# Patient Record
Sex: Male | Born: 1950
Health system: Southern US, Community
[De-identification: ages and names within clinical notes are randomized; demographics above are authoritative.]

## PROBLEM LIST (undated history)

## (undated) DIAGNOSIS — N200 Calculus of kidney: Secondary | ICD-10-CM

## (undated) DIAGNOSIS — I10 Essential (primary) hypertension: Secondary | ICD-10-CM

## (undated) DIAGNOSIS — Z87442 Personal history of urinary calculi: Secondary | ICD-10-CM

## (undated) DIAGNOSIS — B269 Mumps without complication: Secondary | ICD-10-CM

## (undated) DIAGNOSIS — B059 Measles without complication: Secondary | ICD-10-CM

## (undated) DIAGNOSIS — B019 Varicella without complication: Secondary | ICD-10-CM

## (undated) DIAGNOSIS — E119 Type 2 diabetes mellitus without complications: Secondary | ICD-10-CM

## (undated) DIAGNOSIS — I639 Cerebral infarction, unspecified: Secondary | ICD-10-CM

## (undated) HISTORY — DX: Measles without complication: B05.9

## (undated) HISTORY — PX: WISDOM TOOTH EXTRACTION: SHX21

## (undated) HISTORY — DX: Mumps without complication: B26.9

## (undated) HISTORY — DX: Varicella without complication: B01.9

## (undated) HISTORY — DX: Calculus of kidney: N20.0

## (undated) HISTORY — PX: COLONOSCOPY W/ POLYPECTOMY: SHX1380

---

## 2007-04-03 LAB — HM COLONOSCOPY: HM Colonoscopy: NORMAL

## 2013-10-16 ENCOUNTER — Ambulatory Visit (INDEPENDENT_AMBULATORY_CARE_PROVIDER_SITE_OTHER): Payer: BC Managed Care – PPO | Admitting: Physician Assistant

## 2013-10-16 ENCOUNTER — Encounter: Payer: Self-pay | Admitting: Physician Assistant

## 2013-10-16 VITALS — BP 138/100 | HR 75 | Temp 98.5°F | Resp 16 | Ht 73.5 in | Wt 219.2 lb

## 2013-10-16 DIAGNOSIS — Z23 Encounter for immunization: Secondary | ICD-10-CM | POA: Diagnosis not present

## 2013-10-16 DIAGNOSIS — N529 Male erectile dysfunction, unspecified: Secondary | ICD-10-CM

## 2013-10-16 DIAGNOSIS — Z0000 Encounter for general adult medical examination without abnormal findings: Secondary | ICD-10-CM

## 2013-10-16 LAB — LIPID PANEL
CHOL/HDL RATIO: 5.5 ratio
CHOLESTEROL: 210 mg/dL — AB (ref 0–200)
HDL: 38 mg/dL — AB (ref >39)
LDL Cholesterol: 124 mg/dL — ABNORMAL HIGH (ref 0–99)
Triglycerides: 239 mg/dL — ABNORMAL HIGH (ref <150)
VLDL: 48 mg/dL — AB (ref 0–40)

## 2013-10-16 LAB — BASIC METABOLIC PANEL WITH GFR
BUN: 22 mg/dL (ref 6–23)
CO2: 28 meq/L (ref 19–32)
Calcium: 9.4 mg/dL (ref 8.4–10.5)
Chloride: 103 meq/L (ref 96–112)
Creat: 1.13 mg/dL (ref 0.50–1.35)
Glucose, Bld: 102 mg/dL — ABNORMAL HIGH (ref 70–99)
Potassium: 4.5 meq/L (ref 3.5–5.3)
Sodium: 136 meq/L (ref 135–145)

## 2013-10-16 LAB — HEPATIC FUNCTION PANEL
ALBUMIN: 4.4 g/dL (ref 3.5–5.2)
ALT: 42 U/L (ref 0–53)
AST: 24 U/L (ref 0–37)
Alkaline Phosphatase: 79 U/L (ref 39–117)
BILIRUBIN DIRECT: 0.1 mg/dL (ref 0.0–0.3)
Indirect Bilirubin: 0.8 mg/dL (ref 0.2–1.2)
Total Bilirubin: 0.9 mg/dL (ref 0.2–1.2)
Total Protein: 6.9 g/dL (ref 6.0–8.3)

## 2013-10-16 LAB — TSH: TSH: 2.155 u[IU]/mL (ref 0.350–4.500)

## 2013-10-16 LAB — CBC
HCT: 43.8 % (ref 39.0–52.0)
Hemoglobin: 15.3 g/dL (ref 13.0–17.0)
MCH: 31.1 pg (ref 26.0–34.0)
MCHC: 34.9 g/dL (ref 30.0–36.0)
MCV: 89 fL (ref 78.0–100.0)
Platelets: 218 10*3/uL (ref 150–400)
RBC: 4.92 MIL/uL (ref 4.22–5.81)
RDW: 14.4 % (ref 11.5–15.5)
WBC: 6.5 10*3/uL (ref 4.0–10.5)

## 2013-10-16 LAB — HEMOGLOBIN A1C
Hgb A1c MFr Bld: 5.9 % — ABNORMAL HIGH (ref <5.7)
Mean Plasma Glucose: 123 mg/dL — ABNORMAL HIGH (ref <117)

## 2013-10-16 NOTE — Progress Notes (Signed)
Patient presents to clinic today to establish care. Patient requesting CPE.  Is fasting for labs.  Acute Concerns: Endorses difficulty maintaining erection that has come on gradually over the past several years.  Denies hx of hyperlipidemia or diabetes mellitus.  Denies hx of contracture or Peyronie disease.  Denies loss of libido.  Denies urinary hesitancy, dribbling or incomplete bladder emptying.  Has never been treated for this in the past.  Chronic Issues: Denies known significant PMH  Health Maintenance: Dental -- up-to-date Vision -- overdue Immunizations --  Tetanus overdue.  Will be getting immunization today. Colonoscopy -- 2007; no abnormal findings.  Due in 2017.   Past Medical History  Diagnosis Date  . Chicken pox   . Measles   . Mumps   . Kidney stones     5-6 times    Past Surgical History  Procedure Laterality Date  . Wisdom tooth extraction  1976-77    No current outpatient prescriptions on file prior to visit.   No current facility-administered medications on file prior to visit.    Allergies  Allergen Reactions  . Bee Venom     Lip swelling    Family History  Problem Relation Age of Onset  . Alcohol abuse Father 2    Deceased  . Heart disease Father   . Lung disease Father   . Stomach cancer Mother 69    Deceased  . Hypertension Father   . Hypertension Paternal Grandmother   . Dementia Paternal Grandmother   . Arthritis Maternal Grandmother   . Stroke Paternal Grandmother   . Sudden death Paternal Grandfather   . Sudden death Maternal Grandfather   . Heart attack Paternal Aunt   . Other Maternal Aunt     Natural Causes  . Healthy Son   . Anxiety disorder Daughter     History   Social History  . Marital Status: Married    Spouse Name: N/A    Number of Children: N/A  . Years of Education: N/A   Occupational History  . Not on file.   Social History Main Topics  . Smoking status: Never Smoker   . Smokeless tobacco: Never Used   . Alcohol Use: No  . Drug Use: No  . Sexual Activity: Yes   Other Topics Concern  . Not on file   Social History Narrative  . No narrative on file   Review of Systems  Constitutional: Negative for fever and weight loss.  HENT: Negative for ear discharge, ear pain, hearing loss and tinnitus.   Eyes: Negative for blurred vision, double vision, photophobia and pain.  Respiratory: Negative for cough and shortness of breath.   Cardiovascular: Negative for chest pain and palpitations.  Gastrointestinal: Negative for heartburn, nausea, vomiting, abdominal pain, diarrhea, constipation, blood in stool and melena.  Genitourinary: Negative for dysuria, urgency, frequency, hematuria and flank pain.       Nocturia x 1.  Neurological: Negative for dizziness, loss of consciousness and headaches.  Endo/Heme/Allergies: Negative for environmental allergies.  Psychiatric/Behavioral: Negative for depression, suicidal ideas, hallucinations and substance abuse. The patient is not nervous/anxious and does not have insomnia.    BP 138/100  Pulse 75  Temp(Src) 98.5 F (36.9 C) (Oral)  Resp 16  Ht 6' 1.5" (1.867 m)  Wt 219 lb 4 oz (99.451 kg)  BMI 28.53 kg/m2  SpO2 98%  Physical Exam  Vitals reviewed. Constitutional: He is oriented to person, place, and time and well-developed, well-nourished, and in no distress.  HENT:  Head: Normocephalic and atraumatic.  Right Ear: External ear normal.  Left Ear: External ear normal.  Nose: Nose normal.  Mouth/Throat: Oropharynx is clear and moist. No oropharyngeal exudate.  TM within normal limits bilaterally.  Eyes: Conjunctivae are normal. Pupils are equal, round, and reactive to light.  Neck: Neck supple.  Cardiovascular: Normal rate, regular rhythm, normal heart sounds and intact distal pulses.   Pulmonary/Chest: Effort normal and breath sounds normal. No respiratory distress. He has no wheezes. He has no rales. He exhibits no tenderness.  Abdominal:  Soft. Bowel sounds are normal. He exhibits no distension and no mass. There is no tenderness. There is no rebound and no guarding.  Genitourinary: Penis normal.  Neurological: He is alert and oriented to person, place, and time.  Skin: Skin is warm and dry. No rash noted.  Psychiatric: Affect normal.   Assessment/Plan: Visit for preventive health examination Medical history reviewed.  Tetanus updated.  Will obtain fasting lab work.  Need for prophylactic vaccination with combined diphtheria-tetanus-pertussis (DTP) vaccine Immunization given by nursing staff.  Erectile dysfunction Will obtain labs to include BMP, PSA, A1C, Lipid panel and testosterone level.  Patient wishes to defer treatment until workup is complete.

## 2013-10-16 NOTE — Assessment & Plan Note (Signed)
Immunization given by nursing staff. 

## 2013-10-16 NOTE — Assessment & Plan Note (Signed)
Medical history reviewed.  Tetanus updated.  Will obtain fasting lab work.

## 2013-10-16 NOTE — Progress Notes (Signed)
Pre visit review using our clinic review tool, if applicable. No additional management support is needed unless otherwise documented below in the visit note/SLS  

## 2013-10-16 NOTE — Assessment & Plan Note (Signed)
Will obtain labs to include BMP, PSA, A1C, Lipid panel and testosterone level.  Patient wishes to defer treatment until workup is complete.

## 2013-10-16 NOTE — Patient Instructions (Addendum)
Please obtain labs.  I will call you with your results.  We will treat you according to lab results.  Please continue exercise.  Try to eat a well-balanced diet.  Please read information below on Preventive Care for Men.  Also read information below on the DASH diet which will help blood pressure, as your BP is elevated in clinic today.  This could be due to anxiety of the first visit, but we will need to monitor this.  Recommend you come back in to office in 1 month for a BP recheck with the nurse.   Preventive Care for Adults A healthy lifestyle and preventive care can promote health and wellness. Preventive health guidelines for men include the following key practices:  A routine yearly physical is a good way to check with your health care provider about your health and preventative screening. It is a chance to share any concerns and updates on your health and to receive a thorough exam.  Visit your dentist for a routine exam and preventative care every 6 months. Brush your teeth twice a day and floss once a day. Good oral hygiene prevents tooth decay and gum disease.  The frequency of eye exams is based on your age, health, family medical history, use of contact lenses, and other factors. Follow your health care provider's recommendations for frequency of eye exams.  Eat a healthy diet. Foods such as vegetables, fruits, whole grains, low-fat dairy products, and lean protein foods contain the nutrients you need without too many calories. Decrease your intake of foods high in solid fats, added sugars, and salt. Eat the right amount of calories for you.Get information about a proper diet from your health care provider, if necessary.  Regular physical exercise is one of the most important things you can do for your health. Most adults should get at least 150 minutes of moderate-intensity exercise (any activity that increases your heart rate and causes you to sweat) each week. In addition, most adults  need muscle-strengthening exercises on 2 or more days a week.  Maintain a healthy weight. The body mass index (BMI) is a screening tool to identify possible weight problems. It provides an estimate of body fat based on height and weight. Your health care provider can find your BMI and can help you achieve or maintain a healthy weight.For adults 20 years and older:  A BMI below 18.5 is considered underweight.  A BMI of 18.5 to 24.9 is normal.  A BMI of 25 to 29.9 is considered overweight.  A BMI of 30 and above is considered obese.  Maintain normal blood lipids and cholesterol levels by exercising and minimizing your intake of saturated fat. Eat a balanced diet with plenty of fruit and vegetables. Blood tests for lipids and cholesterol should begin at age 6 and be repeated every 5 years. If your lipid or cholesterol levels are high, you are over 50, or you are at high risk for heart disease, you may need your cholesterol levels checked more frequently.Ongoing high lipid and cholesterol levels should be treated with medicines if diet and exercise are not working.  If you smoke, find out from your health care provider how to quit. If you do not use tobacco, do not start.  Lung cancer screening is recommended for adults aged 53-80 years who are at high risk for developing lung cancer because of a history of smoking. A yearly low-dose CT scan of the lungs is recommended for people who have at least a  30-pack-year history of smoking and are a current smoker or have quit within the past 15 years. A pack year of smoking is smoking an average of 1 pack of cigarettes a day for 1 year (for example: 1 pack a day for 30 years or 2 packs a day for 15 years). Yearly screening should continue until the smoker has stopped smoking for at least 15 years. Yearly screening should be stopped for people who develop a health problem that would prevent them from having lung cancer treatment.  If you choose to drink  alcohol, do not have more than 2 drinks per day. One drink is considered to be 12 ounces (355 mL) of beer, 5 ounces (148 mL) of wine, or 1.5 ounces (44 mL) of liquor.  Avoid use of street drugs. Do not share needles with anyone. Ask for help if you need support or instructions about stopping the use of drugs.  High blood pressure causes heart disease and increases the risk of stroke. Your blood pressure should be checked at least every 1-2 years. Ongoing high blood pressure should be treated with medicines, if weight loss and exercise are not effective.  If you are 61-27 years old, ask your health care provider if you should take aspirin to prevent heart disease.  Diabetes screening involves taking a blood sample to check your fasting blood sugar level. This should be done once every 3 years, after age 59, if you are within normal weight and without risk factors for diabetes. Testing should be considered at a younger age or be carried out more frequently if you are overweight and have at least 1 risk factor for diabetes.  Colorectal cancer can be detected and often prevented. Most routine colorectal cancer screening begins at the age of 64 and continues through age 56. However, your health care provider may recommend screening at an earlier age if you have risk factors for colon cancer. On a yearly basis, your health care provider may provide home test kits to check for hidden blood in the stool. Use of a small camera at the end of a tube to directly examine the colon (sigmoidoscopy or colonoscopy) can detect the earliest forms of colorectal cancer. Talk to your health care provider about this at age 32, when routine screening begins. Direct exam of the colon should be repeated every 5-10 years through age 22, unless early forms of precancerous polyps or small growths are found.  People who are at an increased risk for hepatitis B should be screened for this virus. You are considered at high risk for  hepatitis B if:  You were born in a country where hepatitis B occurs often. Talk with your health care provider about which countries are considered high risk.  Your parents were born in a high-risk country and you have not received a shot to protect against hepatitis B (hepatitis B vaccine).  You have HIV or AIDS.  You use needles to inject street drugs.  You live with, or have sex with, someone who has hepatitis B.  You are a man who has sex with other men (MSM).  You get hemodialysis treatment.  You take certain medicines for conditions such as cancer, organ transplantation, and autoimmune conditions.  Hepatitis C blood testing is recommended for all people born from [birth year] through 1965 and any individual with known risks for hepatitis C.  Practice safe sex. Use condoms and avoid high-risk sexual practices to reduce the spread of sexually transmitted infections (STIs). STIs include  gonorrhea, chlamydia, syphilis, trichomonas, herpes, HPV, and human immunodeficiency virus (HIV). Herpes, HIV, and HPV are viral illnesses that have no cure. They can result in disability, cancer, and death.  If you are at risk of being infected with HIV, it is recommended that you take a prescription medicine daily to prevent HIV infection. This is called preexposure prophylaxis (PrEP). You are considered at risk if:  You are a man who has sex with other men (MSM) and have other risk factors.  You are a heterosexual man, are sexually active, and are at increased risk for HIV infection.  You take drugs by injection.  You are sexually active with a partner who has HIV.  Talk with your health care provider about whether you are at high risk of being infected with HIV. If you choose to begin PrEP, you should first be tested for HIV. You should then be tested every 3 months for as long as you are taking PrEP.  A one-time screening for abdominal aortic aneurysm (AAA) and surgical repair of large AAAs by  ultrasound are recommended for men ages 43 to 67 years who are current or former smokers.  Healthy men should no longer receive prostate-specific antigen (PSA) blood tests as part of routine cancer screening. Talk with your health care provider about prostate cancer screening.  Testicular cancer screening is not recommended for adult males who have no symptoms. Screening includes self-exam, a health care provider exam, and other screening tests. Consult with your health care provider about any symptoms you have or any concerns you have about testicular cancer.  Use sunscreen. Apply sunscreen liberally and repeatedly throughout the day. You should seek shade when your shadow is shorter than you. Protect yourself by wearing long sleeves, pants, a wide-brimmed hat, and sunglasses year round, whenever you are outdoors.  Once a month, do a whole-body skin exam, using a mirror to look at the skin on your back. Tell your health care provider about new moles, moles that have irregular borders, moles that are larger than a pencil eraser, or moles that have changed in shape or color.  Stay current with required vaccines (immunizations).  Influenza vaccine. All adults should be immunized every year.  Tetanus, diphtheria, and acellular pertussis (Td, Tdap) vaccine. An adult who has not previously received Tdap or who does not know his vaccine status should receive 1 dose of Tdap. This initial dose should be followed by tetanus and diphtheria toxoids (Td) booster doses every 10 years. Adults with an unknown or incomplete history of completing a 3-dose immunization series with Td-containing vaccines should begin or complete a primary immunization series including a Tdap dose. Adults should receive a Td booster every 10 years.  Varicella vaccine. An adult without evidence of immunity to varicella should receive 2 doses or a second dose if he has previously received 1 dose.  Human papillomavirus (HPV) vaccine.  Males aged 4-21 years who have not received the vaccine previously should receive the 3-dose series. Males aged 22-26 years may be immunized. Immunization is recommended through the age of 48 years for any male who has sex with males and did not get any or all doses earlier. Immunization is recommended for any person with an immunocompromised condition through the age of 24 years if he did not get any or all doses earlier. During the 3-dose series, the second dose should be obtained 4-8 weeks after the first dose. The third dose should be obtained 24 weeks after the first dose and  16 weeks after the second dose.  Zoster vaccine. One dose is recommended for adults aged 24 years or older unless certain conditions are present.  Measles, mumps, and rubella (MMR) vaccine. Adults born before [birth year] generally are considered immune to measles and mumps. Adults born in [birth year] or later should have 1 or more doses of MMR vaccine unless there is a contraindication to the vaccine or there is laboratory evidence of immunity to each of the three diseases. A routine second dose of MMR vaccine should be obtained at least 28 days after the first dose for students attending postsecondary schools, health care workers, or international travelers. People who received inactivated measles vaccine or an unknown type of measles vaccine during 1963-1967 should receive 2 doses of MMR vaccine. People who received inactivated mumps vaccine or an unknown type of mumps vaccine before 1979 and are at high risk for mumps infection should consider immunization with 2 doses of MMR vaccine. Unvaccinated health care workers born before [birth year] who lack laboratory evidence of measles, mumps, or rubella immunity or laboratory confirmation of disease should consider measles and mumps immunization with 2 doses of MMR vaccine or rubella immunization with 1 dose of MMR vaccine.  Pneumococcal 13-valent conjugate (PCV13) vaccine. When indicated, a person who is  uncertain of his immunization history and has no record of immunization should receive the PCV13 vaccine. An adult aged 24 years or older who has certain medical conditions and has not been previously immunized should receive 1 dose of PCV13 vaccine. This PCV13 should be followed with a dose of pneumococcal polysaccharide (PPSV23) vaccine. The PPSV23 vaccine dose should be obtained at least 8 weeks after the dose of PCV13 vaccine. An adult aged 47 years or older who has certain medical conditions and previously received 1 or more doses of PPSV23 vaccine should receive 1 dose of PCV13. The PCV13 vaccine dose should be obtained 1 or more years after the last PPSV23 vaccine dose.  Pneumococcal polysaccharide (PPSV23) vaccine. When PCV13 is also indicated, PCV13 should be obtained first. All adults aged 44 years and older should be immunized. An adult younger than age 89 years who has certain medical conditions should be immunized. Any person who resides in a nursing home or long-term care facility should be immunized. An adult smoker should be immunized. People with an immunocompromised condition and certain other conditions should receive both PCV13 and PPSV23 vaccines. People with human immunodeficiency virus (HIV) infection should be immunized as soon as possible after diagnosis. Immunization during chemotherapy or radiation therapy should be avoided. Routine use of PPSV23 vaccine is not recommended for American Indians, [place] Natives, or people younger than 65 years unless there are medical conditions that require PPSV23 vaccine. When indicated, people who have unknown immunization and have no record of immunization should receive PPSV23 vaccine. One-time revaccination 5 years after the first dose of PPSV23 is recommended for people aged 19-64 years who have chronic kidney failure, nephrotic syndrome, asplenia, or immunocompromised conditions. People who received 1-2 doses of PPSV23 before age 63 years should  receive another dose of PPSV23 vaccine at age 11 years or later if at least 5 years have passed since the previous dose. Doses of PPSV23 are not needed for people immunized with PPSV23 at or after age 3 years.  Meningococcal vaccine. Adults with asplenia or persistent complement component deficiencies should receive 2 doses of quadrivalent meningococcal conjugate (MenACWY-D) vaccine. The doses should be obtained at least 2 months apart. Microbiologists working with certain meningococcal bacteria,  [employer] recruits, people at risk during an outbreak, and people who travel to or live in countries with a high rate of meningitis should be immunized. A first-year college student up through age 20 years who is living in a residence hall should receive a dose if he did not receive a dose on or after his 16th birthday. Adults who have certain high-risk conditions should receive one or more doses of vaccine.  Hepatitis A vaccine. Adults who wish to be protected from this disease, have certain high-risk conditions, work with hepatitis A-infected animals, work in hepatitis A research labs, or travel to or work in countries with a high rate of hepatitis A should be immunized. Adults who were previously unvaccinated and who anticipate close contact with an international adoptee during the first 60 days after arrival in the [place] from a country with a high rate of hepatitis A should be immunized.  Hepatitis B vaccine. Adults should be immunized if they wish to be protected from this disease, have certain high-risk conditions, may be exposed to blood or other infectious body fluids, are household contacts or sex partners of hepatitis B positive people, are clients or workers in certain care facilities, or travel to or work in countries with a high rate of hepatitis B.  Haemophilus influenzae type b (Hib) vaccine. A previously unvaccinated person with asplenia or sickle cell disease or having a scheduled  splenectomy should receive 1 dose of Hib vaccine. Regardless of previous immunization, a recipient of a hematopoietic stem cell transplant should receive a 3-dose series 6-12 months after his successful transplant. Hib vaccine is not recommended for adults with HIV infection. Preventive Service / Frequency Ages 55 to 89  Blood pressure check.** / Every 1 to 2 years.  Lipid and cholesterol check.** / Every 5 years beginning at age 60.  Hepatitis C blood test.** / For any individual with known risks for hepatitis C.  Skin self-exam. / Monthly.  Influenza vaccine. / Every year.  Tetanus, diphtheria, and acellular pertussis (Tdap, Td) vaccine.** / Consult your health care provider. 1 dose of Td every 10 years.  Varicella vaccine.** / Consult your health care provider.  HPV vaccine. / 3 doses over 6 months, if 72 or younger.  Measles, mumps, rubella (MMR) vaccine.** / You need at least 1 dose of MMR if you were born in 1957 or later. You may also need a second dose.  Pneumococcal 13-valent conjugate (PCV13) vaccine.** / Consult your health care provider.  Pneumococcal polysaccharide (PPSV23) vaccine.** / 1 to 2 doses if you smoke cigarettes or if you have certain conditions.  Meningococcal vaccine.** / 1 dose if you are age 85 to 88 years and a Market researcher living in a residence hall, or have one of several medical conditions. You may also need additional booster doses.  Hepatitis A vaccine.** / Consult your health care provider.  Hepatitis B vaccine.** / Consult your health care provider.  Haemophilus influenzae type b (Hib) vaccine.** / Consult your health care provider. Ages 76 to 40  Blood pressure check.** / Every 1 to 2 years.  Lipid and cholesterol check.** / Every 5 years beginning at age 19.  Lung cancer screening. / Every year if you are aged 73-80 years and have a 30-pack-year history of smoking and currently smoke or have quit within the past 15 years.  Yearly screening is stopped once you have quit smoking for at least 15 years or develop a health problem that would prevent you from  having lung cancer treatment.  Fecal occult blood test (FOBT) of stool. / Every year beginning at age 56 and continuing until age 53. You may not have to do this test if you get a colonoscopy every 10 years.  Flexible sigmoidoscopy** or colonoscopy.** / Every 5 years for a flexible sigmoidoscopy or every 10 years for a colonoscopy beginning at age 61 and continuing until age 58.  Hepatitis C blood test.** / For all people born from [birth year] through 1964 and any individual with known risks for hepatitis C.  Skin self-exam. / Monthly.  Influenza vaccine. / Every year.  Tetanus, diphtheria, and acellular pertussis (Tdap/Td) vaccine.** / Consult your health care provider. 1 dose of Td every 10 years.  Varicella vaccine.** / Consult your health care provider.  Zoster vaccine.** / 1 dose for adults aged 45 years or older.  Measles, mumps, rubella (MMR) vaccine.** / You need at least 1 dose of MMR if you were born in 1956 or later. You may also need a second dose.  Pneumococcal 13-valent conjugate (PCV13) vaccine.** / Consult your health care provider.  Pneumococcal polysaccharide (PPSV23) vaccine.** / 1 to 2 doses if you smoke cigarettes or if you have certain conditions.  Meningococcal vaccine.** / Consult your health care provider.  Hepatitis A vaccine.** / Consult your health care provider.  Hepatitis B vaccine.** / Consult your health care provider.  Haemophilus influenzae type b (Hib) vaccine.** / Consult your health care provider. Ages 52 and over  Blood pressure check.** / Every 1 to 2 years.  Lipid and cholesterol check.**/ Every 5 years beginning at age 67.  Lung cancer screening. / Every year if you are aged 36-80 years and have a 30-pack-year history of smoking and currently smoke or have quit within the past 15 years. Yearly screening is stopped  once you have quit smoking for at least 15 years or develop a health problem that would prevent you from having lung cancer treatment.  Fecal occult blood test (FOBT) of stool. / Every year beginning at age 27 and continuing until age 23. You may not have to do this test if you get a colonoscopy every 10 years.  Flexible sigmoidoscopy** or colonoscopy.** / Every 5 years for a flexible sigmoidoscopy or every 10 years for a colonoscopy beginning at age 50 and continuing until age 64.  Hepatitis C blood test.** / For all people born from [birth year] through 1964 and any individual with known risks for hepatitis C.  Abdominal aortic aneurysm (AAA) screening.** / A one-time screening for ages 53 to 2 years who are current or former smokers.  Skin self-exam. / Monthly.  Influenza vaccine. / Every year.  Tetanus, diphtheria, and acellular pertussis (Tdap/Td) vaccine.** / 1 dose of Td every 10 years.  Varicella vaccine.** / Consult your health care provider.  Zoster vaccine.** / 1 dose for adults aged 36 years or older.  Pneumococcal 13-valent conjugate (PCV13) vaccine.** / Consult your health care provider.  Pneumococcal polysaccharide (PPSV23) vaccine.** / 1 dose for all adults aged 66 years and older.  Meningococcal vaccine.** / Consult your health care provider.  Hepatitis A vaccine.** / Consult your health care provider.  Hepatitis B vaccine.** / Consult your health care provider.  Haemophilus influenzae type b (Hib) vaccine.** / Consult your health care provider. **Family history and personal history of risk and conditions may change your health care provider's recommendations. Document Released: 05/15/2001 Document Revised: 03/24/2013 Document Reviewed: 08/14/2010 [facility] Patient Information 2015 [place], [place]. This information is not  intended to replace advice given to you by your health care provider. Make sure you discuss any questions you have with your health care provider.  DASH  Eating Plan DASH stands for "Dietary Approaches to Stop Hypertension." The DASH eating plan is a healthy eating plan that has been shown to reduce high blood pressure (hypertension). Additional health benefits may include reducing the risk of type 2 diabetes mellitus, heart disease, and stroke. The DASH eating plan may also help with weight loss. WHAT DO I NEED TO KNOW ABOUT THE DASH EATING PLAN? For the DASH eating plan, you will follow these general guidelines:  Choose foods with a percent daily value for sodium of less than 5% (as listed on the food label).  Use salt-free seasonings or herbs instead of table salt or sea salt.  Check with your health care provider or pharmacist before using salt substitutes.  Eat lower-sodium products, often labeled as "lower sodium" or "no salt added."  Eat fresh foods.  Eat more vegetables, fruits, and low-fat dairy products.  Choose whole grains. Look for the word "whole" as the first word in the ingredient list.  Choose fish and skinless chicken or [place] more often than red meat. Limit fish, poultry, and meat to 6 oz (170 g) each day.  Limit sweets, desserts, sugars, and sugary drinks.  Choose heart-healthy fats.  Limit cheese to 1 oz (28 g) per day.  Eat more home-cooked food and less restaurant, buffet, and fast food.  Limit fried foods.  Cook foods using methods other than frying.  Limit canned vegetables. If you do use them, rinse them well to decrease the sodium.  When eating at a restaurant, ask that your food be prepared with less salt, or no salt if possible. WHAT FOODS CAN I EAT? Seek help from a dietitian for individual calorie needs. Grains Whole grain or whole wheat bread. Brown rice. Whole grain or whole wheat pasta. Quinoa, bulgur, and whole grain cereals. Low-sodium cereals. Corn or whole wheat flour tortillas. Whole grain cornbread. Whole grain crackers. Low-sodium crackers. Vegetables Fresh or frozen vegetables (raw,  steamed, roasted, or grilled). Low-sodium or reduced-sodium tomato and vegetable juices. Low-sodium or reduced-sodium tomato sauce and paste. Low-sodium or reduced-sodium canned vegetables.  Fruits All fresh, canned (in natural juice), or frozen fruits. Meat and Other Protein Products Ground beef (85% or leaner), grass-fed beef, or beef trimmed of fat. Skinless chicken or [place]. Ground chicken or [place]. Pork trimmed of fat. All fish and seafood. Eggs. Dried beans, peas, or lentils. Unsalted nuts and seeds. Unsalted canned beans. Dairy Low-fat dairy products, such as skim or 1% milk, 2% or reduced-fat cheeses, low-fat ricotta or cottage cheese, or plain low-fat yogurt. Low-sodium or reduced-sodium cheeses. Fats and Oils Tub margarines without trans fats. Light or reduced-fat mayonnaise and salad dressings (reduced sodium). Avocado. Safflower, olive, or canola oils. Natural peanut or almond butter. Other Unsalted popcorn and pretzels. The items listed above may not be a complete list of recommended foods or beverages. Contact your dietitian for more options. WHAT FOODS ARE NOT RECOMMENDED? Grains White bread. White pasta. White rice. Refined cornbread. Bagels and croissants. Crackers that contain trans fat. Vegetables Creamed or fried vegetables. Vegetables in a cheese sauce. Regular canned vegetables. Regular canned tomato sauce and paste. Regular tomato and vegetable juices. Fruits Dried fruits. Canned fruit in light or heavy syrup. Fruit juice. Meat and Other Protein Products Fatty cuts of meat. Ribs, chicken wings, bacon, sausage, bologna, salami, chitterlings, fatback, hot dogs, bratwurst,  and packaged luncheon meats. Salted nuts and seeds. Canned beans with salt. Dairy Whole or 2% milk, cream, half-and-half, and cream cheese. Whole-fat or sweetened yogurt. Full-fat cheeses or blue cheese. Nondairy creamers and whipped toppings. Processed cheese, cheese spreads, or cheese  curds. Condiments Onion and garlic salt, seasoned salt, table salt, and sea salt. Canned and packaged gravies. Worcestershire sauce. Tartar sauce. Barbecue sauce. Teriyaki sauce. Soy sauce, including reduced sodium. Steak sauce. Fish sauce. Oyster sauce. Cocktail sauce. Horseradish. Ketchup and mustard. Meat flavorings and tenderizers. Bouillon cubes. Hot sauce. Tabasco sauce. Marinades. Taco seasonings. Relishes. Fats and Oils Butter, stick margarine, lard, shortening, ghee, and bacon fat. Coconut, palm kernel, or palm oils. Regular salad dressings. Other Pickles and olives. Salted popcorn and pretzels. The items listed above may not be a complete list of foods and beverages to avoid. Contact your dietitian for more information. WHERE CAN I FIND MORE INFORMATION? National Heart, Lung, and Blood Institute: travelstabloid.com Document Released: 03/08/2011 Document Revised: 03/24/2013 Document Reviewed: 01/21/2013 [facility] Patient Information 2015 [place], [place]. This information is not intended to replace advice given to you by your health care provider. Make sure you discuss any questions you have with your health care provider.

## 2013-10-17 LAB — URINALYSIS, ROUTINE W REFLEX MICROSCOPIC
Bilirubin Urine: NEGATIVE
GLUCOSE, UA: NEGATIVE mg/dL
Hgb urine dipstick: NEGATIVE
Ketones, ur: NEGATIVE mg/dL
LEUKOCYTES UA: NEGATIVE
Nitrite: NEGATIVE
PROTEIN: NEGATIVE mg/dL
Urobilinogen, UA: 0.2 mg/dL (ref 0.0–1.0)
pH: 5 (ref 5.0–8.0)

## 2013-10-17 LAB — PSA: PSA: 1.74 ng/mL (ref <=4.00)

## 2013-10-17 LAB — URINALYSIS, MICROSCOPIC ONLY
Bacteria, UA: NONE SEEN
Casts: NONE SEEN
Crystals: NONE SEEN
SQUAMOUS EPITHELIAL / LPF: NONE SEEN

## 2013-10-19 LAB — TESTOSTERONE, FREE, TOTAL, SHBG
Sex Hormone Binding: 23 nmol/L (ref 13–71)
TESTOSTERONE-% FREE: 2.3 % (ref 1.6–2.9)
Testosterone, Free: 50.9 pg/mL (ref 47.0–244.0)
Testosterone: 219 ng/dL — ABNORMAL LOW (ref 300–890)

## 2013-10-20 ENCOUNTER — Telehealth: Payer: Self-pay | Admitting: *Deleted

## 2013-10-20 DIAGNOSIS — E781 Pure hyperglyceridemia: Secondary | ICD-10-CM

## 2013-10-20 DIAGNOSIS — R7989 Other specified abnormal findings of blood chemistry: Secondary | ICD-10-CM

## 2013-10-20 NOTE — Telephone Encounter (Signed)
Message copied by Ronny Flurry on Tue Oct 20, 2013  5:12 PM ------      Message from: Raiford Noble      Created: Sun Oct 18, 2013  3:27 PM       Labs look good overall.  Still waiting on testosterone results.  His cholesterol is mildly elevated.  LDL is just slightly raised, not overly concerning, but his TGL are too high.  He needs to begin taking a fish oil supplement daily. Also needs to limit intake of foods high in cholesterol, saturated fats and refined sugars.  Decrease/limit alcohol intake.  Will recheck TGL levels in one month.  If they stay persistently elevated, he will need a Rx medication. ------

## 2013-10-20 NOTE — Telephone Encounter (Signed)
Notified pt and he voices understanding. Pt has nurse visit BP check on 11/18/13 and will repeat lipid panel at that time (order entered). Pt will return to lab in the am to repeat testosterone. Lab order entered.   Notes Recorded by Leeanne Rio, PA-C on 10/19/2013 at 11:40 AM Testosterone is low. Will need repeat specimen to confirm. Recommend he come to lab one AM (8AM is best) to recheck labs. May be contributing to his symptoms.

## 2013-10-22 LAB — TESTOSTERONE, FREE, TOTAL, SHBG
SEX HORMONE BINDING: 25 nmol/L (ref 13–71)
TESTOSTERONE: 216 ng/dL — AB (ref 300–890)
Testosterone, Free: 48.1 pg/mL (ref 47.0–244.0)
Testosterone-% Free: 2.2 % (ref 1.6–2.9)

## 2013-11-18 ENCOUNTER — Ambulatory Visit (INDEPENDENT_AMBULATORY_CARE_PROVIDER_SITE_OTHER): Payer: BC Managed Care – PPO | Admitting: *Deleted

## 2013-11-18 VITALS — BP 132/100

## 2013-11-18 DIAGNOSIS — IMO0001 Reserved for inherently not codable concepts without codable children: Secondary | ICD-10-CM

## 2013-11-18 DIAGNOSIS — R030 Elevated blood-pressure reading, without diagnosis of hypertension: Secondary | ICD-10-CM

## 2013-11-18 NOTE — Progress Notes (Signed)
   Subjective:    Patient ID: Micheal Oliver, male    DOB: 1950/10/28, 63 y.o.   MRN: [id number]  HPI    Review of Systems     Objective:   Physical Exam        Assessment & Plan:  Pt presented for one month F/U on Blood Pressure. Blood pressure was taken with large size cuff. Patient still has Hypertension level readings; numbers forwarded to provider. Provider instructions were 1) Start Lisinopril 10 mg daily 2) Start DASH Diet, strict adherence with added activity level daily and return OV in [1] month. Patient declines to start medication now, will use DASH diet guidelines and is leaving to go to [place] for two months next week, where he will have ample opportunity for activity [bike, etc] as opposed to not doing any here; he will schedule F/U OV for when he returns from [place] in [2] months to assess BP; provider informed/SLS

## 2014-01-29 ENCOUNTER — Ambulatory Visit (INDEPENDENT_AMBULATORY_CARE_PROVIDER_SITE_OTHER): Payer: BC Managed Care – PPO | Admitting: Physician Assistant

## 2014-01-29 ENCOUNTER — Encounter: Payer: Self-pay | Admitting: Physician Assistant

## 2014-01-29 ENCOUNTER — Ambulatory Visit (HOSPITAL_BASED_OUTPATIENT_CLINIC_OR_DEPARTMENT_OTHER)
Admission: RE | Admit: 2014-01-29 | Discharge: 2014-01-29 | Disposition: A | Discharge status: Home / Self Care | Payer: BC Managed Care – PPO | Source: Ambulatory Visit | Attending: Radiology | Admitting: Radiology

## 2014-01-29 VITALS — BP 165/99 | HR 70 | Temp 98.7°F | Resp 16 | Ht 73.5 in | Wt 218.1 lb

## 2014-01-29 DIAGNOSIS — I10 Essential (primary) hypertension: Secondary | ICD-10-CM

## 2014-01-29 DIAGNOSIS — K5900 Constipation, unspecified: Secondary | ICD-10-CM | POA: Insufficient documentation

## 2014-01-29 MED ORDER — LISINOPRIL 10 MG PO TABS: 10.0000 mg | ORAL_TABLET | Freq: Every day | ORAL | Status: DC

## 2014-01-29 NOTE — Patient Instructions (Signed)
Please go downstairs for x-ray of your abdomen.  This will help assess your stool burden and r/o kidney stone.  Increase your fluid intake.  Take a stool softener daily over the next week. Use Milk of Magnesia or Miralax over the next couple of days.  After that, only use if no bowel movement in 2 days.   For Blood pressure, please take the Lisinopril daily as directed.  Read information below on the DASH diet.  Follow-up with me in 2 weeks for a BP recheck.  DASH Eating Plan DASH stands for "Dietary Approaches to Stop Hypertension." The DASH eating plan is a healthy eating plan that has been shown to reduce high blood pressure (hypertension). Additional health benefits may include reducing the risk of type 2 diabetes mellitus, heart disease, and stroke. The DASH eating plan may also help with weight loss. WHAT DO I NEED TO KNOW ABOUT THE DASH EATING PLAN? For the DASH eating plan, you will follow these general guidelines:  Choose foods with a percent daily value for sodium of less than 5% (as listed on the food label).  Use salt-free seasonings or herbs instead of table salt or sea salt.  Check with your health care provider or pharmacist before using salt substitutes.  Eat lower-sodium products, often labeled as "lower sodium" or "no salt added."  Eat fresh foods.  Eat more vegetables, fruits, and low-fat dairy products.  Choose whole grains. Look for the word "whole" as the first word in the ingredient list.  Choose fish and skinless chicken or [place] more often than red meat. Limit fish, poultry, and meat to 6 oz (170 g) each day.  Limit sweets, desserts, sugars, and sugary drinks.  Choose heart-healthy fats.  Limit cheese to 1 oz (28 g) per day.  Eat more home-cooked food and less restaurant, buffet, and fast food.  Limit fried foods.  Cook foods using methods other than frying.  Limit canned vegetables. If you do use them, rinse them well to decrease the sodium.  When  eating at a restaurant, ask that your food be prepared with less salt, or no salt if possible. WHAT FOODS CAN I EAT? Seek help from a dietitian for individual calorie needs. Grains Whole grain or whole wheat bread. Brown rice. Whole grain or whole wheat pasta. Quinoa, bulgur, and whole grain cereals. Low-sodium cereals. Corn or whole wheat flour tortillas. Whole grain cornbread. Whole grain crackers. Low-sodium crackers. Vegetables Fresh or frozen vegetables (raw, steamed, roasted, or grilled). Low-sodium or reduced-sodium tomato and vegetable juices. Low-sodium or reduced-sodium tomato sauce and paste. Low-sodium or reduced-sodium canned vegetables.  Fruits All fresh, canned (in natural juice), or frozen fruits. Meat and Other Protein Products Ground beef (85% or leaner), grass-fed beef, or beef trimmed of fat. Skinless chicken or [place]. Ground chicken or [place]. Pork trimmed of fat. All fish and seafood. Eggs. Dried beans, peas, or lentils. Unsalted nuts and seeds. Unsalted canned beans. Dairy Low-fat dairy products, such as skim or 1% milk, 2% or reduced-fat cheeses, low-fat ricotta or cottage cheese, or plain low-fat yogurt. Low-sodium or reduced-sodium cheeses. Fats and Oils Tub margarines without trans fats. Light or reduced-fat mayonnaise and salad dressings (reduced sodium). Avocado. Safflower, olive, or canola oils. Natural peanut or almond butter. Other Unsalted popcorn and pretzels. The items listed above may not be a complete list of recommended foods or beverages. Contact your dietitian for more options. WHAT FOODS ARE NOT RECOMMENDED? Grains White bread. White pasta. White rice. Refined cornbread. Bagels  and croissants. Crackers that contain trans fat. Vegetables Creamed or fried vegetables. Vegetables in a cheese sauce. Regular canned vegetables. Regular canned tomato sauce and paste. Regular tomato and vegetable juices. Fruits Dried fruits. Canned fruit in light or heavy  syrup. Fruit juice. Meat and Other Protein Products Fatty cuts of meat. Ribs, chicken wings, bacon, sausage, bologna, salami, chitterlings, fatback, hot dogs, bratwurst, and packaged luncheon meats. Salted nuts and seeds. Canned beans with salt. Dairy Whole or 2% milk, cream, half-and-half, and cream cheese. Whole-fat or sweetened yogurt. Full-fat cheeses or blue cheese. Nondairy creamers and whipped toppings. Processed cheese, cheese spreads, or cheese curds. Condiments Onion and garlic salt, seasoned salt, table salt, and sea salt. Canned and packaged gravies. Worcestershire sauce. Tartar sauce. Barbecue sauce. Teriyaki sauce. Soy sauce, including reduced sodium. Steak sauce. Fish sauce. Oyster sauce. Cocktail sauce. Horseradish. Ketchup and mustard. Meat flavorings and tenderizers. Bouillon cubes. Hot sauce. Tabasco sauce. Marinades. Taco seasonings. Relishes. Fats and Oils Butter, stick margarine, lard, shortening, ghee, and bacon fat. Coconut, palm kernel, or palm oils. Regular salad dressings. Other Pickles and olives. Salted popcorn and pretzels. The items listed above may not be a complete list of foods and beverages to avoid. Contact your dietitian for more information. WHERE CAN I FIND MORE INFORMATION? National Heart, Lung, and Blood Institute: travelstabloid.com Document Released: 03/08/2011 Document Revised: 08/03/2013 Document Reviewed: 01/21/2013 [facility] Patient Information 2015 [place], [place]. This information is not intended to replace advice given to you by your health care provider. Make sure you discuss any questions you have with your health care provider.

## 2014-01-29 NOTE — Progress Notes (Signed)
Pre visit review using our clinic review tool, if applicable. No additional management support is needed unless otherwise documented below in the visit note/SLS  

## 2014-01-29 NOTE — Assessment & Plan Note (Signed)
Repeat BP 160/90.  Likely partially related to pain, but patient with several previously elevated BP.  Will begin lisinopril 10 mg daily.  Common ADRs discussed with patient.  Follow-up 2-4 weeks for BP recheck.

## 2014-01-29 NOTE — Progress Notes (Signed)
Patient presents to clinic today for follow-up of elevated BP after having started TLC including DASH diet and aerobic exercise to lower BP. Patient endorses watching diet and doing some bicycling. Patient denies chest pain, palpitations.  Patient endorses average home BP measurement of 135/85. BP 165/99 in clinic today.  Patient endorses right-sided low back pain associated with constipation. Last bowel movement 2 days ago.  Is passing gas.  Denies saddle anesthesia or change to urinary habits.  Has been eating prunes daily.  BP Readings from Last 3 Encounters:  01/29/14 165/99  11/18/13 132/100  10/16/13 138/100    Past Medical History  Diagnosis Date  . Chicken pox   . Measles   . Mumps   . Kidney stones     5-6 times    No current outpatient prescriptions on file prior to visit.   No current facility-administered medications on file prior to visit.    Allergies  Allergen Reactions  . Bee Venom     Lip swelling    Family History  Problem Relation Age of Onset  . Alcohol abuse Father 70    Deceased  . Heart disease Father   . Lung disease Father   . Stomach cancer Mother 5    Deceased  . Hypertension Father   . Hypertension Paternal Grandmother   . Dementia Paternal Grandmother   . Arthritis Maternal Grandmother   . Stroke Paternal Grandmother   . Sudden death Paternal Grandfather   . Sudden death Maternal Grandfather   . Heart attack Paternal Aunt   . Other Maternal Aunt     Natural Causes  . Healthy Son   . Anxiety disorder Daughter     History   Social History  . Marital Status: Married    Spouse Name: N/A    Number of Children: N/A  . Years of Education: N/A   Social History Main Topics  . Smoking status: Never Smoker   . Smokeless tobacco: Never Used  . Alcohol Use: No  . Drug Use: No  . Sexual Activity: Yes   Other Topics Concern  . None   Social History Narrative  . None   Review of Systems - See HPI.  All other ROS are  negative.  BP 165/99  Pulse 70  Temp(Src) 98.7 F (37.1 C) (Oral)  Resp 16  Ht 6' 1.5" (1.867 m)  Wt 218 lb 2 oz (98.941 kg)  BMI 28.38 kg/m2  SpO2 98%  Physical Exam  Vitals reviewed. Constitutional: He is oriented to person, place, and time and well-developed, well-nourished, and in no distress.  HENT:  Head: Normocephalic and atraumatic.  Eyes: Conjunctivae are normal. Pupils are equal, round, and reactive to light.  Neck: Neck supple. No thyromegaly present.  Cardiovascular: Normal rate, regular rhythm, normal heart sounds and intact distal pulses.   Pulmonary/Chest: Effort normal and breath sounds normal. No respiratory distress. He has no wheezes. He has no rales. He exhibits no tenderness.  Abdominal: Soft. Bowel sounds are normal. He exhibits no distension and no mass. There is no tenderness. There is no rebound and no guarding.  Musculoskeletal:       Lumbar back: Normal.  Lymphadenopathy:    He has no cervical adenopathy.  Neurological: He is alert and oriented to person, place, and time.  Skin: Skin is warm and dry. No rash noted.  Psychiatric: Affect normal.   Assessment/Plan: Essential hypertension, benign Repeat BP 160/90.  Likely partially related to pain, but patient with several  previously elevated BP.  Will begin lisinopril 10 mg daily.  Common ADRs discussed with patient.  Follow-up 2-4 weeks for BP recheck.  CN (constipation) Believe this is cause of back pain as well. Will obtain KUB. Increase fluid intake.  Fiber supplement and daily stool softener.  Milk of Magnesia or Miralax daily for 2 days.  Then use if no BM in 48 hours. Will alter treatment based on imaging results.  Tylenol or Ibuprofen for pain.

## 2014-01-29 NOTE — Assessment & Plan Note (Signed)
Believe this is cause of back pain as well. Will obtain KUB. Increase fluid intake.  Fiber supplement and daily stool softener.  Milk of Magnesia or Miralax daily for 2 days.  Then use if no BM in 48 hours. Will alter treatment based on imaging results.  Tylenol or Ibuprofen for pain.

## 2014-02-12 ENCOUNTER — Encounter: Payer: Self-pay | Admitting: Physician Assistant

## 2014-02-12 ENCOUNTER — Ambulatory Visit (INDEPENDENT_AMBULATORY_CARE_PROVIDER_SITE_OTHER): Payer: BC Managed Care – PPO | Admitting: Physician Assistant

## 2014-02-12 VITALS — BP 119/76 | HR 68 | Temp 98.0°F | Resp 16 | Ht 73.5 in | Wt 217.5 lb

## 2014-02-12 DIAGNOSIS — I10 Essential (primary) hypertension: Secondary | ICD-10-CM

## 2014-02-12 NOTE — Assessment & Plan Note (Addendum)
BP normotensive.  Asymptomatic.  Continue current regimen.  Continue DASH diet. Follow-up in Dec/Jan for repeat assessment and BMP.

## 2014-02-12 NOTE — Progress Notes (Signed)
Pre visit review using our clinic review tool, if applicable. No additional management support is needed unless otherwise documented below in the visit note/SLS  

## 2014-02-12 NOTE — Progress Notes (Signed)
   Patient presents to clinic today for follow-up of hypertension after starting Lisinopril 10 mg daily. BP today at 119/76.  Denies chest pain, palpitations, lightheadedness, dizziness. Denies chronic cough with ACEI therapy.   Past Medical History  Diagnosis Date  . Chicken pox   . Measles   . Mumps   . Kidney stones     5-6 times    Current Outpatient Prescriptions on File Prior to Visit  Medication Sig Dispense Refill  . lisinopril (PRINIVIL,ZESTRIL) 10 MG tablet Take 1 tablet (10 mg total) by mouth daily. 30 tablet 2  . Omega-3 Fatty Acids (FISH OIL) 1200 MG CAPS Take by mouth daily.     No current facility-administered medications on file prior to visit.    Allergies  Allergen Reactions  . Bee Venom     Lip swelling    Family History  Problem Relation Age of Onset  . Alcohol abuse Father 36    Deceased  . Heart disease Father   . Lung disease Father   . Stomach cancer Mother 41    Deceased  . Hypertension Father   . Hypertension Paternal Grandmother   . Dementia Paternal Grandmother   . Arthritis Maternal Grandmother   . Stroke Paternal Grandmother   . Sudden death Paternal Grandfather   . Sudden death Maternal Grandfather   . Heart attack Paternal Aunt   . Other Maternal Aunt     Natural Causes  . Healthy Son   . Anxiety disorder Daughter     History   Social History  . Marital Status: Married    Spouse Name: N/A    Number of Children: N/A  . Years of Education: N/A   Social History Main Topics  . Smoking status: Never Smoker   . Smokeless tobacco: Never Used  . Alcohol Use: No  . Drug Use: No  . Sexual Activity: Yes   Other Topics Concern  . None   Social History Narrative   Review of Systems - See HPI.  All other ROS are negative.  BP 119/76 mmHg  Pulse 68  Temp(Src) 98 F (36.7 C) (Oral)  Resp 16  Ht 6' 1.5" (1.867 m)  Wt 217 lb 8 oz (98.657 kg)  BMI 28.30 kg/m2  SpO2 98%  Physical Exam  Constitutional: He is oriented to  person, place, and time and well-developed, well-nourished, and in no distress.  HENT:  Head: Normocephalic and atraumatic.  Eyes: Conjunctivae are normal. Pupils are equal, round, and reactive to light.  Neck: Neck supple.  Cardiovascular: Normal rate, regular rhythm, normal heart sounds and intact distal pulses.   Pulmonary/Chest: Effort normal and breath sounds normal. No respiratory distress. He has no wheezes. He has no rales. He exhibits no tenderness.  Lymphadenopathy:    He has no cervical adenopathy.  Neurological: He is alert and oriented to person, place, and time.  Skin: Skin is warm and dry. No rash noted.  Psychiatric: Affect normal.  Vitals reviewed.  Assessment/Plan: Essential hypertension, benign BP normotensive.  Asymptomatic.  Continue current regimen.  Continue DASH diet. Follow-up in Dec/Jan for repeat assessment and BMP.

## 2014-02-12 NOTE — Patient Instructions (Signed)
Please continue the Lisinopril as directed.  Continue DASH diet and exercise. Follow-up with me before you leave for [place] in January.  We will recheck your BP and your potassium level.  Return sooner if you need anything.  DASH Eating Plan DASH stands for "Dietary Approaches to Stop Hypertension." The DASH eating plan is a healthy eating plan that has been shown to reduce high blood pressure (hypertension). Additional health benefits may include reducing the risk of type 2 diabetes mellitus, heart disease, and stroke. The DASH eating plan may also help with weight loss. WHAT DO I NEED TO KNOW ABOUT THE DASH EATING PLAN? For the DASH eating plan, you will follow these general guidelines:  Choose foods with a percent daily value for sodium of less than 5% (as listed on the food label).  Use salt-free seasonings or herbs instead of table salt or sea salt.  Check with your health care provider or pharmacist before using salt substitutes.  Eat lower-sodium products, often labeled as "lower sodium" or "no salt added."  Eat fresh foods.  Eat more vegetables, fruits, and low-fat dairy products.  Choose whole grains. Look for the word "whole" as the first word in the ingredient list.  Choose fish and skinless chicken or [place] more often than red meat. Limit fish, poultry, and meat to 6 oz (170 g) each day.  Limit sweets, desserts, sugars, and sugary drinks.  Choose heart-healthy fats.  Limit cheese to 1 oz (28 g) per day.  Eat more home-cooked food and less restaurant, buffet, and fast food.  Limit fried foods.  Cook foods using methods other than frying.  Limit canned vegetables. If you do use them, rinse them well to decrease the sodium.  When eating at a restaurant, ask that your food be prepared with less salt, or no salt if possible. WHAT FOODS CAN I EAT? Seek help from a dietitian for individual calorie needs. Grains Whole grain or whole wheat bread. Brown rice. Whole grain  or whole wheat pasta. Quinoa, bulgur, and whole grain cereals. Low-sodium cereals. Corn or whole wheat flour tortillas. Whole grain cornbread. Whole grain crackers. Low-sodium crackers. Vegetables Fresh or frozen vegetables (raw, steamed, roasted, or grilled). Low-sodium or reduced-sodium tomato and vegetable juices. Low-sodium or reduced-sodium tomato sauce and paste. Low-sodium or reduced-sodium canned vegetables.  Fruits All fresh, canned (in natural juice), or frozen fruits. Meat and Other Protein Products Ground beef (85% or leaner), grass-fed beef, or beef trimmed of fat. Skinless chicken or [place]. Ground chicken or [place]. Pork trimmed of fat. All fish and seafood. Eggs. Dried beans, peas, or lentils. Unsalted nuts and seeds. Unsalted canned beans. Dairy Low-fat dairy products, such as skim or 1% milk, 2% or reduced-fat cheeses, low-fat ricotta or cottage cheese, or plain low-fat yogurt. Low-sodium or reduced-sodium cheeses. Fats and Oils Tub margarines without trans fats. Light or reduced-fat mayonnaise and salad dressings (reduced sodium). Avocado. Safflower, olive, or canola oils. Natural peanut or almond butter. Other Unsalted popcorn and pretzels. The items listed above may not be a complete list of recommended foods or beverages. Contact your dietitian for more options. WHAT FOODS ARE NOT RECOMMENDED? Grains White bread. White pasta. White rice. Refined cornbread. Bagels and croissants. Crackers that contain trans fat. Vegetables Creamed or fried vegetables. Vegetables in a cheese sauce. Regular canned vegetables. Regular canned tomato sauce and paste. Regular tomato and vegetable juices. Fruits Dried fruits. Canned fruit in light or heavy syrup. Fruit juice. Meat and Other Protein Products Fatty cuts of  meat. Ribs, chicken wings, bacon, sausage, bologna, salami, chitterlings, fatback, hot dogs, bratwurst, and packaged luncheon meats. Salted nuts and seeds. Canned beans with  salt. Dairy Whole or 2% milk, cream, half-and-half, and cream cheese. Whole-fat or sweetened yogurt. Full-fat cheeses or blue cheese. Nondairy creamers and whipped toppings. Processed cheese, cheese spreads, or cheese curds. Condiments Onion and garlic salt, seasoned salt, table salt, and sea salt. Canned and packaged gravies. Worcestershire sauce. Tartar sauce. Barbecue sauce. Teriyaki sauce. Soy sauce, including reduced sodium. Steak sauce. Fish sauce. Oyster sauce. Cocktail sauce. Horseradish. Ketchup and mustard. Meat flavorings and tenderizers. Bouillon cubes. Hot sauce. Tabasco sauce. Marinades. Taco seasonings. Relishes. Fats and Oils Butter, stick margarine, lard, shortening, ghee, and bacon fat. Coconut, palm kernel, or palm oils. Regular salad dressings. Other Pickles and olives. Salted popcorn and pretzels. The items listed above may not be a complete list of foods and beverages to avoid. Contact your dietitian for more information. WHERE CAN I FIND MORE INFORMATION? National Heart, Lung, and Blood Institute: travelstabloid.com Document Released: 03/08/2011 Document Revised: 08/03/2013 Document Reviewed: 01/21/2013 [facility] Patient Information 2015 [place], [place]. This information is not intended to replace advice given to you by your health care provider. Make sure you discuss any questions you have with your health care provider.

## 2014-03-30 ENCOUNTER — Encounter: Payer: Self-pay | Admitting: Physician Assistant

## 2014-03-30 ENCOUNTER — Ambulatory Visit (INDEPENDENT_AMBULATORY_CARE_PROVIDER_SITE_OTHER): Payer: BC Managed Care – PPO | Admitting: Physician Assistant

## 2014-03-30 VITALS — BP 125/86 | HR 69 | Temp 98.1°F | Ht 74.0 in | Wt 222.6 lb

## 2014-03-30 DIAGNOSIS — I10 Essential (primary) hypertension: Secondary | ICD-10-CM

## 2014-03-30 LAB — BASIC METABOLIC PANEL
BUN: 21 mg/dL (ref 6–23)
CO2: 25 mEq/L (ref 19–32)
Calcium: 9.3 mg/dL (ref 8.4–10.5)
Chloride: 109 mEq/L (ref 96–112)
Creatinine, Ser: 1.2 mg/dL (ref 0.4–1.5)
GFR: 68.3 mL/min (ref >60.00)
Glucose, Bld: 126 mg/dL — ABNORMAL HIGH (ref 70–99)
POTASSIUM: 4.2 meq/L (ref 3.5–5.1)
SODIUM: 140 meq/L (ref 135–145)

## 2014-03-30 MED ORDER — LISINOPRIL 10 MG PO TABS: 10.0000 mg | ORAL_TABLET | Freq: Every day | ORAL | Status: DC

## 2014-03-30 NOTE — Progress Notes (Signed)
Pre visit review using our clinic review tool, if applicable. No additional management support is needed unless otherwise documented below in the visit note. 

## 2014-03-30 NOTE — Patient Instructions (Signed)
Please continue medications as directed.  Limit salt intake.  I will call you with your lab results.  Follow-up with me in 6 months.  Hypertension Hypertension is another name for high blood pressure. High blood pressure forces your heart to work harder to pump blood. A blood pressure reading has two numbers, which includes a higher number over a lower number (example: 110/72). HOME CARE   Have your blood pressure rechecked by your doctor.  Only take medicine as told by your doctor. Follow the directions carefully. The medicine does not work as well if you skip doses. Skipping doses also puts you at risk for problems.  Do not smoke.  Monitor your blood pressure at home as told by your doctor. GET HELP IF:  You think you are having a reaction to the medicine you are taking.  You have repeat headaches or feel dizzy.  You have puffiness (swelling) in your ankles.  You have trouble with your vision. GET HELP RIGHT AWAY IF:   You get a very bad headache and are confused.  You feel weak, numb, or faint.  You get chest or belly (abdominal) pain.  You throw up (vomit).  You cannot breathe very well. MAKE SURE YOU:   Understand these instructions.  Will watch your condition.  Will get help right away if you are not doing well or get worse. Document Released: 09/05/2007 Document Revised: 03/24/2013 Document Reviewed: 01/09/2013 [facility] Patient Information 2015 [place], [place]. This information is not intended to replace advice given to you by your health care provider. Make sure you discuss any questions you have with your health care provider.

## 2014-03-30 NOTE — Assessment & Plan Note (Signed)
Well-controlled.  Medications refilled. Continue current regimen.  Will check BMP today to reassess potassium level.  Follow-up in 6 months.

## 2014-03-30 NOTE — Progress Notes (Signed)
   Patient presents to clinic today for follow-up of hypertension.  BP previously well-controlled. Patient due for repeat BP assessment and potassium check.  Patient endorses taking medication daily as directed.  Takes in the morning.  BP normotensive in clinic today.  Denies chest pain, palpitations, lightheadedness, dizziness.    Past Medical History  Diagnosis Date  . Chicken pox   . Measles   . Mumps   . Kidney stones     5-6 times    Current Outpatient Prescriptions on File Prior to Visit  Medication Sig Dispense Refill  . Omega-3 Fatty Acids (FISH OIL) 1200 MG CAPS Take by mouth daily.     No current facility-administered medications on file prior to visit.    Allergies  Allergen Reactions  . Bee Venom     Lip swelling    Family History  Problem Relation Age of Onset  . Alcohol abuse Father 76    Deceased  . Heart disease Father   . Lung disease Father   . Stomach cancer Mother 32    Deceased  . Hypertension Father   . Hypertension Paternal Grandmother   . Dementia Paternal Grandmother   . Arthritis Maternal Grandmother   . Stroke Paternal Grandmother   . Sudden death Paternal Grandfather   . Sudden death Maternal Grandfather   . Heart attack Paternal Aunt   . Other Maternal Aunt     Natural Causes  . Healthy Son   . Anxiety disorder Daughter     History   Social History  . Marital Status: Married    Spouse Name: N/A    Number of Children: N/A  . Years of Education: N/A   Social History Main Topics  . Smoking status: Never Smoker   . Smokeless tobacco: Never Used  . Alcohol Use: No  . Drug Use: No  . Sexual Activity: Yes   Other Topics Concern  . None   Social History Narrative   Review of Systems - See HPI.  All other ROS are negative.  BP 125/86 mmHg  Pulse 69  Temp(Src) 98.1 F (36.7 C) (Oral)  Ht 6\' 2"  (1.88 m)  Wt 222 lb 9.6 oz (100.971 kg)  BMI 28.57 kg/m2  SpO2 98%  Physical Exam  Constitutional: He is oriented to person,  place, and time and well-developed, well-nourished, and in no distress.  HENT:  Head: Normocephalic and atraumatic.  Right Ear: External ear normal.  Left Ear: External ear normal.  Nose: Nose normal.  Mouth/Throat: Oropharynx is clear and moist. No oropharyngeal exudate.  Eyes: Conjunctivae are normal. Pupils are equal, round, and reactive to light.  Neck: Neck supple.  Cardiovascular: Normal rate, regular rhythm, normal heart sounds and intact distal pulses.   Pulmonary/Chest: Effort normal and breath sounds normal. No respiratory distress. He has no wheezes. He has no rales. He exhibits no tenderness.  Lymphadenopathy:    He has no cervical adenopathy.  Neurological: He is alert and oriented to person, place, and time.  Skin: Skin is warm and dry. No rash noted.  Psychiatric: Affect normal.  Vitals reviewed.  Assessment/Plan: Essential hypertension, benign Well-controlled.  Medications refilled. Continue current regimen.  Will check BMP today to reassess potassium level.  Follow-up in 6 months.

## 2014-12-21 ENCOUNTER — Telehealth: Payer: Self-pay | Admitting: Physician Assistant

## 2014-12-21 DIAGNOSIS — I10 Essential (primary) hypertension: Secondary | ICD-10-CM

## 2014-12-21 NOTE — Telephone Encounter (Signed)
He is due for a follow-up for BP -- was told in December to follow-up in 6 months.  Can have 30 day supply with no refills until he is seen.

## 2014-12-21 NOTE — Telephone Encounter (Signed)
Caller name: Micheal Oliver Relationship to patient: Self  Can be reached: [phone] Pharmacy:  Reason for call: Pt says that he need to get a T-Dap and Shingles vaccination.

## 2014-12-21 NOTE — Telephone Encounter (Signed)
Caller name: Amory Simonetti  Relationship to patient: Self   Can be reached: [phone]  Pharmacy: [id number] - [place], [place] - [address] AT [address]  Reason for call: pt needs a refill on his lisinopril . Pt says he's almost out.

## 2014-12-21 NOTE — Telephone Encounter (Signed)
Can have shingles if previous chicken pox infection as he is over 60. If no known chicken pox, he will need immunity status testing.  Tetanus he can have if not had in 10 years.

## 2014-12-22 ENCOUNTER — Ambulatory Visit: Payer: BC Managed Care – PPO

## 2014-12-22 DIAGNOSIS — Z23 Encounter for immunization: Secondary | ICD-10-CM

## 2014-12-22 MED ORDER — TETANUS-DIPHTH-ACELL PERTUSSIS 5-2.5-18.5 LF-MCG/0.5 IM SUSP
0.5000 mL | Freq: Once | INTRAMUSCULAR | Status: AC
  Administered 2014-12-22: 0.5 mL via INTRAMUSCULAR

## 2014-12-22 MED ORDER — LISINOPRIL 10 MG PO TABS: 10.0000 mg | ORAL_TABLET | Freq: Every day | ORAL | Status: DC

## 2014-12-22 MED ORDER — INFLUENZA VAC SPLIT QUAD 0.5 ML IM SUSY
0.5000 mL | PREFILLED_SYRINGE | Freq: Once | INTRAMUSCULAR | Status: AC
  Administered 2014-12-22: 0.5 mL via INTRAMUSCULAR

## 2014-12-22 NOTE — Telephone Encounter (Signed)
30-day supply sent in until follow-up

## 2014-12-22 NOTE — Telephone Encounter (Signed)
Pt came in office for a Flu shot appt pt states went to the pharmacy and had no rx ready, pt understood is needing going to schedule an appt for fu BP check but is wanting to know if rx can be sent to pharmacy at [facility] . Please advise.

## 2015-01-21 ENCOUNTER — Ambulatory Visit (INDEPENDENT_AMBULATORY_CARE_PROVIDER_SITE_OTHER): Payer: BC Managed Care – PPO | Admitting: Physician Assistant

## 2015-01-21 ENCOUNTER — Encounter: Payer: Self-pay | Admitting: Physician Assistant

## 2015-01-21 VITALS — BP 128/82 | HR 69 | Temp 98.1°F | Resp 16 | Ht 72.0 in | Wt 224.4 lb

## 2015-01-21 DIAGNOSIS — Z23 Encounter for immunization: Secondary | ICD-10-CM | POA: Diagnosis not present

## 2015-01-21 DIAGNOSIS — I10 Essential (primary) hypertension: Secondary | ICD-10-CM

## 2015-01-21 DIAGNOSIS — G514 Facial myokymia: Secondary | ICD-10-CM

## 2015-01-21 LAB — BASIC METABOLIC PANEL
BUN: 21 mg/dL (ref 6–23)
CALCIUM: 9.4 mg/dL (ref 8.4–10.5)
CO2: 28 meq/L (ref 19–32)
Chloride: 109 mEq/L (ref 96–112)
Creatinine, Ser: 1.13 mg/dL (ref 0.40–1.50)
GFR: 69.51 mL/min (ref >60.00)
GLUCOSE: 142 mg/dL — AB (ref 70–99)
Potassium: 4.9 mEq/L (ref 3.5–5.1)
SODIUM: 143 meq/L (ref 135–145)

## 2015-01-21 MED ORDER — LISINOPRIL 10 MG PO TABS: 10.0000 mg | ORAL_TABLET | Freq: Every day | ORAL | Status: DC

## 2015-01-21 NOTE — Patient Instructions (Signed)
Please go to the lab for blood work. I will call you with your results. Continue BP medications as directed, along with well-balanced diet and exercise regimen.  You are due for a physical in late December.

## 2015-01-21 NOTE — Assessment & Plan Note (Signed)
Well-controlled. Asymptomatic. Will check BMP today. Medications refilled.

## 2015-01-21 NOTE — Progress Notes (Signed)
Patient presents to clinic today for follow-up of hypertension. Is currently on lisinopril 10 mg daily. Endorses taking daily as directed. Patient denies chest pain, palpitations, lightheadedness, dizziness, vision changes or frequent headaches.  BP Readings from Last 3 Encounters:  01/21/15 128/82  03/30/14 125/86  02/12/14 119/76   Patient also complains of intermittent twitching underneath right eye, especially at night. Has been going on for several months. Denies significant stress. Denies trauma or injury. Denies numbness or tingling to the area.  Patient also requesting Zostavax. Has history of chicken pox infection.  Past Medical History  Diagnosis Date  . Chicken pox   . Measles   . Mumps   . Kidney stones     5-6 times    Current Outpatient Prescriptions on File Prior to Visit  Medication Sig Dispense Refill  . Omega-3 Fatty Acids (FISH OIL) 1200 MG CAPS Take by mouth daily.     No current facility-administered medications on file prior to visit.    Allergies  Allergen Reactions  . Bee Venom     Lip swelling    Family History  Problem Relation Age of Onset  . Alcohol abuse Father 51    Deceased  . Heart disease Father   . Lung disease Father   . Stomach cancer Mother 19    Deceased  . Hypertension Father   . Hypertension Paternal Grandmother   . Dementia Paternal Grandmother   . Arthritis Maternal Grandmother   . Stroke Paternal Grandmother   . Sudden death Paternal Grandfather   . Sudden death Maternal Grandfather   . Heart attack Paternal Aunt   . Other Maternal Aunt     Natural Causes  . Healthy Son   . Anxiety disorder Daughter     Social History   Social History  . Marital Status: Married    Spouse Name: N/A  . Number of Children: N/A  . Years of Education: N/A   Social History Main Topics  . Smoking status: Never Smoker   . Smokeless tobacco: Never Used  . Alcohol Use: No  . Drug Use: No  . Sexual Activity: Yes   Other Topics  Concern  . None   Social History Narrative   Review of Systems - See HPI.  All other ROS are negative.  BP 128/82 mmHg  Pulse 69  Temp(Src) 98.1 F (36.7 C) (Oral)  Resp 16  Ht 6' (1.829 m)  Wt 224 lb 6 oz (101.776 kg)  BMI 30.42 kg/m2  SpO2 98%  Physical Exam  Constitutional: He is oriented to person, place, and time and well-developed, well-nourished, and in no distress.  HENT:  Head: Normocephalic and atraumatic.  Eyes: Conjunctivae are normal.  Neck: Neck supple.  Cardiovascular: Normal rate, regular rhythm, normal heart sounds and intact distal pulses.   Pulmonary/Chest: Effort normal and breath sounds normal. No respiratory distress. He has no wheezes. He has no rales. He exhibits no tenderness.  Neurological: He is alert and oriented to person, place, and time.  Skin: Skin is warm and dry. No rash noted.  Psychiatric: Affect normal.  Vitals reviewed.   No results found for this or any previous visit (from the past 2160 hour(s)).  Assessment/Plan: Need for shingles vaccine Patient wishes to have today in office. Immunization given by nursing staff.  Facial twitching Will check electrolyte panel today to include calcium. If all negative, will refer to Neuro.  Essential hypertension, benign Well-controlled. Asymptomatic. Will check BMP today. Medications refilled.

## 2015-01-21 NOTE — Assessment & Plan Note (Signed)
Patient wishes to have today in office. Immunization given by nursing staff.

## 2015-01-21 NOTE — Progress Notes (Signed)
Pre visit review using our clinic review tool, if applicable. No additional management support is needed unless otherwise documented below in the visit note/SLS  

## 2015-01-21 NOTE — Addendum Note (Signed)
Addended by: Rockwell Germany on: 01/21/2015 09:12 AM   Modules accepted: Orders

## 2015-01-21 NOTE — Assessment & Plan Note (Signed)
Will check electrolyte panel today to include calcium. If all negative, will refer to Neuro.

## 2015-07-07 ENCOUNTER — Encounter: Payer: Self-pay | Admitting: Behavioral Health

## 2015-07-07 ENCOUNTER — Telehealth: Payer: Self-pay | Admitting: Behavioral Health

## 2015-07-07 NOTE — Telephone Encounter (Signed)
Pre-Visit Call completed with patient and chart updated.   Pre-Visit Info documented in Specialty Comments under [employer].    

## 2015-07-11 ENCOUNTER — Ambulatory Visit (INDEPENDENT_AMBULATORY_CARE_PROVIDER_SITE_OTHER): Payer: BC Managed Care – PPO | Admitting: Physician Assistant

## 2015-07-11 ENCOUNTER — Encounter: Payer: Self-pay | Admitting: Physician Assistant

## 2015-07-11 VITALS — BP 128/80 | HR 79 | Temp 98.0°F | Ht 74.0 in | Wt 219.6 lb

## 2015-07-11 DIAGNOSIS — Z8249 Family history of ischemic heart disease and other diseases of the circulatory system: Secondary | ICD-10-CM | POA: Diagnosis not present

## 2015-07-11 DIAGNOSIS — Z0000 Encounter for general adult medical examination without abnormal findings: Secondary | ICD-10-CM

## 2015-07-11 DIAGNOSIS — L84 Corns and callosities: Secondary | ICD-10-CM

## 2015-07-11 DIAGNOSIS — Z125 Encounter for screening for malignant neoplasm of prostate: Secondary | ICD-10-CM | POA: Diagnosis not present

## 2015-07-11 DIAGNOSIS — G514 Facial myokymia: Secondary | ICD-10-CM | POA: Diagnosis not present

## 2015-07-11 DIAGNOSIS — I10 Essential (primary) hypertension: Secondary | ICD-10-CM

## 2015-07-11 DIAGNOSIS — Z136 Encounter for screening for cardiovascular disorders: Secondary | ICD-10-CM

## 2015-07-11 LAB — URINALYSIS, ROUTINE W REFLEX MICROSCOPIC
Bilirubin Urine: NEGATIVE
Hgb urine dipstick: NEGATIVE
Ketones, ur: NEGATIVE
Leukocytes, UA: NEGATIVE
Nitrite: NEGATIVE
RBC / HPF: NONE SEEN (ref 0–?)
SPECIFIC GRAVITY, URINE: 1.025 (ref 1.000–1.030)
Total Protein, Urine: NEGATIVE
Urine Glucose: NEGATIVE
Urobilinogen, UA: 0.2 (ref 0.0–1.0)
WBC UA: NONE SEEN (ref 0–?)
pH: 5.5 (ref 5.0–8.0)

## 2015-07-11 LAB — LIPID PANEL
CHOLESTEROL: 205 mg/dL — AB (ref 0–200)
HDL: 34.3 mg/dL — AB (ref >39.00)
LDL Cholesterol: 134 mg/dL — ABNORMAL HIGH (ref 0–99)
NonHDL: 170.71
Total CHOL/HDL Ratio: 6
Triglycerides: 186 mg/dL — ABNORMAL HIGH (ref 0.0–149.0)
VLDL: 37.2 mg/dL (ref 0.0–40.0)

## 2015-07-11 LAB — CBC
HCT: 45 % (ref 39.0–52.0)
Hemoglobin: 15.5 g/dL (ref 13.0–17.0)
MCHC: 34.3 g/dL (ref 30.0–36.0)
MCV: 90.8 fl (ref 78.0–100.0)
PLATELETS: 200 10*3/uL (ref 150.0–400.0)
RBC: 4.96 Mil/uL (ref 4.22–5.81)
RDW: 13.7 % (ref 11.5–15.5)
WBC: 7.5 10*3/uL (ref 4.0–10.5)

## 2015-07-11 LAB — COMPREHENSIVE METABOLIC PANEL
ALT: 47 U/L (ref 0–53)
AST: 24 U/L (ref 0–37)
Albumin: 4.5 g/dL (ref 3.5–5.2)
Alkaline Phosphatase: 75 U/L (ref 39–117)
BUN: 25 mg/dL — ABNORMAL HIGH (ref 6–23)
CALCIUM: 10.3 mg/dL (ref 8.4–10.5)
CHLORIDE: 105 meq/L (ref 96–112)
CO2: 28 meq/L (ref 19–32)
CREATININE: 1.23 mg/dL (ref 0.40–1.50)
GFR: 62.94 mL/min (ref >60.00)
Glucose, Bld: 138 mg/dL — ABNORMAL HIGH (ref 70–99)
Potassium: 4.8 mEq/L (ref 3.5–5.1)
Sodium: 140 mEq/L (ref 135–145)
Total Bilirubin: 0.9 mg/dL (ref 0.2–1.2)
Total Protein: 7.3 g/dL (ref 6.0–8.3)

## 2015-07-11 LAB — PSA: PSA: 1.74 ng/mL (ref 0.10–4.00)

## 2015-07-11 LAB — TSH: TSH: 1.48 u[IU]/mL (ref 0.35–4.50)

## 2015-07-11 LAB — HEMOGLOBIN A1C: Hgb A1c MFr Bld: 6.8 % — ABNORMAL HIGH (ref 4.6–6.5)

## 2015-07-11 MED ORDER — LISINOPRIL 10 MG PO TABS: 10.0000 mg | ORAL_TABLET | Freq: Every day | ORAL | Status: DC

## 2015-07-11 NOTE — Assessment & Plan Note (Signed)
Depression screen negative. Health Maintenance reviewed -- immunization and colonoscopy are up-to-date Preventive schedule discussed and handout given in AVS. Will obtain fasting labs today.

## 2015-07-11 NOTE — Assessment & Plan Note (Signed)
Referral to podiatry placed

## 2015-07-11 NOTE — Progress Notes (Signed)
Patient presents to clinic today for annual exam.  Patient is fasting for labs.  Acute Concerns: Patient endorses continued facial twitch just underneath the R eye. Denies change in vision. Denies history of trauma or injury. Have discussed this issue previously with normal lab workup. Had recommended Neurology assessment. Patient would like to referral.   Patient endorses thick callus of R great toe worsening over the past month with cracking in the callus. Denies pain. Has been using emory board to the area.    Chronic Issues: Hypertension -- Is currently on lisinopril 10 mg daily. Patient denies chest pain, palpitations, lightheadedness, dizziness, vision changes or frequent headaches. EKG will be obtained today. Does have family history of CAD with father and sister passing away from heart attacks in the 66s. Has never had a stress test.  BP Readings from Last 3 Encounters:  07/11/15 128/80  01/21/15 128/82  03/30/14 125/86   Health Maintenance: Immunizations -- up-to-date Colonoscopy -- up-to-date  Past Medical History  Diagnosis Date  . Chicken pox   . Measles   . Mumps   . Kidney stones     5-6 times    Past Surgical History  Procedure Laterality Date  . Wisdom tooth extraction  1976-77    Current Outpatient Prescriptions on File Prior to Visit  Medication Sig Dispense Refill  . Omega-3 Fatty Acids (FISH OIL) 1200 MG CAPS Take by mouth daily.     No current facility-administered medications on file prior to visit.    Allergies  Allergen Reactions  . Bee Venom     Lip swelling    Family History  Problem Relation Age of Onset  . Alcohol abuse Father 34    Deceased  . Heart disease Father   . Lung disease Father   . Stomach cancer Mother 48    Deceased  . Hypertension Father   . Hypertension Paternal Grandmother   . Dementia Paternal Grandmother   . Arthritis Maternal Grandmother   . Stroke Paternal Grandmother   . Sudden death Paternal Grandfather    . Sudden death Maternal Grandfather   . Heart attack Paternal Aunt   . Other Maternal Aunt     Natural Causes  . Healthy Son   . Anxiety disorder Daughter     Social History   Social History  . Marital Status: Married    Spouse Name: N/A  . Number of Children: N/A  . Years of Education: N/A   Occupational History  . Not on file.   Social History Main Topics  . Smoking status: Never Smoker   . Smokeless tobacco: Never Used  . Alcohol Use: No  . Drug Use: No  . Sexual Activity: Yes   Other Topics Concern  . Not on file   Social History Narrative   Review of Systems  Constitutional: Negative for fever and weight loss.  HENT: Negative for ear discharge, ear pain, hearing loss and tinnitus.   Eyes: Negative for blurred vision, double vision, photophobia and pain.  Respiratory: Positive for cough. Negative for shortness of breath.   Cardiovascular: Negative for chest pain and palpitations.  Gastrointestinal: Positive for heartburn. Negative for nausea, vomiting, abdominal pain, diarrhea, constipation, blood in stool and melena.  Genitourinary: Negative for dysuria, urgency, frequency, hematuria and flank pain.  Musculoskeletal: Negative for falls.  Neurological: Negative for dizziness, loss of consciousness and headaches.  Endo/Heme/Allergies: Negative for environmental allergies.  Psychiatric/Behavioral: Negative for depression, suicidal ideas, hallucinations and substance abuse. The patient  is not nervous/anxious and does not have insomnia.    BP 128/80 mmHg  Pulse 79  Temp(Src) 98 F (36.7 C) (Oral)  Ht '6\' 2"'$  (1.88 m)  Wt 219 lb 9.6 oz (99.61 kg)  BMI 28.18 kg/m2  SpO2 96%  Physical Exam  Constitutional: He is oriented to person, place, and time and well-developed, well-nourished, and in no distress.  HENT:  Head: Normocephalic and atraumatic.  Right Ear: External ear normal.  Left Ear: External ear normal.  Nose: Nose normal.  Mouth/Throat: Oropharynx is  clear and moist. No oropharyngeal exudate.  Eyes: Conjunctivae and EOM are normal. Pupils are equal, round, and reactive to light.  Neck: Neck supple. No thyromegaly present.  Cardiovascular: Normal rate, regular rhythm, normal heart sounds and intact distal pulses.   Pulmonary/Chest: Effort normal and breath sounds normal. No respiratory distress. He has no wheezes. He has no rales. He exhibits no tenderness.  Abdominal: Soft. Bowel sounds are normal. He exhibits no distension and no mass. There is no tenderness. There is no rebound and no guarding.  Genitourinary: Testes/scrotum normal and penis normal. No discharge found.  Lymphadenopathy:    He has no cervical adenopathy.  Neurological: He is alert and oriented to person, place, and time. He has normal sensation and intact cranial nerves. He displays facial symmetry. No sensory deficit.  No eye twitching noted on examination  Skin: Skin is warm and dry. No rash noted.     Psychiatric: Affect normal.  Vitals reviewed.   Assessment/Plan: Visit for preventive health examination Depression screen negative. Health Maintenance reviewed -- immunization and colonoscopy are up-to-date Preventive schedule discussed and handout given in AVS. Will obtain fasting labs today.   Facial twitching Not reproducible on exam. Prior workup negative. We'll refer to neurology  Essential hypertension, benign Well-controlled. EKG reveals normal sinus rhythm. Will obtain CMP today. Continue current regimen.  Family history of early CAD EKG with normal sinus rhythm. Patient to start 81 mg aspirin daily. Order for stress test place giving family history.  Callus of foot Referral to podiatry placed.

## 2015-07-11 NOTE — Assessment & Plan Note (Signed)
Well-controlled. EKG reveals normal sinus rhythm. Will obtain CMP today. Continue current regimen.

## 2015-07-11 NOTE — Assessment & Plan Note (Signed)
EKG with normal sinus rhythm. Patient to start 81 mg aspirin daily. Order for stress test place giving family history.

## 2015-07-11 NOTE — Progress Notes (Signed)
Pre visit review using our clinic review tool, if applicable. No additional management support is needed unless otherwise documented below in the visit note. 

## 2015-07-11 NOTE — Assessment & Plan Note (Signed)
Not reproducible on exam. Prior workup negative. We'll refer to neurology

## 2015-07-11 NOTE — Patient Instructions (Signed)
Please go to the lab for blood work.  I will call you with your results. If your blood work is normal we will follow-up yearly for physicals.  We will treat abnormal findings and get you in for follow-up.  You will be contacted for appointments with Podiatry and Neurology. You will also be contacted to schedule a stress test.  Preventive Care for Adults, Male A healthy lifestyle and preventive care can promote health and wellness. Preventive health guidelines for men include the following key practices:  A routine yearly physical is a good way to check with your health care provider about your health and preventative screening. It is a chance to share any concerns and updates on your health and to receive a thorough exam.  Visit your dentist for a routine exam and preventative care every 6 months. Brush your teeth twice a day and floss once a day. Good oral hygiene prevents tooth decay and gum disease.  The frequency of eye exams is based on your age, health, family medical history, use of contact lenses, and other factors. Follow your health care provider's recommendations for frequency of eye exams.  Eat a healthy diet. Foods such as vegetables, fruits, whole grains, low-fat dairy products, and lean protein foods contain the nutrients you need without too many calories. Decrease your intake of foods high in solid fats, added sugars, and salt. Eat the right amount of calories for you.Get information about a proper diet from your health care provider, if necessary.  Regular physical exercise is one of the most important things you can do for your health. Most adults should get at least 150 minutes of moderate-intensity exercise (any activity that increases your heart rate and causes you to sweat) each week. In addition, most adults need muscle-strengthening exercises on 2 or more days a week.  Maintain a healthy weight. The body mass index (BMI) is a screening tool to identify possible weight  problems. It provides an estimate of body fat based on height and weight. Your health care provider can find your BMI and can help you achieve or maintain a healthy weight.For adults 20 years and older:  A BMI below 18.5 is considered underweight.  A BMI of 18.5 to 24.9 is normal.  A BMI of 25 to 29.9 is considered overweight.  A BMI of 30 and above is considered obese.  Maintain normal blood lipids and cholesterol levels by exercising and minimizing your intake of saturated fat. Eat a balanced diet with plenty of fruit and vegetables. Blood tests for lipids and cholesterol should begin at age 30 and be repeated every 5 years. If your lipid or cholesterol levels are high, you are over 50, or you are at high risk for heart disease, you may need your cholesterol levels checked more frequently.Ongoing high lipid and cholesterol levels should be treated with medicines if diet and exercise are not working.  If you smoke, find out from your health care provider how to quit. If you do not use tobacco, do not start.  Lung cancer screening is recommended for adults aged 53-80 years who are at high risk for developing lung cancer because of a history of smoking. A yearly low-dose CT scan of the lungs is recommended for people who have at least a 30-pack-year history of smoking and are a current smoker or have quit within the past 15 years. A pack year of smoking is smoking an average of 1 pack of cigarettes a day for 1 year (for example:  1 pack a day for 30 years or 2 packs a day for 15 years). Yearly screening should continue until the smoker has stopped smoking for at least 15 years. Yearly screening should be stopped for people who develop a health problem that would prevent them from having lung cancer treatment.  If you choose to drink alcohol, do not have more than 2 drinks per day. One drink is considered to be 12 ounces (355 mL) of beer, 5 ounces (148 mL) of wine, or 1.5 ounces (44 mL) of  liquor.  Avoid use of street drugs. Do not share needles with anyone. Ask for help if you need support or instructions about stopping the use of drugs.  High blood pressure causes heart disease and increases the risk of stroke. Your blood pressure should be checked at least every 1-2 years. Ongoing high blood pressure should be treated with medicines, if weight loss and exercise are not effective.  If you are 14-81 years old, ask your health care provider if you should take aspirin to prevent heart disease.  Diabetes screening is done by taking a blood sample to check your blood glucose level after you have not eaten for a certain period of time (fasting). If you are not overweight and you do not have risk factors for diabetes, you should be screened once every 3 years starting at age 51. If you are overweight or obese and you are 80-31 years of age, you should be screened for diabetes every year as part of your cardiovascular risk assessment.  Colorectal cancer can be detected and often prevented. Most routine colorectal cancer screening begins at the age of 52 and continues through age 3. However, your health care provider may recommend screening at an earlier age if you have risk factors for colon cancer. On a yearly basis, your health care provider may provide home test kits to check for hidden blood in the stool. Use of a small camera at the end of a tube to directly examine the colon (sigmoidoscopy or colonoscopy) can detect the earliest forms of colorectal cancer. Talk to your health care provider about this at age 96, when routine screening begins. Direct exam of the colon should be repeated every 5-10 years through age 70, unless early forms of precancerous polyps or small growths are found.  People who are at an increased risk for hepatitis B should be screened for this virus. You are considered at high risk for hepatitis B if:  You were born in a country where hepatitis B occurs often. Talk  with your health care provider about which countries are considered high risk.  Your parents were born in a high-risk country and you have not received a shot to protect against hepatitis B (hepatitis B vaccine).  You have HIV or AIDS.  You use needles to inject street drugs.  You live with, or have sex with, someone who has hepatitis B.  You are a man who has sex with other men (MSM).  You get hemodialysis treatment.  You take certain medicines for conditions such as cancer, organ transplantation, and autoimmune conditions.  Hepatitis C blood testing is recommended for all people born from [birth year] through 1965 and any individual with known risks for hepatitis C.  Practice safe sex. Use condoms and avoid high-risk sexual practices to reduce the spread of sexually transmitted infections (STIs). STIs include gonorrhea, chlamydia, syphilis, trichomonas, herpes, HPV, and human immunodeficiency virus (HIV). Herpes, HIV, and HPV are viral illnesses that have no  cure. They can result in disability, cancer, and death.  If you are a man who has sex with other men, you should be screened at least once per year for:  HIV.  Urethral, rectal, and pharyngeal infection of gonorrhea, chlamydia, or both.  If you are at risk of being infected with HIV, it is recommended that you take a prescription medicine daily to prevent HIV infection. This is called preexposure prophylaxis (PrEP). You are considered at risk if:  You are a man who has sex with other men (MSM) and have other risk factors.  You are a heterosexual man, are sexually active, and are at increased risk for HIV infection.  You take drugs by injection.  You are sexually active with a partner who has HIV.  Talk with your health care provider about whether you are at high risk of being infected with HIV. If you choose to begin PrEP, you should first be tested for HIV. You should then be tested every 3 months for as long as you are taking  PrEP.  A one-time screening for abdominal aortic aneurysm (AAA) and surgical repair of large AAAs by ultrasound are recommended for men ages 82 to 26 years who are current or former smokers.  Healthy men should no longer receive prostate-specific antigen (PSA) blood tests as part of routine cancer screening. Talk with your health care provider about prostate cancer screening.  Testicular cancer screening is not recommended for adult males who have no symptoms. Screening includes self-exam, a health care provider exam, and other screening tests. Consult with your health care provider about any symptoms you have or any concerns you have about testicular cancer.  Use sunscreen. Apply sunscreen liberally and repeatedly throughout the day. You should seek shade when your shadow is shorter than you. Protect yourself by wearing long sleeves, pants, a wide-brimmed hat, and sunglasses year round, whenever you are outdoors.  Once a month, do a whole-body skin exam, using a mirror to look at the skin on your back. Tell your health care provider about new moles, moles that have irregular borders, moles that are larger than a pencil eraser, or moles that have changed in shape or color.  Stay current with required vaccines (immunizations).  Influenza vaccine. All adults should be immunized every year.  Tetanus, diphtheria, and acellular pertussis (Td, Tdap) vaccine. An adult who has not previously received Tdap or who does not know his vaccine status should receive 1 dose of Tdap. This initial dose should be followed by tetanus and diphtheria toxoids (Td) booster doses every 10 years. Adults with an unknown or incomplete history of completing a 3-dose immunization series with Td-containing vaccines should begin or complete a primary immunization series including a Tdap dose. Adults should receive a Td booster every 10 years.  Varicella vaccine. An adult without evidence of immunity to varicella should receive 2  doses or a second dose if he has previously received 1 dose.  Human papillomavirus (HPV) vaccine. Males aged 11-21 years who have not received the vaccine previously should receive the 3-dose series. Males aged 22-26 years may be immunized. Immunization is recommended through the age of 37 years for any male who has sex with males and did not get any or all doses earlier. Immunization is recommended for any person with an immunocompromised condition through the age of 59 years if he did not get any or all doses earlier. During the 3-dose series, the second dose should be obtained 4-8 weeks after the first  dose. The third dose should be obtained 24 weeks after the first dose and 16 weeks after the second dose.  Zoster vaccine. One dose is recommended for adults aged 59 years or older unless certain conditions are present.  Measles, mumps, and rubella (MMR) vaccine. Adults born before [birth year] generally are considered immune to measles and mumps. Adults born in [birth year] or later should have 1 or more doses of MMR vaccine unless there is a contraindication to the vaccine or there is laboratory evidence of immunity to each of the three diseases. A routine second dose of MMR vaccine should be obtained at least 28 days after the first dose for students attending postsecondary schools, health care workers, or international travelers. People who received inactivated measles vaccine or an unknown type of measles vaccine during 1963-1967 should receive 2 doses of MMR vaccine. People who received inactivated mumps vaccine or an unknown type of mumps vaccine before 1979 and are at high risk for mumps infection should consider immunization with 2 doses of MMR vaccine. Unvaccinated health care workers born before [birth year] who lack laboratory evidence of measles, mumps, or rubella immunity or laboratory confirmation of disease should consider measles and mumps immunization with 2 doses of MMR vaccine or rubella immunization with 1 dose  of MMR vaccine.  Pneumococcal 13-valent conjugate (PCV13) vaccine. When indicated, a person who is uncertain of his immunization history and has no record of immunization should receive the PCV13 vaccine. All adults 47 years of age and older should receive this vaccine. An adult aged 89 years or older who has certain medical conditions and has not been previously immunized should receive 1 dose of PCV13 vaccine. This PCV13 should be followed with a dose of pneumococcal polysaccharide (PPSV23) vaccine. Adults who are at high risk for pneumococcal disease should obtain the PPSV23 vaccine at least 8 weeks after the dose of PCV13 vaccine. Adults older than 64 years of age who have normal immune system function should obtain the PPSV23 vaccine dose at least 1 year after the dose of PCV13 vaccine.  Pneumococcal polysaccharide (PPSV23) vaccine. When PCV13 is also indicated, PCV13 should be obtained first. All adults aged 48 years and older should be immunized. An adult younger than age 26 years who has certain medical conditions should be immunized. Any person who resides in a nursing home or long-term care facility should be immunized. An adult smoker should be immunized. People with an immunocompromised condition and certain other conditions should receive both PCV13 and PPSV23 vaccines. People with human immunodeficiency virus (HIV) infection should be immunized as soon as possible after diagnosis. Immunization during chemotherapy or radiation therapy should be avoided. Routine use of PPSV23 vaccine is not recommended for American Indians, [place]atives, or people younger than 65 years unless there are medical conditions that require PPSV23 vaccine. When indicated, people who have unknown immunization and have no record of immunization should receive PPSV23 vaccine. One-time revaccination 5 years after the first dose of PPSV23 is recommended for people aged 19-64 years who have chronic kidney failure, nephrotic  syndrome, asplenia, or immunocompromised conditions. People who received 1-2 doses of PPSV23 before age 79 years should receive another dose of PPSV23 vaccine at age 72 years or later if at least 5 years have passed since the previous dose. Doses of PPSV23 are not needed for people immunized with PPSV23 at or after age 71 years.  Meningococcal vaccine. Adults with asplenia or persistent complement component deficiencies should receive 2 doses of quadrivalent meningococcal conjugate (MenACWY-D)  vaccine. The doses should be obtained at least 2 months apart. Microbiologists working with certain meningococcal bacteria, [place] recruits, people at risk during an outbreak, and people who travel to or live in countries with a high rate of meningitis should be immunized. A first-year college student up through age 62 years who is living in a residence hall should receive a dose if he did not receive a dose on or after his 16th birthday. Adults who have certain high-risk conditions should receive one or more doses of vaccine.  Hepatitis A vaccine. Adults who wish to be protected from this disease, have chronic liver disease, work with hepatitis A-infected animals, work in hepatitis A research labs, or travel to or work in countries with a high rate of hepatitis A should be immunized. Adults who were previously unvaccinated and who anticipate close contact with an international adoptee during the first 60 days after arrival in the [place] from a country with a high rate of hepatitis A should be immunized.  Hepatitis B vaccine. Adults should be immunized if they wish to be protected from this disease, are under age 24 years and have diabetes, have chronic liver disease, have had more than one sex partner in the past 6 months, may be exposed to blood or other infectious body fluids, are household contacts or sex partners of hepatitis B positive people, are clients or workers in certain care facilities, or travel to  or work in countries with a high rate of hepatitis B.  Haemophilus influenzae type b (Hib) vaccine. A previously unvaccinated person with asplenia or sickle cell disease or having a scheduled splenectomy should receive 1 dose of Hib vaccine. Regardless of previous immunization, a recipient of a hematopoietic stem cell transplant should receive a 3-dose series 6-12 months after his successful transplant. Hib vaccine is not recommended for adults with HIV infection. Preventive Service / Frequency Ages 77 to 73  Blood pressure check.** / Every 3-5 years.  Lipid and cholesterol check.** / Every 5 years beginning at age 35.  Hepatitis C blood test.** / For any individual with known risks for hepatitis C.  Skin self-exam. / Monthly.  Influenza vaccine. / Every year.  Tetanus, diphtheria, and acellular pertussis (Tdap, Td) vaccine.** / Consult your health care provider. 1 dose of Td every 10 years.  Varicella vaccine.** / Consult your health care provider.  HPV vaccine. / 3 doses over 6 months, if 37 or younger.  Measles, mumps, rubella (MMR) vaccine.** / You need at least 1 dose of MMR if you were born in 1957 or later. You may also need a second dose.  Pneumococcal 13-valent conjugate (PCV13) vaccine.** / Consult your health care provider.  Pneumococcal polysaccharide (PPSV23) vaccine.** / 1 to 2 doses if you smoke cigarettes or if you have certain conditions.  Meningococcal vaccine.** / 1 dose if you are age 22 to 53 years and a Market researcher living in a residence hall, or have one of several medical conditions. You may also need additional booster doses.  Hepatitis A vaccine.** / Consult your health care provider.  Hepatitis B vaccine.** / Consult your health care provider.  Haemophilus influenzae type b (Hib) vaccine.** / Consult your health care provider. Ages 64 to 27  Blood pressure check.** / Every year.  Lipid and cholesterol check.** / Every 5 years beginning  at age 45.  Lung cancer screening. / Every year if you are aged 66-80 years and have a 30-pack-year history of smoking and currently smoke  or have quit within the past 15 years. Yearly screening is stopped once you have quit smoking for at least 15 years or develop a health problem that would prevent you from having lung cancer treatment.  Fecal occult blood test (FOBT) of stool. / Every year beginning at age 69 and continuing until age 57. You may not have to do this test if you get a colonoscopy every 10 years.  Flexible sigmoidoscopy** or colonoscopy.** / Every 5 years for a flexible sigmoidoscopy or every 10 years for a colonoscopy beginning at age 47 and continuing until age 58.  Hepatitis C blood test.** / For all people born from [birth year] through 1965 and any individual with known risks for hepatitis C.  Skin self-exam. / Monthly.  Influenza vaccine. / Every year.  Tetanus, diphtheria, and acellular pertussis (Tdap/Td) vaccine.** / Consult your health care provider. 1 dose of Td every 10 years.  Varicella vaccine.** / Consult your health care provider.  Zoster vaccine.** / 1 dose for adults aged 71 years or older.  Measles, mumps, rubella (MMR) vaccine.** / You need at least 1 dose of MMR if you were born in 1957 or later. You may also need a second dose.  Pneumococcal 13-valent conjugate (PCV13) vaccine.** / Consult your health care provider.  Pneumococcal polysaccharide (PPSV23) vaccine.** / 1 to 2 doses if you smoke cigarettes or if you have certain conditions.  Meningococcal vaccine.** / Consult your health care provider.  Hepatitis A vaccine.** / Consult your health care provider.  Hepatitis B vaccine.** / Consult your health care provider.  Haemophilus influenzae type b (Hib) vaccine.** / Consult your health care provider. Ages 60 and over  Blood pressure check.** / Every year.  Lipid and cholesterol check.**/ Every 5 years beginning at age 9.  Lung cancer screening.  / Every year if you are aged 44-80 years and have a 30-pack-year history of smoking and currently smoke or have quit within the past 15 years. Yearly screening is stopped once you have quit smoking for at least 15 years or develop a health problem that would prevent you from having lung cancer treatment.  Fecal occult blood test (FOBT) of stool. / Every year beginning at age 66 and continuing until age 50. You may not have to do this test if you get a colonoscopy every 10 years.  Flexible sigmoidoscopy** or colonoscopy.** / Every 5 years for a flexible sigmoidoscopy or every 10 years for a colonoscopy beginning at age 33 and continuing until age 68.  Hepatitis C blood test.** / For all people born from [birth year] through 1964 and any individual with known risks for hepatitis C.  Abdominal aortic aneurysm (AAA) screening.** / A one-time screening for ages 65 to 59 years who are current or former smokers.  Skin self-exam. / Monthly.  Influenza vaccine. / Every year.  Tetanus, diphtheria, and acellular pertussis (Tdap/Td) vaccine.** / 1 dose of Td every 10 years.  Varicella vaccine.** / Consult your health care provider.  Zoster vaccine.** / 1 dose for adults aged 24 years or older.  Pneumococcal 13-valent conjugate (PCV13) vaccine.** / 1 dose for all adults aged 48 years and older.  Pneumococcal polysaccharide (PPSV23) vaccine.** / 1 dose for all adults aged 41 years and older.  Meningococcal vaccine.** / Consult your health care provider.  Hepatitis A vaccine.** / Consult your health care provider.  Hepatitis B vaccine.** / Consult your health care provider.  Haemophilus influenzae type b (Hib) vaccine.** / Consult your health care provider. **Family  history and personal history of risk and conditions may change your health care provider's recommendations.   This information is not intended to replace advice given to you by your health care provider. Make sure you discuss any questions you  have with your health care provider.   Document Released: 05/15/2001 Document Revised: 04/09/2014 Document Reviewed: 08/14/2010 Elsevier Interactive Patient Education [employer].

## 2015-07-18 ENCOUNTER — Telehealth: Payer: Self-pay | Admitting: *Deleted

## 2015-07-18 MED ORDER — ATORVASTATIN CALCIUM 10 MG PO TABS: 10.0000 mg | ORAL_TABLET | Freq: Every day | ORAL | Status: DC

## 2015-07-18 NOTE — Telephone Encounter (Signed)
Spoke w/ pt regarding lab results and he stated that he has been having coughing and reflux after eating, and reflux at night that sometimes wakes him up. He currently takes no medications for GERD/reflux. He reports he discussed this at his last OV and wanted to know if there any recommendations you can make to improve symptoms. Please advise.

## 2015-07-18 NOTE — Telephone Encounter (Signed)
Called patient and [facility] w/ instructions below and including starting Zantac asdetails of GERD diet, including avoid fatty/fried foods, foods high in acid (tomatoes, citrus fruits), caffeinated/carbonated beverages, peppermint, etc as these can exacerbate symptoms. Also advised to avoid eating late at night and before bedtime, and to chew food thoroughly. Instructed pt to return call if any questions and follow up in office w/ [place] in 1-2 weeks if symptoms not resolving.

## 2015-07-18 NOTE — Addendum Note (Signed)
Addended by: Dorrene German on: 07/18/2015 12:47 PM   Modules accepted: Orders, SmartSet

## 2015-07-18 NOTE — Telephone Encounter (Signed)
Can start a Zantac 150 mg BID. Start GERD diet. Follow-up with me if symptoms not resolving over next 1-2 weeks.

## 2015-07-25 ENCOUNTER — Encounter: Payer: Self-pay | Admitting: Diagnostic Neuroimaging

## 2015-07-25 ENCOUNTER — Ambulatory Visit (INDEPENDENT_AMBULATORY_CARE_PROVIDER_SITE_OTHER): Payer: BC Managed Care – PPO | Admitting: Diagnostic Neuroimaging

## 2015-07-25 VITALS — BP 133/85 | HR 80 | Ht 74.0 in | Wt 225.0 lb

## 2015-07-25 DIAGNOSIS — R259 Unspecified abnormal involuntary movements: Secondary | ICD-10-CM | POA: Diagnosis not present

## 2015-07-25 DIAGNOSIS — G518 Other disorders of facial nerve: Secondary | ICD-10-CM | POA: Diagnosis not present

## 2015-07-25 DIAGNOSIS — R253 Fasciculation: Secondary | ICD-10-CM

## 2015-07-25 DIAGNOSIS — G5139 Clonic hemifacial spasm, unspecified: Secondary | ICD-10-CM

## 2015-07-25 DIAGNOSIS — H02401 Unspecified ptosis of right eyelid: Secondary | ICD-10-CM | POA: Diagnosis not present

## 2015-07-25 NOTE — Progress Notes (Signed)
[facility]  PATIENT: Micheal Oliver DOB: 02/26/1951  REFERRING CLINICIAN: Teresita Madura HISTORY FROM: patient  REASON FOR VISIT: new consult    HISTORICAL  CHIEF COMPLAINT:  Chief Complaint  Patient presents with  . Facial Twitching    rm 7, New Pt, "twitching under/in my right eye, x 1-1 1/2 yrs; when I lay on R side, yawn; wake at night with R eye fluttering"    HISTORY OF PRESENT ILLNESS:   65 year old right-handed male here for evaluation of right eye twitching. Symptoms have been present since January 2016 and progressively worsening. Patient has a minor right eye twitch, typically triggered by yawning, once every 3 days. He has a more significant bout of twitching throughout the day once per week. Patient feels twitching in the right eye, inferiorly. He also feels a pulling sensation in his right cheek, especially when he yawns. No problems in the left side of his face. No headaches, vision changes, slurred speech or trouble talking. No hearing changes. No problems with his arms or legs. No prodromal infections, traumas, accidents or other triggering factors.   REVIEW OF SYSTEMS: Full 14 system review of systems performed and negative with exception of: Snoring moles. Interrupted sleep.  ALLERGIES: Allergies  Allergen Reactions  . Bee Venom     Lip swelling    HOME MEDICATIONS: Outpatient Prescriptions Prior to Visit  Medication Sig Dispense Refill  . atorvastatin (LIPITOR) 10 MG tablet Take 1 tablet (10 mg total) by mouth daily. 90 tablet 0  . lisinopril (PRINIVIL,ZESTRIL) 10 MG tablet Take 1 tablet (10 mg total) by mouth daily. 90 tablet 1  . Omega-3 Fatty Acids (FISH OIL) 1200 MG CAPS Take by mouth daily.     No facility-administered medications prior to visit.    PAST MEDICAL HISTORY: Past Medical History  Diagnosis Date  . Chicken pox   . Measles   . Mumps   . Kidney stones     5-6 times    PAST SURGICAL HISTORY: Past Surgical  History  Procedure Laterality Date  . Wisdom tooth extraction  1976-77    FAMILY HISTORY: Family History  Problem Relation Age of Onset  . Alcohol abuse Father 46    Deceased  . Heart disease Father   . Lung disease Father   . Hypertension Father   . Stomach cancer Mother 36    Deceased  . Hypertension Paternal Grandmother   . Dementia Paternal Grandmother   . Stroke Paternal Grandmother   . Arthritis Maternal Grandmother   . Sudden death Paternal Grandfather   . Sudden death Maternal Grandfather   . Heart attack Paternal Aunt   . Other Maternal Aunt     Natural Causes  . Healthy Son   . Anxiety disorder Daughter     SOCIAL HISTORY:  Social History   Social History  . Marital Status: Married    Spouse Name: [name]  . Number of Children: 2  . Years of Education: MBA   Occupational History  .      retired, Banker    Social History Main Topics  . Smoking status: Never Smoker   . Smokeless tobacco: Never Used  . Alcohol Use: No  . Drug Use: No  . Sexual Activity: Yes   Other Topics Concern  . Not on file   Social History Narrative   Lives at home with wife   Caffeine use- sweet tea, 2 glasses a day     PHYSICAL EXAM  GENERAL EXAM/CONSTITUTIONAL:  Vitals:  Filed Vitals:   07/25/15 0922  BP: 133/85  Pulse: 80  Height: '6\' 2"'$  (1.88 m)  Weight: 225 lb (102.059 kg)     Body mass index is 28.88 kg/(m^2).  No exam data present  Patient is in no distress; well developed, nourished and groomed; neck is supple  CARDIOVASCULAR:  Examination of carotid arteries is normal; no carotid bruits  Regular rate and rhythm, no murmurs  Examination of peripheral vascular system by observation and palpation is normal  EYES:  Ophthalmoscopic exam of optic discs and posterior segments is normal; no papilledema or hemorrhages  MUSCULOSKELETAL:  Gait, strength, tone, movements noted in Neurologic exam below  NEUROLOGIC: MENTAL STATUS:  No flowsheet data  found.  awake, alert, oriented to person, place and time  recent and remote memory intact  normal attention and concentration  language fluent, comprehension intact, naming intact,   fund of knowledge appropriate  CRANIAL NERVE:   2nd - no papilledema on fundoscopic exam  2nd, 3rd, 4th, 6th - pupils equal and reactive to light, visual fields full to confrontation, extraocular muscles intact, no nystagmus  5th - facial sensation symmetric  7th - facial strength symmetric --> INTERMITTENT RIGHT EYE LID CLOSURE SPASM/TWITCHING AND RIGHT LOWER FACIAL SPASM  8th - hearing intact  9th - palate elevates symmetrically, uvula midline  11th - shoulder shrug symmetric  12th - tongue protrusion midline  MOTOR:   normal bulk and tone, full strength in the BUE, BLE  SENSORY:   normal and symmetric to light touch, pinprick, temperature, vibration; EXCEPT DECR VIB IN LEFT TOE AND DECR PP IN RIGHT ARM  COORDINATION:   finger-nose-finger, fine finger movements normal  REFLEXES:   deep tendon reflexes present and symmetric  GAIT/STATION:   narrow based gait; able to walk tandem; romberg is negative    DIAGNOSTIC DATA (LABS, IMAGING, TESTING) - I reviewed patient records, labs, notes, testing and imaging myself where available.  Lab Results  Component Value Date   WBC 7.5 07/11/2015   HGB 15.5 07/11/2015   HCT 45.0 07/11/2015   MCV 90.8 07/11/2015   PLT 200.0 07/11/2015      Component Value Date/Time   NA 140 07/11/2015 0835   K 4.8 07/11/2015 0835   CL 105 07/11/2015 0835   CO2 28 07/11/2015 0835   GLUCOSE 138* 07/11/2015 0835   BUN 25* 07/11/2015 0835   CREATININE 1.23 07/11/2015 0835   CREATININE 1.13 10/16/2013 1118   CALCIUM 10.3 07/11/2015 0835   PROT 7.3 07/11/2015 0835   ALBUMIN 4.5 07/11/2015 0835   AST 24 07/11/2015 0835   ALT 47 07/11/2015 0835   ALKPHOS 75 07/11/2015 0835   BILITOT 0.9 07/11/2015 0835   Lab Results  Component Value Date    CHOL 205* 07/11/2015   HDL 34.30* 07/11/2015   LDLCALC 134* 07/11/2015   TRIG 186.0* 07/11/2015   CHOLHDL 6 07/11/2015   Lab Results  Component Value Date   HGBA1C 6.8* 07/11/2015   No results found for: [id number] Lab Results  Component Value Date   TSH 1.48 07/11/2015    01/29/14 abd xray [I reviewed images myself and agree with interpretation. -VRP]  - No dilated loops of bowel are seen to suggest obstruction. There is a moderate amount of stool throughout the colon. Calcifications in the pelvis likely represent phleboliths. Lumbar spondylosis is noted. 11 mm round density in the left lung base may represent a nipple shadow or lung granuloma.  07/11/15 EKG [I reviewed images  myself and agree with interpretation. -VRP]  - normal sinus rhythm    ASSESSMENT AND PLAN  65 y.o. year old male here with new onset right eye twitching, with right lower facial twitching on exam today. Symptoms present since January 2016. Symptoms worsening over time. Most likely represents hemifacial spasm. Will pursue further workup.   Dx: hemifacial spasm (idiopathic vs secondary), benign fasciculations, neuromuscular junction dz, myopathic dz  1. Eyelid twitch   2. Hemifacial spasm   3. Ptosis, right      PLAN:  Orders Placed This Encounter  Procedures  . MR Brain/IAC Wo/W Cm  . Acetylcholine Receptor, Binding  . CK   Return in about 6 weeks (around 09/05/2015).  I reviewed images, labs, notes, records myself. I summarized findings and reviewed with patient, for this condition (right facial spasms) requiring high complexity decision making.    Penni Bombard, MD 11/01/2177, 81:02 AM Certified in Neurology, Neurophysiology and Neuroimaging  [facility] [address], [place], [place] [address] [phone]

## 2015-07-25 NOTE — Patient Instructions (Signed)
Thank you for coming to see [place] at [facility]. I hope we have been able to provide you high quality care today.  You may receive a patient satisfaction survey over the next few weeks. We would appreciate your feedback and comments so that we may continue to improve ourselves and the health of our patients.  - I will check MRI brain (with 7-10 days) - I will check labs today   ~~~~~~~~~~~~~~~~~~~~~~~~~~~~~~~~~~~~~~~~~~~~~~~~~~~~~~~~~~~~~~~~~  DR. Lijah Bourque'S GUIDE TO HAPPY AND HEALTHY LIVING These are some of my general health and wellness recommendations. Some of them may apply to you better than others. Please use common sense as you try these suggestions and feel free to ask me any questions.   ACTIVITY/FITNESS Mental, social, emotional and physical stimulation are very important for brain and body health. Try learning a new activity (arts, music, language, sports, games).  Keep moving your body to the best of your abilities. You can do this at home, inside or outside, the park, community center, gym or anywhere you like. Consider a physical therapist or personal trainer to get started. Consider the app Sworkit. Fitness trackers such as smart-watches, smart-phones or Fitbits can help as well.   NUTRITION Eat more plants: colorful vegetables, nuts, seeds and berries.  Eat less sugar, salt, preservatives and processed foods.  Avoid toxins such as cigarettes and alcohol.  Drink water when you are thirsty. Warm water with a slice of lemon is an excellent morning drink to start the day.  Consider these websites for more information The Nutrition Source (https://www.henry-hernandez.biz/) Precision Nutrition (WindowBlog.ch)   RELAXATION Consider practicing mindfulness meditation or other relaxation techniques such as deep breathing, prayer, yoga, tai chi, massage. See website mindful.org or the apps Headspace or Calm to help  get started.   SLEEP Try to get at least 7-8+ hours sleep per day. Regular exercise and reduced caffeine will help you sleep better. Practice good sleep hygeine techniques. See website sleep.org for more information.   PLANNING Prepare estate planning, living will, healthcare POA documents. Sometimes this is best planned with the help of an attorney. Theconversationproject.org and agingwithdignity.org are excellent resources.

## 2015-07-26 ENCOUNTER — Telehealth: Payer: Self-pay | Admitting: Diagnostic Neuroimaging

## 2015-07-26 LAB — ACETYLCHOLINE RECEPTOR, BINDING: AChR Binding Ab, Serum: <0.03 nmol/L (ref 0.00–0.24)

## 2015-07-26 LAB — CK: Total CK: 100 U/L (ref 24–204)

## 2015-07-26 NOTE — Telephone Encounter (Signed)
Patient has declined the MRI BRAIN W/WO due to cost. Thanks!

## 2015-07-27 ENCOUNTER — Ambulatory Visit (INDEPENDENT_AMBULATORY_CARE_PROVIDER_SITE_OTHER): Payer: BC Managed Care – PPO

## 2015-07-27 ENCOUNTER — Encounter: Payer: Self-pay | Admitting: Podiatry

## 2015-07-27 ENCOUNTER — Ambulatory Visit (INDEPENDENT_AMBULATORY_CARE_PROVIDER_SITE_OTHER): Payer: BC Managed Care – PPO | Admitting: Podiatry

## 2015-07-27 VITALS — BP 133/82 | HR 81 | Resp 18

## 2015-07-27 DIAGNOSIS — Z8249 Family history of ischemic heart disease and other diseases of the circulatory system: Secondary | ICD-10-CM | POA: Diagnosis not present

## 2015-07-27 DIAGNOSIS — L97501 Non-pressure chronic ulcer of other part of unspecified foot limited to breakdown of skin: Secondary | ICD-10-CM

## 2015-07-27 DIAGNOSIS — L84 Corns and callosities: Secondary | ICD-10-CM | POA: Diagnosis not present

## 2015-07-27 LAB — EXERCISE TOLERANCE TEST
CHL CUP RESTING HR STRESS: 79 {beats}/min
CSEPED: 9 min
CSEPEDS: 0 s
CSEPHR: 105 %
Estimated workload: 10.1 METS
MPHR: 156 {beats}/min
Peak HR: 164 {beats}/min
RPE: 15

## 2015-07-27 NOTE — Progress Notes (Signed)
   Subjective:    Patient ID: Micheal Oliver, male    DOB: 1950-04-16, 65 y.o.   MRN: [id number]  HPI  65 year old male presents the also concerns of a callus the right big toe. He cc had a callus there for several years however his opened up twice is concerned about possible infection getting in. Denies any redness or drainage or any swelling. No pain. No recent injury or trauma. No recent treatment other than periodically trimming the callus himself . No other complaints.   Review of Systems  All other systems reviewed and are negative.      Objective:   Physical Exam General: AAO x3, NAD  Dermatological: Hyperkeratotic lesion right anteromedial hallux of the IPJ. Upon debridement there was a very small opening in the skin from where it appears that the callus peeled off. There is no surrounding edema, erythema, drainage or pus. There is no swelling erythema, ascending cellulitis. No swelling to the toe. 3 small callus on the left foot at the same area without any signs of infection.  Vascular: Dorsalis Pedis artery and Posterior Tibial artery pedal pulses are 2/4 bilateral with immedate capillary fill time. Pedal hair growth present. No varicosities and no lower extremity edema present bilateral. There is no pain with calf compression, swelling, warmth, erythema.   Neruologic: Grossly intact via light touch bilateral. Vibratory intact via tuning fork bilateral. Protective threshold with Semmes Wienstein monofilament intact to all pedal sites bilateral. Patellar and Achilles deep tendon reflexes 2+ bilateral. No Babinski or clonus noted bilateral.   Musculoskeletal: No gross boney pedal deformities bilateral. No pain, crepitus, or limitation noted with foot and ankle range of motion bilateral. Muscular strength 5/5 in all groups tested bilateral. Equinus is present.  Gait: Unassisted, Nonantalgic.      Assessment & Plan:  65 year old male right hallux pre-ulcerative callus -Treatment  options discussed including all alternatives, risks, and complications -Etiology of symptoms were discussed -Hyperkeratotic lesion was debrided. Small amount of bleeding occurred from where the skin had peeled off. No signs of infection. And buttock ointment was applied followed by dressing. If not healed within 2 weeks to call the office or sooner if any issues are to arise. Stretching exercises to help with equinus. Monitor for any clinical signs or symptoms of infection and directed to call the office immediately should any occur or go to the ER.  Celesta Gentile, DPM

## 2015-07-27 NOTE — Patient Instructions (Signed)

## 2015-07-28 ENCOUNTER — Encounter: Payer: Self-pay | Admitting: Podiatry

## 2015-08-03 ENCOUNTER — Telehealth: Payer: Self-pay | Admitting: *Deleted

## 2015-08-03 NOTE — Telephone Encounter (Signed)
Spoke with patient and informed him his lab results are normal. He verbalized understanding, appreciation.

## 2015-09-05 ENCOUNTER — Ambulatory Visit: Payer: BC Managed Care – PPO | Admitting: Diagnostic Neuroimaging

## 2015-10-14 ENCOUNTER — Telehealth: Payer: Self-pay | Admitting: Physician Assistant

## 2015-10-14 MED ORDER — ATORVASTATIN CALCIUM 10 MG PO TABS: 10.0000 mg | ORAL_TABLET | Freq: Every day | ORAL | Status: DC

## 2015-10-14 NOTE — Telephone Encounter (Signed)
°  Relationship to patient: Self Can be reached: [phone]  Pharmacy:  [facility] [place] - [address] AT [address] [phone] (Phone) [phone] (Fax)         Reason for call: Request refill on atorvastatin (LIPITOR) 10 MG tablet

## 2015-10-14 NOTE — Telephone Encounter (Signed)
Refill sent per [facility] refill protocol/SLS  

## 2016-01-10 ENCOUNTER — Encounter: Payer: Self-pay | Admitting: Physician Assistant

## 2016-01-10 ENCOUNTER — Ambulatory Visit (INDEPENDENT_AMBULATORY_CARE_PROVIDER_SITE_OTHER): Payer: BC Managed Care – PPO | Admitting: Physician Assistant

## 2016-01-10 ENCOUNTER — Other Ambulatory Visit: Payer: Self-pay | Admitting: Physician Assistant

## 2016-01-10 VITALS — BP 132/82 | HR 79 | Temp 98.1°F | Resp 17 | Ht 76.0 in | Wt 221.4 lb

## 2016-01-10 DIAGNOSIS — I10 Essential (primary) hypertension: Secondary | ICD-10-CM | POA: Diagnosis not present

## 2016-01-10 DIAGNOSIS — Z23 Encounter for immunization: Secondary | ICD-10-CM

## 2016-01-10 DIAGNOSIS — E785 Hyperlipidemia, unspecified: Secondary | ICD-10-CM | POA: Diagnosis not present

## 2016-01-10 LAB — COMPREHENSIVE METABOLIC PANEL
ALK PHOS: 80 U/L (ref 39–117)
ALT: 37 U/L (ref 0–53)
AST: 24 U/L (ref 0–37)
Albumin: 4.2 g/dL (ref 3.5–5.2)
BILIRUBIN TOTAL: 1 mg/dL (ref 0.2–1.2)
BUN: 27 mg/dL — AB (ref 6–23)
CO2: 27 mEq/L (ref 19–32)
CREATININE: 1.31 mg/dL (ref 0.40–1.50)
Calcium: 9.9 mg/dL (ref 8.4–10.5)
Chloride: 105 mEq/L (ref 96–112)
GFR: 58.43 mL/min — AB (ref >60.00)
GLUCOSE: 146 mg/dL — AB (ref 70–99)
Potassium: 4.8 mEq/L (ref 3.5–5.1)
Sodium: 139 mEq/L (ref 135–145)
TOTAL PROTEIN: 7.2 g/dL (ref 6.0–8.3)

## 2016-01-10 LAB — LIPID PANEL
Cholesterol: 134 mg/dL (ref 0–200)
HDL: 31.2 mg/dL — AB (ref >39.00)
NonHDL: 103.16
Total CHOL/HDL Ratio: 4
Triglycerides: 213 mg/dL — ABNORMAL HIGH (ref 0.0–149.0)
VLDL: 42.6 mg/dL — ABNORMAL HIGH (ref 0.0–40.0)

## 2016-01-10 LAB — LDL CHOLESTEROL, DIRECT: LDL DIRECT: 66 mg/dL

## 2016-01-10 MED ORDER — LISINOPRIL 10 MG PO TABS: 10.0000 mg | ORAL_TABLET | Freq: Every day | ORAL | 1 refills | Status: DC

## 2016-01-10 NOTE — Assessment & Plan Note (Signed)
Taking medications as directed. Has made big change to diet and exercise. Will repeat lipid panel and LFTs.

## 2016-01-10 NOTE — Progress Notes (Signed)
Pre visit review using our clinic review tool, if applicable. No additional management support is needed unless otherwise documented below in the visit note. 

## 2016-01-10 NOTE — Progress Notes (Signed)
Patient presents to clinic today for follow-up of hypertension and hyperlipidemia. Is currently on a regimen of Lisinopril 10 mg daily for blood pressure. Patient denies chest pain, palpitations, lightheadedness, dizziness, vision changes or frequent headaches.  BP Readings from Last 3 Encounters:  01/10/16 132/82  07/27/15 133/82  07/25/15 133/85   Patient is taking Atorvastatin and 81 mg ASA daily. Denies side effect of medication. Endorses substantial change to diet -- Has limited intake of salt and is eating more lean protein and vegetables.  Past Medical History:  Diagnosis Date  . Chicken pox   . Kidney stones    5-6 times  . Measles   . Mumps     Current Outpatient Prescriptions on File Prior to Visit  Medication Sig Dispense Refill  . aspirin 81 MG tablet Take 81 mg by mouth daily.    Marland Kitchen atorvastatin (LIPITOR) 10 MG tablet Take 1 tablet (10 mg total) by mouth daily. 90 tablet 0  . Omega-3 Fatty Acids (FISH OIL) 1200 MG CAPS Take by mouth daily.     No current facility-administered medications on file prior to visit.     Allergies  Allergen Reactions  . Bee Venom     Lip swelling    Family History  Problem Relation Age of Onset  . Alcohol abuse Father 60    Deceased  . Heart disease Father   . Lung disease Father   . Hypertension Father   . Stomach cancer Mother 35    Deceased  . Hypertension Paternal Grandmother   . Dementia Paternal Grandmother   . Stroke Paternal Grandmother   . Arthritis Maternal Grandmother   . Sudden death Paternal Grandfather   . Sudden death Maternal Grandfather   . Heart attack Paternal Aunt   . Other Maternal Aunt     Natural Causes  . Healthy Son   . Anxiety disorder Daughter     Social History   Social History  . Marital status: Married    Spouse name: [name]  . Number of children: 2  . Years of education: MBA   Occupational History  .      retired, Banker    Social History Main Topics  . Smoking status: Never  Smoker  . Smokeless tobacco: Never Used  . Alcohol use No  . Drug use: No  . Sexual activity: Yes   Other Topics Concern  . None   Social History Narrative   Lives at home with wife   Caffeine use- sweet tea, 2 glasses a day   Review of Systems - See HPI.  All other ROS are negative.  BP 132/82 (BP Location: Right Arm, Patient Position: Sitting, Cuff Size: Normal)   Pulse 79   Temp 98.1 F (36.7 C) (Oral)   Resp 17   Ht '6\' 4"'$  (1.93 m)   Wt 221 lb 6.4 oz (100.4 kg)   SpO2 99%   BMI 26.95 kg/m   Physical Exam  Constitutional: He is oriented to person, place, and time and well-developed, well-nourished, and in no distress.  HENT:  Head: Normocephalic and atraumatic.  Cardiovascular: Normal rate, regular rhythm, normal heart sounds and intact distal pulses.   Pulmonary/Chest: Effort normal and breath sounds normal. No respiratory distress. He has no wheezes. He has no rales. He exhibits no tenderness.  Neurological: He is alert and oriented to person, place, and time.  Skin: Skin is warm and dry.  Psychiatric: Affect normal.  Vitals reviewed.  Assessment/Plan: Essential hypertension, benign  BP stable. Asymptomatic. Continue current medication reigmen. Will check BMP  Hyperlipidemia Taking medications as directed. Has made big change to diet and exercise. Will repeat lipid panel and LFTs.    Leeanne Rio, PA-C

## 2016-01-10 NOTE — Patient Instructions (Signed)
Please go to the lab for blood work. I will call you with your results. Please continue medications and diet/exercise regimen.  I am glad you are doing well.  Follow-up in 6 months for your physical.

## 2016-01-10 NOTE — Assessment & Plan Note (Addendum)
BP stable. Asymptomatic. Continue current medication reigmen. Will check BMP

## 2016-01-12 NOTE — Addendum Note (Signed)
Addended by: Caffie Pinto on: 01/12/2016 08:59 AM   Modules accepted: Orders

## 2016-03-01 ENCOUNTER — Encounter: Payer: Self-pay | Admitting: Family Medicine

## 2016-03-01 ENCOUNTER — Ambulatory Visit (INDEPENDENT_AMBULATORY_CARE_PROVIDER_SITE_OTHER): Payer: BC Managed Care – PPO | Admitting: Family Medicine

## 2016-03-01 VITALS — BP 112/60 | HR 76 | Temp 98.4°F | Ht 74.0 in | Wt 222.6 lb

## 2016-03-01 DIAGNOSIS — H1031 Unspecified acute conjunctivitis, right eye: Secondary | ICD-10-CM

## 2016-03-01 MED ORDER — POLYMYXIN B-TRIMETHOPRIM 10000-0.1 UNIT/ML-% OP SOLN: 1.0000 [drp] | OPHTHALMIC | 0 refills | Status: DC

## 2016-03-01 NOTE — Patient Instructions (Signed)
Artificial tears, available at most pharmacies, to help flush your eye out. You can use this as much as you wish, there is no medication in it. Let our office know if symptoms worsen or fail to improve.

## 2016-03-01 NOTE — Progress Notes (Signed)
Chief Complaint  Patient presents with  . Eye Injury    Pt reports redness, watering and irrittion to the eye/ Pt reports a tree lim hitting him     Subjective: Patient is a 65 y.o. male here for an eye injury.  2 days ago, the patient was outside working and a branch hit his right eyeball. He experienced immediate pain and redness, however did not decide to seek care until today. His vision is slightly blurry but he should be set to the watering of his eye. He has stinging in the upper right portion of the eye in this area does hurt when he moves his eye. Denies any fevers.  ROS: Eye: as noted in HPI  Family History  Problem Relation Age of Onset  . Alcohol abuse Father 6    Deceased  . Heart disease Father   . Lung disease Father   . Hypertension Father   . Stomach cancer Mother 75    Deceased  . Hypertension Paternal Grandmother   . Dementia Paternal Grandmother   . Stroke Paternal Grandmother   . Arthritis Maternal Grandmother   . Sudden death Paternal Grandfather   . Sudden death Maternal Grandfather   . Heart attack Paternal Aunt   . Other Maternal Aunt     Natural Causes  . Healthy Son   . Anxiety disorder Daughter    Past Medical History:  Diagnosis Date  . Chicken pox   . Kidney stones    5-6 times  . Measles   . Mumps    Allergies  Allergen Reactions  . Bee Venom     Lip swelling    Current Outpatient Prescriptions:  .  aspirin 81 MG tablet, Take 81 mg by mouth daily., Disp: , Rfl:  .  atorvastatin (LIPITOR) 10 MG tablet, TAKE 1 TABLET(10 MG) BY MOUTH DAILY, Disp: 90 tablet, Rfl: 0 .  lisinopril (PRINIVIL,ZESTRIL) 10 MG tablet, Take 1 tablet (10 mg total) by mouth daily., Disp: 90 tablet, Rfl: 1 .  Omega-3 Fatty Acids (FISH OIL) 1200 MG CAPS, Take by mouth daily., Disp: , Rfl:  .  trimethoprim-polymyxin b (POLYTRIM) ophthalmic solution, Place 1 drop into the right eye every 4 (four) hours., Disp: 10 mL, Rfl: 0  Objective: BP 112/60 (BP Location:  Right Arm, Patient Position: Sitting, Cuff Size: Small)   Pulse 76   Temp 98.4 F (36.9 C) (Oral)   Ht '6\' 2"'$  (1.88 m)   Wt 222 lb 9.6 oz (101 kg)   SpO2 97%   BMI 28.58 kg/m  General: Awake, appears stated age HEENT: MMM, EOMi- fluorescein dye exam under black light revealed no corneal abrasion, PERRLA Heart: RRR, no murmurs Lungs: CTAB, no rales, wheezes or rhonchi. No accessory muscle use Psych: Age appropriate judgment and insight, normal affect and mood  Assessment and Plan: Acute conjunctivitis of right eye, unspecified acute conjunctivitis type - Plan: trimethoprim-polymyxin b (POLYTRIM) ophthalmic solution  Orders as above. No evidence of ulceration, globe rupture, glaucoma or orbital cellulitis. Also recommended artificial tears to help soothe the eye. Follow-up if symptoms worsen or fail to improve. The patient voiced understanding and agreement to the plan.  [place], DO 03/01/16  9:15 AM

## 2016-03-01 NOTE — Progress Notes (Signed)
Pre visit review using our clinic review tool, if applicable. No additional management support is needed unless otherwise documented below in the visit note. 

## 2016-04-11 ENCOUNTER — Other Ambulatory Visit: Payer: Self-pay | Admitting: Physician Assistant

## 2016-04-11 MED ORDER — ATORVASTATIN CALCIUM 10 MG PO TABS: 10.0000 mg | ORAL_TABLET | Freq: Every day | ORAL | 0 refills | Status: DC

## 2016-07-11 NOTE — Progress Notes (Addendum)
[facility] [address] [place], [place] [address] [phone]  Date:  07/12/2016   Name:  Micheal Oliver   DOB:  13-Dec-1950   MRN:  [id number]  PCP:  Leeanne Rio, PA-C    Chief Complaint: Annual Exam (Pt here for CPE and is fasting for labs. Will need refills on Atorvastatin and Lisinopril. )   History of Present Illness:  Micheal Oliver is a 66 y.o. very pleasant male patient who presents with the following:  Former pt of Einar Pheasant here today for a CPE History of hyperlipidemia, HTN, ED  Last seen here in October He had a CMP and lipids in October- the rest of his labs are about a year old Recent cholesterol looked much better He recently started swimming at the [facility] using his silver sneakers program.   He has not noted any SE form his medications  BP Readings from Last 3 Encounters:  07/12/16 (!) 120/59  03/01/16 112/60  01/10/16 132/82  he has not noted any sx of hypotension He does have a family history of heart disease- his father had MI x2 and died at 41 yo.  He was a smoker and a drinker however. His aunt died of a CVA His mother died of stomach cancer Due hep C screening and pneumonia vaccine  He is fasting today for labs  PSA is due today Never been a smoker   Patient Active Problem List   Diagnosis Date Noted  . Hyperlipidemia 01/10/2016  . Callus of foot 07/11/2015  . Family history of early CAD 07/11/2015  . Facial twitching 01/21/2015  . Essential hypertension, benign 01/29/2014  . CN (constipation) 01/29/2014  . Visit for preventive health examination 10/16/2013  . Erectile dysfunction 10/16/2013    Past Medical History:  Diagnosis Date  . Chicken pox   . Kidney stones    5-6 times  . Measles   . Mumps     Past Surgical History:  Procedure Laterality Date  . WISDOM TOOTH EXTRACTION  1976-77    Social History  Substance Use Topics  . Smoking status: Never Smoker  .  Smokeless tobacco: Never Used  . Alcohol use No    Family History  Problem Relation Age of Onset  . Alcohol abuse Father 39    Deceased  . Heart disease Father   . Lung disease Father   . Hypertension Father   . Stomach cancer Mother 26    Deceased  . Hypertension Paternal Grandmother   . Dementia Paternal Grandmother   . Stroke Paternal Grandmother   . Arthritis Maternal Grandmother   . Sudden death Paternal Grandfather   . Sudden death Maternal Grandfather   . Heart attack Paternal Aunt   . Other Maternal Aunt     Natural Causes  . Healthy Son   . Anxiety disorder Daughter     Allergies  Allergen Reactions  . Bee Venom     Lip swelling    Medication list has been reviewed and updated.  Current Outpatient Prescriptions on File Prior to Visit  Medication Sig Dispense Refill  . aspirin 81 MG tablet Take 81 mg by mouth daily.    Marland Kitchen atorvastatin (LIPITOR) 10 MG tablet Take 1 tablet (10 mg total) by mouth daily at 6 PM. 90 tablet 0  . lisinopril (PRINIVIL,ZESTRIL) 10 MG tablet Take 1 tablet (10 mg total) by mouth daily. 90 tablet 1  . Omega-3 Fatty Acids (  FISH OIL) 1200 MG CAPS Take by mouth daily.     No current facility-administered medications on file prior to visit.     Review of Systems:  As per HPI- otherwise negative.   Physical Examination: Vitals:   07/12/16 0827  BP: (!) 120/59  Pulse: 77  Temp: 98.4 F (36.9 C)   Vitals:   07/12/16 0827  Weight: 218 lb 6.4 oz (99.1 kg)  Height: '6\' 2"'$  (1.88 m)   Body mass index is 28.04 kg/m. Ideal Body Weight: Weight in (lb) to have BMI = 25: 194.3  GEN: WDWN, NAD, Non-toxic, A & O x 3, normal weight, looks well except he does have a lot of sun damage to his skin    HEENT: Atraumatic, Normocephalic. Neck supple. No masses, No LAD.  Bilateral TM wnl, oropharynx normal.  PEERL,EOMI.   Ears and Nose: No external deformity. CV: RRR, No M/G/R. No JVD. No thrill. No extra heart sounds. PULM: CTA B, no wheezes,  crackles, rhonchi. No retractions. No resp. distress. No accessory muscle use. ABD: S, NT, ND, +BS. No rebound. No HSM. EXTR: No c/c/e NEURO Normal gait.  PSYCH: Normally interactive. Conversant. Not depressed or anxious appearing.  Calm demeanor.    Assessment and Plan: Physical exam  Essential hypertension, benign - Plan: lisinopril (PRINIVIL,ZESTRIL) 10 MG tablet  Hyperlipidemia, unspecified hyperlipidemia type - Plan: atorvastatin (LIPITOR) 10 MG tablet, Lipid panel  Immunization due - Plan: Pneumococcal conjugate vaccine 13-valent IM  Encounter for hepatitis C screening test for low risk patient - Plan: Hepatitis C antibody  Screening for deficiency anemia - Plan: CBC  Screening for diabetes mellitus - Plan: Comprehensive metabolic panel, Hemoglobin A1c  Screening for prostate cancer - Plan: PSA  Here today for a CPE Labs pending as above prevnar today Refilled medications- will have him try a lower dose of lisinopril, if BP continues to be very low he can stop entirely Discussed shingrix  Signed Lamar Blinks, MD  Received his labs  Results for orders placed or performed in visit on 07/12/16  CBC  Result Value Ref Range   WBC 6.8 4.0 - 10.5 K/uL   RBC 5.06 4.22 - 5.81 Mil/uL   Platelets 192.0 150.0 - 400.0 K/uL   Hemoglobin 15.3 13.0 - 17.0 g/dL   HCT 46.6 39.0 - 52.0 %   MCV 92.0 78.0 - 100.0 fl   MCHC 32.9 30.0 - 36.0 g/dL   RDW 13.7 11.5 - 15.5 %  Comprehensive metabolic panel  Result Value Ref Range   Sodium 139 135 - 145 mEq/L   Potassium 4.9 3.5 - 5.1 mEq/L   Chloride 107 96 - 112 mEq/L   CO2 26 19 - 32 mEq/L   Glucose, Bld 159 (H) 70 - 99 mg/dL   BUN 27 (H) 6 - 23 mg/dL   Creatinine, Ser 1.24 0.40 - 1.50 mg/dL   Total Bilirubin 1.0 0.2 - 1.2 mg/dL   Alkaline Phosphatase 78 39 - 117 U/L   AST 16 0 - 37 U/L   ALT 26 0 - 53 U/L   Total Protein 7.3 6.0 - 8.3 g/dL   Albumin 4.7 3.5 - 5.2 g/dL   Calcium 10.1 8.4 - 10.5 mg/dL   GFR 62.16 >60.00  mL/min  Lipid panel  Result Value Ref Range   Cholesterol 120 0 - 200 mg/dL   Triglycerides 146.0 0.0 - 149.0 mg/dL   HDL 35.70 (L) >39.00 mg/dL   VLDL 29.2 0.0 - 40.0 mg/dL   LDL  Cholesterol 55 0 - 99 mg/dL   Total CHOL/HDL Ratio 3    NonHDL 83.86   PSA  Result Value Ref Range   PSA 1.90 0.10 - 4.00 ng/mL  Hemoglobin A1c  Result Value Ref Range   Hgb A1c MFr Bld 7.7 (H) 4.6 - 6.5 %   His A1c is in diabetes territory- message to pt

## 2016-07-12 ENCOUNTER — Encounter: Payer: Self-pay | Admitting: Family Medicine

## 2016-07-12 ENCOUNTER — Ambulatory Visit (INDEPENDENT_AMBULATORY_CARE_PROVIDER_SITE_OTHER): Payer: Medicare Other | Admitting: Family Medicine

## 2016-07-12 VITALS — BP 120/59 | HR 77 | Temp 98.4°F | Ht 74.0 in | Wt 218.4 lb

## 2016-07-12 DIAGNOSIS — Z23 Encounter for immunization: Secondary | ICD-10-CM | POA: Diagnosis not present

## 2016-07-12 DIAGNOSIS — E785 Hyperlipidemia, unspecified: Secondary | ICD-10-CM

## 2016-07-12 DIAGNOSIS — Z131 Encounter for screening for diabetes mellitus: Secondary | ICD-10-CM

## 2016-07-12 DIAGNOSIS — Z0000 Encounter for general adult medical examination without abnormal findings: Secondary | ICD-10-CM | POA: Diagnosis not present

## 2016-07-12 DIAGNOSIS — I10 Essential (primary) hypertension: Secondary | ICD-10-CM | POA: Diagnosis not present

## 2016-07-12 DIAGNOSIS — E119 Type 2 diabetes mellitus without complications: Secondary | ICD-10-CM | POA: Diagnosis not present

## 2016-07-12 DIAGNOSIS — Z1159 Encounter for screening for other viral diseases: Secondary | ICD-10-CM

## 2016-07-12 DIAGNOSIS — Z130 Encounter for screening for diseases of the blood and blood-forming organs and certain disorders involving the immune mechanism: Secondary | ICD-10-CM

## 2016-07-12 DIAGNOSIS — Z125 Encounter for screening for malignant neoplasm of prostate: Secondary | ICD-10-CM

## 2016-07-12 LAB — LIPID PANEL
CHOL/HDL RATIO: 3
Cholesterol: 120 mg/dL (ref 0–200)
HDL: 35.7 mg/dL — ABNORMAL LOW (ref >39.00)
LDL CALC: 55 mg/dL (ref 0–99)
NONHDL: 83.86
Triglycerides: 146 mg/dL (ref 0.0–149.0)
VLDL: 29.2 mg/dL (ref 0.0–40.0)

## 2016-07-12 LAB — CBC
HCT: 46.6 % (ref 39.0–52.0)
Hemoglobin: 15.3 g/dL (ref 13.0–17.0)
MCHC: 32.9 g/dL (ref 30.0–36.0)
MCV: 92 fl (ref 78.0–100.0)
Platelets: 192 10*3/uL (ref 150.0–400.0)
RBC: 5.06 Mil/uL (ref 4.22–5.81)
RDW: 13.7 % (ref 11.5–15.5)
WBC: 6.8 10*3/uL (ref 4.0–10.5)

## 2016-07-12 LAB — COMPREHENSIVE METABOLIC PANEL
ALT: 26 U/L (ref 0–53)
AST: 16 U/L (ref 0–37)
Albumin: 4.7 g/dL (ref 3.5–5.2)
Alkaline Phosphatase: 78 U/L (ref 39–117)
BILIRUBIN TOTAL: 1 mg/dL (ref 0.2–1.2)
BUN: 27 mg/dL — AB (ref 6–23)
CHLORIDE: 107 meq/L (ref 96–112)
CO2: 26 meq/L (ref 19–32)
CREATININE: 1.24 mg/dL (ref 0.40–1.50)
Calcium: 10.1 mg/dL (ref 8.4–10.5)
GFR: 62.16 mL/min (ref >60.00)
GLUCOSE: 159 mg/dL — AB (ref 70–99)
Potassium: 4.9 mEq/L (ref 3.5–5.1)
SODIUM: 139 meq/L (ref 135–145)
Total Protein: 7.3 g/dL (ref 6.0–8.3)

## 2016-07-12 LAB — HEMOGLOBIN A1C: HEMOGLOBIN A1C: 7.7 % — AB (ref 4.6–6.5)

## 2016-07-12 LAB — HEPATITIS C ANTIBODY: HCV AB: NEGATIVE

## 2016-07-12 LAB — PSA: PSA: 1.9 ng/mL (ref 0.10–4.00)

## 2016-07-12 MED ORDER — LISINOPRIL 10 MG PO TABS: 10.0000 mg | ORAL_TABLET | Freq: Every day | ORAL | 3 refills | Status: DC

## 2016-07-12 MED ORDER — ATORVASTATIN CALCIUM 10 MG PO TABS: 10.0000 mg | ORAL_TABLET | Freq: Every day | ORAL | 3 refills | Status: DC

## 2016-07-12 NOTE — Patient Instructions (Signed)
It was a pleasure to see you today- I will be in touch with your labs asap  Keep up the good work with your diet and exercise.  I would also recommend that you have a dermatology skin exam every couple of years.    Try decreasing your lisinopril to a 1/2 tablet ('5mg'$ )- if you find that your BP goes over approx 130/85 increase to 10 mg again  Congratulations to your family on the new baby arriving soon!   You got your "prevnar 13" pneumonia vaccine today. In a year you will be due your "pneumovax 23" booster- and that is it!  Call your insurnace and ask about the "Shingrix" vaccine.  This is the new and improved shingles vaccine which you can get if you like; however you might want to check on the price first

## 2016-07-13 MED ORDER — METFORMIN HCL 500 MG PO TABS: 500.0000 mg | ORAL_TABLET | Freq: Two times a day (BID) | ORAL | 3 refills | Status: DC

## 2016-10-13 NOTE — Progress Notes (Addendum)
[place] at [facility] [address] [place], [place] [address] [phone]  Date:  10/15/2016   Name:  Micheal Oliver   DOB:  Dec 24, 1950   MRN:  [id number]  PCP:  Darreld Mclean, MD    Chief Complaint: Follow-up   History of Present Illness:  Micheal Oliver is a 66 y.o. very pleasant male patient who presents with the following:  History of diabetes, hyperlipidemia, HTN, ED Last seen in April and we started him on some metformin for DM- Your A1c test (used to estimate your average blood sugar over the previous 3 months) has gone up and is now consistent with diabetes. Your diabetes is not severe, but I would recommend that we start an oral medication for you to help bring your blood sugar under better control.  Exercise, losing 5-10 lbs, and eating a diet lower in simple carbs (less bread, rice and pasta- more vegetables and lean protein)- will also help   Please reply to me here and let me know any questions that you may have. If ok with your I will send in an rx for metformin for you to start, and we can plan to see you in 3 months to check on your progress.   Since your diabetes is not severe and you are not on insulin I do not need you to check your home blood sugars, but you certainly can do this if you like   Lab Results  Component Value Date   HGBA1C 7.7 (H) 07/12/2016   He needs a foot exam Eye exam: he does not have this done regularly.    He would like to have shingrix 1 today- however we are out He has been curring his lisinopril in half- at home he is getting BP of approx115/78- 80.  Taking 5mg  daily He is taking 1000 metformin in the am right now   He has cut down on his portion and is eating fewer sweets He is still drinking sweet tea and wonders if this is ok  Lab Results  Component Value Date   PSA 1.90 07/12/2016   PSA 1.74 07/11/2015   PSA 1.74 10/16/2013       BP Readings from Last 3  Encounters:  10/15/16 130/84  07/12/16 (!) 120/59  03/01/16 112/60   Wt Readings from Last 3 Encounters:  10/15/16 215 lb 12.8 oz (97.9 kg)  07/12/16 218 lb 6.4 oz (99.1 kg)  03/01/16 222 lb 9.6 oz (101 kg)     Patient Active Problem List   Diagnosis Date Noted  . Controlled type 2 diabetes mellitus without complication, without long-term current use of insulin ([place]) 07/12/2016  . Hyperlipidemia 01/10/2016  . Callus of foot 07/11/2015  . Family history of early CAD 07/11/2015  . Facial twitching 01/21/2015  . Essential hypertension, benign 01/29/2014  . CN (constipation) 01/29/2014  . Visit for preventive health examination 10/16/2013  . Erectile dysfunction 10/16/2013    Past Medical History:  Diagnosis Date  . Chicken pox   . Kidney stones    5-6 times  . Measles   . Mumps     Past Surgical History:  Procedure Laterality Date  . WISDOM TOOTH EXTRACTION  1976-77    Social History  Substance Use Topics  . Smoking status: Never Smoker  . Smokeless tobacco: Never Used  . Alcohol use No    Family History  Problem Relation Age of Onset  . Alcohol abuse  Father 33       Deceased  . Heart disease Father   . Lung disease Father   . Hypertension Father   . Stomach cancer Mother 22       Deceased  . Hypertension Paternal Grandmother   . Dementia Paternal Grandmother   . Stroke Paternal Grandmother   . Arthritis Maternal Grandmother   . Sudden death Paternal Grandfather   . Sudden death Maternal Grandfather   . Heart attack Paternal Aunt   . Other Maternal Aunt        Natural Causes  . Healthy Son   . Anxiety disorder Daughter     Allergies  Allergen Reactions  . Bee Venom     Lip swelling    Medication list has been reviewed and updated.  Current Outpatient Prescriptions on File Prior to Visit  Medication Sig Dispense Refill  . aspirin 81 MG tablet Take 81 mg by mouth daily.    Marland Kitchen atorvastatin (LIPITOR) 10 MG tablet Take 1 tablet (10 mg total) by  mouth daily at 6 PM. 90 tablet 3  . lisinopril (PRINIVIL,ZESTRIL) 10 MG tablet Take 1 tablet (10 mg total) by mouth daily. 90 tablet 3  . metFORMIN (GLUCOPHAGE) 500 MG tablet Take 1 tablet (500 mg total) by mouth 2 (two) times daily with a meal. 180 tablet 3  . Omega-3 Fatty Acids (FISH OIL) 1200 MG CAPS Take by mouth daily.     No current facility-administered medications on file prior to visit.     Review of Systems:  As per HPI- otherwise negative.   Physical Examination: Vitals:   10/15/16 0856  BP: 130/84  Pulse: 63  Temp: 98.6 F (37 C)   Vitals:   10/15/16 0856  Weight: 215 lb 12.8 oz (97.9 kg)  Height: 6\' 2"  (1.88 m)   Body mass index is 27.71 kg/m. Ideal Body Weight: Weight in (lb) to have BMI = 25: 194.3  GEN: WDWN, NAD, Non-toxic, A & O x 3, mild overweight, looks well HEENT: Atraumatic, Normocephalic. Neck supple. No masses, No LAD. Ears and Nose: No external deformity. CV: RRR, No M/G/R. No JVD. No thrill. No extra heart sounds. PULM: CTA B, no wheezes, crackles, rhonchi. No retractions. No resp. distress. No accessory muscle use. EXTR: No c/c/e NEURO Normal gait.  PSYCH: Normally interactive. Conversant. Not depressed or anxious appearing.  Calm demeanor.  Foot exam- callus right great toe.  Pt admits he tends to cut this with scissors   Assessment and Plan: Controlled type 2 diabetes mellitus without complication, without long-term current use of insulin ([place]) - Plan: Hemoglobin A1c  Essential hypertension, benign - Plan: Basic metabolic panel  Callus of foot  Here today to recheck his DM and BP Discussed diet and need for moderation in some foods, but that nothing is "off limits" BP is reasonable with reduced dose of lisinopril. Will have him continue the 5mg  however for renoprotection He is taking 1000 mg of metformin once a day and tolerating this well.  Continue to do this and we will check his A1c today  Discussed safe management of his calluses-  advised him not to cut with scissors due to risk of injury   Signed Lamar Blinks, MD  Results for orders placed or performed in visit on 10/15/16  Hemoglobin A1c  Result Value Ref Range   Hgb A1c MFr Bld 7.0 (H) 4.6 - 6.5 %  Basic metabolic panel  Result Value Ref Range   Sodium 141 135 -  145 mEq/L   Potassium 4.6 3.5 - 5.1 mEq/L   Chloride 106 96 - 112 mEq/L   CO2 27 19 - 32 mEq/L   Glucose, Bld 135 (H) 70 - 99 mg/dL   BUN 21 6 - 23 mg/dL   Creatinine, Ser 1.11 0.40 - 1.50 mg/dL   Calcium 9.8 8.4 - 10.5 mg/dL   GFR 70.58 >60.00 mL/min   Message to pt- DM control is ok, A1c improved

## 2016-10-15 ENCOUNTER — Encounter: Payer: Self-pay | Admitting: Family Medicine

## 2016-10-15 ENCOUNTER — Ambulatory Visit (INDEPENDENT_AMBULATORY_CARE_PROVIDER_SITE_OTHER): Payer: Medicare Other | Admitting: Family Medicine

## 2016-10-15 VITALS — BP 130/84 | HR 63 | Temp 98.6°F | Ht 74.0 in | Wt 215.8 lb

## 2016-10-15 DIAGNOSIS — I10 Essential (primary) hypertension: Secondary | ICD-10-CM

## 2016-10-15 DIAGNOSIS — L84 Corns and callosities: Secondary | ICD-10-CM | POA: Diagnosis not present

## 2016-10-15 DIAGNOSIS — E119 Type 2 diabetes mellitus without complications: Secondary | ICD-10-CM | POA: Diagnosis not present

## 2016-10-15 LAB — BASIC METABOLIC PANEL
BUN: 21 mg/dL (ref 6–23)
CHLORIDE: 106 meq/L (ref 96–112)
CO2: 27 meq/L (ref 19–32)
Calcium: 9.8 mg/dL (ref 8.4–10.5)
Creatinine, Ser: 1.11 mg/dL (ref 0.40–1.50)
GFR: 70.58 mL/min (ref >60.00)
Glucose, Bld: 135 mg/dL — ABNORMAL HIGH (ref 70–99)
Potassium: 4.6 mEq/L (ref 3.5–5.1)
SODIUM: 141 meq/L (ref 135–145)

## 2016-10-15 LAB — HEMOGLOBIN A1C: HEMOGLOBIN A1C: 7 % — AB (ref 4.6–6.5)

## 2016-10-15 NOTE — Patient Instructions (Addendum)
It was great to see you today!  Take care and I will be in touch with your labs asap and we will see how your A1c has responded.  Please do start having an annual exam exam for diabetic "retinopathy," this is early damage to the retina which can damage your sight if not treated.  Your eye exam can be done by an optometrist.  For your foot callus, you might try an OTC foot treatment which contains urea- this can help to dissolve dead skin   Depending on your A1c we can plan your next visit- likely 4-6 months Sorry that we could not give you your shingles vaccine today- the manufacturer is having a hard time keeping up with demand right now, but the excitement should quiet down soon

## 2017-02-11 DIAGNOSIS — E119 Type 2 diabetes mellitus without complications: Secondary | ICD-10-CM | POA: Diagnosis not present

## 2017-02-17 NOTE — Progress Notes (Addendum)
[place] at [facility] [place], [place], [place] [address] [phone]  Date:  02/18/2017   Name:  Micheal Oliver   DOB:  1951/01/22   MRN:  [id number]  PCP:  Darreld Mclean, MD    Chief Complaint: Follow-up (Pt here for f/u visit. )   History of Present Illness:  Micheal Oliver is a 66 y.o. very pleasant male patient who presents with the following:  Here today for a DM recheck Also history of hyperlipidemia, HTN, ED We diagnosed DM earlier this year, and it has responded well to metformin From our last visit in July-  Discussed diet and need for moderation in some foods, but that nothing is "off limits" BP is reasonable with reduced dose of lisinopril. Will have him continue the 5mg  however for renoprotection He is taking 1000 mg of metformin once a day and tolerating this well.  Continue to do this and we will check his A1c today  Discussed safe management of his calluses- advised him not to cut with scissors due to risk of injury   Lab Results  Component Value Date   HGBA1C 7.0 (H) 10/15/2016   Eye exam: last week Colonoscopy is coming due next year- ? Cologuard.   He would prefer to do colonoscopy.  He saw [facility] in the past and I will refer him back to them A1c due  He is tolerating the metformin well, has not noted any troublesome SE from this medication BP Readings from Last 3 Encounters:  02/18/17 122/82  10/15/16 130/84  07/12/16 (!) 120/59   He continues to have an eyelid twitch- saw neurology last April and they had wanted to do an MR- however at the time he could not afford it - MR Brain/IAC Wo/W Cm He would like to order this when he goes on medicare in January he will let me know when to order it  He has not had any falls, and he does not feel at all depressed His granddaughters are 5, 2 and 2 months old - they are all doing well and lift his spirits  The 65 yo and 80 month old live next door to  them   Patient Active Problem List   Diagnosis Date Noted  . Controlled type 2 diabetes mellitus without complication, without long-term current use of insulin ([place]) 07/12/2016  . Hyperlipidemia 01/10/2016  . Callus of foot 07/11/2015  . Family history of early CAD 07/11/2015  . Facial twitching 01/21/2015  . Essential hypertension, benign 01/29/2014  . CN (constipation) 01/29/2014  . Visit for preventive health examination 10/16/2013  . Erectile dysfunction 10/16/2013    Past Medical History:  Diagnosis Date  . Chicken pox   . Kidney stones    5-6 times  . Measles   . Mumps     Past Surgical History:  Procedure Laterality Date  . WISDOM TOOTH EXTRACTION  1976-77    Social History   Tobacco Use  . Smoking status: Never Smoker  . Smokeless tobacco: Never Used  Substance Use Topics  . Alcohol use: No  . Drug use: No    Family History  Problem Relation Age of Onset  . Alcohol abuse Father 30       Deceased  . Heart disease Father   . Lung disease Father   . Hypertension Father   . Stomach cancer Mother 76       Deceased  . Hypertension Paternal Grandmother   .  Dementia Paternal Grandmother   . Stroke Paternal Grandmother   . Arthritis Maternal Grandmother   . Sudden death Paternal Grandfather   . Sudden death Maternal Grandfather   . Heart attack Paternal Aunt   . Other Maternal Aunt        Natural Causes  . Healthy Son   . Anxiety disorder Daughter     Allergies  Allergen Reactions  . Bee Venom     Lip swelling    Medication list has been reviewed and updated.  Current Outpatient Medications on File Prior to Visit  Medication Sig Dispense Refill  . aspirin 81 MG tablet Take 81 mg by mouth daily.    Marland Kitchen atorvastatin (LIPITOR) 10 MG tablet Take 1 tablet (10 mg total) by mouth daily at 6 PM. 90 tablet 3  . lisinopril (PRINIVIL,ZESTRIL) 10 MG tablet Take 1 tablet (10 mg total) by mouth daily. 90 tablet 3  . metFORMIN (GLUCOPHAGE) 500 MG tablet Take 1  tablet (500 mg total) by mouth 2 (two) times daily with a meal. 180 tablet 3  . Omega-3 Fatty Acids (FISH OIL) 1200 MG CAPS Take by mouth daily.     No current facility-administered medications on file prior to visit.     Review of Systems:  As per HPI- otherwise negative. No fever or chills Positive for right eye twitch No nausea., vomiting No rash or ST   Physical Examination: Vitals:   02/18/17 0942  BP: 122/82  Pulse: 78  Temp: 98.6 F (37 C)  SpO2: 97%   There were no vitals filed for this visit. There is no height or weight on file to calculate BMI. Ideal Body Weight:    GEN: WDWN, NAD, Non-toxic, A & O x 3, looks well HEENT: Atraumatic, Normocephalic. Neck supple. No masses, No LAD.  Bilateral TM wnl, oropharynx normal.  PEERL,EOMI.   Ears and Nose: No external deformity. CV: RRR, No M/G/R. No JVD. No thrill. No extra heart sounds. PULM: CTA B, no wheezes, crackles, rhonchi. No retractions. No resp. distress. No accessory muscle use. ABD: S, NT, ND, +BS. No rebound. No HSM. EXTR: No c/c/e NEURO Normal gait.  PSYCH: Normally interactive. Conversant. Not depressed or anxious appearing.  Calm demeanor.    Assessment and Plan: Controlled type 2 diabetes mellitus without complication, without long-term current use of insulin ([facility]) - Plan: Hemoglobin A1c  Essential hypertension, benign  Screening for colon cancer - Plan: Ambulatory referral to Gastroenterology  Eyelid twitch  Follow-up visit today A1c pending Bp is under control- continue current regimen of lisinopril He is on statin and fish oil for cholesterol- not yet due for a recheck panel Referral to [facility] for colonoscopy which is due next year- 10 year follow-up He will let me know when he needs MRI to be ordered.    Lipids:    Component Value Date/Time   CHOL 120 07/12/2016 0900   TRIG 146.0 07/12/2016 0900   HDL 35.70 (L) 07/12/2016 0900   LDLDIRECT 66.0 01/10/2016 1110   VLDL 29.2 07/12/2016  0900   CHOLHDL 3 07/12/2016 0900     Signed Lamar Blinks, MD  Received his A1c  Results for orders placed or performed in visit on 02/18/17  Hemoglobin A1c  Result Value Ref Range   Hgb A1c MFr Bld 6.7 (H) 4.6 - 6.5 %   Looks good- message to pt

## 2017-02-18 ENCOUNTER — Encounter: Payer: Self-pay | Admitting: Family Medicine

## 2017-02-18 ENCOUNTER — Ambulatory Visit (INDEPENDENT_AMBULATORY_CARE_PROVIDER_SITE_OTHER): Payer: Medicare Other | Admitting: Family Medicine

## 2017-02-18 VITALS — BP 122/82 | HR 78 | Temp 98.6°F

## 2017-02-18 DIAGNOSIS — E119 Type 2 diabetes mellitus without complications: Secondary | ICD-10-CM

## 2017-02-18 DIAGNOSIS — Z1211 Encounter for screening for malignant neoplasm of colon: Secondary | ICD-10-CM | POA: Diagnosis not present

## 2017-02-18 DIAGNOSIS — I10 Essential (primary) hypertension: Secondary | ICD-10-CM | POA: Diagnosis not present

## 2017-02-18 DIAGNOSIS — R253 Fasciculation: Secondary | ICD-10-CM | POA: Diagnosis not present

## 2017-02-18 LAB — HEMOGLOBIN A1C: Hgb A1c MFr Bld: 6.7 % — ABNORMAL HIGH (ref 4.6–6.5)

## 2017-02-18 NOTE — Patient Instructions (Signed)
A pleasure to see you today as always!  Congrats on your new grand-daughter  I will be in touch with your A1c, and will refer you back to [facility] for your colonoscopy next year Let me know when you need your meds refilled and when you would like me to order the MRI for you

## 2017-04-08 DIAGNOSIS — Z86010 Personal history of colonic polyps: Secondary | ICD-10-CM | POA: Diagnosis not present

## 2017-04-08 DIAGNOSIS — K640 First degree hemorrhoids: Secondary | ICD-10-CM | POA: Diagnosis not present

## 2017-04-08 DIAGNOSIS — K5730 Diverticulosis of large intestine without perforation or abscess without bleeding: Secondary | ICD-10-CM | POA: Diagnosis not present

## 2017-04-08 DIAGNOSIS — D126 Benign neoplasm of colon, unspecified: Secondary | ICD-10-CM | POA: Diagnosis not present

## 2017-04-08 DIAGNOSIS — K635 Polyp of colon: Secondary | ICD-10-CM | POA: Diagnosis not present

## 2017-04-11 DIAGNOSIS — K635 Polyp of colon: Secondary | ICD-10-CM | POA: Diagnosis not present

## 2017-04-11 DIAGNOSIS — D126 Benign neoplasm of colon, unspecified: Secondary | ICD-10-CM | POA: Diagnosis not present

## 2017-04-15 ENCOUNTER — Telehealth: Payer: Self-pay | Admitting: Family Medicine

## 2017-04-15 DIAGNOSIS — I10 Essential (primary) hypertension: Secondary | ICD-10-CM

## 2017-04-15 DIAGNOSIS — E785 Hyperlipidemia, unspecified: Secondary | ICD-10-CM

## 2017-04-15 MED ORDER — METFORMIN HCL 500 MG PO TABS: 500.0000 mg | ORAL_TABLET | Freq: Two times a day (BID) | ORAL | 3 refills | Status: DC

## 2017-04-15 MED ORDER — LISINOPRIL 10 MG PO TABS: 10.0000 mg | ORAL_TABLET | Freq: Every day | ORAL | 3 refills | Status: DC

## 2017-04-15 MED ORDER — ATORVASTATIN CALCIUM 10 MG PO TABS: 10.0000 mg | ORAL_TABLET | Freq: Every day | ORAL | 3 refills | Status: DC

## 2017-04-15 NOTE — Telephone Encounter (Signed)
Patient phoned with request for new refills on Atorvastatin, Metformin and Prinivil to be sent to a new pharmacy due to his change of insurance. [facility] pharmacy that is now listed is pharmacy of choice. Transferred these medications to [facility] for Mr. Manley.

## 2017-04-15 NOTE — Telephone Encounter (Signed)
Copied from [place] [phone]. Topic: Quick Communication - Rx Refill/Question >> Apr 15, 2017  8:22 AM Sandi Mariscal E, NT wrote: Medication: metFORMIN (GLUCOPHAGE) 500 MG tablet and lisinopril (PRINIVIL,ZESTRIL) 10 MG tablet   Has the patient contacted their pharmacy? Yes patient changed insurance and they said he needs a new prescription.   (Agent: If no, request that the patient contact the pharmacy for the refill.)   Preferred Pharmacy (with phone number or street name): [facility] [address] - [place], [place] - [address], [address] [phone] (Phone) [phone] (Fax)    Agent: Please be advised that RX refills may take up to 3 business days. We ask that you follow-up with your pharmacy.

## 2017-07-13 NOTE — Progress Notes (Signed)
[facility] [address] [place], [place] [address] [phone]  Date:  07/15/2017   Name:  Micheal Oliver   DOB:  03-Sep-1950   MRN:  [id number]  PCP:  Darreld Mclean, MD    Chief Complaint: Annual Exam   History of Present Illness:  Micheal Oliver is a 67 y.o. very pleasant male patient who presents with the following:  Here today for a CPE Last seen by myself in November.  He was dx with DM last year and is treated with metformin  History of DM, HTN, hyperlipidemia,ED He has not been exercising as much since keeping his grand kids so much.  2 are going to school in the fall so he hopes to be able to do more pickleball then.  Since he is not exercising as much he feels like he does not have as much energy and cannot get as much done  He is not sleeping that well recently- he is not sure why. He does not have known OSA He does have a right eye twitch that has been seen by neurology; they suggested an MRI but not done yet due to cost He does not wish to pursue the MRI for now  He does snore quite a bit per his wife Never had a sleep test as pf uet  Lab Results  Component Value Date   HGBA1C 6.7 (H) 02/18/2017   Labs: full panel due today, he is fasting  Colon: just done in January- he had a lot of polyps and has to repeat in one year which is a bit alarming to him No family history of colon cancer Needs pneumovax- will do for him today Do foot exam today-  Normal pulses, come callus, no skin breakdown.  He is not able to sense about 2 monofilament sites   Home BP 114-125/80-90  From our visit in November:  We diagnosed DM earlier this year, and it has responded well to metformin From our last visit in July-  Discussed diet and need for moderation in some foods, but that nothing is "off limits" BP is reasonable with reduced dose of lisinopril. Will have him continue the 5mg  however for  renoprotection He is taking 1000 mg of metformin once a day and tolerating this well. Continue to do this and we will check his A1c today  Discussed safe management of his calluses- advised him not to cut with scissors due to risk of injury  Eye exam: last week Colonoscopy is coming due next year- ? Cologuard.   He would prefer to do colonoscopy.  He saw [facility] in the past and I will refer him back to them A1c due  He is tolerating the metformin well, has not noted any troublesome SE from this medication     He continues to have an eyelid twitch- saw neurology last April and they had wanted to do an MR- however at the time he could not afford it - MR Brain/IAC Wo/W Cm He would like to order this when he goes on medicare in January he will let me know when to order it He has not had any falls, and he does not feel at all depressed His granddaughters are 5, 2 and 2 months old - they are all doing well and lift his spirits  The 66 yo and 20 month old live next door to them   Patient Active Problem List   Diagnosis  Date Noted  . Controlled type 2 diabetes mellitus without complication, without long-term current use of insulin ([place]) 07/12/2016  . Hyperlipidemia 01/10/2016  . Callus of foot 07/11/2015  . Family history of early CAD 07/11/2015  . Facial twitching 01/21/2015  . Essential hypertension, benign 01/29/2014  . CN (constipation) 01/29/2014  . Visit for preventive health examination 10/16/2013  . Erectile dysfunction 10/16/2013    Past Medical History:  Diagnosis Date  . Chicken pox   . Kidney stones    5-6 times  . Measles   . Mumps     Past Surgical History:  Procedure Laterality Date  . WISDOM TOOTH EXTRACTION  1976-77    Social History   Tobacco Use  . Smoking status: Never Smoker  . Smokeless tobacco: Never Used  Substance Use Topics  . Alcohol use: No  . Drug use: No    Family History  Problem Relation Age of Onset  . Alcohol abuse Father 75        Deceased  . Heart disease Father   . Lung disease Father   . Hypertension Father   . Stomach cancer Mother 8       Deceased  . Hypertension Paternal Grandmother   . Dementia Paternal Grandmother   . Stroke Paternal Grandmother   . Arthritis Maternal Grandmother   . Sudden death Paternal Grandfather   . Sudden death Maternal Grandfather   . Heart attack Paternal Aunt   . Other Maternal Aunt        Natural Causes  . Healthy Son   . Anxiety disorder Daughter     Allergies  Allergen Reactions  . Bee Venom     Lip swelling    Medication list has been reviewed and updated.  Current Outpatient Medications on File Prior to Visit  Medication Sig Dispense Refill  . aspirin 81 MG tablet Take 81 mg by mouth daily.    Marland Kitchen atorvastatin (LIPITOR) 10 MG tablet Take 1 tablet (10 mg total) by mouth daily at 6 PM. 90 tablet 3  . lisinopril (PRINIVIL,ZESTRIL) 10 MG tablet Take 1 tablet (10 mg total) by mouth daily. 90 tablet 3  . metFORMIN (GLUCOPHAGE) 500 MG tablet Take 1 tablet (500 mg total) by mouth 2 (two) times daily with a meal. 180 tablet 3  . Omega-3 Fatty Acids (FISH OIL) 1200 MG CAPS Take by mouth daily.     No current facility-administered medications on file prior to visit.     Review of Systems:  As per HPI- otherwise negative. No CP or SOB No testicular masses or concerns Normal urinary and bowel function  Physical Examination: Vitals:   07/15/17 0927  BP: (!) 144/94  Pulse: 79  Resp: 16  SpO2: 98%   Vitals:   07/15/17 0927  Weight: 217 lb 6.4 oz (98.6 kg)  Height: 6\' 2"  (1.88 m)   Body mass index is 27.91 kg/m. Ideal Body Weight: Weight in (lb) to have BMI = 25: 194.3  GEN: WDWN, NAD, Non-toxic, A & O x 3, normal weight, looks well  HEENT: Atraumatic, Normocephalic. Neck supple. No masses, No LAD.  Bilateral TM wnl, oropharynx normal.  PEERL,EOMI.   Ears and Nose: No external deformity. CV: RRR, No M/G/R. No JVD. No thrill. No extra heart sounds. PULM: CTA  B, no wheezes, crackles, rhonchi. No retractions. No resp. distress. No accessory muscle use. ABD: S, NT, ND. No rebound. No HSM. EXTR: No c/c/e NEURO Normal gait.  Normal strength of extremities.  His right eye lid does twitch periodically  PSYCH: Normally interactive. Conversant. Not depressed or anxious appearing.  Calm demeanor.  Foot exam done today  Assessment and Plan: Physical exam  Hyperlipidemia, unspecified hyperlipidemia type - Plan: Lipid panel  Essential hypertension, benign - Plan: CBC  Controlled type 2 diabetes mellitus without complication, without long-term current use of insulin ([place]) - Plan: Comprehensive metabolic panel, Hemoglobin A1c  Screening for prostate cancer - Plan: PSA  Immunization due - Plan: Pneumococcal polysaccharide vaccine 23-valent greater than or equal to 2yo subcutaneous/IM  Eye twitch - Plan: gabapentin (NEURONTIN) 300 MG capsule  CPE today Labs pending as above Gave pneumovax He has noted an eye twitch that is keeping him awake, and also does tend to have sensitive feet.  Will try gabapentin for him to see if this may help   Signed Lamar Blinks, MD Received his labs, message to pt  Blood count is normal Cholesterol is overall good. Exercise may help to raise your HDL (good cholesterol) Your A1c has gone up a bit since last check Metabolic profile is normal except for glucose PSA looks good- lower than the last 2 years which is always reassuring! Lab Results      Component                Value               Date                      PSA                      1.59                07/15/2017                PSA                      1.90                07/12/2016                PSA                      1.74                07/11/2015           Please try to work on your exercise habits some, and let's visit in 6 months to recheck your A1c.  Let me know how the gabapentin does for you  Results for orders placed or performed in visit on  07/15/17  CBC  Result Value Ref Range   WBC 6.2 4.0 - 10.5 K/uL   RBC 4.53 4.22 - 5.81 Mil/uL   Platelets 181.0 150.0 - 400.0 K/uL   Hemoglobin 14.1 13.0 - 17.0 g/dL   HCT 40.9 39.0 - 52.0 %   MCV 90.3 78.0 - 100.0 fl   MCHC 34.5 30.0 - 36.0 g/dL   RDW 13.5 11.5 - 15.5 %  Comprehensive metabolic panel  Result Value Ref Range   Sodium 142 135 - 145 mEq/L   Potassium 4.6 3.5 - 5.1 mEq/L   Chloride 109 96 - 112 mEq/L   CO2 26 19 - 32 mEq/L   Glucose, Bld 131 (H) 70 - 99 mg/dL   BUN 22 6 - 23 mg/dL   Creatinine, Ser 1.16 0.40 - 1.50 mg/dL  Total Bilirubin 1.0 0.2 - 1.2 mg/dL   Alkaline Phosphatase 75 39 - 117 U/L   AST 15 0 - 37 U/L   ALT 22 0 - 53 U/L   Total Protein 6.6 6.0 - 8.3 g/dL   Albumin 4.2 3.5 - 5.2 g/dL   Calcium 9.5 8.4 - 10.5 mg/dL   GFR 66.92 >60.00 mL/min  Hemoglobin A1c  Result Value Ref Range   Hgb A1c MFr Bld 7.2 (H) 4.6 - 6.5 %  Lipid panel  Result Value Ref Range   Cholesterol 101 0 - 200 mg/dL   Triglycerides 136.0 0.0 - 149.0 mg/dL   HDL 31.40 (L) >39.00 mg/dL   VLDL 27.2 0.0 - 40.0 mg/dL   LDL Cholesterol 42 0 - 99 mg/dL   Total CHOL/HDL Ratio 3    NonHDL 69.36   PSA  Result Value Ref Range   PSA 1.59 0.10 - 4.00 ng/mL

## 2017-07-15 ENCOUNTER — Encounter: Payer: Self-pay | Admitting: Family Medicine

## 2017-07-15 ENCOUNTER — Ambulatory Visit (INDEPENDENT_AMBULATORY_CARE_PROVIDER_SITE_OTHER): Payer: Medicare HMO | Admitting: Family Medicine

## 2017-07-15 VITALS — BP 144/94 | HR 79 | Resp 16 | Ht 74.0 in | Wt 217.4 lb

## 2017-07-15 DIAGNOSIS — E785 Hyperlipidemia, unspecified: Secondary | ICD-10-CM

## 2017-07-15 DIAGNOSIS — Z0000 Encounter for general adult medical examination without abnormal findings: Secondary | ICD-10-CM | POA: Diagnosis not present

## 2017-07-15 DIAGNOSIS — E119 Type 2 diabetes mellitus without complications: Secondary | ICD-10-CM | POA: Diagnosis not present

## 2017-07-15 DIAGNOSIS — G245 Blepharospasm: Secondary | ICD-10-CM | POA: Diagnosis not present

## 2017-07-15 DIAGNOSIS — Z23 Encounter for immunization: Secondary | ICD-10-CM | POA: Diagnosis not present

## 2017-07-15 DIAGNOSIS — I10 Essential (primary) hypertension: Secondary | ICD-10-CM | POA: Diagnosis not present

## 2017-07-15 DIAGNOSIS — Z125 Encounter for screening for malignant neoplasm of prostate: Secondary | ICD-10-CM | POA: Diagnosis not present

## 2017-07-15 LAB — COMPREHENSIVE METABOLIC PANEL
ALK PHOS: 75 U/L (ref 39–117)
ALT: 22 U/L (ref 0–53)
AST: 15 U/L (ref 0–37)
Albumin: 4.2 g/dL (ref 3.5–5.2)
BILIRUBIN TOTAL: 1 mg/dL (ref 0.2–1.2)
BUN: 22 mg/dL (ref 6–23)
CALCIUM: 9.5 mg/dL (ref 8.4–10.5)
CO2: 26 mEq/L (ref 19–32)
CREATININE: 1.16 mg/dL (ref 0.40–1.50)
Chloride: 109 mEq/L (ref 96–112)
GFR: 66.92 mL/min (ref >60.00)
GLUCOSE: 131 mg/dL — AB (ref 70–99)
Potassium: 4.6 mEq/L (ref 3.5–5.1)
Sodium: 142 mEq/L (ref 135–145)
TOTAL PROTEIN: 6.6 g/dL (ref 6.0–8.3)

## 2017-07-15 LAB — LIPID PANEL
CHOLESTEROL: 101 mg/dL (ref 0–200)
HDL: 31.4 mg/dL — ABNORMAL LOW (ref >39.00)
LDL CALC: 42 mg/dL (ref 0–99)
NonHDL: 69.36
TRIGLYCERIDES: 136 mg/dL (ref 0.0–149.0)
Total CHOL/HDL Ratio: 3
VLDL: 27.2 mg/dL (ref 0.0–40.0)

## 2017-07-15 LAB — CBC
HCT: 40.9 % (ref 39.0–52.0)
HEMOGLOBIN: 14.1 g/dL (ref 13.0–17.0)
MCHC: 34.5 g/dL (ref 30.0–36.0)
MCV: 90.3 fl (ref 78.0–100.0)
PLATELETS: 181 10*3/uL (ref 150.0–400.0)
RBC: 4.53 Mil/uL (ref 4.22–5.81)
RDW: 13.5 % (ref 11.5–15.5)
WBC: 6.2 10*3/uL (ref 4.0–10.5)

## 2017-07-15 LAB — HEMOGLOBIN A1C: Hgb A1c MFr Bld: 7.2 % — ABNORMAL HIGH (ref 4.6–6.5)

## 2017-07-15 LAB — PSA: PSA: 1.59 ng/mL (ref 0.10–4.00)

## 2017-07-15 MED ORDER — GABAPENTIN 300 MG PO CAPS: 300.0000 mg | ORAL_CAPSULE | Freq: Every day | ORAL | 6 refills | Status: DC

## 2017-07-15 NOTE — Patient Instructions (Addendum)
Good to see you today- take care and I will be in touch with your labs.  I think we can stay with 5mg  of lisinopril for now,but if your BP is running high you can increase to 10 mg Let's try 300 mg of gabapentin at bedtime; this may reduce your eye twitch and help you sleep.  If no change after a month or so you can stop taking it If you continue to feel tired, let me know and I can order you a sleep study Please do go and visit your dermatologist soon We did your 2nd and last pneumonia booster today  Assuming your labs are ok we can plan to visit in about 6 months   Health Maintenance, Male A healthy lifestyle and preventive care is important for your health and wellness. Ask your health care provider about what schedule of regular examinations is right for you. What should I know about weight and diet? Eat a Healthy Diet  Eat plenty of vegetables, fruits, whole grains, low-fat dairy products, and lean protein.  Do not eat a lot of foods high in solid fats, added sugars, or salt.  Maintain a Healthy Weight Regular exercise can help you achieve or maintain a healthy weight. You should:  Do at least 150 minutes of exercise each week. The exercise should increase your heart rate and make you sweat (moderate-intensity exercise).  Do strength-training exercises at least twice a week.  Watch Your Levels of Cholesterol and Blood Lipids  Have your blood tested for lipids and cholesterol every 5 years starting at 66 years of age. If you are at high risk for heart disease, you should start having your blood tested when you are 66 years old. You may need to have your cholesterol levels checked more often if: ? Your lipid or cholesterol levels are high. ? You are older than 66 years of age. ? You are at high risk for heart disease.  What should I know about cancer screening? Many types of cancers can be detected early and may often be prevented. Lung Cancer  You should be screened every year  for lung cancer if: ? You are a current smoker who has smoked for at least 30 years. ? You are a former smoker who has quit within the past 15 years.  Talk to your health care provider about your screening options, when you should start screening, and how often you should be screened.  Colorectal Cancer  Routine colorectal cancer screening usually begins at 66 years of age and should be repeated every 5-10 years until you are 66 years old. You may need to be screened more often if early forms of precancerous polyps or small growths are found. Your health care provider may recommend screening at an earlier age if you have risk factors for colon cancer.  Your health care provider may recommend using home test kits to check for hidden blood in the stool.  A small camera at the end of a tube can be used to examine your colon (sigmoidoscopy or colonoscopy). This checks for the earliest forms of colorectal cancer.  Prostate and Testicular Cancer  Depending on your age and overall health, your health care provider may do certain tests to screen for prostate and testicular cancer.  Talk to your health care provider about any symptoms or concerns you have about testicular or prostate cancer.  Skin Cancer  Check your skin from head to toe regularly.  Tell your health care provider about any  new moles or changes in moles, especially if: ? There is a change in a mole's size, shape, or color. ? You have a mole that is larger than a pencil eraser.  Always use sunscreen. Apply sunscreen liberally and repeat throughout the day.  Protect yourself by wearing long sleeves, pants, a wide-brimmed hat, and sunglasses when outside.  What should I know about heart disease, diabetes, and high blood pressure?  If you are 43-53 years of age, have your blood pressure checked every 3-5 years. If you are 40 years of age or older, have your blood pressure checked every year. You should have your blood pressure  measured twice-once when you are at a hospital or clinic, and once when you are not at a hospital or clinic. Record the average of the two measurements. To check your blood pressure when you are not at a hospital or clinic, you can use: ? An automated blood pressure machine at a pharmacy. ? A home blood pressure monitor.  Talk to your health care provider about your target blood pressure.  If you are between 67-100 years old, ask your health care provider if you should take aspirin to prevent heart disease.  Have regular diabetes screenings by checking your fasting blood sugar level. ? If you are at a normal weight and have a low risk for diabetes, have this test once every three years after the age of 89. ? If you are overweight and have a high risk for diabetes, consider being tested at a younger age or more often.  A one-time screening for abdominal aortic aneurysm (AAA) by ultrasound is recommended for men aged 36-75 years who are current or former smokers. What should I know about preventing infection? Hepatitis B If you have a higher risk for hepatitis B, you should be screened for this virus. Talk with your health care provider to find out if you are at risk for hepatitis B infection. Hepatitis C Blood testing is recommended for:  Everyone born from [birth year] through 1965.  Anyone with known risk factors for hepatitis C.  Sexually Transmitted Diseases (STDs)  You should be screened each year for STDs including gonorrhea and chlamydia if: ? You are sexually active and are younger than 66 years of age. ? You are older than 66 years of age and your health care provider tells you that you are at risk for this type of infection. ? Your sexual activity has changed since you were last screened and you are at an increased risk for chlamydia or gonorrhea. Ask your health care provider if you are at risk.  Talk with your health care provider about whether you are at high risk of being infected  with HIV. Your health care provider may recommend a prescription medicine to help prevent HIV infection.  What else can I do?  Schedule regular health, dental, and eye exams.  Stay current with your vaccines (immunizations).  Do not use any tobacco products, such as cigarettes, chewing tobacco, and e-cigarettes. If you need help quitting, ask your health care provider.  Limit alcohol intake to no more than 2 drinks per day. One drink equals 12 ounces of beer, 5 ounces of wine, or 1 ounces of hard liquor.  Do not use street drugs.  Do not share needles.  Ask your health care provider for help if you need support or information about quitting drugs.  Tell your health care provider if you often feel depressed.  Tell your health care provider if  you have ever been abused or do not feel safe at home. This information is not intended to replace advice given to you by your health care provider. Make sure you discuss any questions you have with your health care provider. Document Released: 09/15/2007 Document Revised: 11/16/2015 Document Reviewed: 12/21/2014 Elsevier Interactive Patient Education  [employer].

## 2017-08-12 DIAGNOSIS — R69 Illness, unspecified: Secondary | ICD-10-CM | POA: Diagnosis not present

## 2017-12-23 DIAGNOSIS — R69 Illness, unspecified: Secondary | ICD-10-CM | POA: Diagnosis not present

## 2017-12-26 NOTE — Progress Notes (Addendum)
[facility] Oliver at [facility] [address] [place], [place] [address] [phone]  Date:  12/30/2017   Name:  Micheal Oliver   DOB:  [birth year]   MRN:  [id number]  PCP:  Darreld Mclean, MD    Chief Complaint: Diarrhea (Pt here for 6 month follow up )   History of Present Illness:  Micheal Oliver is a 67 y.o. very pleasant male patient who presents with the following:  6 month followup visit today History of DM, hyperlipidemia, HTN Last seen here in April for a CPE: Gave pneumovax He has noted an eye twitch that is keeping him awake, and also does tend to have sensitive feet.  Will try gabapentin for him to see if this may help //////////////////// Blood count is normal Cholesterol is overall good. Exercise may help to raise your HDL (good cholesterol) Your A1c has gone up a bit since last check Metabolic profile is normal except for glucose PSA looks good- lower than the last 2 years which is always reassuring!  He was dx with DM in 2018- treated with metformin   They took care of his grands this summer- 1, 3 and nearly 15 yo.  They kept them very busy!  He does feel like the gabapentin helps with his eye twitch and with getting to sleep His eye twitch is about the same  He will have his eye exam coming up soon and will mention this as well   He has had this right eye twitch for approx 7-8 years . It will wax and wane, not there some days Not worse with fatigue He is not aware of any pattern here Does not affect his reading or vision   He has started pickle ball- this is pretty strenuous. No CP or SOB with exercise   Lab Results  Component Value Date   HGBA1C 7.2 (H) 07/15/2017   Foot exam due Flu shot due- pt had last week   Lisinopril lipitor Gabapentin Metformin Asa Fish oil  Patient Active Problem List   Diagnosis Date Noted  . Controlled type 2 diabetes mellitus without complication, without long-term  current use of insulin ([place]) 07/12/2016  . Hyperlipidemia 01/10/2016  . Callus of foot 07/11/2015  . Family history of early CAD 07/11/2015  . Facial twitching 01/21/2015  . Essential hypertension, benign 01/29/2014  . CN (constipation) 01/29/2014  . Visit for preventive health examination 10/16/2013  . Erectile dysfunction 10/16/2013    Past Medical History:  Diagnosis Date  . Chicken pox   . Kidney stones    5-6 times  . Measles   . Mumps     Past Surgical History:  Procedure Laterality Date  . WISDOM TOOTH EXTRACTION  1976-77    Social History   Tobacco Use  . Smoking status: Never Smoker  . Smokeless tobacco: Never Used  Substance Use Topics  . Alcohol use: No  . Drug use: No    Family History  Problem Relation Age of Onset  . Alcohol abuse Father 19       Deceased  . Heart disease Father   . Lung disease Father   . Hypertension Father   . Stomach cancer Mother 36       Deceased  . Hypertension Paternal Grandmother   . Dementia Paternal Grandmother   . Stroke Paternal Grandmother   . Arthritis Maternal Grandmother   . Sudden death Paternal Grandfather   . Sudden death Maternal  Grandfather   . Heart attack Paternal Aunt   . Other Maternal Aunt        Natural Causes  . Healthy Son   . Anxiety disorder Daughter     Allergies  Allergen Reactions  . Bee Venom     Lip swelling    Medication list has been reviewed and updated.  Current Outpatient Medications on File Prior to Visit  Medication Sig Dispense Refill  . aspirin 81 MG tablet Take 81 mg by mouth daily.    Marland Kitchen atorvastatin (LIPITOR) 10 MG tablet Take 1 tablet (10 mg total) by mouth daily at 6 PM. 90 tablet 3  . gabapentin (NEURONTIN) 300 MG capsule Take 1 capsule (300 mg total) by mouth at bedtime. 30 capsule 6  . lisinopril (PRINIVIL,ZESTRIL) 10 MG tablet Take 1 tablet (10 mg total) by mouth daily. 90 tablet 3  . metFORMIN (GLUCOPHAGE) 500 MG tablet Take 1 tablet (500 mg total) by mouth 2  (two) times daily with a meal. 180 tablet 3  . Omega-3 Fatty Acids (FISH OIL) 1200 MG CAPS Take by mouth daily.     No current facility-administered medications on file prior to visit.     Review of Systems:  As per HPI- otherwise negative. No fever or chills No LE edema    Physical Examination: Vitals:   12/30/17 0825  BP: 127/77  Pulse: 79  Resp: 16  Temp: 98.4 F (36.9 C)  SpO2: 98%   Vitals:   12/30/17 0825  Weight: 213 lb 3.2 oz (96.7 kg)  Height: 6\' 2"  (1.88 m)   Body mass index is 27.37 kg/m. Ideal Body Weight: Weight in (lb) to have BMI = 25: 194.3  GEN: WDWN, NAD, Non-toxic, A & O x 3, normal weight, looks well  HEENT: Atraumatic, Normocephalic. Neck supple. No masses, No LAD.  Bilateral TM wnl, oropharynx normal.  PEERL,EOMI.   Twitch of right eye is intermittent Normal movement of facial muscles and normal facial sensation  Ears and Nose: No external deformity. CV: RRR, No M/G/R. No JVD. No thrill. No extra heart sounds. PULM: CTA B, no wheezes, crackles, rhonchi. No retractions. No resp. distress. No accessory muscle use. EXTR: No c/c/e NEURO Normal gait.  PSYCH: Normally interactive. Conversant. Not depressed or anxious appearing.  Calm demeanor.  Foot exam done today    Assessment and Plan: Hyperlipidemia, unspecified hyperlipidemia type  Essential hypertension, benign  Controlled type 2 diabetes mellitus without complication, without long-term current use of insulin ([place]) - Plan: Hemoglobin J6B, Basic metabolic panel  Multiple nevi - Plan: Ambulatory referral to Dermatology  Eyelid twitch  Type 2 diabetes mellitus with diabetic neuropathy, without long-term current use of insulin ([place])  Following up today Continue lipid meds BP well controlled, check BMP Check A1c Pt would like to see derm- did referral for him today Eye lid twitch- less bothersome with gabapentin at bedtime  Offered neurology referral but he declines for now Some  neuropathy- feet otherwise look good, great pulses. He protects his feet with shoes while at home as well- slippers Follow-up in 6 months    Signed Lamar Blinks, MD  Received his labs, message to pt: DM under ok control Recheck in 6 months  Results for orders placed or performed in visit on 12/30/17  Hemoglobin A1c  Result Value Ref Range   Hgb A1c MFr Bld 7.1 (H) 4.6 - 6.5 %  Basic metabolic panel  Result Value Ref Range   Sodium 140 135 - 145  mEq/L   Potassium 5.1 3.5 - 5.1 mEq/L   Chloride 108 96 - 112 mEq/L   CO2 26 19 - 32 mEq/L   Glucose, Bld 148 (H) 70 - 99 mg/dL   BUN 30 (H) 6 - 23 mg/dL   Creatinine, Ser 1.19 0.40 - 1.50 mg/dL   Calcium 10.0 8.4 - 10.5 mg/dL   GFR 64.89 >60.00 mL/min

## 2017-12-30 ENCOUNTER — Ambulatory Visit (INDEPENDENT_AMBULATORY_CARE_PROVIDER_SITE_OTHER): Payer: Medicare HMO | Admitting: Family Medicine

## 2017-12-30 ENCOUNTER — Encounter: Payer: Self-pay | Admitting: Family Medicine

## 2017-12-30 VITALS — BP 127/77 | HR 79 | Temp 98.4°F | Resp 16 | Ht 74.0 in | Wt 213.2 lb

## 2017-12-30 DIAGNOSIS — E1140 Type 2 diabetes mellitus with diabetic neuropathy, unspecified: Secondary | ICD-10-CM

## 2017-12-30 DIAGNOSIS — D229 Melanocytic nevi, unspecified: Secondary | ICD-10-CM

## 2017-12-30 DIAGNOSIS — R253 Fasciculation: Secondary | ICD-10-CM

## 2017-12-30 DIAGNOSIS — I10 Essential (primary) hypertension: Secondary | ICD-10-CM

## 2017-12-30 DIAGNOSIS — E785 Hyperlipidemia, unspecified: Secondary | ICD-10-CM | POA: Diagnosis not present

## 2017-12-30 DIAGNOSIS — E119 Type 2 diabetes mellitus without complications: Secondary | ICD-10-CM | POA: Diagnosis not present

## 2017-12-30 LAB — BASIC METABOLIC PANEL
BUN: 30 mg/dL — ABNORMAL HIGH (ref 6–23)
CALCIUM: 10 mg/dL (ref 8.4–10.5)
CO2: 26 meq/L (ref 19–32)
Chloride: 108 mEq/L (ref 96–112)
Creatinine, Ser: 1.19 mg/dL (ref 0.40–1.50)
GFR: 64.89 mL/min (ref >60.00)
GLUCOSE: 148 mg/dL — AB (ref 70–99)
POTASSIUM: 5.1 meq/L (ref 3.5–5.1)
SODIUM: 140 meq/L (ref 135–145)

## 2017-12-30 LAB — HEMOGLOBIN A1C: Hgb A1c MFr Bld: 7.1 % — ABNORMAL HIGH (ref 4.6–6.5)

## 2017-12-30 NOTE — Patient Instructions (Addendum)
Great to see you as always!  I will be in touch with your labs and we can plan for a physical in 6 months.  We placed a referral to dermatology for you Let me know if you do want to see neurology about your eye twitch!

## 2018-02-03 DIAGNOSIS — D225 Melanocytic nevi of trunk: Secondary | ICD-10-CM | POA: Diagnosis not present

## 2018-02-03 DIAGNOSIS — L851 Acquired keratosis [keratoderma] palmaris et plantaris: Secondary | ICD-10-CM | POA: Diagnosis not present

## 2018-02-03 DIAGNOSIS — L821 Other seborrheic keratosis: Secondary | ICD-10-CM | POA: Diagnosis not present

## 2018-02-03 DIAGNOSIS — L918 Other hypertrophic disorders of the skin: Secondary | ICD-10-CM | POA: Diagnosis not present

## 2018-02-12 ENCOUNTER — Other Ambulatory Visit: Payer: Self-pay | Admitting: Family Medicine

## 2018-02-12 DIAGNOSIS — G245 Blepharospasm: Secondary | ICD-10-CM

## 2018-02-12 MED ORDER — GABAPENTIN 300 MG PO CAPS: 300.0000 mg | ORAL_CAPSULE | Freq: Every day | ORAL | 1 refills | Status: DC

## 2018-02-12 NOTE — Telephone Encounter (Signed)
Requested Prescriptions  Pending Prescriptions Disp Refills  . gabapentin (NEURONTIN) 300 MG capsule 90 capsule 1    Sig: Take 1 capsule (300 mg total) by mouth at bedtime.     Neurology: Anticonvulsants - gabapentin Passed - 02/12/2018  8:55 AM      Passed - Valid encounter within last 12 months    Recent Outpatient Visits          1 month ago Hyperlipidemia, unspecified hyperlipidemia type   [facility], MD   7 months ago Physical exam   [facility], MD   11 months ago Controlled type 2 diabetes mellitus without complication, without long-term current use of insulin ([place])   [facility], Harrold C, MD   1 year ago Controlled type 2 diabetes mellitus without complication, without long-term current use of insulin ([place])   [facility], Gay Filler, MD   1 year ago Physical exam   [facility], Gay Filler, MD      Future Appointments            In 5 months [place], Gay Filler, MD [facility]

## 2018-02-12 NOTE — Telephone Encounter (Signed)
Copied from [place] [id number]. Topic: Quick Communication - See Telephone Encounter >> Feb 12, 2018  8:45 AM Vernona Rieger wrote: CRM for notification. See Telephone encounter for: 02/12/18.  gabapentin (NEURONTIN) 300 MG capsule ( would like enough refills to get him until his next appt in April ), tried to refill through [facility], would not allow.  [facility] [address] - [place], [place] - [address] [place] [address]

## 2018-02-17 DIAGNOSIS — E119 Type 2 diabetes mellitus without complications: Secondary | ICD-10-CM | POA: Diagnosis not present

## 2018-03-03 DIAGNOSIS — R69 Illness, unspecified: Secondary | ICD-10-CM | POA: Diagnosis not present

## 2018-03-17 DIAGNOSIS — L820 Inflamed seborrheic keratosis: Secondary | ICD-10-CM | POA: Diagnosis not present

## 2018-03-17 DIAGNOSIS — L821 Other seborrheic keratosis: Secondary | ICD-10-CM | POA: Diagnosis not present

## 2018-03-19 DIAGNOSIS — D229 Melanocytic nevi, unspecified: Secondary | ICD-10-CM | POA: Diagnosis not present

## 2018-04-07 ENCOUNTER — Other Ambulatory Visit: Payer: Self-pay | Admitting: Family Medicine

## 2018-04-07 NOTE — Telephone Encounter (Signed)
Copied from [place] [phone]. Topic: Quick Communication - Rx Refill/Question >> Apr 07, 2018 11:37 AM Alanda Slim E wrote: Medication: metFORMIN (GLUCOPHAGE) 500 MG tablet  (Pt requested enough to last until his April appt)   Has the patient contacted their pharmacy? Yes (no refills)   Preferred Pharmacy (with phone number or street name): [facility] [address] - [place], [place] - [address], [address] [phone] (Phone) [phone] (Fax)    Agent: Please be advised that RX refills may take up to 3 business days. We ask that you follow-up with your pharmacy.

## 2018-04-08 MED ORDER — METFORMIN HCL 500 MG PO TABS: 500.0000 mg | ORAL_TABLET | Freq: Two times a day (BID) | ORAL | 3 refills | Status: DC

## 2018-06-09 DIAGNOSIS — Z86010 Personal history of colonic polyps: Secondary | ICD-10-CM | POA: Diagnosis not present

## 2018-06-09 DIAGNOSIS — D124 Benign neoplasm of descending colon: Secondary | ICD-10-CM | POA: Diagnosis not present

## 2018-06-09 DIAGNOSIS — D122 Benign neoplasm of ascending colon: Secondary | ICD-10-CM | POA: Diagnosis not present

## 2018-06-09 DIAGNOSIS — D120 Benign neoplasm of cecum: Secondary | ICD-10-CM | POA: Diagnosis not present

## 2018-06-09 DIAGNOSIS — K5730 Diverticulosis of large intestine without perforation or abscess without bleeding: Secondary | ICD-10-CM | POA: Diagnosis not present

## 2018-06-09 DIAGNOSIS — K640 First degree hemorrhoids: Secondary | ICD-10-CM | POA: Diagnosis not present

## 2018-06-11 DIAGNOSIS — D124 Benign neoplasm of descending colon: Secondary | ICD-10-CM | POA: Diagnosis not present

## 2018-06-11 DIAGNOSIS — D120 Benign neoplasm of cecum: Secondary | ICD-10-CM | POA: Diagnosis not present

## 2018-06-11 DIAGNOSIS — D122 Benign neoplasm of ascending colon: Secondary | ICD-10-CM | POA: Diagnosis not present

## 2018-06-23 ENCOUNTER — Telehealth: Payer: Self-pay | Admitting: *Deleted

## 2018-06-23 NOTE — Telephone Encounter (Signed)
Received Colonoscopy Results from [facility]; forwarded to provider/SLS 03/23

## 2018-06-26 ENCOUNTER — Other Ambulatory Visit: Payer: Self-pay | Admitting: Family Medicine

## 2018-06-26 DIAGNOSIS — E785 Hyperlipidemia, unspecified: Secondary | ICD-10-CM

## 2018-06-27 MED ORDER — ATORVASTATIN CALCIUM 10 MG PO TABS: 10.0000 mg | ORAL_TABLET | Freq: Every day | ORAL | 1 refills | Status: DC

## 2018-07-21 ENCOUNTER — Encounter: Payer: Medicare HMO | Admitting: Family Medicine

## 2018-08-01 ENCOUNTER — Other Ambulatory Visit: Payer: Self-pay | Admitting: Family Medicine

## 2018-08-01 DIAGNOSIS — I10 Essential (primary) hypertension: Secondary | ICD-10-CM

## 2018-08-26 ENCOUNTER — Other Ambulatory Visit: Payer: Self-pay | Admitting: Family Medicine

## 2018-08-26 DIAGNOSIS — G245 Blepharospasm: Secondary | ICD-10-CM

## 2018-08-26 MED ORDER — GABAPENTIN 300 MG PO CAPS: 300.0000 mg | ORAL_CAPSULE | Freq: Every day | ORAL | 0 refills | Status: DC

## 2018-08-26 NOTE — Telephone Encounter (Signed)
Copied from [place] [phone]. Topic: Quick Communication - Rx Refill/Question >> Aug 26, 2018 10:03 AM Mathis Bud wrote: Medication: gabapentin (NEURONTIN) 300 MG   Has the patient contacted their pharmacy? Yes, no refills  Preferred Pharmacy (with phone number or street name): [facility] [address] - [place], [place] - [address] [phone] (Phone) [phone] (Fax)    Agent: Please be advised that RX refills may take up to 3 business days. We ask that you follow-up with your pharmacy.

## 2018-08-26 NOTE — Telephone Encounter (Signed)
Refills sent

## 2018-09-22 DIAGNOSIS — R69 Illness, unspecified: Secondary | ICD-10-CM | POA: Diagnosis not present

## 2018-09-28 DIAGNOSIS — R253 Fasciculation: Secondary | ICD-10-CM | POA: Insufficient documentation

## 2018-09-28 NOTE — Progress Notes (Addendum)
[facility] [place], [place], [place] [address] [phone]  Date:  09/29/2018   Name:  Micheal Oliver   DOB:  Apr 14, 1950   MRN:  [id number]  PCP:  Darreld Mclean, MD    Chief Complaint: Annual Exam   History of Present Illness:  Micheal Oliver is a 68 y.o. very pleasant male patient who presents with the following:  Here today for a CPE History of DM, hyperlipidemia and HTN Last seen here in September of last year   He has been bothered by an eyelid twitch and did see neurology last year  I tried him on some gabapentin for same- he takes this at bedtime and it does help with sleep The eyelid twitch is much better and not as bothersome as it was   He has 3 granddaughters- they are spending some time with 2 of them but their mom is home from work He wonders if he can play picklebaloutdoors -   Labs: now due - he is fasting today for labs  Colon: 06/2018- 3 year recall  Immun:  All UTD, too soon for shingrix  Foot exam: due now  Eye exam: done in December   Asa 81 lipitor 10 Gabapentin Lisinopril 10 metfromin 500 BID  No CP or SOB with activity He has not checked his BP at home He has not gained any weight during the pandemic which makes him happy   He does not check his blood sugars at home.  He has not noted any sx of hypoglycemia   He does drink some regular sodas - mtn dew mixed with sparking water  Wt Readings from Last 3 Encounters:  09/29/18 212 lb (96.2 kg)  12/30/17 213 lb 3.2 oz (96.7 kg)  07/15/17 217 lb 6.4 oz (98.6 kg)     Lab Results  Component Value Date   HGBA1C 7.1 (H) 12/30/2017   BP Readings from Last 3 Encounters:  09/29/18 136/82  12/30/17 127/77  07/15/17 (!) 144/94     Patient Active Problem List   Diagnosis Date Noted  . Eyelid twitch 09/28/2018  . Controlled type 2 diabetes mellitus without complication, without long-term current use of insulin ([facility])  07/12/2016  . Hyperlipidemia 01/10/2016  . Family history of early CAD 07/11/2015  . Essential hypertension, benign 01/29/2014  . Erectile dysfunction 10/16/2013    Past Medical History:  Diagnosis Date  . Chicken pox   . Kidney stones    5-6 times  . Measles   . Mumps     Past Surgical History:  Procedure Laterality Date  . WISDOM TOOTH EXTRACTION  1976-77    Social History   Tobacco Use  . Smoking status: Never Smoker  . Smokeless tobacco: Never Used  Substance Use Topics  . Alcohol use: No  . Drug use: No    Family History  Problem Relation Age of Onset  . Alcohol abuse Father 64       Deceased  . Heart disease Father   . Lung disease Father   . Hypertension Father   . Stomach cancer Mother 74       Deceased  . Hypertension Paternal Grandmother   . Dementia Paternal Grandmother   . Stroke Paternal Grandmother   . Arthritis Maternal Grandmother   . Sudden death Paternal Grandfather   . Sudden death Maternal Grandfather   . Heart attack Paternal Aunt   . Other Maternal Aunt  Natural Causes  . Healthy Son   . Anxiety disorder Daughter     Allergies  Allergen Reactions  . Bee Venom     Lip swelling    Medication list has been reviewed and updated.  Current Outpatient Medications on File Prior to Visit  Medication Sig Dispense Refill  . aspirin 81 MG tablet Take 81 mg by mouth daily.    Marland Kitchen atorvastatin (LIPITOR) 10 MG tablet Take 1 tablet (10 mg total) by mouth daily at 6 PM. 90 tablet 1  . gabapentin (NEURONTIN) 300 MG capsule Take 1 capsule (300 mg total) by mouth at bedtime. 90 capsule 0  . lisinopril (ZESTRIL) 10 MG tablet TAKE ONE TABLET BY MOUTH ONE TIME DAILY 90 tablet 0  . metFORMIN (GLUCOPHAGE) 500 MG tablet Take 1 tablet (500 mg total) by mouth 2 (two) times daily with a meal. 180 tablet 3  . Omega-3 Fatty Acids (FISH OIL) 1200 MG CAPS Take by mouth daily.     No current facility-administered medications on file prior to visit.      Review of Systems:  As per HPI- otherwise negative. No fever or chills   Physical Examination: Vitals:   09/29/18 0818  BP: 136/82  Pulse: 82  Resp: 16  Temp: 98.3 F (36.8 C)  SpO2: 96%   Vitals:   09/29/18 0818  Weight: 212 lb (96.2 kg)  Height: 6\' 2"  (1.88 m)   Body mass index is 27.22 kg/m. Ideal Body Weight: Weight in (lb) to have BMI = 25: 194.3  GEN: WDWN, NAD, Non-toxic, A & O x 3, looks well, normal weight HEENT: Atraumatic, Normocephalic. Neck supple. No masses, No LAD.  TM wnl, PEERL Ears and Nose: No external deformity. CV: RRR, No M/G/R. No JVD. No thrill. No extra heart sounds. PULM: CTA B, no wheezes, crackles, rhonchi. No retractions. No resp. distress. No accessory muscle use. ABD: S, NT, ND. No rebound. No HSM. EXTR: No c/c/e NEURO Normal gait.  PSYCH: Normally interactive. Conversant. Not depressed or anxious appearing.  Calm demeanor.  Foot exam done today  Multiple sebs on his back- he has seen derm  Assessment and Plan:   ICD-10-CM   1. Physical exam  Z00.00   2. Hyperlipidemia, unspecified hyperlipidemia type  E78.5 Lipid panel    atorvastatin (LIPITOR) 10 MG tablet  3. Essential hypertension, benign  I10 CBC    Comprehensive metabolic panel    lisinopril (ZESTRIL) 10 MG tablet  4. Controlled type 2 diabetes mellitus without complication, without long-term current use of insulin ([facility])  E11.9 Comprehensive metabolic panel    Hemoglobin A1c    Microalbumin / creatinine urine ratio  5. Eyelid twitch  R25.3   6. Screening for prostate cancer  Z12.5 PSA  7. Eye twitch  G24.5 gabapentin (NEURONTIN) 300 MG capsule   CPE today Labs pending as above Health maint is UTD Will plan further follow- up pending labs. Plan visit in 6 months assuming all is well   Follow-up: No follow-ups on file.  Meds ordered this encounter  Medications  . atorvastatin (LIPITOR) 10 MG tablet    Sig: Take 1 tablet (10 mg total) by mouth daily at 6 PM.     Dispense:  90 tablet    Refill:  3  . lisinopril (ZESTRIL) 10 MG tablet    Sig: Take 1 tablet (10 mg total) by mouth daily.    Dispense:  90 tablet    Refill:  3  . gabapentin (NEURONTIN) 300  MG capsule    Sig: Take 1 capsule (300 mg total) by mouth at bedtime.    Dispense:  90 capsule    Refill:  3   Orders Placed This Encounter  Procedures  . CBC  . Comprehensive metabolic panel  . Hemoglobin A1c  . Lipid panel  . PSA  . Microalbumin / creatinine urine ratio   Signed Lamar Blinks, MD  Received his labs, message to pt  Results for orders placed or performed in visit on 09/29/18  CBC  Result Value Ref Range   WBC 7.7 4.0 - 10.5 K/uL   RBC 4.96 4.22 - 5.81 Mil/uL   Platelets 210.0 150.0 - 400.0 K/uL   Hemoglobin 15.2 13.0 - 17.0 g/dL   HCT 45.0 39.0 - 52.0 %   MCV 90.7 78.0 - 100.0 fl   MCHC 33.7 30.0 - 36.0 g/dL   RDW 13.6 11.5 - 15.5 %  Comprehensive metabolic panel  Result Value Ref Range   Sodium 140 135 - 145 mEq/L   Potassium 4.8 3.5 - 5.1 mEq/L   Chloride 105 96 - 112 mEq/L   CO2 23 19 - 32 mEq/L   Glucose, Bld 145 (H) 70 - 99 mg/dL   BUN 23 6 - 23 mg/dL   Creatinine, Ser 1.23 0.40 - 1.50 mg/dL   Total Bilirubin 1.0 0.2 - 1.2 mg/dL   Alkaline Phosphatase 88 39 - 117 U/L   AST 20 0 - 37 U/L   ALT 28 0 - 53 U/L   Total Protein 7.6 6.0 - 8.3 g/dL   Albumin 4.8 3.5 - 5.2 g/dL   Calcium 10.2 8.4 - 10.5 mg/dL   GFR 58.63 (L) >60.00 mL/min  Hemoglobin A1c  Result Value Ref Range   Hgb A1c MFr Bld 7.7 (H) 4.6 - 6.5 %  Lipid panel  Result Value Ref Range   Cholesterol 120 0 - 200 mg/dL   Triglycerides 183.0 (H) 0.0 - 149.0 mg/dL   HDL 33.60 (L) >39.00 mg/dL   VLDL 36.6 0.0 - 40.0 mg/dL   LDL Cholesterol 50 0 - 99 mg/dL   Total CHOL/HDL Ratio 4    NonHDL 86.86   PSA  Result Value Ref Range   PSA 1.73 0.10 - 4.00 ng/mL  Microalbumin / creatinine urine ratio  Result Value Ref Range   Microalb, Ur 5.4 (H) 0.0 - 1.9 mg/dL   Creatinine,U 198.2 mg/dL    Microalb Creat Ratio 2.7 0.0 - 30.0 mg/g

## 2018-09-29 ENCOUNTER — Encounter: Payer: Self-pay | Admitting: Family Medicine

## 2018-09-29 ENCOUNTER — Ambulatory Visit (INDEPENDENT_AMBULATORY_CARE_PROVIDER_SITE_OTHER): Payer: Medicare HMO | Admitting: Family Medicine

## 2018-09-29 ENCOUNTER — Other Ambulatory Visit: Payer: Self-pay

## 2018-09-29 VITALS — BP 136/82 | HR 82 | Temp 98.3°F | Resp 16 | Ht 74.0 in | Wt 212.0 lb

## 2018-09-29 DIAGNOSIS — Z125 Encounter for screening for malignant neoplasm of prostate: Secondary | ICD-10-CM

## 2018-09-29 DIAGNOSIS — E119 Type 2 diabetes mellitus without complications: Secondary | ICD-10-CM | POA: Diagnosis not present

## 2018-09-29 DIAGNOSIS — G245 Blepharospasm: Secondary | ICD-10-CM | POA: Diagnosis not present

## 2018-09-29 DIAGNOSIS — I10 Essential (primary) hypertension: Secondary | ICD-10-CM | POA: Diagnosis not present

## 2018-09-29 DIAGNOSIS — E785 Hyperlipidemia, unspecified: Secondary | ICD-10-CM

## 2018-09-29 DIAGNOSIS — Z0000 Encounter for general adult medical examination without abnormal findings: Secondary | ICD-10-CM

## 2018-09-29 DIAGNOSIS — R253 Fasciculation: Secondary | ICD-10-CM

## 2018-09-29 LAB — CBC
HCT: 45 % (ref 39.0–52.0)
Hemoglobin: 15.2 g/dL (ref 13.0–17.0)
MCHC: 33.7 g/dL (ref 30.0–36.0)
MCV: 90.7 fl (ref 78.0–100.0)
Platelets: 210 10*3/uL (ref 150.0–400.0)
RBC: 4.96 Mil/uL (ref 4.22–5.81)
RDW: 13.6 % (ref 11.5–15.5)
WBC: 7.7 10*3/uL (ref 4.0–10.5)

## 2018-09-29 LAB — COMPREHENSIVE METABOLIC PANEL
ALT: 28 U/L (ref 0–53)
AST: 20 U/L (ref 0–37)
Albumin: 4.8 g/dL (ref 3.5–5.2)
Alkaline Phosphatase: 88 U/L (ref 39–117)
BUN: 23 mg/dL (ref 6–23)
CO2: 23 mEq/L (ref 19–32)
Calcium: 10.2 mg/dL (ref 8.4–10.5)
Chloride: 105 mEq/L (ref 96–112)
Creatinine, Ser: 1.23 mg/dL (ref 0.40–1.50)
GFR: 58.63 mL/min — ABNORMAL LOW (ref >60.00)
Glucose, Bld: 145 mg/dL — ABNORMAL HIGH (ref 70–99)
Potassium: 4.8 mEq/L (ref 3.5–5.1)
Sodium: 140 mEq/L (ref 135–145)
Total Bilirubin: 1 mg/dL (ref 0.2–1.2)
Total Protein: 7.6 g/dL (ref 6.0–8.3)

## 2018-09-29 LAB — LIPID PANEL
Cholesterol: 120 mg/dL (ref 0–200)
HDL: 33.6 mg/dL — ABNORMAL LOW (ref >39.00)
LDL Cholesterol: 50 mg/dL (ref 0–99)
NonHDL: 86.86
Total CHOL/HDL Ratio: 4
Triglycerides: 183 mg/dL — ABNORMAL HIGH (ref 0.0–149.0)
VLDL: 36.6 mg/dL (ref 0.0–40.0)

## 2018-09-29 LAB — HEMOGLOBIN A1C: Hgb A1c MFr Bld: 7.7 % — ABNORMAL HIGH (ref 4.6–6.5)

## 2018-09-29 LAB — MICROALBUMIN / CREATININE URINE RATIO
Creatinine,U: 198.2 mg/dL
Microalb Creat Ratio: 2.7 mg/g (ref 0.0–30.0)
Microalb, Ur: 5.4 mg/dL — ABNORMAL HIGH (ref 0.0–1.9)

## 2018-09-29 LAB — PSA: PSA: 1.73 ng/mL (ref 0.10–4.00)

## 2018-09-29 MED ORDER — ATORVASTATIN CALCIUM 10 MG PO TABS: 10.0000 mg | ORAL_TABLET | Freq: Every day | ORAL | 3 refills | Status: DC

## 2018-09-29 MED ORDER — LISINOPRIL 10 MG PO TABS: 10.0000 mg | ORAL_TABLET | Freq: Every day | ORAL | 3 refills | Status: DC

## 2018-09-29 MED ORDER — GABAPENTIN 300 MG PO CAPS: 300.0000 mg | ORAL_CAPSULE | Freq: Every day | ORAL | 3 refills | Status: DC

## 2018-09-29 NOTE — Patient Instructions (Signed)
Great to see you again today!  Take care and I will be in touch with your results asap As we discussed, I think playing outdoor sports is relatively low risk and ok if you want to get back to pickleball  Assuming labs are ok let's visit in 6 months I would encourage you to switch to diet soda or no soda at all.    Health Maintenance After Age 67 After age 86, you are at a higher risk for certain long-term diseases and infections as well as injuries from falls. Falls are a major cause of broken bones and head injuries in people who are older than age 62. Getting regular preventive care can help to keep you healthy and well. Preventive care includes getting regular testing and making lifestyle changes as recommended by your health care provider. Talk with your health care provider about:  Which screenings and tests you should have. A screening is a test that checks for a disease when you have no symptoms.  A diet and exercise plan that is right for you. What should I know about screenings and tests to prevent falls? Screening and testing are the best ways to find a health problem early. Early diagnosis and treatment give you the best chance of managing medical conditions that are common after age 67. Certain conditions and lifestyle choices may make you more likely to have a fall. Your health care provider may recommend:  Regular vision checks. Poor vision and conditions such as cataracts can make you more likely to have a fall. If you wear glasses, make sure to get your prescription updated if your vision changes.  Medicine review. Work with your health care provider to regularly review all of the medicines you are taking, including over-the-counter medicines. Ask your health care provider about any side effects that may make you more likely to have a fall. Tell your health care provider if any medicines that you take make you feel dizzy or sleepy.  Osteoporosis screening. Osteoporosis is a  condition that causes the bones to get weaker. This can make the bones weak and cause them to break more easily.  Blood pressure screening. Blood pressure changes and medicines to control blood pressure can make you feel dizzy.  Strength and balance checks. Your health care provider may recommend certain tests to check your strength and balance while standing, walking, or changing positions.  Foot health exam. Foot pain and numbness, as well as not wearing proper footwear, can make you more likely to have a fall.  Depression screening. You may be more likely to have a fall if you have a fear of falling, feel emotionally low, or feel unable to do activities that you used to do.  Alcohol use screening. Using too much alcohol can affect your balance and may make you more likely to have a fall. What actions can I take to lower my risk of falls? General instructions  Talk with your health care provider about your risks for falling. Tell your health care provider if: ? You fall. Be sure to tell your health care provider about all falls, even ones that seem minor. ? You feel dizzy, sleepy, or off-balance.  Take over-the-counter and prescription medicines only as told by your health care provider. These include any supplements.  Eat a healthy diet and maintain a healthy weight. A healthy diet includes low-fat dairy products, low-fat (lean) meats, and fiber from whole grains, beans, and lots of fruits and vegetables. Home safety  Remove  any tripping hazards, such as rugs, cords, and clutter.  Install safety equipment such as grab bars in bathrooms and safety rails on stairs.  Keep rooms and walkways well-lit. Activity   Follow a regular exercise program to stay fit. This will help you maintain your balance. Ask your health care provider what types of exercise are appropriate for you.  If you need a cane or walker, use it as recommended by your health care provider.  Wear supportive shoes  that have nonskid soles. Lifestyle  Do not drink alcohol if your health care provider tells you not to drink.  If you drink alcohol, limit how much you have: ? 0-1 drink a day for women. ? 0-2 drinks a day for men.  Be aware of how much alcohol is in your drink. In the [place] one drink equals one typical bottle of beer (12 oz), one-half glass of wine (5 oz), or one shot of hard liquor (1 oz).  Do not use any products that contain nicotine or tobacco, such as cigarettes and e-cigarettes. If you need help quitting, ask your health care provider. Summary  Having a healthy lifestyle and getting preventive care can help to protect your health and wellness after age 14.  Screening and testing are the best way to find a health problem early and help you avoid having a fall. Early diagnosis and treatment give you the best chance for managing medical conditions that are more common for people who are older than age 15.  Falls are a major cause of broken bones and head injuries in people who are older than age 90. Take precautions to prevent a fall at home.  Work with your health care provider to learn what changes you can make to improve your health and wellness and to prevent falls. This information is not intended to replace advice given to you by your health care provider. Make sure you discuss any questions you have with your health care provider. Document Released: 01/30/2017 Document Revised: 07/10/2018 Document Reviewed: 01/30/2017 Elsevier Patient Education  2020 [employer].

## 2018-10-01 MED ORDER — METFORMIN HCL 500 MG PO TABS: 1000.0000 mg | ORAL_TABLET | Freq: Two times a day (BID) | ORAL | 3 refills | Status: DC

## 2018-11-11 ENCOUNTER — Encounter: Payer: Self-pay | Admitting: Family Medicine

## 2018-11-24 ENCOUNTER — Encounter (HOSPITAL_BASED_OUTPATIENT_CLINIC_OR_DEPARTMENT_OTHER): Payer: Self-pay | Admitting: *Deleted

## 2018-11-24 ENCOUNTER — Ambulatory Visit: Payer: Self-pay

## 2018-11-24 ENCOUNTER — Other Ambulatory Visit: Payer: Self-pay

## 2018-11-24 ENCOUNTER — Emergency Department (HOSPITAL_BASED_OUTPATIENT_CLINIC_OR_DEPARTMENT_OTHER): Payer: Medicare HMO

## 2018-11-24 ENCOUNTER — Emergency Department (HOSPITAL_BASED_OUTPATIENT_CLINIC_OR_DEPARTMENT_OTHER)
Admission: EM | Admit: 2018-11-24 | Discharge: 2018-11-24 | Disposition: A | Discharge status: Home / Self Care | Payer: Medicare HMO | Attending: Emergency Medicine | Admitting: Emergency Medicine

## 2018-11-24 DIAGNOSIS — Z7982 Long term (current) use of aspirin: Secondary | ICD-10-CM | POA: Diagnosis not present

## 2018-11-24 DIAGNOSIS — N200 Calculus of kidney: Secondary | ICD-10-CM

## 2018-11-24 DIAGNOSIS — I10 Essential (primary) hypertension: Secondary | ICD-10-CM | POA: Insufficient documentation

## 2018-11-24 DIAGNOSIS — Z79899 Other long term (current) drug therapy: Secondary | ICD-10-CM | POA: Insufficient documentation

## 2018-11-24 DIAGNOSIS — R109 Unspecified abdominal pain: Secondary | ICD-10-CM | POA: Diagnosis present

## 2018-11-24 DIAGNOSIS — N132 Hydronephrosis with renal and ureteral calculous obstruction: Secondary | ICD-10-CM | POA: Diagnosis not present

## 2018-11-24 DIAGNOSIS — E119 Type 2 diabetes mellitus without complications: Secondary | ICD-10-CM | POA: Diagnosis not present

## 2018-11-24 DIAGNOSIS — Z7984 Long term (current) use of oral hypoglycemic drugs: Secondary | ICD-10-CM | POA: Insufficient documentation

## 2018-11-24 DIAGNOSIS — K5730 Diverticulosis of large intestine without perforation or abscess without bleeding: Secondary | ICD-10-CM | POA: Diagnosis not present

## 2018-11-24 LAB — CBC
HCT: 43.2 % (ref 39.0–52.0)
Hemoglobin: 14.4 g/dL (ref 13.0–17.0)
MCH: 30.3 pg (ref 26.0–34.0)
MCHC: 33.3 g/dL (ref 30.0–36.0)
MCV: 90.8 fL (ref 80.0–100.0)
Platelets: 214 10*3/uL (ref 150–400)
RBC: 4.76 MIL/uL (ref 4.22–5.81)
RDW: 13.2 % (ref 11.5–15.5)
WBC: 14.6 10*3/uL — ABNORMAL HIGH (ref 4.0–10.5)
nRBC: 0 % (ref 0.0–0.2)

## 2018-11-24 LAB — BASIC METABOLIC PANEL
Anion gap: 11 (ref 5–15)
BUN: 24 mg/dL — ABNORMAL HIGH (ref 8–23)
CO2: 23 mmol/L (ref 22–32)
Calcium: 9.7 mg/dL (ref 8.9–10.3)
Chloride: 108 mmol/L (ref 98–111)
Creatinine, Ser: 1.61 mg/dL — ABNORMAL HIGH (ref 0.61–1.24)
GFR calc Af Amer: 51 mL/min — ABNORMAL LOW (ref >60)
GFR calc non Af Amer: 44 mL/min — ABNORMAL LOW (ref >60)
Glucose, Bld: 195 mg/dL — ABNORMAL HIGH (ref 70–99)
Potassium: 4.4 mmol/L (ref 3.5–5.1)
Sodium: 142 mmol/L (ref 135–145)

## 2018-11-24 LAB — URINALYSIS, ROUTINE W REFLEX MICROSCOPIC
Glucose, UA: NEGATIVE mg/dL
Ketones, ur: 15 mg/dL — AB
Leukocytes,Ua: NEGATIVE
Nitrite: NEGATIVE
Protein, ur: 100 mg/dL — AB
Specific Gravity, Urine: >1.03 — ABNORMAL HIGH (ref 1.005–1.030)
pH: 5.5 (ref 5.0–8.0)

## 2018-11-24 LAB — URINALYSIS, MICROSCOPIC (REFLEX): RBC / HPF: >50 RBC/hpf (ref 0–5)

## 2018-11-24 MED ORDER — OXYCODONE-ACETAMINOPHEN 5-325 MG PO TABS: 1.0000 | ORAL_TABLET | ORAL | 0 refills | Status: DC | PRN

## 2018-11-24 MED ORDER — TAMSULOSIN HCL 0.4 MG PO CAPS
0.4000 mg | ORAL_CAPSULE | Freq: Once | ORAL | Status: AC
  Administered 2018-11-24: 0.4 mg via ORAL
  Filled 2018-11-24: qty 1

## 2018-11-24 MED ORDER — ONDANSETRON HCL 4 MG/2ML IJ SOLN
4.0000 mg | Freq: Once | INTRAMUSCULAR | Status: AC
  Administered 2018-11-24: 4 mg via INTRAVENOUS
  Filled 2018-11-24: qty 2

## 2018-11-24 MED ORDER — OXYCODONE-ACETAMINOPHEN 5-325 MG PO TABS
1.0000 | ORAL_TABLET | Freq: Once | ORAL | Status: AC
  Administered 2018-11-24: 1 via ORAL
  Filled 2018-11-24: qty 1

## 2018-11-24 MED ORDER — TAMSULOSIN HCL 0.4 MG PO CAPS: 0.4000 mg | ORAL_CAPSULE | Freq: Every day | ORAL | 0 refills | Status: DC

## 2018-11-24 MED ORDER — ONDANSETRON 4 MG PO TBDP: 4.0000 mg | ORAL_TABLET | ORAL | 0 refills | Status: DC | PRN

## 2018-11-24 MED ORDER — KETOROLAC TROMETHAMINE 30 MG/ML IJ SOLN
15.0000 mg | Freq: Once | INTRAMUSCULAR | Status: AC
  Administered 2018-11-24: 15 mg via INTRAVENOUS
  Filled 2018-11-24: qty 1

## 2018-11-24 MED ORDER — MORPHINE SULFATE (PF) 4 MG/ML IV SOLN
4.0000 mg | Freq: Once | INTRAVENOUS | Status: AC
  Administered 2018-11-24: 4 mg via INTRAVENOUS
  Filled 2018-11-24: qty 1

## 2018-11-24 NOTE — Telephone Encounter (Signed)
Pt. Reports he started having back, low abdomen and groin pain around 11:30. States he has had kidney stones in the past "and this is what this feels like." Also has nausea and vomiting. No openings in the practice per [facility]. Offered another location. Pt. Reports he will go to ED.  Answer Assessment - Initial Assessment Questions 1. ONSET: "When did the pain begin?"      11:30 2. LOCATION: "Where does it hurt?" (upper, mid or lower back)     Back 3. SEVERITY: "How bad is the pain?"  (e.g., Scale 1-10; mild, moderate, or severe)   - MILD (1-3): doesn't interfere with normal activities    - MODERATE (4-7): interferes with normal activities or awakens from sleep    - SEVERE (8-10): excruciating pain, unable to do any normal activities      8 4. PATTERN: "Is the pain constant?" (e.g., yes, no; constant, intermittent)       Constant5. RADIATION: "Does the pain shoot into your legs or elsewhere?"     Groin 6. CAUSE:  "What do you think is causing the back pain?"      Kidney stone 7. BACK OVERUSE:  "Any recent lifting of heavy objects, strenuous work or exercise?"     No 8. MEDICATIONS: "What have you taken so far for the pain?" (e.g., nothing, acetaminophen, NSAIDS)     No 9. NEUROLOGIC SYMPTOMS: "Do you have any weakness, numbness, or problems with bowel/bladder control?"     No 10. OTHER SYMPTOMS: "Do you have any other symptoms?" (e.g., fever, abdominal pain, burning with urination, blood in urine)       Low abd. And groin pain 11. PREGNANCY: "Is there any chance you are pregnant?" (e.g., yes, no; LMP)       No  Protocols used: BACK PAIN-A-AH

## 2018-11-24 NOTE — Discharge Instructions (Addendum)
1.  Take Flomax daily as prescribed.  Take 1 Percocet every 6 hours for the next 24 hours for pain control.  Take Zofran if needed for nausea.  If you need additional pain control, you may take up to 2 Percocet every 6 hours.  Stay hydrated with small frequent sips of fluids as tolerated. 2.  Return to the emergency department if you develop fever, pain is not controlled by medications, vomiting and inability to take medications or other concerning symptoms. 3.  Call [facility] to schedule a follow-up appointment in 3 to 7 days. 4.  You were given information on incidental findings on your CT scan of hiatal hernia, diverticulosis and atherosclerosis.  Have your doctor review your CT scan.  All of these findings are common and frequently do not cause problems but in certain cases may progress to other problems.  You have been given information regarding gastroesophageal reflux disease as you have described this in your history review.

## 2018-11-24 NOTE — ED Triage Notes (Signed)
Left lower back and left groin pain sudden onset 1100 this am   Vomited x 1

## 2018-11-24 NOTE — ED Notes (Signed)
Patient transported to CT 

## 2018-11-24 NOTE — ED Provider Notes (Signed)
[facility]PART[facility] Provider Note   CSN: [id number] Arrival date & time: 11/24/18  1527     History   Chief Complaint Chief Complaint  Patient presents with   Back Pain    HPI Micheal Oliver is a 68 y.o. male.     HPI Patient reports that this afternoon he started to develop some right flank pain.  He reports that initially it seemed kind of mild but quickly became severe.  He reports it is a sharp pain in his right flank.  He reports he was unable to get comfortable in any way.  He reports he has had kidney stones before but is been quite a while.  He denies he is ever required intervention.  He reports when the pain really escalated he did vomit one large episode.  No other symptoms.  He reports he has otherwise been well.  On review of systems, patient does report getting burning acid coming up in his throat at night sometimes.  He does not treat himself for reflux since this only happens occasionally. Past Medical History:  Diagnosis Date   Chicken pox    Kidney stones    5-6 times   Measles    Mumps     Patient Active Problem List   Diagnosis Date Noted   Eyelid twitch 09/28/2018   Controlled type 2 diabetes mellitus without complication, without long-term current use of insulin ([place]) 07/12/2016   Hyperlipidemia 01/10/2016   Family history of early CAD 07/11/2015   Essential hypertension, benign 01/29/2014   Erectile dysfunction 10/16/2013    Past Surgical History:  Procedure Laterality Date   WISDOM TOOTH EXTRACTION  1976-77        Home Medications    Prior to Admission medications   Medication Sig Start Date End Date Taking? Authorizing Provider  aspirin 81 MG tablet Take 81 mg by mouth daily.    [provider]  atorvastatin (LIPITOR) 10 MG tablet Take 1 tablet (10 mg total) by mouth daily at 6 PM. 09/29/18   Copland, Gay Filler, MD  gabapentin (NEURONTIN) 300 MG capsule Take 1 capsule (300 mg total) by mouth at  bedtime. 09/29/18   Copland, Gay Filler, MD  lisinopril (ZESTRIL) 10 MG tablet Take 1 tablet (10 mg total) by mouth daily. 09/29/18   Copland, Gay Filler, MD  metFORMIN (GLUCOPHAGE) 500 MG tablet Take 2 tablets (1,000 mg total) by mouth 2 (two) times daily with a meal. 10/01/18   Copland, Gay Filler, MD  Omega-3 Fatty Acids (FISH OIL) 1200 MG CAPS Take by mouth daily.    [provider]  ondansetron (ZOFRAN ODT) 4 MG disintegrating tablet Take 1 tablet (4 mg total) by mouth every 4 (four) hours as needed for nausea or vomiting. 11/24/18   Charlesetta Shanks, MD  oxyCODONE-acetaminophen (PERCOCET) 5-325 MG tablet Take 1-2 tablets by mouth every 4 (four) hours as needed. 11/24/18   Charlesetta Shanks, MD  tamsulosin (FLOMAX) 0.4 MG CAPS capsule Take 1 capsule (0.4 mg total) by mouth daily. 11/24/18   Charlesetta Shanks, MD    Family History Family History  Problem Relation Age of Onset   Alcohol abuse Father 52       Deceased   Heart disease Father    Lung disease Father    Hypertension Father    Stomach cancer Mother 53       Deceased   Hypertension Paternal Grandmother    Dementia Paternal Grandmother    Stroke Paternal Grandmother  Arthritis Maternal Grandmother    Sudden death Paternal Grandfather    Sudden death Maternal Grandfather    Heart attack Paternal Aunt    Other Maternal Aunt        Natural Causes   Healthy Son    Anxiety disorder Daughter     Social History Social History   Tobacco Use   Smoking status: Never Smoker   Smokeless tobacco: Never Used  Substance Use Topics   Alcohol use: No   Drug use: No     Allergies   Bee venom   Review of Systems Review of Systems 10 Systems reviewed and are negative for acute change except as noted in the HPI.   Physical Exam Updated Vital Signs BP (!) 160/100 (BP Location: Right Arm)    Pulse 98    Resp 16    Ht 6\' 2"  (1.88 m)    Wt 96.6 kg    SpO2 100%    BMI 27.35 kg/m   Physical  Exam Constitutional:      Appearance: He is well-developed.  HENT:     Head: Normocephalic and atraumatic.  Neck:     Musculoskeletal: Neck supple.  Cardiovascular:     Rate and Rhythm: Normal rate and regular rhythm.     Heart sounds: Normal heart sounds.  Pulmonary:     Effort: Pulmonary effort is normal.     Breath sounds: Normal breath sounds.  Abdominal:     General: Bowel sounds are normal. There is no distension.     Palpations: Abdomen is soft.     Tenderness: There is no abdominal tenderness.  Musculoskeletal: Normal range of motion.  Skin:    General: Skin is warm and dry.  Neurological:     Mental Status: He is alert and oriented to person, place, and time.     GCS: GCS eye subscore is 4. GCS verbal subscore is 5. GCS motor subscore is 6.     Coordination: Coordination normal.      ED Treatments / Results  Labs (all labs ordered are listed, but only abnormal results are displayed) Labs Reviewed  URINALYSIS, ROUTINE W REFLEX MICROSCOPIC - Abnormal; Notable for the following components:      Result Value   Color, Urine BROWN (*)    APPearance TURBID (*)    Specific Gravity, Urine >1.030 (*)    Hgb urine dipstick LARGE (*)    Bilirubin Urine SMALL (*)    Ketones, ur 15 (*)    Protein, ur 100 (*)    All other components within normal limits  URINALYSIS, MICROSCOPIC (REFLEX) - Abnormal; Notable for the following components:   Bacteria, UA RARE (*)    All other components within normal limits  BASIC METABOLIC PANEL - Abnormal; Notable for the following components:   Glucose, Bld 195 (*)    BUN 24 (*)    Creatinine, Ser 1.61 (*)    GFR calc non Af Amer 44 (*)    GFR calc Af Amer 51 (*)    All other components within normal limits  CBC - Abnormal; Notable for the following components:   WBC 14.6 (*)    All other components within normal limits    EKG None  Radiology Ct Renal Stone Study  Result Date: 11/24/2018 CLINICAL DATA:  Left low back and left  groin pain, sudden onset 11 a.m. this morning. History of nephrolithiasis. EXAM: CT ABDOMEN AND PELVIS WITHOUT CONTRAST TECHNIQUE: Multidetector CT imaging of the abdomen and pelvis  was performed following the standard protocol without IV contrast. COMPARISON:  01/29/2014 abdominal radiograph. FINDINGS: Lower chest: Centrally coarsely calcified subcentimeter left lower lobe granuloma. No acute abnormality at the lung bases. Hepatobiliary: Normal liver size. No liver mass. Normal gallbladder with no radiopaque cholelithiasis. No biliary ductal dilatation. Multiple moderate periampullary duodenal diverticula. Pancreas: Normal, with no mass or duct dilation. Spleen: Normal size. No mass. Adrenals/Urinary Tract: Normal adrenals. Obstructing 2 mm left ureterovesical junction stone with mild left hydroureteronephrosis and asymmetric left perinephric fat stranding and ill-defined fluid. Punctate nonobstructing upper left renal stone. Nonobstructing 2 mm upper right renal stone. No right hydronephrosis. Normal caliber right ureter. No additional ureteral stones. No contour deforming renal masses. Normal bladder. Stomach/Bowel: Small hiatal hernia. Otherwise normal stomach filled with food debris. Normal caliber small bowel with no small bowel wall thickening. Normal appendix. Marked colonic diverticulosis, most prominent in the sigmoid colon, with no definite large bowel wall thickening or significant pericolonic fat stranding. Vascular/Lymphatic: Minimally atherosclerotic nonaneurysmal abdominal aorta. No pathologically enlarged lymph nodes in the abdomen or pelvis. Reproductive: Mildly enlarged prostate. Other: No pneumoperitoneum, ascites or focal fluid collection. Musculoskeletal: No aggressive appearing focal osseous lesions. Marked lumbar spondylosis. IMPRESSION: 1. Obstructing 2 mm left UVJ stone with mild left hydroureteronephrosis. 2. Additional tiny nonobstructing stones in the upper kidneys bilaterally. 3. Mild  prostatomegaly. 4. Small hiatal hernia. 5. Marked colonic diverticulosis. 6.  Aortic Atherosclerosis (ICD10-I70.0). Electronically Signed   By: Ilona Sorrel M.D.   On: 11/24/2018 17:05    Procedures Procedures (including critical care time)  Medications Ordered in ED Medications  ketorolac (TORADOL) 30 MG/ML injection 15 mg (has no administration in time range)  tamsulosin (FLOMAX) capsule 0.4 mg (has no administration in time range)  oxyCODONE-acetaminophen (PERCOCET/ROXICET) 5-325 MG per tablet 1 tablet (has no administration in time range)  morphine 4 MG/ML injection 4 mg (4 mg Intravenous Given 11/24/18 1640)  ondansetron (ZOFRAN) injection 4 mg (4 mg Intravenous Given 11/24/18 1640)     Initial Impression / Assessment and Plan / ED Course  I have reviewed the triage vital signs and the nursing notes.  Pertinent labs & imaging results that were available during my care of the patient were reviewed by me and considered in my medical decision making (see chart for details).       Patient completely pain control 4 mg of morphine.  He is clinically well.  CT does confirm 2 mm stone consistent with history.  Patient was educated on incidental findings of hiatal hernia, diverticulosis and atherosclerosis.  At this time he is stable for discharge.  He is counseled on taking Flomax daily, Percocet 1 tablet every 6 hours for the next 24 hours then as needed.  He is counseled on contacting urology for follow-up.  Return precautions reviewed.  Final Clinical Impressions(s) / ED Diagnoses   Final diagnoses:  Kidney stone    ED Discharge Orders         Ordered    tamsulosin (FLOMAX) 0.4 MG CAPS capsule  Daily     11/24/18 1742    oxyCODONE-acetaminophen (PERCOCET) 5-325 MG tablet  Every 4 hours PRN     11/24/18 1742    ondansetron (ZOFRAN ODT) 4 MG disintegrating tablet  Every 4 hours PRN     11/24/18 1742           Charlesetta Shanks, MD 11/24/18 1749

## 2018-12-05 DIAGNOSIS — R69 Illness, unspecified: Secondary | ICD-10-CM | POA: Diagnosis not present

## 2019-03-19 NOTE — Patient Instructions (Addendum)
It was good to see you again today, I will be in touch with your labs ASAP  Please increase lisinopril to 10 mg daily Please try an OTC PPI such as Prilosec for about 2 weeks for your post- eating cough, let me know how this does for you  Let's plan to visit in 4-6 months Please get your eye exam when you can

## 2019-03-19 NOTE — Progress Notes (Addendum)
[facility] at [facility] [address] [place], [place] [address] [phone]  Date:  03/23/2019   Name:  Micheal Oliver   DOB:  1951/03/13   MRN:  [id number]  PCP:  Darreld Mclean, MD    Chief Complaint: Diabetes and Nausea (when drinking or eating starts coughing worse when standing up)   History of Present Illness:  Micheal Oliver is a 68 y.o. very pleasant male patient who presents with the following:  Here today for a 79-month follow-up visit- virtual due to pandemic Patient location is office, provider is at home due to quarantine.  Patient identity is confirmed with 2 factors, he gives consent for virtual visit today.  The patient and myself are on the call today History of diabetes, hyperlipidemia, hypertension, family history of CAD Last seen by myself in June for a physical  Last year he was bothered by persistent eyelid twitch, he has seen neurology and try gabapentin He enjoys seeing his 3 granddaughters, and playing pickle ball- he has not been able to play pickle ball recently due to quarantine He lives next door to his grand-kids so they are able to see each other on a regular basis  Eye exam- he is due for update, will take care of this Flu shot- done in September, pulbix  A1c is due Too early for Shingrix-Zostavax 2016  He was seen in the ER in August with a kidney stone, at that time he had elevated white blood cell count and creatinine- this is resolved now He has gotten stones since the mid 90s but non in a while   Lipitor Lisinopril- he is taking 5 mg Metformin OTC omega-3 Aspirin Gabapentin? Flomax  He has noted some cough after he eats or drinks for about the last month Temperature, substance of food or drink does not matter He did have this in the past- years ago- but it went away He notes some reflux when he is sleeping at night- perhaps once a week No nausea or vomiting Colonoscopy done  earlier this year  He noted his eyes watering about 2 weeks ago, no sneezing-thought it was allergies, has resolved  He is no longer able to check his BP at [facility] but his wife does have a cuff he could use He does not check his glucose at home  Lab Results  Component Value Date   HGBA1C 7.7 (H) 09/29/2018    Patient Active Problem List   Diagnosis Date Noted  . Eyelid twitch 09/28/2018  . Controlled type 2 diabetes mellitus without complication, without long-term current use of insulin ([place]) 07/12/2016  . Hyperlipidemia 01/10/2016  . Family history of early CAD 07/11/2015  . Essential hypertension, benign 01/29/2014  . Erectile dysfunction 10/16/2013    Past Medical History:  Diagnosis Date  . Chicken pox   . Kidney stones    5-6 times  . Measles   . Mumps     Past Surgical History:  Procedure Laterality Date  . WISDOM TOOTH EXTRACTION  1976-77    Social History   Tobacco Use  . Smoking status: Never Smoker  . Smokeless tobacco: Never Used  Substance Use Topics  . Alcohol use: No  . Drug use: No    Family History  Problem Relation Age of Onset  . Alcohol abuse Father 84       Deceased  . Heart disease Father   . Lung disease Father   . Hypertension  Father   . Stomach cancer Mother 34       Deceased  . Hypertension Paternal Grandmother   . Dementia Paternal Grandmother   . Stroke Paternal Grandmother   . Arthritis Maternal Grandmother   . Sudden death Paternal Grandfather   . Sudden death Maternal Grandfather   . Heart attack Paternal Aunt   . Other Maternal Aunt        Natural Causes  . Healthy Son   . Anxiety disorder Daughter     Allergies  Allergen Reactions  . Bee Venom     Lip swelling    Medication list has been reviewed and updated.  Current Outpatient Medications on File Prior to Visit  Medication Sig Dispense Refill  . aspirin 81 MG tablet Take 81 mg by mouth daily.    Marland Kitchen atorvastatin (LIPITOR) 10 MG tablet Take 1 tablet (10 mg  total) by mouth daily at 6 PM. 90 tablet 3  . gabapentin (NEURONTIN) 300 MG capsule Take 1 capsule (300 mg total) by mouth at bedtime. 90 capsule 3  . lisinopril (ZESTRIL) 10 MG tablet Take 1 tablet (10 mg total) by mouth daily. 90 tablet 3  . metFORMIN (GLUCOPHAGE) 500 MG tablet Take 2 tablets (1,000 mg total) by mouth 2 (two) times daily with a meal. 360 tablet 3  . Omega-3 Fatty Acids (FISH OIL) 1200 MG CAPS Take by mouth daily.    Marland Kitchen oxyCODONE-acetaminophen (PERCOCET) 5-325 MG tablet Take 1-2 tablets by mouth every 4 (four) hours as needed. 20 tablet 0  . tamsulosin (FLOMAX) 0.4 MG CAPS capsule Take 1 capsule (0.4 mg total) by mouth daily. 30 capsule 0  . ondansetron (ZOFRAN ODT) 4 MG disintegrating tablet Take 1 tablet (4 mg total) by mouth every 4 (four) hours as needed for nausea or vomiting. (Patient not taking: Reported on 03/23/2019) 20 tablet 0   No current facility-administered medications on file prior to visit.    Review of Systems:  As per HPI- otherwise negative.   Physical Examination: Vitals:   03/23/19 0814  BP: (!) 166/80  Pulse: 68  Resp: 16  Temp: (!) 96.8 F (36 C)  SpO2: 97%   Vitals:   03/23/19 0814  Weight: 214 lb (97.1 kg)  Height: 6\' 2"  (1.88 m)   Body mass index is 27.48 kg/m. Ideal Body Weight: Weight in (lb) to have BMI = 25: 194.3  Patient observed over video.  He looks well, no cough, wheezing, distress is noted.  BP Readings from Last 3 Encounters:  03/23/19 (!) 166/80  11/24/18 (!) 155/99  09/29/18 136/82    Assessment and Plan: Hyperlipidemia, unspecified hyperlipidemia type - Plan: Lipid panel  Essential hypertension, benign - Plan: CBC, Comprehensive metabolic panel  Controlled type 2 diabetes mellitus without complication, without long-term current use of insulin ([place]) - Plan: Comprehensive metabolic panel, Hemoglobin A1c  Cough  Here today for a virtual recheck visit His blood pressure is running a bit high, he is only  taking 5 mg of lisinopril a day.  We will have him increase to 10 mg Encouraged eye exam when feasible He has noted cough after eating for about the last month.  I suspect this is due to reflux.  Encouraged a trial of an over-the-counter PPI or H2 blocker for about 2 weeks, he will let me know if not helpful  Will plan further follow- up pending labs.  This visit occurred during the SARS-CoV-2 public health emergency.  Safety protocols were in place, including  screening questions prior to the visit, additional usage of staff PPE, and extensive cleaning of exam room while observing appropriate contact time as indicated for disinfecting solutions.    Signed Lamar Blinks, MD  Received his labs 12/22, message to patient A1c is improved since summer Results for orders placed or performed in visit on 03/23/19  CBC  Result Value Ref Range   WBC 6.2 4.0 - 10.5 K/uL   RBC 4.51 4.22 - 5.81 Mil/uL   Platelets 187.0 150.0 - 400.0 K/uL   Hemoglobin 13.8 13.0 - 17.0 g/dL   HCT 41.3 39.0 - 52.0 %   MCV 91.5 78.0 - 100.0 fl   MCHC 33.3 30.0 - 36.0 g/dL   RDW 13.4 11.5 - 15.5 %  Comprehensive metabolic panel  Result Value Ref Range   Sodium 140 135 - 145 mEq/L   Potassium 4.2 3.5 - 5.1 mEq/L   Chloride 107 96 - 112 mEq/L   CO2 26 19 - 32 mEq/L   Glucose, Bld 132 (H) 70 - 99 mg/dL   BUN 22 6 - 23 mg/dL   Creatinine, Ser 1.21 0.40 - 1.50 mg/dL   Total Bilirubin 0.9 0.2 - 1.2 mg/dL   Alkaline Phosphatase 71 39 - 117 U/L   AST 20 0 - 37 U/L   ALT 24 0 - 53 U/L   Total Protein 6.9 6.0 - 8.3 g/dL   Albumin 4.3 3.5 - 5.2 g/dL   GFR 59.67 (L) >60.00 mL/min   Calcium 9.6 8.4 - 10.5 mg/dL  Hemoglobin A1c  Result Value Ref Range   Hgb A1c MFr Bld 7.3 (H) 4.6 - 6.5 %  Lipid panel  Result Value Ref Range   Cholesterol 109 0 - 200 mg/dL   Triglycerides 153.0 (H) 0.0 - 149.0 mg/dL   HDL 28.50 (L) >39.00 mg/dL   VLDL 30.6 0.0 - 40.0 mg/dL   LDL Cholesterol 50 0 - 99 mg/dL   Total CHOL/HDL Ratio  4    NonHDL 80.97

## 2019-03-22 ENCOUNTER — Encounter: Payer: Self-pay | Admitting: Family Medicine

## 2019-03-23 ENCOUNTER — Other Ambulatory Visit: Payer: Self-pay

## 2019-03-23 ENCOUNTER — Ambulatory Visit (INDEPENDENT_AMBULATORY_CARE_PROVIDER_SITE_OTHER): Payer: Medicare HMO | Admitting: Family Medicine

## 2019-03-23 ENCOUNTER — Encounter: Payer: Self-pay | Admitting: Family Medicine

## 2019-03-23 VITALS — BP 166/80 | HR 68 | Temp 96.8°F | Resp 16 | Ht 74.0 in | Wt 214.0 lb

## 2019-03-23 DIAGNOSIS — E785 Hyperlipidemia, unspecified: Secondary | ICD-10-CM

## 2019-03-23 DIAGNOSIS — E119 Type 2 diabetes mellitus without complications: Secondary | ICD-10-CM

## 2019-03-23 DIAGNOSIS — I10 Essential (primary) hypertension: Secondary | ICD-10-CM

## 2019-03-23 DIAGNOSIS — R059 Cough, unspecified: Secondary | ICD-10-CM

## 2019-03-23 DIAGNOSIS — R05 Cough: Secondary | ICD-10-CM | POA: Diagnosis not present

## 2019-03-23 LAB — COMPREHENSIVE METABOLIC PANEL
ALT: 24 U/L (ref 0–53)
AST: 20 U/L (ref 0–37)
Albumin: 4.3 g/dL (ref 3.5–5.2)
Alkaline Phosphatase: 71 U/L (ref 39–117)
BUN: 22 mg/dL (ref 6–23)
CO2: 26 mEq/L (ref 19–32)
Calcium: 9.6 mg/dL (ref 8.4–10.5)
Chloride: 107 mEq/L (ref 96–112)
Creatinine, Ser: 1.21 mg/dL (ref 0.40–1.50)
GFR: 59.67 mL/min — ABNORMAL LOW (ref >60.00)
Glucose, Bld: 132 mg/dL — ABNORMAL HIGH (ref 70–99)
Potassium: 4.2 mEq/L (ref 3.5–5.1)
Sodium: 140 mEq/L (ref 135–145)
Total Bilirubin: 0.9 mg/dL (ref 0.2–1.2)
Total Protein: 6.9 g/dL (ref 6.0–8.3)

## 2019-03-23 LAB — LIPID PANEL
Cholesterol: 109 mg/dL (ref 0–200)
HDL: 28.5 mg/dL — ABNORMAL LOW (ref >39.00)
LDL Cholesterol: 50 mg/dL (ref 0–99)
NonHDL: 80.97
Total CHOL/HDL Ratio: 4
Triglycerides: 153 mg/dL — ABNORMAL HIGH (ref 0.0–149.0)
VLDL: 30.6 mg/dL (ref 0.0–40.0)

## 2019-03-23 LAB — CBC
HCT: 41.3 % (ref 39.0–52.0)
Hemoglobin: 13.8 g/dL (ref 13.0–17.0)
MCHC: 33.3 g/dL (ref 30.0–36.0)
MCV: 91.5 fl (ref 78.0–100.0)
Platelets: 187 10*3/uL (ref 150.0–400.0)
RBC: 4.51 Mil/uL (ref 4.22–5.81)
RDW: 13.4 % (ref 11.5–15.5)
WBC: 6.2 10*3/uL (ref 4.0–10.5)

## 2019-03-23 LAB — HEMOGLOBIN A1C: Hgb A1c MFr Bld: 7.3 % — ABNORMAL HIGH (ref 4.6–6.5)

## 2019-03-24 ENCOUNTER — Encounter: Payer: Self-pay | Admitting: Family Medicine

## 2019-03-24 DIAGNOSIS — R69 Illness, unspecified: Secondary | ICD-10-CM | POA: Diagnosis not present

## 2019-03-30 ENCOUNTER — Ambulatory Visit: Payer: Medicare HMO | Admitting: Family Medicine

## 2019-04-06 ENCOUNTER — Encounter: Payer: Self-pay | Admitting: Family Medicine

## 2019-06-30 ENCOUNTER — Telehealth: Payer: Self-pay | Admitting: Family Medicine

## 2019-06-30 NOTE — Chronic Care Management (AMB) (Signed)
  Chronic Care Management   Note  06/30/2019 Name: Micheal Oliver MRN: [id number] DOB: Nov 13, 1950  Micheal Oliver is a 69 y.o. year old male who is a primary care patient of Copland, Gay Filler, MD. I reached out to Sheffield Slider by phone today in response to a referral sent by Micheal Oliver's PCP, Copland, Gay Filler, MD.   Micheal Oliver was given information about Chronic Care Management services today including:  1. CCM service includes personalized support from designated clinical staff supervised by his physician, including individualized plan of care and coordination with other care providers 2. 24/7 contact phone numbers for assistance for urgent and routine care needs. 3. Service will only be billed when office clinical staff spend 20 minutes or more in a month to coordinate care. 4. Only one practitioner may furnish and bill the service in a calendar month. 5. The patient may stop CCM services at any time (effective at the end of the month) by phone call to the office staff.   Patient agreed to services and verbal consent obtained.   Follow up plan:   Raynicia Dukes [employer] Scheduler

## 2019-07-21 ENCOUNTER — Telehealth: Payer: Self-pay | Admitting: Family Medicine

## 2019-07-21 NOTE — Progress Notes (Signed)
Patient was contacted to reschedule CCM appt on April 22nd due to an important meeting that came up for the CPP. Patient stated he did not wish to reschedule the CCM appt.   Raynicia Dukes [employer] Scheduler

## 2019-07-23 ENCOUNTER — Telehealth: Payer: Medicare HMO

## 2019-09-17 NOTE — Patient Instructions (Addendum)
I will get you set up to see neurology regarding your recent stroke and pulmonology regarding your cough and lung nodules As we discussed, you are now taking aspirin AND plavix to help prevent another stroke.  We will also need to control your BP, diabetes and cholesterol in help prevent another stroke  The radiologist was concerned that you could have lung cancer on your recent CT scan.  We will get you urgently referred to pulmonology to help [place] work this up. I am sorry that this is happening to you  Please let me know what I can do to help.  Let's visit in 3 months   Please get your eye exam at your convenience

## 2019-09-17 NOTE — Progress Notes (Signed)
[place] at [facility], [place] [address], [place], [place] [address] [phone]  Date:  09/21/2019   Name:  Micheal Oliver   DOB:  1950-05-07   MRN:  [id number]  PCP:  Darreld Mclean, MD    Chief Complaint: Hyperlipidemia (Pt states he had Stroke Friday afternoon. Discharged yesterday afternoon. ) and Diabetes   History of Present Illness:  Dariel Oliver is a 69 y.o. very pleasant male patient who presents with the following:  Patient here today for 19-monthfollow-up/ hospital follow-up History of hypertension, diabetes, hyperlipidemia, family history of early CAD, ED Today is Monday- this past Friday am he was doing his normal activities.  Later that morning he noted that his memory seemed to be acutely worse than normal, so he went to the ER and was eventually admitted with stroke.  He was released to home yesterday. Unfortunately his work-up alos revealed likely lung cancer  Admit date: 09/18/2019 Discharge date: 09/20/2019 Recommendations for Outpatient Follow-up:  1. Follow up with PCP in 1-2 weeks, pulmonology within the next 3 to 5 days 2. Please obtain BMP/CBC in one week Brief/Interim Summary: [name] a 69y.o.malewith medical history significant fortype 2 diabetes, hypertension, and hyperlipidemia who presents to the ED for evaluation ofexpressive aphasia. Patient states that over the last 6-8 months he has felt as if he has been having trouble taking care of his own wellbeing. He has had a mild headache over the last 3 days. Earlier today (09/18/2019), he developed new onset expressive aphasia. He says he was able to hear and comprehend what others were saying but he had difficulty speaking the words he wanted to say. He also had difficulty remembering close friends names. He has had very mild lightheadedness without syncope, fall, or difficulty with ambulation. He denies any chest pain, dyspnea, cough,  nausea, vomiting, abdominal pain,weakness in his arms or legs, or change in sensation. He says he is currently taking aspirin 81 mg every morning. He takes atorvastatin 10 mg daily. He denies any tobacco use. He reports rare social alcohol use. He denies any illicit drug use. In ED initial vitals showed BP 142/86, pulse 72, RR 15, temp 98.5 Fahrenheit, SPO2 95% on room air. Labs notable for sodium 139, potassium 4.0, bicarb 24, BUN 23, creatinine 1.24, LFTs within normal limits, serum glucose 191, WBC 10.0, hemoglobin 14.5, platelets 197,000. SARS-CoV-2 PCR is negative. CT head without contrast shows small to moderate size area of cortical and white matter low-attenuation within the parieto-occipital region on the left consistent with subacute infarct. MRI brain with and without contrast shows a 4 cm acute posterior left MCA territory infarct, small subacute bilateral occipital and cerebellar infarcts, and mild chronic small vessel ischemic disease. Patient was given IV Reglan, IV morphine. Neurology were consulted and will see. The hospitalist service was consulted to admit for further evaluation management.  Patient met as above with acute word finding difficulty noted to have what appears to be both acute infarct as well as subacute infarcts concerning for embolic disease.  CT and MRI as below showing multiple areas of stroke, telemetry did not reveal any dysrhythmias, vascular evaluation with echocardiogram was unremarkable as was CTA of the head and neck.  Unfortunately on CT of the neck there was notable nodule on thyroid at the left lobe as well as hilar mass, dedicated CT chest confirmed 6.1 cm suprahilar mass with associated adenopathy.  Pulmonology was sidelined, will follow up  outpatient tomorrow with PCP per previous schedule and pulmonology will set up for outpatient bronchoscopy for further investigation into newly discovered lung mass.  Patient is otherwise asymptomatic from lung mass,  patient's expressive aphasia appears to have improved drastically.  Following for patient's multiple strokes and will continue patient on core measures including aspirin, Plavix, statin; patient will also continue closely with PCP for hypertension control, diabetes control and ongoing dietary restrictions per recommendations.  Discharge Diagnoses:  Principal Problem:   Stroke [facility]) Active Problems:   Controlled type 2 diabetes mellitus without complication, without long-term current use of insulin ([place])   Hypertension associated with diabetes ([place])   Hyperlipidemia associated with type 2 diabetes mellitus ([place])  Last seen by myself in December for virtual visit At that time I had him increase lisinopril from 5 to 10 mg due to elevated blood pressure reports We discussed Cosens recent diagnosis at hospital stay.  He notes that he is almost back to baseline, he still has some difficulty with word finding and pronunciation.  Otherwise he does not notice any physical symptoms such as limb weakness or difficulty swallowing.  We discussed plans to have him see neurology for follow-up.  He is taking his aspirin, Plavix, statin.  Micheal Oliver did not seem to be clear on his chest CT findings.  We went over this today, I let him know that unfortunately it does appear that he may have lung cancer.  We will get him referred urgently to pulmonology to start work-up  COVID-19 vaccine- done  Eye exam Foot exam due- did today   He lives next door to his 3 granddaughters which he really enjoys. He tried to play pickelball again, but he was getting pain in his calves and feet   Wt Readings from Last 3 Encounters:  09/21/19 207 lb 8 oz (94.1 kg)  09/18/19 214 lb 15.2 oz (97.5 kg)  03/23/19 214 lb (97.1 kg)    Lab Results  Component Value Date   HGBA1C 7.3 (H) 09/19/2019    Patient Active Problem List   Diagnosis Date Noted  . Stroke ([place]) 09/18/2019  . Hypertension associated with diabetes ([place])  09/18/2019  . Hyperlipidemia associated with type 2 diabetes mellitus ([place]) 09/18/2019  . Eyelid twitch 09/28/2018  . Controlled type 2 diabetes mellitus without complication, without long-term current use of insulin ([place]) 07/12/2016  . Hyperlipidemia 01/10/2016  . Family history of early CAD 07/11/2015  . Essential hypertension, benign 01/29/2014  . Erectile dysfunction 10/16/2013    Past Medical History:  Diagnosis Date  . Chicken pox   . Kidney stones    5-6 times  . Measles   . Mumps     Past Surgical History:  Procedure Laterality Date  . WISDOM TOOTH EXTRACTION  1976-77    Social History   Tobacco Use  . Smoking status: Never Smoker  . Smokeless tobacco: Never Used  Substance Use Topics  . Alcohol use: No  . Drug use: No    Family History  Problem Relation Age of Onset  . Alcohol abuse Father 71       Deceased  . Heart disease Father   . Lung disease Father   . Hypertension Father   . Stomach cancer Mother 57       Deceased  . Hypertension Paternal Grandmother   . Dementia Paternal Grandmother   . Stroke Paternal Grandmother   . Arthritis Maternal Grandmother   . Sudden death Paternal Grandfather   . Sudden death Maternal  Grandfather   . Heart attack Paternal Aunt   . Other Maternal Aunt        Natural Causes  . Healthy Son   . Anxiety disorder Daughter     Allergies  Allergen Reactions  . Bee Venom     Lip swelling    Medication list has been reviewed and updated.  Current Outpatient Medications on File Prior to Visit  Medication Sig Dispense Refill  . aspirin 81 MG tablet Take 81 mg by mouth daily.    . Omega-3 Fatty Acids (FISH OIL) 1200 MG CAPS Take by mouth daily.     No current facility-administered medications on file prior to visit.    Review of Systems:  As per HPI- otherwise negative.   Physical Examination: Vitals:   09/21/19 0918  BP: (!) 142/84  Pulse: 83  Resp: 18  Temp: (!) 97.2 F (36.2 C)  SpO2: 96%    Vitals:   09/21/19 0918  Weight: 207 lb 8 oz (94.1 kg)  Height: 6' 2"  (1.88 m)   Body mass index is 26.64 kg/m. Ideal Body Weight: Weight in (lb) to have BMI = 25: 194.3  GEN: no acute distress.  Tall build, looks well.   He has some mild difficulty with word finding and pronunciation, but otherwise seems normal today. HEENT: Atraumatic, Normocephalic.  Ears and Nose: No external deformity. CV: RRR, No M/G/R. No JVD. No thrill. No extra heart sounds. PULM: CTA B, no wheezes, crackles, rhonchi. No retractions. No resp. distress. No accessory muscle use. ABD: S, NT, ND, +BS. No rebound. No HSM. EXTR: No c/c/e PSYCH: Normally interactive. Conversant.  Foot exam done today He cannot sense monofilament right great toe only  Assessment and Plan: Hospital discharge follow-up  Hyperlipidemia, unspecified hyperlipidemia type - Plan: atorvastatin (LIPITOR) 40 MG tablet  Essential hypertension, benign - Plan: lisinopril (ZESTRIL) 10 MG tablet  Controlled type 2 diabetes mellitus without complication, without long-term current use of insulin ([place]) - Plan: metFORMIN (GLUCOPHAGE) 500 MG tablet  Screening for prostate cancer  Eye twitch - Plan: gabapentin (NEURONTIN) 300 MG capsule  History of CVA (cerebrovascular accident) - Plan: clopidogrel (PLAVIX) 75 MG tablet, Ambulatory referral to Neurology  Lung mass - Plan: Ambulatory referral to Pulmonology  Thyroid mass - Plan: US THYROID  Patient seen today for follow-up of recent hospital admission.  He was admitted to the hospital with a CVA  MRI; IMPRESSION: 1. 4 cm acute posterior left MCA territory infarct. 2. Small subacute bilateral occipital and cerebellar infarcts. 3. Mild chronic small vessel ischemic disease Currently taking aspirin and Plavix as well as a statin.  Referral made to neurology today for further evaluation and secondary prevention.  We discussed physical or speech therapy, for the time being Olof feels that  he is almost back to baseline and declines the services Aasim does continue to have a chronic twitch around his right eye.  This has been a long-term issue. No change, he uses gabapentin to suppress symptoms  Blood pressure is adequately controlled at this time A1c during hospitalization shows acceptable control of diabetes Lung mass seen on CT chest during hospital stay, discussed with patient in as much detail as we have right now.  I advised him that unfortunately this may indicate lung cancer.  We will get him into pulmonology for further evaluation urgently.  He has never been a smoker  I also noted a thyroid nodule on his recent neck CT, ordered ultrasound for further evaluation  This  visit occurred during the SARS-CoV-2 public health emergency.  Safety protocols were in place, including screening questions prior to the visit, additional usage of staff PPE, and extensive cleaning of exam room while observing appropriate contact time as indicated for disinfecting solutions.    Signed Lamar Blinks, MD

## 2019-09-18 ENCOUNTER — Emergency Department (HOSPITAL_COMMUNITY): Payer: Medicare HMO

## 2019-09-18 ENCOUNTER — Emergency Department (HOSPITAL_BASED_OUTPATIENT_CLINIC_OR_DEPARTMENT_OTHER): Payer: Medicare HMO

## 2019-09-18 ENCOUNTER — Encounter (HOSPITAL_BASED_OUTPATIENT_CLINIC_OR_DEPARTMENT_OTHER): Payer: Self-pay | Admitting: Emergency Medicine

## 2019-09-18 ENCOUNTER — Inpatient Hospital Stay (HOSPITAL_BASED_OUTPATIENT_CLINIC_OR_DEPARTMENT_OTHER)
Admission: EM | Admit: 2019-09-18 | Discharge: 2019-09-20 | DRG: 066 | Disposition: A | Discharge status: Home / Self Care | Payer: Medicare HMO | Attending: Internal Medicine | Admitting: Internal Medicine

## 2019-09-18 ENCOUNTER — Other Ambulatory Visit: Payer: Self-pay

## 2019-09-18 DIAGNOSIS — H53461 Homonymous bilateral field defects, right side: Secondary | ICD-10-CM | POA: Diagnosis present

## 2019-09-18 DIAGNOSIS — I10 Essential (primary) hypertension: Secondary | ICD-10-CM | POA: Diagnosis present

## 2019-09-18 DIAGNOSIS — E1159 Type 2 diabetes mellitus with other circulatory complications: Secondary | ICD-10-CM | POA: Diagnosis not present

## 2019-09-18 DIAGNOSIS — Z79899 Other long term (current) drug therapy: Secondary | ICD-10-CM | POA: Diagnosis not present

## 2019-09-18 DIAGNOSIS — R4702 Dysphasia: Secondary | ICD-10-CM | POA: Diagnosis not present

## 2019-09-18 DIAGNOSIS — E785 Hyperlipidemia, unspecified: Secondary | ICD-10-CM | POA: Diagnosis present

## 2019-09-18 DIAGNOSIS — I152 Hypertension secondary to endocrine disorders: Secondary | ICD-10-CM

## 2019-09-18 DIAGNOSIS — R519 Headache, unspecified: Secondary | ICD-10-CM | POA: Diagnosis present

## 2019-09-18 DIAGNOSIS — E1169 Type 2 diabetes mellitus with other specified complication: Secondary | ICD-10-CM

## 2019-09-18 DIAGNOSIS — R911 Solitary pulmonary nodule: Secondary | ICD-10-CM | POA: Diagnosis present

## 2019-09-18 DIAGNOSIS — R4701 Aphasia: Secondary | ICD-10-CM | POA: Diagnosis not present

## 2019-09-18 DIAGNOSIS — I639 Cerebral infarction, unspecified: Secondary | ICD-10-CM | POA: Diagnosis present

## 2019-09-18 DIAGNOSIS — E041 Nontoxic single thyroid nodule: Secondary | ICD-10-CM | POA: Diagnosis not present

## 2019-09-18 DIAGNOSIS — Z20822 Contact with and (suspected) exposure to covid-19: Secondary | ICD-10-CM | POA: Diagnosis not present

## 2019-09-18 DIAGNOSIS — Z7982 Long term (current) use of aspirin: Secondary | ICD-10-CM

## 2019-09-18 DIAGNOSIS — R918 Other nonspecific abnormal finding of lung field: Secondary | ICD-10-CM | POA: Diagnosis not present

## 2019-09-18 DIAGNOSIS — R4182 Altered mental status, unspecified: Secondary | ICD-10-CM | POA: Diagnosis not present

## 2019-09-18 DIAGNOSIS — I340 Nonrheumatic mitral (valve) insufficiency: Secondary | ICD-10-CM | POA: Diagnosis not present

## 2019-09-18 DIAGNOSIS — E119 Type 2 diabetes mellitus without complications: Secondary | ICD-10-CM

## 2019-09-18 DIAGNOSIS — I63412 Cerebral infarction due to embolism of left middle cerebral artery: Principal | ICD-10-CM | POA: Diagnosis present

## 2019-09-18 DIAGNOSIS — R29810 Facial weakness: Secondary | ICD-10-CM | POA: Diagnosis present

## 2019-09-18 LAB — COMPREHENSIVE METABOLIC PANEL
ALT: 25 U/L (ref 0–44)
AST: 28 U/L (ref 15–41)
Albumin: 4.4 g/dL (ref 3.5–5.0)
Alkaline Phosphatase: 72 U/L (ref 38–126)
Anion gap: 11 (ref 5–15)
BUN: 23 mg/dL (ref 8–23)
CO2: 24 mmol/L (ref 22–32)
Calcium: 9.3 mg/dL (ref 8.9–10.3)
Chloride: 104 mmol/L (ref 98–111)
Creatinine, Ser: 1.24 mg/dL (ref 0.61–1.24)
GFR calc Af Amer: >60 mL/min (ref >60)
GFR calc non Af Amer: 59 mL/min — ABNORMAL LOW (ref >60)
Glucose, Bld: 191 mg/dL — ABNORMAL HIGH (ref 70–99)
Potassium: 4 mmol/L (ref 3.5–5.1)
Sodium: 139 mmol/L (ref 135–145)
Total Bilirubin: 1.4 mg/dL — ABNORMAL HIGH (ref 0.3–1.2)
Total Protein: 7.8 g/dL (ref 6.5–8.1)

## 2019-09-18 LAB — DIFFERENTIAL
Abs Immature Granulocytes: 0.06 10*3/uL (ref 0.00–0.07)
Basophils Absolute: 0.1 10*3/uL (ref 0.0–0.1)
Basophils Relative: 1 %
Eosinophils Absolute: 0.4 10*3/uL (ref 0.0–0.5)
Eosinophils Relative: 4 %
Immature Granulocytes: 1 %
Lymphocytes Relative: 24 %
Lymphs Abs: 2.4 10*3/uL (ref 0.7–4.0)
Monocytes Absolute: 0.8 10*3/uL (ref 0.1–1.0)
Monocytes Relative: 8 %
Neutro Abs: 6.2 10*3/uL (ref 1.7–7.7)
Neutrophils Relative %: 62 %

## 2019-09-18 LAB — CBC
HCT: 43.6 % (ref 39.0–52.0)
Hemoglobin: 14.5 g/dL (ref 13.0–17.0)
MCH: 30.5 pg (ref 26.0–34.0)
MCHC: 33.3 g/dL (ref 30.0–36.0)
MCV: 91.6 fL (ref 80.0–100.0)
Platelets: 197 10*3/uL (ref 150–400)
RBC: 4.76 MIL/uL (ref 4.22–5.81)
RDW: 13 % (ref 11.5–15.5)
WBC: 10 10*3/uL (ref 4.0–10.5)
nRBC: 0 % (ref 0.0–0.2)

## 2019-09-18 LAB — APTT: aPTT: 26 seconds (ref 24–36)

## 2019-09-18 LAB — PROTIME-INR
INR: 1.1 (ref 0.8–1.2)
Prothrombin Time: 13.9 seconds (ref 11.4–15.2)

## 2019-09-18 LAB — SARS CORONAVIRUS 2 BY RT PCR (HOSPITAL ORDER, PERFORMED IN ~~LOC~~ HOSPITAL LAB): SARS Coronavirus 2: NEGATIVE

## 2019-09-18 LAB — CBG MONITORING, ED: Glucose-Capillary: 178 mg/dL — ABNORMAL HIGH (ref 70–99)

## 2019-09-18 MED ORDER — MORPHINE SULFATE (PF) 2 MG/ML IV SOLN
INTRAVENOUS | Status: AC
  Filled 2019-09-18: qty 1

## 2019-09-18 MED ORDER — ENOXAPARIN SODIUM 40 MG/0.4ML ~~LOC~~ SOLN
40.0000 mg | SUBCUTANEOUS | Status: DC
  Administered 2019-09-18 – 2019-09-19 (×2): 40 mg via SUBCUTANEOUS
  Filled 2019-09-18 (×2): qty 0.4

## 2019-09-18 MED ORDER — ACETAMINOPHEN 325 MG PO TABS
650.0000 mg | ORAL_TABLET | ORAL | Status: DC | PRN
  Administered 2019-09-19 (×3): 650 mg via ORAL
  Filled 2019-09-18 (×3): qty 2

## 2019-09-18 MED ORDER — STROKE: EARLY STAGES OF RECOVERY BOOK
Freq: Once | Status: AC
  Filled 2019-09-18: qty 1

## 2019-09-18 MED ORDER — ASPIRIN EC 81 MG PO TBEC
81.0000 mg | DELAYED_RELEASE_TABLET | Freq: Every day | ORAL | Status: DC
  Administered 2019-09-18 – 2019-09-20 (×3): 81 mg via ORAL
  Filled 2019-09-18 (×3): qty 1

## 2019-09-18 MED ORDER — ATORVASTATIN CALCIUM 40 MG PO TABS
40.0000 mg | ORAL_TABLET | Freq: Every day | ORAL | Status: DC
  Administered 2019-09-19: 40 mg via ORAL
  Filled 2019-09-18: qty 1

## 2019-09-18 MED ORDER — MORPHINE SULFATE (PF) 2 MG/ML IV SOLN
2.0000 mg | Freq: Once | INTRAVENOUS | Status: AC
  Administered 2019-09-18: 2 mg via INTRAVENOUS
  Filled 2019-09-18: qty 1

## 2019-09-18 MED ORDER — ACETAMINOPHEN 160 MG/5ML PO SOLN: 650.0000 mg | ORAL | Status: DC | PRN

## 2019-09-18 MED ORDER — GADOBUTROL 1 MMOL/ML IV SOLN
9.0000 mL | Freq: Once | INTRAVENOUS | Status: AC | PRN
  Administered 2019-09-18: 9 mL via INTRAVENOUS

## 2019-09-18 MED ORDER — CLOPIDOGREL BISULFATE 75 MG PO TABS
75.0000 mg | ORAL_TABLET | Freq: Every day | ORAL | Status: DC
  Administered 2019-09-18 – 2019-09-20 (×3): 75 mg via ORAL
  Filled 2019-09-18 (×4): qty 1

## 2019-09-18 MED ORDER — METOCLOPRAMIDE HCL 5 MG/ML IJ SOLN
10.0000 mg | Freq: Once | INTRAMUSCULAR | Status: AC
  Administered 2019-09-18: 10 mg via INTRAVENOUS
  Filled 2019-09-18: qty 2

## 2019-09-18 MED ORDER — ACETAMINOPHEN 650 MG RE SUPP: 650.0000 mg | RECTAL | Status: DC | PRN

## 2019-09-18 MED ORDER — SENNOSIDES-DOCUSATE SODIUM 8.6-50 MG PO TABS: 1.0000 | ORAL_TABLET | Freq: Every evening | ORAL | Status: DC | PRN

## 2019-09-18 MED ORDER — INSULIN ASPART 100 UNIT/ML ~~LOC~~ SOLN: 0.0000 [IU] | Freq: Three times a day (TID) | SUBCUTANEOUS | Status: DC

## 2019-09-18 NOTE — ED Notes (Signed)
Patient is very irritable after EDP updated them with the CT scan results.  Patient stated that he don't want the swallowing test done at this time.  EDP told them (pt and wife) that if swallowing test is not done, he will not be able to eat or drink.  Wife also explained to the patient and he started screaming at his wife.

## 2019-09-18 NOTE — ED Notes (Signed)
Transported to MRI

## 2019-09-18 NOTE — Consult Note (Signed)
Referring Physician: Dr. Regenia Skeeter    Chief Complaint: Left parietal stroke on MRI  HPI: Micheal Oliver is an 69 y.o. male with a PMHx of DM and kidney stones who presented to the ED today with a c/c of a progressive left temporal headache x 3 days, followed by acute onset of expressive aphasia and vision problems at 10:00 AM. Imaging studies revealed acute and subacute ischemic infarctions bilaterally, suggestive of a cardioembolic etiology. The patient has no prior history of stroke. He takes ASA daily at home.   CT head: 1. Small to moderate-sized area of cortical and white matter low attenuation within the parietooccipital region on the left, with a small area of isodense cortex seen within the periphery of this region. This is consistent with a subacute infarct. MRI correlation is recommended. 2. No evidence of associated midline shift.  MRI brain: 1. 4 cm acute posterior left MCA territory infarct. 2. Small subacute bilateral occipital and cerebellar infarcts. 3. Mild chronic small vessel ischemic disease  EKG: Sinus rhythm RSR' in V1 or V2, right VCD or RVH Borderline T abnormalities, inferior leads  LSN: 1030 tPA Given: No: Out of the tPA time window  Past Medical History:  Diagnosis Date  . Chicken pox   . Kidney stones    5-6 times  . Measles   . Mumps   DM  Past Surgical History:  Procedure Laterality Date  . WISDOM TOOTH EXTRACTION  1976-77    Family History  Problem Relation Age of Onset  . Alcohol abuse Father 59       Deceased  . Heart disease Father   . Lung disease Father   . Hypertension Father   . Stomach cancer Mother 81       Deceased  . Hypertension Paternal Grandmother   . Dementia Paternal Grandmother   . Stroke Paternal Grandmother   . Arthritis Maternal Grandmother   . Sudden death Paternal Grandfather   . Sudden death Maternal Grandfather   . Heart attack Paternal Aunt   . Other Maternal Aunt        Natural Causes  . Healthy Son    . Anxiety disorder Daughter    Social History:  reports that he has never smoked. He has never used smokeless tobacco. He reports that he does not drink alcohol and does not use drugs.  Allergies:  Allergies  Allergen Reactions  . Bee Venom     Lip swelling    Medications:  Prior to Admission:  Medications Prior to Admission  Medication Sig Dispense Refill Last Dose  . aspirin 81 MG tablet Take 81 mg by mouth daily.   09/18/2019 at Unknown time  . atorvastatin (LIPITOR) 10 MG tablet Take 1 tablet (10 mg total) by mouth daily at 6 PM. 90 tablet 3 09/18/2019 at Unknown time  . gabapentin (NEURONTIN) 300 MG capsule Take 1 capsule (300 mg total) by mouth at bedtime. 90 capsule 3 09/17/2019 at Unknown time  . lisinopril (ZESTRIL) 10 MG tablet Take 1 tablet (10 mg total) by mouth daily. 90 tablet 3 09/18/2019 at Unknown time  . metFORMIN (GLUCOPHAGE) 500 MG tablet Take 2 tablets (1,000 mg total) by mouth 2 (two) times daily with a meal. 360 tablet 3 09/18/2019 at Unknown time  . Omega-3 Fatty Acids (FISH OIL) 1200 MG CAPS Take by mouth daily.   09/18/2019 at Unknown time  . tamsulosin (FLOMAX) 0.4 MG CAPS capsule Take 1 capsule (0.4 mg total) by mouth daily. (Patient not taking:  Reported on 09/18/2019) 30 capsule 0 Not Taking at Unknown time   Scheduled: . aspirin EC  81 mg Oral Daily  . atorvastatin  40 mg Oral q1800  . clopidogrel  75 mg Oral Daily  . enoxaparin (LOVENOX) injection  40 mg Subcutaneous Q24H  . insulin aspart  0-9 Units Subcutaneous TID WC    ROS: As per HPI. Does not endorse any additional symptoms.   Physical Examination: Blood pressure (!) 157/87, pulse 67, temperature 98.4 F (36.9 C), temperature source Oral, resp. rate 15, SpO2 98 %.  HEENT: [place]/AT Lungs: Respirations unlabored Ext: No edema  Neurologic Examination: Mental Status: Alert, oriented, thought content appropriate.  Speech with mild dysfluency - has frequent pauses for word finding. Has some difficulty  with naming objects that correspond with low-frequency words, such as "cast" and "fluorescent". Had trouble with a 3 step directional command. No dyscalculia. Cranial Nerves: II:  Visual fields intact when each eye is tested individually. There is extinction on the right with DSS. PERRL.  III,IV, VI: Left palpebral fissure is larger than right. EOMI without nystagmus.  V,VII: Subtle right facial droop. Facial temp sensation equal bilaterally VIII: hearing intact to voice IX,X: No hypophonia XI: Symmetric shoulder shrug XII: Tongue deviates slightly to the right Motor: Right : Upper extremity   5/5    Left:     Upper extremity   5/5  Lower extremity   5/5     Lower extremity   5/5 No pronator drift Sensory: Temp and light touch sensation intact throughout, bilaterally Deep Tendon Reflexes: Normoactive.  Cerebellar: No ataxia with FNF on the left, but did exhibit right-sided dysmetria  Gait: Normal gait and station, but walked unusually close to a chair on his right, suggestive of visual neglect on that side.   Results for orders placed or performed during the hospital encounter of 09/18/19 (from the past 48 hour(s))  Protime-INR     Status: None   Collection Time: 09/18/19  2:36 PM  Result Value Ref Range   Prothrombin Time 13.9 11.4 - 15.2 seconds   INR 1.1 0.8 - 1.2    Comment: (NOTE) INR goal varies based on device and disease states. Performed at [facility], [address] [place], [place] [address]   APTT     Status: None   Collection Time: 09/18/19  2:36 PM  Result Value Ref Range   aPTT 26 24 - 36 seconds    Comment: Performed at [facility], [address] [place], [place] [address]  CBC     Status: None   Collection Time: 09/18/19  2:36 PM  Result Value Ref Range   WBC 10.0 4.0 - 10.5 K/uL   RBC 4.76 4.22 - 5.81 MIL/uL   Hemoglobin 14.5 13.0 - 17.0 g/dL   HCT 43.6 39 - 52 %   MCV 91.6 80.0 - 100.0 fL   MCH 30.5 26.0 - 34.0 pg   MCHC 33.3  30.0 - 36.0 g/dL   RDW 13.0 11.5 - 15.5 %   Platelets 197 150 - 400 K/uL   nRBC 0.0 0.0 - 0.2 %    Comment: Performed at [facility], [address] [place], [place] [address]  Differential     Status: None   Collection Time: 09/18/19  2:36 PM  Result Value Ref Range   Neutrophils Relative % 62 %   Neutro Abs 6.2 1.7 - 7.7 K/uL   Lymphocytes Relative  24 %   Lymphs Abs 2.4 0.7 - 4.0 K/uL   Monocytes Relative 8 %   Monocytes Absolute 0.8 0 - 1 K/uL   Eosinophils Relative 4 %   Eosinophils Absolute 0.4 0 - 0 K/uL   Basophils Relative 1 %   Basophils Absolute 0.1 0 - 0 K/uL   Immature Granulocytes 1 %   Abs Immature Granulocytes 0.06 0.00 - 0.07 K/uL    Comment: Performed at [facility], [address] [place], [place] [address]  Comprehensive metabolic panel     Status: Abnormal   Collection Time: 09/18/19  2:36 PM  Result Value Ref Range   Sodium 139 135 - 145 mmol/L   Potassium 4.0 3.5 - 5.1 mmol/L   Chloride 104 98 - 111 mmol/L   CO2 24 22 - 32 mmol/L   Glucose, Bld 191 (H) 70 - 99 mg/dL    Comment: Glucose reference range applies only to samples taken after fasting for at least 8 hours.   BUN 23 8 - 23 mg/dL   Creatinine, Ser 1.24 0.61 - 1.24 mg/dL   Calcium 9.3 8.9 - 10.3 mg/dL   Total Protein 7.8 6.5 - 8.1 g/dL   Albumin 4.4 3.5 - 5.0 g/dL   AST 28 15 - 41 U/L   ALT 25 0 - 44 U/L   Alkaline Phosphatase 72 38 - 126 U/L   Total Bilirubin 1.4 (H) 0.3 - 1.2 mg/dL   GFR calc non Af Amer 59 (L) >60 mL/min   GFR calc Af Amer >60 >60 mL/min   Anion gap 11 5 - 15    Comment: Performed at [facility], [address] [place], [place] [address]  CBG monitoring, ED     Status: Abnormal   Collection Time: 09/18/19  2:36 PM  Result Value Ref Range   Glucose-Capillary 178 (H) 70 - 99 mg/dL    Comment: Glucose reference range applies only to samples taken after fasting for at least 8 hours.  SARS Coronavirus 2 by RT PCR (hospital order,  performed in [facility] hospital lab) Nasopharyngeal Nasopharyngeal Swab     Status: None   Collection Time: 09/18/19  3:40 PM   Specimen: Nasopharyngeal Swab  Result Value Ref Range   SARS Coronavirus 2 NEGATIVE NEGATIVE    Comment: (NOTE) SARS-CoV-2 target nucleic acids are NOT DETECTED.  The SARS-CoV-2 RNA is generally detectable in upper and lower respiratory specimens during the acute phase of infection. The lowest concentration of SARS-CoV-2 viral copies this assay can detect is 250 copies / mL. A negative result does not preclude SARS-CoV-2 infection and should not be used as the sole basis for treatment or other patient management decisions.  A negative result may occur with improper specimen collection / handling, submission of specimen other than nasopharyngeal swab, presence of viral mutation(s) within the areas targeted by this assay, and inadequate number of viral copies (<250 copies / mL). A negative result must be combined with clinical observations, patient history, and epidemiological information.  Fact Sheet for Patients:   StrictlyIdeas.no  Fact Sheet for Healthcare Providers: BankingDealers.co.za  This test is not yet approved or  cleared by the [place] FDA and has been authorized for detection and/or diagnosis of SARS-CoV-2 by FDA under an Emergency Use Authorization (EUA).  This EUA will remain in effect (meaning this test can be used) for the duration of the COVID-19 declaration under Section 564(b)(1) of the Act, 21 U.S.C. section  360bbb-3(b)(1), unless the authorization is terminated or revoked sooner.  Performed at [facility], [address], [place], [place] [address]    CT HEAD WO CONTRAST  Result Date: 09/18/2019 CLINICAL DATA:  Altered mental status. EXAM: CT HEAD WITHOUT CONTRAST TECHNIQUE: Contiguous axial images were obtained from the base of the skull through the vertex without  intravenous contrast. COMPARISON:  None. FINDINGS: Brain: There is mild cerebral atrophy with widening of the extra-axial spaces and ventricular dilatation. There are areas of decreased attenuation within the white matter tracts of the supratentorial brain, consistent with microvascular disease changes. A small to moderate sized area of cortical and white matter low attenuation is seen within the parietooccipital region on the left (axial CT images 14 through 19, CT series number 2). An area of isodense cortex is seen within the periphery of this region (axial CT image 16, CT series number 2). Very mild sulcal effacement is noted. There is no evidence of associated midline shift. Vascular: No hyperdense vessel or unexpected calcification. Skull: Normal. Negative for fracture or focal lesion. Sinuses/Orbits: No acute finding. Other: None. IMPRESSION: 1. Small to moderate-sized area of cortical and white matter low attenuation within the parietooccipital region on the left, with a small area of isodense cortex seen within the periphery of this region. This is consistent with a subacute infarct. MRI correlation is recommended. 2. No evidence of associated midline shift. Electronically Signed   By: Virgina Norfolk M.D.   On: 09/18/2019 15:20   MR BRAIN W WO CONTRAST  Result Date: 09/18/2019 CLINICAL DATA:  Headache for 3 days. Aphasia in visual disturbance today. EXAM: MRI HEAD WITHOUT AND WITH CONTRAST TECHNIQUE: Multiplanar, multiecho pulse sequences of the brain and surrounding structures were obtained without and with intravenous contrast. CONTRAST:  31mL GADAVIST GADOBUTROL 1 MMOL/ML IV SOLN COMPARISON:  Head CT 09/18/2019 FINDINGS: Brain: There is a 4 cm acute posterior left MCA territory infarct near the junction of the temporal, occipital, and parietal lobes. Immediately posterior to this in the lateral aspect of the left occipital lobe is a 2.5 cm region of signal abnormality most compatible with a  subacute infarct with a small amount of petechial blood products. There are also subcentimeter foci of mild trace diffusion weighted signal hyperintensity, normal ADC, and mild T2 hyperintensity in the right occipital lobe and both cerebellar hemispheres likely reflecting subacute infarcts. There is associated enhancement of the infarcts in both occipital lobes and right cerebellum. T2 hyperintensities elsewhere in the cerebral white matter bilaterally are nonspecific but compatible with mild chronic small vessel ischemic disease. The ventricles are normal in size. No mass, midline shift, or extra-axial fluid collection is identified. Vascular: Major intracranial vascular flow voids are preserved. Skull and upper cervical spine: Unremarkable bone marrow signal. Sinuses/Orbits: Unremarkable orbits. Clear paranasal sinuses. Small right mastoid effusion. Other: None. IMPRESSION: 1. 4 cm acute posterior left MCA territory infarct. 2. Small subacute bilateral occipital and cerebellar infarcts. 3. Mild chronic small vessel ischemic disease. Electronically Signed   By: Logan Bores M.D.   On: 09/18/2019 19:12    Assessment: 69 y.o. male with a PMHx of DM and kidney stones who presented to the ED with a c/c of a progressive left temporal headache x 3 days, followed by acute onset of expressive aphasia and vision problems at 1030 AM on Friday. Imaging studies revealed acute and subacute ischemic infarctions bilaterally, suggestive of a cardioembolic etiology.  1. Exam reveals visual extinction on the right, mild right  facial droop and expressive dysphasia.  2. MRI brain reveals a 4 cm acute posterior left MCA territory infarct and small subacute bilateral occipital lobe and cerebellar ischemic infarcts on a background of mild chronic small vessel ischemic disease. Overall imaging findings are suggestive of a cardioembolic etiology.  3. Also seen is a small focus of left occipital hemorrhage on MRI with associated  enhancement. DDx includes septic embolus and underlying malignancy.  4. Stroke Risk Factors - DM  Recommendations: 1. HgbA1c, fasting lipid panel 2. CTA of head and neck (ordered) 3. PT consult, OT consult, Speech consult 4. TTE (ordered). If negative for mural thrombus, obtain TEE 5. Prophylactic therapy-Continue ASA with addition of Plavix for now. May need to be switched to a DOAC, pending results of stroke work up. Of note, cannot anticoagulate for now due to the subacute small hemorrhage seen on MRI; if repeat CT head shows stability in 1 week, would consider starting a DOAC. 6. Agree with increasing Lipitor to 40 mg qd. Obtain baseline CK level (ordered).  7. Risk factor modification 8. Telemetry monitoring. If negative for a-fib, may need to be scheduled for loop recorder placement. 9. Frequent neuro checks 10. Blood cultures x 2.  11. BP management. Will be out of the permissive HTN time window as of 1000 today.  12. Outpatient stroke neurology follow up to include repeat MRI brain with contrast in 3 months to assess for resolution of the abnormal left occipital lobe peri-hemorrhagic enhancement seen on the current MRI.  13. Will need to be cleared by the [facility] prior to resuming driving due to his right sided visual neglect. The patient expressed understanding and agreement.    @Electronically  signed: Dr. Kerney Elbe 09/18/2019, 7:49 PM

## 2019-09-18 NOTE — ED Provider Notes (Signed)
Pt presenting as transfer from [place] ED for MRI brain w/wo contrast. Pt presented today with headache and confusion. Reports difficulty finding some words as well. States he has problems recalling some memories such as what street he used to live on. He also endorses some blurry vision intermittently today. Last known normal 10am. CT head with abnormal findings, sent to this ED for MRI to evaluated for stroke vs possible mass. Discussed with Dr. Rory Percy with neurology, recommends MRI brain w/wo contrast. Physical Exam  BP (!) 157/87 (BP Location: Right Arm)   Pulse 67   Temp 98.4 F (36.9 C) (Oral)   Resp 15   SpO2 98%   Physical Exam Vitals and nursing note reviewed.  Constitutional:      Appearance: He is well-developed.  HENT:     Head: Normocephalic and atraumatic.  Eyes:     Conjunctiva/sclera: Conjunctivae normal.  Cardiovascular:     Rate and Rhythm: Normal rate.  Pulmonary:     Effort: Pulmonary effort is normal.  Abdominal:     Palpations: Abdomen is soft.  Skin:    General: Skin is warm.  Neurological:     Mental Status: He is alert.     Comments: Mild ptosis right upper lid (pt reports is chronic) Some slight intermittent expressive aphasia. Oriented Normal coordination, tone, sensation. No pronator drift.  Psychiatric:        Mood and Affect: Mood normal.        Behavior: Behavior normal.    Results for orders placed or performed during the hospital encounter of 09/18/19  SARS Coronavirus 2 by RT PCR (hospital order, performed in [place] hospital lab) Nasopharyngeal Nasopharyngeal Swab   Specimen: Nasopharyngeal Swab  Result Value Ref Range   SARS Coronavirus 2 NEGATIVE NEGATIVE  Protime-INR  Result Value Ref Range   Prothrombin Time 13.9 11.4 - 15.2 seconds   INR 1.1 0.8 - 1.2  APTT  Result Value Ref Range   aPTT 26 24 - 36 seconds  CBC  Result Value Ref Range   WBC 10.0 4.0 - 10.5 K/uL   RBC 4.76 4.22 - 5.81 MIL/uL   Hemoglobin 14.5  13.0 - 17.0 g/dL   HCT 43.6 39 - 52 %   MCV 91.6 80.0 - 100.0 fL   MCH 30.5 26.0 - 34.0 pg   MCHC 33.3 30.0 - 36.0 g/dL   RDW 13.0 11.5 - 15.5 %   Platelets 197 150 - 400 K/uL   nRBC 0.0 0.0 - 0.2 %  Differential  Result Value Ref Range   Neutrophils Relative % 62 %   Neutro Abs 6.2 1.7 - 7.7 K/uL   Lymphocytes Relative 24 %   Lymphs Abs 2.4 0.7 - 4.0 K/uL   Monocytes Relative 8 %   Monocytes Absolute 0.8 0 - 1 K/uL   Eosinophils Relative 4 %   Eosinophils Absolute 0.4 0 - 0 K/uL   Basophils Relative 1 %   Basophils Absolute 0.1 0 - 0 K/uL   Immature Granulocytes 1 %   Abs Immature Granulocytes 0.06 0.00 - 0.07 K/uL  Comprehensive metabolic panel  Result Value Ref Range   Sodium 139 135 - 145 mmol/L   Potassium 4.0 3.5 - 5.1 mmol/L   Chloride 104 98 - 111 mmol/L   CO2 24 22 - 32 mmol/L   Glucose, Bld 191 (H) 70 - 99 mg/dL   BUN 23 8 - 23 mg/dL   Creatinine, Ser 1.24 0.61 - 1.24 mg/dL  Calcium 9.3 8.9 - 10.3 mg/dL   Total Protein 7.8 6.5 - 8.1 g/dL   Albumin 4.4 3.5 - 5.0 g/dL   AST 28 15 - 41 U/L   ALT 25 0 - 44 U/L   Alkaline Phosphatase 72 38 - 126 U/L   Total Bilirubin 1.4 (H) 0.3 - 1.2 mg/dL   GFR calc non Af Amer 59 (L) >60 mL/min   GFR calc Af Amer >60 >60 mL/min   Anion gap 11 5 - 15  CBG monitoring, ED  Result Value Ref Range   Glucose-Capillary 178 (H) 70 - 99 mg/dL   CT HEAD WO CONTRAST  Result Date: 09/18/2019 CLINICAL DATA:  Altered mental status. EXAM: CT HEAD WITHOUT CONTRAST TECHNIQUE: Contiguous axial images were obtained from the base of the skull through the vertex without intravenous contrast. COMPARISON:  None. FINDINGS: Brain: There is mild cerebral atrophy with widening of the extra-axial spaces and ventricular dilatation. There are areas of decreased attenuation within the white matter tracts of the supratentorial brain, consistent with microvascular disease changes. A small to moderate sized area of cortical and white matter low attenuation is  seen within the parietooccipital region on the left (axial CT images 14 through 19, CT series number 2). An area of isodense cortex is seen within the periphery of this region (axial CT image 16, CT series number 2). Very mild sulcal effacement is noted. There is no evidence of associated midline shift. Vascular: No hyperdense vessel or unexpected calcification. Skull: Normal. Negative for fracture or focal lesion. Sinuses/Orbits: No acute finding. Other: None. IMPRESSION: 1. Small to moderate-sized area of cortical and white matter low attenuation within the parietooccipital region on the left, with a small area of isodense cortex seen within the periphery of this region. This is consistent with a subacute infarct. MRI correlation is recommended. 2. No evidence of associated midline shift. Electronically Signed   By: Virgina Norfolk M.D.   On: 09/18/2019 15:20   MR BRAIN W WO CONTRAST  Result Date: 09/18/2019 CLINICAL DATA:  Headache for 3 days. Aphasia in visual disturbance today. EXAM: MRI HEAD WITHOUT AND WITH CONTRAST TECHNIQUE: Multiplanar, multiecho pulse sequences of the brain and surrounding structures were obtained without and with intravenous contrast. CONTRAST:  48mL GADAVIST GADOBUTROL 1 MMOL/ML IV SOLN COMPARISON:  Head CT 09/18/2019 FINDINGS: Brain: There is a 4 cm acute posterior left MCA territory infarct near the junction of the temporal, occipital, and parietal lobes. Immediately posterior to this in the lateral aspect of the left occipital lobe is a 2.5 cm region of signal abnormality most compatible with a subacute infarct with a small amount of petechial blood products. There are also subcentimeter foci of mild trace diffusion weighted signal hyperintensity, normal ADC, and mild T2 hyperintensity in the right occipital lobe and both cerebellar hemispheres likely reflecting subacute infarcts. There is associated enhancement of the infarcts in both occipital lobes and right cerebellum. T2  hyperintensities elsewhere in the cerebral white matter bilaterally are nonspecific but compatible with mild chronic small vessel ischemic disease. The ventricles are normal in size. No mass, midline shift, or extra-axial fluid collection is identified. Vascular: Major intracranial vascular flow voids are preserved. Skull and upper cervical spine: Unremarkable bone marrow signal. Sinuses/Orbits: Unremarkable orbits. Clear paranasal sinuses. Small right mastoid effusion. Other: None. IMPRESSION: 1. 4 cm acute posterior left MCA territory infarct. 2. Small subacute bilateral occipital and cerebellar infarcts. 3. Mild chronic small vessel ischemic disease. Electronically Signed   By: Zenia Resides  Jeralyn Ruths M.D.   On: 09/18/2019 19:12    ED Course/Procedures   Clinical Course as of Sep 18 2046  Fri Sep 18, 2019  1940 Dr. Cheral Marker with neurology aware of MRI results, to evaluate pt.   [JR]  2031 Dr. Posey Pronto accepting admission.    [JR]    Clinical Course User Index [JR] Anyeli Hockenbury, [place] N, PA-C    Procedures  MDM  Pt MRI with acute post left CVA infarct, as well as small subacute b/l occipital and cerebellar infarcts. Dr. Cheral Marker with neuro will evaluate pt. Pt made aware of findings and is agreeable with admission. [facility] consulted for admission.       Sindee Stucker, [place] N, PA-C 09/18/19 2049    Sherwood Gambler, MD 09/19/19 [phone]

## 2019-09-18 NOTE — ED Notes (Signed)
Patient stated that every time he drinks or eats he coughs violently after 5 minutes of eating.

## 2019-09-18 NOTE — ED Provider Notes (Signed)
[facility] EMERGENCY DEPARTMENT Provider Note   CSN: [id number] Arrival date & time: 09/18/19  1426     History Chief Complaint  Patient presents with  . Headache  . Memory Loss    Micheal Oliver is a 69 y.o. male.  The history is provided by the patient, medical records and the spouse. No language interpreter was used.  Headache  Micheal Oliver is a 69 y.o. male who presents to the Emergency Department complaining of headache and confusion. He presents the emergency department accompanied by his wife for evaluation of three days of progressive left temporal headache. Headache is gradual in onset. It is waxing and waning. This morning at 6 AM he woke up in his normal neurologic state. He noticed around 10 o'clock this morning that he was having difficulty with word finding and memories (could not name what street he used to live on and had difficulty saying and spelling map). He also had difficulty seeing when he was trying to play a videogame. He states that he could read the words but had to have things very close to his face in order to read. He denies any fevers, nausea, vomiting, numbness, weakness. No prior similar symptoms. He has a history of diabetes, no additional medical problems.    Past Medical History:  Diagnosis Date  . Chicken pox   . Kidney stones    5-6 times  . Measles   . Mumps     Patient Active Problem List   Diagnosis Date Noted  . Eyelid twitch 09/28/2018  . Controlled type 2 diabetes mellitus without complication, without long-term current use of insulin ([place]) 07/12/2016  . Hyperlipidemia 01/10/2016  . Family history of early CAD 07/11/2015  . Essential hypertension, benign 01/29/2014  . Erectile dysfunction 10/16/2013    Past Surgical History:  Procedure Laterality Date  . WISDOM TOOTH EXTRACTION  1976-77       Family History  Problem Relation Age of Onset  . Alcohol abuse Father 36       Deceased  . Heart disease Father   .  Lung disease Father   . Hypertension Father   . Stomach cancer Mother 54       Deceased  . Hypertension Paternal Grandmother   . Dementia Paternal Grandmother   . Stroke Paternal Grandmother   . Arthritis Maternal Grandmother   . Sudden death Paternal Grandfather   . Sudden death Maternal Grandfather   . Heart attack Paternal Aunt   . Other Maternal Aunt        Natural Causes  . Healthy Son   . Anxiety disorder Daughter     Social History   Tobacco Use  . Smoking status: Never Smoker  . Smokeless tobacco: Never Used  Substance Use Topics  . Alcohol use: No  . Drug use: No    Home Medications Prior to Admission medications   Medication Sig Start Date End Date Taking? Authorizing Provider  aspirin 81 MG tablet Take 81 mg by mouth daily.   Yes [provider]  atorvastatin (LIPITOR) 10 MG tablet Take 1 tablet (10 mg total) by mouth daily at 6 PM. 09/29/18  Yes Copland, Gay Filler, MD  gabapentin (NEURONTIN) 300 MG capsule Take 1 capsule (300 mg total) by mouth at bedtime. 09/29/18  Yes Copland, Gay Filler, MD  lisinopril (ZESTRIL) 10 MG tablet Take 1 tablet (10 mg total) by mouth daily. 09/29/18  Yes Copland, Gay Filler, MD  Omega-3 Fatty Acids (FISH OIL) 1200  MG CAPS Take by mouth daily.   Yes [provider]  metFORMIN (GLUCOPHAGE) 500 MG tablet Take 2 tablets (1,000 mg total) by mouth 2 (two) times daily with a meal. 10/01/18   Copland, Gay Filler, MD  tamsulosin (FLOMAX) 0.4 MG CAPS capsule Take 1 capsule (0.4 mg total) by mouth daily. 11/24/18   Charlesetta Shanks, MD    Allergies    Bee venom  Review of Systems   Review of Systems  Neurological: Positive for headaches.  All other systems reviewed and are negative.   Physical Exam Updated Vital Signs Pulse 78   Temp 99.4 F (37.4 C) (Oral)   Resp 16   SpO2 98%   Physical Exam Vitals and nursing note reviewed.  Constitutional:      Appearance: He is well-developed.  HENT:     Head: Normocephalic and  atraumatic.  Eyes:     Extraocular Movements: Extraocular movements intact.     Pupils: Pupils are equal, round, and reactive to light.  Cardiovascular:     Rate and Rhythm: Normal rate and regular rhythm.     Heart sounds: No murmur heard.   Pulmonary:     Effort: Pulmonary effort is normal. No respiratory distress.     Breath sounds: Normal breath sounds.  Abdominal:     Palpations: Abdomen is soft.     Tenderness: There is no abdominal tenderness. There is no guarding or rebound.  Musculoskeletal:        General: No swelling or tenderness.  Skin:    General: Skin is warm and dry.  Neurological:     Mental Status: He is alert and oriented to person, place, and time.     Comments: Visual fields are grossly intact. No asymmetry of facial movements. No pronated or drift. Five out of five strength in all four extremities with sensation to light touch intact in all four extremities. Mild expressive aphasia.  Psychiatric:        Behavior: Behavior normal.     ED Results / Procedures / Treatments   Labs (all labs ordered are listed, but only abnormal results are displayed) Labs Reviewed  COMPREHENSIVE METABOLIC PANEL - Abnormal; Notable for the following components:      Result Value   Glucose, Bld 191 (*)    Total Bilirubin 1.4 (*)    GFR calc non Af Amer 59 (*)    All other components within normal limits  CBG MONITORING, ED - Abnormal; Notable for the following components:   Glucose-Capillary 178 (*)    All other components within normal limits  SARS CORONAVIRUS 2 BY RT PCR (HOSPITAL ORDER, [facility])  PROTIME-INR  APTT  CBC  DIFFERENTIAL    EKG EKG Interpretation  Date/Time:  Friday September 18 2019 14:35:06 EDT Ventricular Rate:  87 PR Interval:    QRS Duration: 91 QT Interval:  377 QTC Calculation: 454 R Axis:   14 Text Interpretation: Sinus rhythm RSR' in V1 or V2, right VCD or RVH Borderline T abnormalities, inferior leads no prior  available for comparison Confirmed by Quintella Reichert [phone]) on 09/18/2019 3:09:34 PM   Radiology CT HEAD WO CONTRAST  Result Date: 09/18/2019 CLINICAL DATA:  Altered mental status. EXAM: CT HEAD WITHOUT CONTRAST TECHNIQUE: Contiguous axial images were obtained from the base of the skull through the vertex without intravenous contrast. COMPARISON:  None. FINDINGS: Brain: There is mild cerebral atrophy with widening of the extra-axial spaces and ventricular dilatation. There are areas  of decreased attenuation within the white matter tracts of the supratentorial brain, consistent with microvascular disease changes. A small to moderate sized area of cortical and white matter low attenuation is seen within the parietooccipital region on the left (axial CT images 14 through 19, CT series number 2). An area of isodense cortex is seen within the periphery of this region (axial CT image 16, CT series number 2). Very mild sulcal effacement is noted. There is no evidence of associated midline shift. Vascular: No hyperdense vessel or unexpected calcification. Skull: Normal. Negative for fracture or focal lesion. Sinuses/Orbits: No acute finding. Other: None. IMPRESSION: 1. Small to moderate-sized area of cortical and white matter low attenuation within the parietooccipital region on the left, with a small area of isodense cortex seen within the periphery of this region. This is consistent with a subacute infarct. MRI correlation is recommended. 2. No evidence of associated midline shift. Electronically Signed   By: Virgina Norfolk M.D.   On: 09/18/2019 15:20    Procedures Procedures (including critical care time)  Medications Ordered in ED Medications  metoCLOPramide (REGLAN) injection 10 mg (10 mg Intravenous Given 09/18/19 1535)    ED Course  I have reviewed the triage vital signs and the nursing notes.  Pertinent labs & imaging results that were available during my care of the patient were reviewed by  me and considered in my medical decision making (see chart for details).  Clinical Course as of Sep 19 47  Fri Sep 18, 2019  1940 Dr. Cheral Marker with neurology aware of MRI results, to evaluate pt.   [JR]  2031 Dr. Posey Pronto accepting admission.    [JR]    Clinical Course User Index [JR] Robinson, [place] N, PA-C   MDM Rules/Calculators/A&P                         Patient here for evaluation of speech difficulties that began earlier today. On examination he has mild expressive aphasia, no additional deficits. He is out of the TPA window. CT scan demonstrates possible subacute infarct. Discussed with neurologist, Dr. Rory Percy who evaluated patient scans. He recommends transfer to the [facility] emergency department for MRI to differentiate CVA versus mass. Discussed with patient findings of studies recommendation for transfer and he is in agreement with treatment plan. Dr. Stark Jock in the [facility] emergency department who accepts the patient in transfer.  Final Clinical Impression(s) / ED Diagnoses Final diagnoses:  None    Rx / DC Orders ED Discharge Orders    None       Quintella Reichert, MD 09/19/19 [phone]

## 2019-09-18 NOTE — H&P (Signed)
History and Physical    Micheal Oliver TTS:[id number] DOB: 22-Dec-1950 DOA: 09/18/2019  PCP: Darreld Mclean, MD  Patient coming from: [place] ED via home  I have personally briefly reviewed patient's old medical records in [place]  Chief Complaint: Headache, confusion, expressive aphasia  HPI: Micheal Oliver is a 69 y.o. male with medical history significant for type 2 diabetes, hypertension, and hyperlipidemia who presents to the ED for evaluation of expressive aphasia.  Patient states that over the last 6-8 months he has felt as if he has been having trouble taking care of his own wellbeing.  He has had a mild headache over the last 3 days.  Earlier today (09/18/2019), he developed new onset expressive aphasia.  He says he was able to hear and comprehend what others were saying but he had difficulty speaking the words he wanted to say.  He also had difficulty remembering close friends names.  He has had very mild lightheadedness without syncope, fall, or difficulty with ambulation.  He denies any chest pain, dyspnea, cough, nausea, vomiting, abdominal pain, weakness in his arms or legs, or change in sensation.  He says he is currently taking aspirin 81 mg every morning.  He takes atorvastatin 10 mg daily.  He denies any tobacco use.  He reports rare social alcohol use.  He denies any illicit drug use.  ED Course:  Initial vitals showed BP 142/86, pulse 72, RR 15, temp 98.5 Fahrenheit, SPO2 95% on room air.  Labs notable for sodium 139, potassium 4.0, bicarb 24, BUN 23, creatinine 1.24, LFTs within normal limits, serum glucose 191, WBC 10.0, hemoglobin 14.5, platelets 197,000.  SARS-CoV-2 PCR is negative.  CT head without contrast shows small to moderate size area of cortical and white matter low-attenuation within the parieto-occipital region on the left consistent with subacute infarct.  MRI brain with and without contrast shows a 4 cm acute posterior left MCA  territory infarct, small subacute bilateral occipital and cerebellar infarcts, and mild chronic small vessel ischemic disease.  Patient was given IV Reglan, IV morphine.  Neurology were consulted and will see.  The hospitalist service was consulted to admit for further evaluation management.  Review of Systems: All systems reviewed and are negative except as documented in history of present illness above.   Past Medical History:  Diagnosis Date  . Chicken pox   . Kidney stones    5-6 times  . Measles   . Mumps     Past Surgical History:  Procedure Laterality Date  . WISDOM TOOTH EXTRACTION  1976-77    Social History:  reports that he has never smoked. He has never used smokeless tobacco. He reports that he does not drink alcohol and does not use drugs.  Allergies  Allergen Reactions  . Bee Venom     Lip swelling    Family History  Problem Relation Age of Onset  . Alcohol abuse Father 68       Deceased  . Heart disease Father   . Lung disease Father   . Hypertension Father   . Stomach cancer Mother 12       Deceased  . Hypertension Paternal Grandmother   . Dementia Paternal Grandmother   . Stroke Paternal Grandmother   . Arthritis Maternal Grandmother   . Sudden death Paternal Grandfather   . Sudden death Maternal Grandfather   . Heart attack Paternal Aunt   . Other Maternal Aunt  Natural Causes  . Healthy Son   . Anxiety disorder Daughter      Prior to Admission medications   Medication Sig Start Date End Date Taking? Authorizing Provider  aspirin 81 MG tablet Take 81 mg by mouth daily.   Yes [provider]  atorvastatin (LIPITOR) 10 MG tablet Take 1 tablet (10 mg total) by mouth daily at 6 PM. 09/29/18  Yes Copland, Gay Filler, MD  gabapentin (NEURONTIN) 300 MG capsule Take 1 capsule (300 mg total) by mouth at bedtime. 09/29/18  Yes Copland, Gay Filler, MD  lisinopril (ZESTRIL) 10 MG tablet Take 1 tablet (10 mg total) by mouth daily. 09/29/18  Yes  Copland, Gay Filler, MD  metFORMIN (GLUCOPHAGE) 500 MG tablet Take 2 tablets (1,000 mg total) by mouth 2 (two) times daily with a meal. 10/01/18  Yes Copland, Gay Filler, MD  Omega-3 Fatty Acids (FISH OIL) 1200 MG CAPS Take by mouth daily.   Yes [provider]  tamsulosin (FLOMAX) 0.4 MG CAPS capsule Take 1 capsule (0.4 mg total) by mouth daily. Patient not taking: Reported on 09/18/2019 11/24/18   Charlesetta Shanks, MD    Physical Exam: Vitals:   09/18/19 1511 09/18/19 1600 09/18/19 1745 09/18/19 2019  BP:  (!) 142/86 (!) 157/87 138/86  Pulse: 78 72 67 65  Resp:  15 15 16   Temp: 99.4 F (37.4 C) 98.5 F (36.9 C) 98.4 F (36.9 C) 98 F (36.7 C)  TempSrc: Oral Oral Oral Oral  SpO2: 98% 95% 98% 100%   Constitutional: Resting supine in bed, NAD, calm, comfortable Eyes: PERRL, EOMI, lids and conjunctivae normal ENMT: Mucous membranes are moist. Posterior pharynx clear of any exudate or lesions.Normal dentition.  Neck: normal, supple, no masses. Respiratory: clear to auscultation bilaterally, no wheezing, no crackles. Normal respiratory effort. No accessory muscle use.  Cardiovascular: Regular rate and rhythm, no murmurs / rubs / gallops. No extremity edema. 2+ pedal pulses. Abdomen: no tenderness, no masses palpated. No hepatosplenomegaly. Bowel sounds positive.  Musculoskeletal: no clubbing / cyanosis. No joint deformity upper and lower extremities. Good ROM, no contractures. Normal muscle tone.  Skin: no rashes, lesions, ulcers. No induration Neurologic: Intermittent expressive aphasia otherwise CN 2-12 grossly intact. Sensation intact, Strength 5/5 in all 4, no dysmetria. Psychiatric: Normal judgment and insight. Alert and oriented x 3. Normal mood.   Labs on Admission: I have personally reviewed following labs and imaging studies  CBC: Recent Labs  Lab 09/18/19 1436  WBC 10.0  NEUTROABS 6.2  HGB 14.5  HCT 43.6  MCV 91.6  PLT 676   Basic Metabolic Panel: Recent Labs    Lab 09/18/19 1436  NA 139  K 4.0  CL 104  CO2 24  GLUCOSE 191*  BUN 23  CREATININE 1.24  CALCIUM 9.3   GFR: CrCl cannot be calculated (Unknown ideal weight.). Liver Function Tests: Recent Labs  Lab 09/18/19 1436  AST 28  ALT 25  ALKPHOS 72  BILITOT 1.4*  PROT 7.8  ALBUMIN 4.4   No results for input(s): LIPASE, AMYLASE in the last 168 hours. No results for input(s): AMMONIA in the last 168 hours. Coagulation Profile: Recent Labs  Lab 09/18/19 1436  INR 1.1   Cardiac Enzymes: No results for input(s): CKTOTAL, CKMB, CKMBINDEX, TROPONINI in the last 168 hours. BNP (last 3 results) No results for input(s): PROBNP in the last 8760 hours. HbA1C: No results for input(s): HGBA1C in the last 72 hours. CBG: Recent Labs  Lab 09/18/19 1436  GLUCAP  178*   Lipid Profile: No results for input(s): CHOL, HDL, LDLCALC, TRIG, CHOLHDL, LDLDIRECT in the last 72 hours. Thyroid Function Tests: No results for input(s): TSH, T4TOTAL, FREET4, T3FREE, THYROIDAB in the last 72 hours. Anemia Panel: No results for input(s): VITAMINB12, FOLATE, FERRITIN, TIBC, IRON, RETICCTPCT in the last 72 hours. Urine analysis:    Component Value Date/Time   COLORURINE BROWN (A) 11/24/2018 1601   APPEARANCEUR TURBID (A) 11/24/2018 1601   LABSPEC >1.030 (H) 11/24/2018 1601   PHURINE 5.5 11/24/2018 1601   GLUCOSEU NEGATIVE 11/24/2018 1601   GLUCOSEU NEGATIVE 07/11/2015 0835   HGBUR LARGE (A) 11/24/2018 1601   BILIRUBINUR SMALL (A) 11/24/2018 1601   KETONESUR 15 (A) 11/24/2018 1601   PROTEINUR 100 (A) 11/24/2018 1601   UROBILINOGEN 0.2 07/11/2015 0835   NITRITE NEGATIVE 11/24/2018 1601   LEUKOCYTESUR NEGATIVE 11/24/2018 1601    Radiological Exams on Admission: CT HEAD WO CONTRAST  Result Date: 09/18/2019 CLINICAL DATA:  Altered mental status. EXAM: CT HEAD WITHOUT CONTRAST TECHNIQUE: Contiguous axial images were obtained from the base of the skull through the vertex without intravenous  contrast. COMPARISON:  None. FINDINGS: Brain: There is mild cerebral atrophy with widening of the extra-axial spaces and ventricular dilatation. There are areas of decreased attenuation within the white matter tracts of the supratentorial brain, consistent with microvascular disease changes. A small to moderate sized area of cortical and white matter low attenuation is seen within the parietooccipital region on the left (axial CT images 14 through 19, CT series number 2). An area of isodense cortex is seen within the periphery of this region (axial CT image 16, CT series number 2). Very mild sulcal effacement is noted. There is no evidence of associated midline shift. Vascular: No hyperdense vessel or unexpected calcification. Skull: Normal. Negative for fracture or focal lesion. Sinuses/Orbits: No acute finding. Other: None. IMPRESSION: 1. Small to moderate-sized area of cortical and white matter low attenuation within the parietooccipital region on the left, with a small area of isodense cortex seen within the periphery of this region. This is consistent with a subacute infarct. MRI correlation is recommended. 2. No evidence of associated midline shift. Electronically Signed   By: Virgina Norfolk M.D.   On: 09/18/2019 15:20   MR BRAIN W WO CONTRAST  Result Date: 09/18/2019 CLINICAL DATA:  Headache for 3 days. Aphasia in visual disturbance today. EXAM: MRI HEAD WITHOUT AND WITH CONTRAST TECHNIQUE: Multiplanar, multiecho pulse sequences of the brain and surrounding structures were obtained without and with intravenous contrast. CONTRAST:  56mL GADAVIST GADOBUTROL 1 MMOL/ML IV SOLN COMPARISON:  Head CT 09/18/2019 FINDINGS: Brain: There is a 4 cm acute posterior left MCA territory infarct near the junction of the temporal, occipital, and parietal lobes. Immediately posterior to this in the lateral aspect of the left occipital lobe is a 2.5 cm region of signal abnormality most compatible with a subacute infarct  with a small amount of petechial blood products. There are also subcentimeter foci of mild trace diffusion weighted signal hyperintensity, normal ADC, and mild T2 hyperintensity in the right occipital lobe and both cerebellar hemispheres likely reflecting subacute infarcts. There is associated enhancement of the infarcts in both occipital lobes and right cerebellum. T2 hyperintensities elsewhere in the cerebral white matter bilaterally are nonspecific but compatible with mild chronic small vessel ischemic disease. The ventricles are normal in size. No mass, midline shift, or extra-axial fluid collection is identified. Vascular: Major intracranial vascular flow voids are preserved. Skull and upper cervical spine:  Unremarkable bone marrow signal. Sinuses/Orbits: Unremarkable orbits. Clear paranasal sinuses. Small right mastoid effusion. Other: None. IMPRESSION: 1. 4 cm acute posterior left MCA territory infarct. 2. Small subacute bilateral occipital and cerebellar infarcts. 3. Mild chronic small vessel ischemic disease. Electronically Signed   By: Logan Bores M.D.   On: 09/18/2019 19:12    EKG: Independently reviewed. Sinus rhythm, nonspecific T wave changes inferior leads.  Not significantly changed when compared to prior.  Assessment/Plan Principal Problem:   Stroke [facility]) Active Problems:   Controlled type 2 diabetes mellitus without complication, without long-term current use of insulin ([place])   Hypertension associated with diabetes ([place])   Hyperlipidemia associated with type 2 diabetes mellitus ([place])  Micheal Oliver is a 69 y.o. male with medical history significant for type 2 diabetes, hypertension, and hyperlipidemia who is admitted with an acute posterior left MCA territory infarct.  4 cm acute posterior left MCA territory infarct Subacute bilateral occipital and cerebellar infarcts: Seen on MRI brain.  Still with intermittent expressive aphasia.  Has been on aspirin 81 mg as an  outpatient. -Neurology to see, further recommendations appreciated -Continue aspirin 81 mg daily, add Plavix 75 mg daily -Increase atorvastatin to 40 mg -Obtain echocardiogram and carotid Dopplers -Continue telemetry neurochecks -PT/OT/SLP eval -Allow permissive hypertension for now -Check A1c, lipid panel  Hypertension: Hold home lisinopril to allow for permissive hypertension.  Type 2 diabetes: Holding Metformin, start on sensitive SSI while in hospital.  Hyperlipidemia: Increase atorvastatin 40 mg daily as above.  DVT prophylaxis: Lovenox Code Status: Full code, confirmed with patient Family Communication: Discussed with patient, he has discussed with family Disposition Plan: From home and likely discharge to home pending further stroke work-up and PT/OT eval Consults called: Neurology Admission status:  Status is: Inpatient  Remains inpatient appropriate because:Ongoing diagnostic testing needed not appropriate for outpatient work up  Dispo: The patient is from: Home              Anticipated d/c is to: Home              Anticipated d/c date is: 2 days pending further stroke work-up, neurology evaluation, and PT/OT/SLP eval.              Patient currently is not medically stable to d/c.  Zada Finders MD [facility]  If 7PM-7AM, please contact night-coverage [email or web address]  09/18/2019, 9:47 PM

## 2019-09-18 NOTE — ED Triage Notes (Signed)
Pt has had headache for 3 days.  Pt states today at 1030 he noticed he was having problems with vision and expressive aphasia.

## 2019-09-19 ENCOUNTER — Inpatient Hospital Stay (HOSPITAL_COMMUNITY): Payer: Medicare HMO

## 2019-09-19 DIAGNOSIS — I10 Essential (primary) hypertension: Secondary | ICD-10-CM

## 2019-09-19 DIAGNOSIS — E785 Hyperlipidemia, unspecified: Secondary | ICD-10-CM

## 2019-09-19 DIAGNOSIS — E119 Type 2 diabetes mellitus without complications: Secondary | ICD-10-CM

## 2019-09-19 DIAGNOSIS — E1169 Type 2 diabetes mellitus with other specified complication: Secondary | ICD-10-CM

## 2019-09-19 DIAGNOSIS — I340 Nonrheumatic mitral (valve) insufficiency: Secondary | ICD-10-CM

## 2019-09-19 DIAGNOSIS — E1159 Type 2 diabetes mellitus with other circulatory complications: Secondary | ICD-10-CM

## 2019-09-19 LAB — LIPID PANEL
Cholesterol: 106 mg/dL (ref 0–200)
HDL: 27 mg/dL — ABNORMAL LOW (ref >40)
LDL Cholesterol: 42 mg/dL (ref 0–99)
Total CHOL/HDL Ratio: 3.9 RATIO
Triglycerides: 184 mg/dL — ABNORMAL HIGH (ref <150)
VLDL: 37 mg/dL (ref 0–40)

## 2019-09-19 LAB — HEMOGLOBIN A1C
Hgb A1c MFr Bld: 7.3 % — ABNORMAL HIGH (ref 4.8–5.6)
Mean Plasma Glucose: 162.81 mg/dL

## 2019-09-19 LAB — CK: Total CK: 66 U/L (ref 49–397)

## 2019-09-19 LAB — ECHOCARDIOGRAM COMPLETE
Height: 74 in
Weight: 3439.18 oz

## 2019-09-19 LAB — HIV ANTIBODY (ROUTINE TESTING W REFLEX): HIV Screen 4th Generation wRfx: NONREACTIVE

## 2019-09-19 MED ORDER — IOHEXOL 350 MG/ML SOLN
75.0000 mL | Freq: Once | INTRAVENOUS | Status: AC | PRN
  Administered 2019-09-19: 75 mL via INTRAVENOUS

## 2019-09-19 NOTE — Progress Notes (Signed)
Occupational Therapy Evaluation Patient Details Name: Micheal Oliver MRN: [id number] DOB: 03/17/1951 Today's Date: 09/19/2019    History of Present Illness Pt 68 y.o. male admitted on 09/18/19 for expressive aphasia and HA from left MCA. CT/MRI 09/18/19 posterior left MCA CVA, small subacute bilateral occipital and cerebellar infarcts. PMH mumps, measles, kidney stones, chicken pox.   Clinical Impression   Patient lives at home with family and is independent at baseline.  Today patient's primary deficit is speech.  He has trouble with word finding.  Patient lacks safety awareness, moving impulsively and insisting he should be able to go home, though this is baseline for him per family.  Explained to patient importance of waiting until doctor's think it is safe to return home.  Educated patient on taking time and moving slower.  Displayed no new vision changes (reports some mild R field cut from prior).  UB strength symmetrical and WFL. Patient supervision with ADLs due to safety awareness, though physically able to complete with independence.  OT will sign off on patient at this time.      Follow Up Recommendations  No OT follow up    Equipment Recommendations  None recommended by OT    Recommendations for Other Services       Precautions / Restrictions Precautions Precautions: None Restrictions Weight Bearing Restrictions: No      Mobility Bed Mobility Overal bed mobility: Independent                Transfers Overall transfer level: Independent                    Balance Overall balance assessment: No apparent balance deficits (not formally assessed)                                         ADL either performed or assessed with clinical judgement   ADL Overall ADL's : Needs assistance/impaired                                       General ADL Comments: Patient supervision with ADLs at this time due to lack of safety  awareness, though physically no difficulty.  Does move quickly      Vision         Perception     Praxis      Pertinent Vitals/Pain Pain Assessment: Faces Pain Score: 4  (reports "I have been telling everyone 3 or 4/10) Faces Pain Scale: Hurts a little bit Pain Location: headache Pain Descriptors / Indicators: Headache;Constant     Hand Dominance Right   Extremity/Trunk Assessment Upper Extremity Assessment Upper Extremity Assessment: Overall WFL for tasks assessed   Lower Extremity Assessment Lower Extremity Assessment: Overall WFL for tasks assessed   Cervical / Trunk Assessment Cervical / Trunk Assessment: Normal   Communication Communication Communication: Expressive difficulties   Cognition Arousal/Alertness: Awake/alert Behavior During Therapy: WFL for tasks assessed/performed;Impulsive Overall Cognitive Status: Impaired/Different from baseline Area of Impairment: Safety/judgement                         Safety/Judgement: Decreased awareness of safety;Decreased awareness of deficits     General Comments: Patient moves impulsively and quickly.  Wants to leave hospial and does not understand why he has to be  there   General Comments  VSS, no abnormal skin integument observed    Exercises     Shoulder Instructions      Home Living Family/patient expects to be discharged to:: Private residence Living Arrangements: Spouse/significant other Available Help at Discharge: Family Type of Home: House Home Access: Stairs to enter Technical brewer of Steps: 3 Entrance Stairs-Rails: Can reach both Home Layout: Two level Alternate Level Stairs-Number of Steps: Flight   Bathroom Shower/Tub: Tub/shower unit;Walk-in shower   Bathroom Toilet: Standard Bathroom Accessibility: Yes   Home Equipment: None          Prior Functioning/Environment Level of Independence: Independent                 OT Problem List: Decreased safety  awareness      OT Treatment/Interventions: Cognitive remediation/compensation    OT Goals(Current goals can be found in the care plan section) Acute Rehab OT Goals Patient Stated Goal: to go home OT Goal Formulation: With patient Time For Goal Achievement: 10/03/19 Potential to Achieve Goals: Good  OT Frequency: Min 2X/week   Barriers to D/C:            Co-evaluation              AM-PAC OT "6 Clicks" Daily Activity     Outcome Measure Help from another person eating meals?: None Help from another person taking care of personal grooming?: None Help from another person toileting, which includes using toliet, bedpan, or urinal?: None Help from another person bathing (including washing, rinsing, drying)?: None Help from another person to put on and taking off regular upper body clothing?: None Help from another person to put on and taking off regular lower body clothing?: None 6 Click Score: 24   End of Session Nurse Communication: Mobility status  Activity Tolerance: Patient tolerated treatment well Patient left: in bed;with call bell/phone within reach;with family/visitor present  OT Visit Diagnosis: Cognitive communication deficit (R41.841) Symptoms and signs involving cognitive functions: Cerebral infarction                Time: 1400-1415 OT Time Calculation (min): 15 min Charges:  OT General Charges $OT Visit: 1 Visit OT Evaluation $OT Eval Moderate Complexity: 1 [address] OTR/L   Phylliss Bob 09/19/2019, 3:13 PM

## 2019-09-19 NOTE — Evaluation (Signed)
Physical Therapy Evaluation & Discharge Patient Details Name: Micheal Oliver MRN: [id number] DOB: [birth year] Today's Date: 09/19/2019   History of Present Illness  Pt 69 y.o. male admitted on 09/18/19 for expressive aphasia and HA from left MCA. CT/MRI 09/18/19 posterior left MCA CVA, small subacute bilateral occipital and cerebellar infarcts. PMH mumps, measles, kidney stones, chicken pox.  Clinical Impression  Pt presents with no decrease in functional mobility secondary to above. PTA, pt retired and lives with wife. Today, pt able to complete all mobility independently with cues to slow down movement intermittently throughout the session. Pt is independent from functional mobility stand point and can d/c from physical therapy acutely.    Follow Up Recommendations No PT follow up    Equipment Recommendations  None recommended by PT    Recommendations for Other Services       Precautions / Restrictions Precautions Precautions: None      Mobility  Bed Mobility Overal bed mobility: Independent                Transfers Overall transfer level: Independent                  Ambulation/Gait Ambulation/Gait assistance: Independent Gait Distance (Feet): 550 Feet Assistive device: None Gait Pattern/deviations: WFL(Within Functional Limits)     General Gait Details: pt able to amb without assistance or LOB  Stairs Stairs: Yes Stairs assistance: Independent Stair Management: One rail Right;One rail Left Number of Stairs: 12 General stair comments: Pt negotiated 1 flight of stairs  Wheelchair Mobility    Modified Rankin (Stroke Patients Only) Modified Rankin (Stroke Patients Only) Pre-Morbid Rankin Score: No symptoms Modified Rankin: No significant disability (reports headache that has remained consistent since he arrived to ED)     Balance Overall balance assessment: No apparent balance deficits (not formally assessed)                                            Pertinent Vitals/Pain Pain Assessment: 0-10 Pain Score: 4  (reports "I have been telling everyone 3 or 4/10) Pain Location: headache Pain Descriptors / Indicators: Headache;Constant    Home Living Family/patient expects to be discharged to:: Private residence Living Arrangements: Spouse/significant other Available Help at Discharge: Family Type of Home: House Home Access: Stairs to enter Entrance Stairs-Rails: Can reach both Entrance Stairs-Number of Steps: 3 Home Layout: Two level Home Equipment: None      Prior Function Level of Independence: Independent               Hand Dominance        Extremity/Trunk Assessment   Upper Extremity Assessment Upper Extremity Assessment: Overall WFL for tasks assessed    Lower Extremity Assessment Lower Extremity Assessment: Overall WFL for tasks assessed    Cervical / Trunk Assessment Cervical / Trunk Assessment: Normal  Communication   Communication: Expressive difficulties  Cognition Arousal/Alertness: Awake/alert Behavior During Therapy: WFL for tasks assessed/performed Overall Cognitive Status: Impaired/Different from baseline Area of Impairment: Following commands;Safety/judgement;Problem solving                         Safety/Judgement: Decreased awareness of safety;Decreased awareness of deficits     General Comments: Pt reports he is able to speak, but requires someone to ask him questions in order for him to speak. Pt appears quick to complete tasks  with impulsive behavior. Pt able to redirect when asked. Pt verbally agressive with family in room, reports that he is frusterated with not being able to eat, unable to ask family baseline, consulted nursing who indicated this was pt baseline cognition pre stroke      General Comments General comments (skin integrity, edema, etc.): VSS, no abnormal skin integument observed    Exercises     Assessment/Plan    PT Assessment  Patent does not need any further PT services  PT Problem List         PT Treatment Interventions      PT Goals (Current goals can be found in the Care Plan section)  Acute Rehab PT Goals PT Goal Formulation: All assessment and education complete, DC therapy    Frequency     Barriers to discharge        Co-evaluation               AM-PAC PT "6 Clicks" Mobility  Outcome Measure Help needed turning from your back to your side while in a flat bed without using bedrails?: None Help needed moving from lying on your back to sitting on the side of a flat bed without using bedrails?: None Help needed moving to and from a bed to a chair (including a wheelchair)?: None Help needed standing up from a chair using your arms (e.g., wheelchair or bedside chair)?: None Help needed to walk in hospital room?: None Help needed climbing 3-5 steps with a railing? : None 6 Click Score: 24    End of Session   Activity Tolerance: Patient tolerated treatment well Patient left: in bed;with call bell/phone within reach;with family/visitor present Nurse Communication: Mobility status      Time: 1040-1048 PT Time Calculation (min) (ACUTE ONLY): 8 min   Charges:   PT Evaluation $PT Eval Low Complexity: 1 Low          Micheal Oliver SPT 09/19/2019   Micheal Oliver 09/19/2019, 12:54 PM

## 2019-09-19 NOTE — Progress Notes (Signed)
PROGRESS NOTE    Micheal Oliver  ZOX:[id number] DOB: 10-May-1950 DOA: 09/18/2019 PCP: Darreld Mclean, MD   Brief Narrative:  Micheal Oliver is a 69 y.o. male with medical history significant for type 2 diabetes, hypertension, and hyperlipidemia who presents to the ED for evaluation of expressive aphasia.  Patient states that over the last 6-8 months he has felt as if he has been having trouble taking care of his own wellbeing.  He has had a mild headache over the last 3 days.  Earlier today (09/18/2019), he developed new onset expressive aphasia.  He says he was able to hear and comprehend what others were saying but he had difficulty speaking the words he wanted to say.  He also had difficulty remembering close friends names.  He has had very mild lightheadedness without syncope, fall, or difficulty with ambulation.  He denies any chest pain, dyspnea, cough, nausea, vomiting, abdominal pain, weakness in his arms or legs, or change in sensation. He says he is currently taking aspirin 81 mg every morning.  He takes atorvastatin 10 mg daily.  He denies any tobacco use.  He reports rare social alcohol use.  He denies any illicit drug use. In ED initial vitals showed BP 142/86, pulse 72, RR 15, temp 98.5 Fahrenheit, SPO2 95% on room air. Labs notable for sodium 139, potassium 4.0, bicarb 24, BUN 23, creatinine 1.24, LFTs within normal limits, serum glucose 191, WBC 10.0, hemoglobin 14.5, platelets 197,000.  SARS-CoV-2 PCR is negative. CT head without contrast shows small to moderate size area of cortical and white matter low-attenuation within the parieto-occipital region on the left consistent with subacute infarct. MRI brain with and without contrast shows a 4 cm acute posterior left MCA territory infarct, small subacute bilateral occipital and cerebellar infarcts, and mild chronic small vessel ischemic disease. Patient was given IV Reglan, IV morphine.  Neurology were consulted and will see.  The  hospitalist service was consulted to admit for further evaluation management.   Assessment & Plan:   Principal Problem:   Stroke [facility]) Active Problems:   Controlled type 2 diabetes mellitus without complication, without long-term current use of insulin ([place])   Hypertension associated with diabetes ([place])   Hyperlipidemia associated with type 2 diabetes mellitus ([place])   Acute posterior left MCA territory infarct Subacute bilateral occipital and cerebellar infarcts: - Per MRI -expressive aphasia appears to be resolving  -Neurology following, appreciate insight and recommendations - Continue aspirin, Plavix, statin  - Echocardiogram, carotid Dopplers, CTA head/neck pending - PT/OT/SLP eval - Possible lung mass per radiology, repeat imaging as below, would certainly place patient at a higher risk for clotting disorder given no other obvious risk factors at this time. Lab Results  Component Value Date   HGBA1C 7.3 (H) 09/19/2019  Lipid Panel     Component Value Date/Time   CHOL 106 09/19/2019 0431   TRIG 184 (H) 09/19/2019 0431   HDL 27 (L) 09/19/2019 0431   CHOLHDL 3.9 09/19/2019 0431   VLDL 37 09/19/2019 0431   LDLCALC 42 09/19/2019 0431   LDLDIRECT 66.0 01/10/2016 1110   Incidental lung mass - Incidentally noted on CTA of the head and neck, repeat CT chest for dedicated view - We will discuss with pulmonology and oncology for possible biopsy while in-house  Thyroid nodule - Incidentally noted thyroid nodule, unclear if related to lung mass as above - Repeat imaging pending further work-up as above  Hypertension: - Hold home lisinopril to allow for permissive hypertension.  Type 2 diabetes: - Holding Metformin, start on sensitive SSI while in hospital.  Hyperlipidemia: Increase atorvastatin 40 mg daily as above.  DVT prophylaxis: Lovenox Code Status: Full code, confirmed with patient Family Communication: Discussed with patient/family at bedside Status is:  Inpatient Dispo: The patient is from: Home              Anticipated d/c is to: Home              Anticipated d/c date is: 24 to 48 hours              Patient currently not medically stable for discharge given need for ongoing work-up and evaluation of acute stroke as well as newly discovered lung mass and thyroid nodule.  Consultants:   Neurology  Subjective: No acute issues or events overnight, patient's expressive aphasia appears to be resolving but he still has some word finding difficulty on occasion but much less frequently than before.  Wife and son at bedside indicate the same.  Patient otherwise denies chest pain, shortness of breath, nausea, vomiting, diarrhea, constipation, headache, fevers, chills.  Objective: Vitals:   09/18/19 2345 09/19/19 0145 09/19/19 0345 09/19/19 0545  BP: (!) 158/99 (!) 150/97 (!) 155/93 (!) 149/91  Pulse: 65 72 66 70  Resp: 18 18 18 18   Temp: 97.8 F (36.6 C) 97.9 F (36.6 C) 97.8 F (36.6 C) 97.8 F (36.6 C)  TempSrc: Oral Oral Oral Oral  SpO2: 98% 97% 97% 98%  Weight: 97.5 kg     Height: 6\' 2"  (1.88 m)      No intake or output data in the 24 hours ending 09/19/19 0800 Filed Weights   09/18/19 2345  Weight: 97.5 kg    Examination:  General exam: Appears calm and comfortable  Respiratory system: Clear to auscultation. Respiratory effort normal. Cardiovascular system: S1 & S2 heard, RRR. No JVD, murmurs, rubs, gallops or clicks. No pedal edema. Gastrointestinal system: Abdomen is nondistended, soft and nontender. No organomegaly or masses felt. Normal bowel sounds heard. Central nervous system: Alert and oriented. No focal neurological deficits. Extremities: Symmetric 5 x 5 power. Skin: No rashes, lesions or ulcers Psychiatry: Judgement and insight appear normal. Mood & affect appropriate.     Data Reviewed: I have personally reviewed following labs and imaging studies  CBC: Recent Labs  Lab 09/18/19 1436  WBC 10.0  NEUTROABS  6.2  HGB 14.5  HCT 43.6  MCV 91.6  PLT 628   Basic Metabolic Panel: Recent Labs  Lab 09/18/19 1436  NA 139  K 4.0  CL 104  CO2 24  GLUCOSE 191*  BUN 23  CREATININE 1.24  CALCIUM 9.3   GFR: Estimated Creatinine Clearance: 66.3 mL/min (by C-G formula based on SCr of 1.24 mg/dL). Liver Function Tests: Recent Labs  Lab 09/18/19 1436  AST 28  ALT 25  ALKPHOS 72  BILITOT 1.4*  PROT 7.8  ALBUMIN 4.4   No results for input(s): LIPASE, AMYLASE in the last 168 hours. No results for input(s): AMMONIA in the last 168 hours. Coagulation Profile: Recent Labs  Lab 09/18/19 1436  INR 1.1   Cardiac Enzymes: No results for input(s): CKTOTAL, CKMB, CKMBINDEX, TROPONINI in the last 168 hours. BNP (last 3 results) No results for input(s): PROBNP in the last 8760 hours. HbA1C: Recent Labs    09/19/19 0431  HGBA1C 7.3*   CBG: Recent Labs  Lab 09/18/19 1436  GLUCAP 178*   Lipid Profile: Recent Labs    09/19/19  0431  CHOL 106  HDL 27*  LDLCALC 42  TRIG 184*  CHOLHDL 3.9   Thyroid Function Tests: No results for input(s): TSH, T4TOTAL, FREET4, T3FREE, THYROIDAB in the last 72 hours. Anemia Panel: No results for input(s): VITAMINB12, FOLATE, FERRITIN, TIBC, IRON, RETICCTPCT in the last 72 hours. Sepsis Labs: No results for input(s): PROCALCITON, LATICACIDVEN in the last 168 hours.  Recent Results (from the past 240 hour(s))  SARS Coronavirus 2 by RT PCR (hospital order, performed in [facility] hospital lab) Nasopharyngeal Nasopharyngeal Swab     Status: None   Collection Time: 09/18/19  3:40 PM   Specimen: Nasopharyngeal Swab  Result Value Ref Range Status   SARS Coronavirus 2 NEGATIVE NEGATIVE Final    Comment: (NOTE) SARS-CoV-2 target nucleic acids are NOT DETECTED.  The SARS-CoV-2 RNA is generally detectable in upper and lower respiratory specimens during the acute phase of infection. The lowest concentration of SARS-CoV-2 viral copies this assay can detect  is 250 copies / mL. A negative result does not preclude SARS-CoV-2 infection and should not be used as the sole basis for treatment or other patient management decisions.  A negative result may occur with improper specimen collection / handling, submission of specimen other than nasopharyngeal swab, presence of viral mutation(s) within the areas targeted by this assay, and inadequate number of viral copies (<250 copies / mL). A negative result must be combined with clinical observations, patient history, and epidemiological information.  Fact Sheet for Patients:   StrictlyIdeas.no  Fact Sheet for Healthcare Providers: BankingDealers.co.za  This test is not yet approved or  cleared by the [place] FDA and has been authorized for detection and/or diagnosis of SARS-CoV-2 by FDA under an Emergency Use Authorization (EUA).  This EUA will remain in effect (meaning this test can be used) for the duration of the COVID-19 declaration under Section 564(b)(1) of the Act, 21 U.S.C. section 360bbb-3(b)(1), unless the authorization is terminated or revoked sooner.  Performed at [facility], [address] [place], [place] [address]          Radiology Studies: CT HEAD WO CONTRAST  Result Date: 09/18/2019 CLINICAL DATA:  Altered mental status. EXAM: CT HEAD WITHOUT CONTRAST TECHNIQUE: Contiguous axial images were obtained from the base of the skull through the vertex without intravenous contrast. COMPARISON:  None. FINDINGS: Brain: There is mild cerebral atrophy with widening of the extra-axial spaces and ventricular dilatation. There are areas of decreased attenuation within the white matter tracts of the supratentorial brain, consistent with microvascular disease changes. A small to moderate sized area of cortical and white matter low attenuation is seen within the parietooccipital region on the left (axial CT images 14 through 19,  CT series number 2). An area of isodense cortex is seen within the periphery of this region (axial CT image 16, CT series number 2). Very mild sulcal effacement is noted. There is no evidence of associated midline shift. Vascular: No hyperdense vessel or unexpected calcification. Skull: Normal. Negative for fracture or focal lesion. Sinuses/Orbits: No acute finding. Other: None. IMPRESSION: 1. Small to moderate-sized area of cortical and white matter low attenuation within the parietooccipital region on the left, with a small area of isodense cortex seen within the periphery of this region. This is consistent with a subacute infarct. MRI correlation is recommended. 2. No evidence of associated midline shift. Electronically Signed   By: Virgina Norfolk M.D.   On: 09/18/2019 15:20   MR BRAIN W WO CONTRAST  Result Date: 09/18/2019 CLINICAL DATA:  Headache for 3 days. Aphasia in visual disturbance today. EXAM: MRI HEAD WITHOUT AND WITH CONTRAST TECHNIQUE: Multiplanar, multiecho pulse sequences of the brain and surrounding structures were obtained without and with intravenous contrast. CONTRAST:  72mL GADAVIST GADOBUTROL 1 MMOL/ML IV SOLN COMPARISON:  Head CT 09/18/2019 FINDINGS: Brain: There is a 4 cm acute posterior left MCA territory infarct near the junction of the temporal, occipital, and parietal lobes. Immediately posterior to this in the lateral aspect of the left occipital lobe is a 2.5 cm region of signal abnormality most compatible with a subacute infarct with a small amount of petechial blood products. There are also subcentimeter foci of mild trace diffusion weighted signal hyperintensity, normal ADC, and mild T2 hyperintensity in the right occipital lobe and both cerebellar hemispheres likely reflecting subacute infarcts. There is associated enhancement of the infarcts in both occipital lobes and right cerebellum. T2 hyperintensities elsewhere in the cerebral white matter bilaterally are nonspecific  but compatible with mild chronic small vessel ischemic disease. The ventricles are normal in size. No mass, midline shift, or extra-axial fluid collection is identified. Vascular: Major intracranial vascular flow voids are preserved. Skull and upper cervical spine: Unremarkable bone marrow signal. Sinuses/Orbits: Unremarkable orbits. Clear paranasal sinuses. Small right mastoid effusion. Other: None. IMPRESSION: 1. 4 cm acute posterior left MCA territory infarct. 2. Small subacute bilateral occipital and cerebellar infarcts. 3. Mild chronic small vessel ischemic disease. Electronically Signed   By: Logan Bores M.D.   On: 09/18/2019 19:12   Scheduled Meds:  aspirin EC  81 mg Oral Daily   atorvastatin  40 mg Oral q1800   clopidogrel  75 mg Oral Daily   enoxaparin (LOVENOX) injection  40 mg Subcutaneous Q24H   insulin aspart  0-9 Units Subcutaneous TID WC   Continuous Infusions:   LOS: 1 day   Time spent: 51min  Tedford C Bellina Tokarczyk, DO [facility]  If 7PM-7AM, please contact night-coverage [email or web address]  09/19/2019, 8:00 AM

## 2019-09-19 NOTE — Progress Notes (Signed)
STROKE TEAM PROGRESS NOTE   HISTORY OF PRESENT ILLNESS (per record) Micheal Oliver is an 69 y.o. male with a PMHx of DM and kidney stones who presented to the ED today with a c/c of a progressive left temporal headache x 3 days, followed by acute onset of expressive aphasia and vision problems at 10:00 AM. Imaging studies revealed acute and subacute ischemic infarctions bilaterally, suggestive of a cardioembolic etiology. The patient has no prior history of stroke. He takes ASA daily at home.  CT head: 1. Small to moderate-sized area of cortical and white matter low attenuation within the parietooccipital region on the left, with a small area of isodense cortex seen within the periphery of this region. This is consistent with a subacute infarct. MRI correlation is recommended. 2. No evidence of associated midline shift. MRI brain: 1. 4 cm acute posterior left MCA territory infarct. 2. Small subacute bilateral occipital and cerebellar infarcts. 3. Mild chronic small vessel ischemic disease EKG: Sinus rhythm RSR' in V1 or V2, right VCD or RVH Borderline T abnormalities, inferior leads LSN: 1030 tPA Given: No: Out of the tPA time window   INTERVAL HISTORY The patient's daughter and wife are at the bedside.  He complains of having a ongoing persistent moderate headache on the left side.  Analgesics given has been beneficial.  He also continues to have some mild word finding difficulties.    OBJECTIVE Vitals:   09/19/19 0345 09/19/19 0545 09/19/19 0824 09/19/19 1140  BP: (!) 155/93 (!) 149/91 (!) 148/97 140/90  Pulse: 66 70 72 80  Resp: 18 18 20 17   Temp: 97.8 F (36.6 C) 97.8 F (36.6 C) 98.3 F (36.8 C) 98.9 F (37.2 C)  TempSrc: Oral Oral Oral Oral  SpO2: 97% 98% 97% 97%  Weight:      Height:        CBC:  Recent Labs  Lab 09/18/19 1436  WBC 10.0  NEUTROABS 6.2  HGB 14.5  HCT 43.6  MCV 91.6  PLT 778    Basic Metabolic Panel:  Recent Labs  Lab 09/18/19 1436   NA 139  K 4.0  CL 104  CO2 24  GLUCOSE 191*  BUN 23  CREATININE 1.24  CALCIUM 9.3    Lipid Panel:     Component Value Date/Time   CHOL 106 09/19/2019 0431   TRIG 184 (H) 09/19/2019 0431   HDL 27 (L) 09/19/2019 0431   CHOLHDL 3.9 09/19/2019 0431   VLDL 37 09/19/2019 0431   LDLCALC 42 09/19/2019 0431   HgbA1c:  Lab Results  Component Value Date   HGBA1C 7.3 (H) 09/19/2019   Urine Drug Screen: No results found for: LABOPIA, COCAINSCRNUR, LABBENZ, AMPHETMU, THCU, LABBARB  Alcohol Level No results found for: [facility]  IMAGING  CT ANGIO HEAD W OR WO CONTRAST CT ANGIO NECK W OR WO CONTRAST 09/19/2019 IMPRESSION:  1. No significant proximal stenosis, aneurysm, or branch vessel occlusion within the Circle of Willis.  2. Luxury perfusion in the area of the left parietal infarct.  3. Right hilar and suprahilar mass lesion measuring at least 5.3 x 4.0 x 4.8 cm. This is concerning for a primary lung neoplasm. Recommend CT of the chest with contrast for further evaluation.  4. Additional hilar adenopathy is concerning for metastatic disease.  5. 1.9 cm hypo dense nodule in the left lobe of the thyroid. Recommend thyroid US (ref: J Am Coll Radiol. 2015 Feb;12(2): 143-50).   CT HEAD WO CONTRAST 09/18/2019 IMPRESSION:  1. Small  to moderate-sized area of cortical and white matter low attenuation within the parietooccipital region on the left, with a small area of isodense cortex seen within the periphery of this region. This is consistent with a subacute infarct. MRI correlation is recommended.  2. No evidence of associated midline shift.   MR BRAIN W WO CONTRAST 09/18/2019 IMPRESSION:  1. 4 cm acute posterior left MCA territory infarct.  2. Small subacute bilateral occipital and cerebellar infarcts.  3. Mild chronic small vessel ischemic disease.   ECHOCARDIOGRAM COMPLETE 09/19/2019 IMPRESSIONS   1. Left ventricular ejection fraction, by estimation, is 60 to 65%. The left ventricle  has normal function. The left ventricle has no regional wall motion abnormalities. There is moderate concentric left ventricular hypertrophy. Left ventricular diastolic parameters are consistent with Grade I diastolic dysfunction (impaired relaxation).   2. Right ventricular systolic function is normal. The right ventricular size is normal.   3. Left atrial size was mildly dilated.   4. The mitral valve is normal in structure. Mild mitral valve regurgitation. No evidence of mitral stenosis.   5. The aortic valve is normal in structure. Aortic valve regurgitation is trivial. No aortic stenosis is present.   6. The inferior vena cava is normal in size with greater than 50% respiratory variability, suggesting right atrial pressure of 3 mmHg.   ECG - SR rate 87 BPM. (See cardiology reading for complete details)  PHYSICAL EXAM Blood pressure 140/90, pulse 80, temperature 98.9 F (37.2 C), temperature source Oral, resp. rate 17, height 6\' 2"  (1.88 m), weight 97.5 kg, SpO2 97 %. GENERAL: He is doing well at this time.  HEENT: Neck is supple no trauma noted.  ABDOMEN: soft  EXTREMITIES: No edema   BACK: Normal  SKIN: Normal by inspection.    MENTAL STATUS: Alert and oriented. Speech and cognition are generally intact.  He has subtle word finding difficulties.  He is able to name 4/5 at the bedside.  He did substitute words a couple of times.  Judgment and insight normal.   CRANIAL NERVES: Pupils are equal, round and reactive to light and accomodation; extra ocular movements are full, there is no significant nystagmus; visual fields are full; upper and lower facial muscles are normal in strength and symmetric, there is no flattening of the nasolabial folds; tongue is midline; uvula is midline; shoulder elevation is normal.  There is a dense right homonymous hemianopia.  MOTOR: Normal tone, bulk and strength; no pronator drift.  COORDINATION: Left finger to nose is normal, right finger to nose is  normal, No rest tremor; no intention tremor; no postural tremor; no bradykinesia.        ASSESSMENT/PLAN [name] is a 69 y.o. male with history of DM and kidney stones who presented to the ED today with a c/c of a progressive left temporal headache x 3 days, followed by acute onset of expressive aphasia and vision problems at 10:00 AM. He did not receive IV t-PA due to late presentation (>4.5 hours from time of onset).  Stroke: multiple embolic infarcts from an unknown source  Resultant mild word finding difficulties/mild expressive aphasia and a dense right homonymous hemianopia.  Code Stroke CT Head - not ordered  CT head - Small to moderate-sized area of cortical and white matter low attenuation within the parietooccipital region on the left, with a small area of isodense cortex seen within the periphery of this region. This is consistent with a subacute infarct. MRI correlation is recommended. No evidence  of associated midline shift.   MRI head - 4 cm acute posterior left MCA territory infarct. Small subacute bilateral occipital and cerebellar infarcts.   MRA head - not ordered  CTA H&N - No significant proximal stenosis, aneurysm, or branch vessel occlusion within the Circle of Willis. Luxury perfusion in the area of the left parietal infarct.   CT Perfusion - not ordered  Carotid Doppler - CTA neck performed - carotid dopplers not indicated.  2D Echo - EF 60 - 65%. No cardiac source of emboli identified.  Sars Corona Virus 2 - negative  LDL - 42  HgbA1c - 7.3  UDS - not ordered  VTE prophylaxis - Lovenox Diet  Diet Order            Diet Heart Room service appropriate? Yes; Fluid consistency: Thin  Diet effective now                  aspirin 81 mg daily prior to admission, now on aspirin 81 mg daily and clopidogrel 75 mg daily  Patient counseled to be compliant with his antithrombotic medications  Ongoing aggressive stroke risk factor  management  Therapy recommendations: No f/u therapy recommended  Disposition:  Pending  Hypertension  Home BP meds: Zestril 10 mg daily  Current BP meds: none  Stable . Permissive hypertension (OK if < 220/120) but gradually normalize in 5-7 days  . Long-term BP goal normotensive  Hyperlipidemia  Home Lipid lowering medication: Lipitor 10 mg daily  LDL 42, goal < 70  Current lipid lowering medication: Lipitor 40 mg daily   Continue statin at discharge  Diabetes  Home diabetic meds: metformin  Current diabetic meds: insulin  HgbA1c 7.3, goal < 7.0 Recent Labs    09/18/19 1436  GLUCAP 178*     Other Stroke Risk Factors  Advanced age  Overweight, Body mass index is 27.6 kg/m., recommend weight loss, diet and exercise as appropriate   Family hx stroke (grandmother)   Other Active Problems  Code status - Full Code  Right hilar and suprahilar mass lesion measuring at least 5.3 x 4.0 x 4.8 cm. This is concerning for a primary lung neoplasm. Recommend CT of the chest with contrast for further evaluation.   Additional hilar adenopathy is concerning for metastatic disease.   1.9 cm hypo dense nodule in the left lobe of the thyroid. Recommend thyroid US   Hospital day # 1  These seem to be a case of cardioembolic stroke with bitemporal infarcts.  The patient is vigorously being worked up for cardioembolic source including atrial fibrillation.  The possible malignancy related to the hilar mass may be the etiology however.  To contact Stroke Continuity provider, please refer to [email or web address] After hours, contact General Neurology

## 2019-09-19 NOTE — Plan of Care (Signed)
  Problem: Coping: Goal: Will verbalize positive feelings about self Outcome: Progressing   Problem: Self-Care: Goal: Ability to participate in self-care as condition permits will improve Outcome: Progressing   Problem: Nutrition: Goal: Risk of aspiration will decrease Outcome: Progressing   Problem: Clinical Measurements: Goal: Ability to maintain clinical measurements within normal limits will improve Outcome: Progressing Goal: Will remain free from infection Outcome: Progressing

## 2019-09-19 NOTE — Progress Notes (Signed)
  Echocardiogram 2D Echocardiogram has been performed.  Micheal Oliver 09/19/2019, 9:08 AM

## 2019-09-20 ENCOUNTER — Inpatient Hospital Stay (HOSPITAL_COMMUNITY): Payer: Medicare HMO

## 2019-09-20 LAB — BASIC METABOLIC PANEL
Anion gap: 9 (ref 5–15)
BUN: 17 mg/dL (ref 8–23)
CO2: 24 mmol/L (ref 22–32)
Calcium: 9 mg/dL (ref 8.9–10.3)
Chloride: 106 mmol/L (ref 98–111)
Creatinine, Ser: 1.16 mg/dL (ref 0.61–1.24)
GFR calc Af Amer: >60 mL/min (ref >60)
GFR calc non Af Amer: >60 mL/min (ref >60)
Glucose, Bld: 183 mg/dL — ABNORMAL HIGH (ref 70–99)
Potassium: 4.1 mmol/L (ref 3.5–5.1)
Sodium: 139 mmol/L (ref 135–145)

## 2019-09-20 LAB — CBC
HCT: 38.1 % — ABNORMAL LOW (ref 39.0–52.0)
Hemoglobin: 12.9 g/dL — ABNORMAL LOW (ref 13.0–17.0)
MCH: 30.4 pg (ref 26.0–34.0)
MCHC: 33.9 g/dL (ref 30.0–36.0)
MCV: 89.6 fL (ref 80.0–100.0)
Platelets: 190 10*3/uL (ref 150–400)
RBC: 4.25 MIL/uL (ref 4.22–5.81)
RDW: 12.8 % (ref 11.5–15.5)
WBC: 8 10*3/uL (ref 4.0–10.5)
nRBC: 0 % (ref 0.0–0.2)

## 2019-09-20 MED ORDER — ATORVASTATIN CALCIUM 40 MG PO TABS: 40.0000 mg | ORAL_TABLET | Freq: Every day | ORAL | 0 refills | Status: DC

## 2019-09-20 MED ORDER — CLOPIDOGREL BISULFATE 75 MG PO TABS: 75.0000 mg | ORAL_TABLET | Freq: Every day | ORAL | 0 refills | Status: DC

## 2019-09-20 NOTE — Plan of Care (Signed)
  Problem: Coping: Goal: Will verbalize positive feelings about self Outcome: Progressing   Problem: Nutrition: Goal: Risk of aspiration will decrease Outcome: Progressing   Problem: Clinical Measurements: Goal: Respiratory complications will improve Outcome: Progressing   Problem: Activity: Goal: Risk for activity intolerance will decrease Outcome: Progressing   Problem: Nutrition: Goal: Adequate nutrition will be maintained Outcome: Progressing

## 2019-09-20 NOTE — Plan of Care (Cosign Needed)
Per Dr Avon Gully have sent Trish, Cardiology "cardmaster", a message to schedule OV with EP to discuss outpatient placement of an implantable loop recorder to rule out atrial fibrillation as etiology of recent embolic stroke. Newly diagnosed malignancy also puts pt at higher risk for hypercoagulable emboli.  Mikey Bussing PA-C [facility] Pager [phone] 09/20/2019, 12:21 PM

## 2019-09-20 NOTE — Discharge Summary (Signed)
Physician Discharge Summary  Atilano Covelli VKP:[id number] DOB: [birth year] DOA: 09/18/2019  PCP: Darreld Mclean, MD  Admit date: 09/18/2019 Discharge date: 09/20/2019  Admitted From: Home Disposition: Home  Recommendations for Outpatient Follow-up:  1. Follow up with PCP in 1-2 weeks, pulmonology within the next 3 to 5 days 2. Please obtain BMP/CBC in one week  Discharge Condition: Stable CODE STATUS: Full Diet recommendation: As tolerated  Brief/Interim Summary: Gwyndolyn Saxon Dentleris a 69 y.o.malewith medical history significant fortype 2 diabetes, hypertension, and hyperlipidemia who presents to the ED for evaluation ofexpressive aphasia. Patient states that over the last 6-8 months he has felt as if he has been having trouble taking care of his own wellbeing. He has had a mild headache over the last 3 days. Earlier today (09/18/2019), he developed new onset expressive aphasia. He says he was able to hear and comprehend what others were saying but he had difficulty speaking the words he wanted to say. He also had difficulty remembering close friends names. He has had very mild lightheadedness without syncope, fall, or difficulty with ambulation. He denies any chest pain, dyspnea, cough, nausea, vomiting, abdominal pain,weakness in his arms or legs, or change in sensation. He says he is currently taking aspirin 81 mg every morning. He takes atorvastatin 10 mg daily. He denies any tobacco use. He reports rare social alcohol use. He denies any illicit drug use. In ED initial vitals showed BP 142/86, pulse 72, RR 15, temp 98.5 Fahrenheit, SPO2 95% on room air. Labs notable for sodium 139, potassium 4.0, bicarb 24, BUN 23, creatinine 1.24, LFTs within normal limits, serum glucose 191, WBC 10.0, hemoglobin 14.5, platelets 197,000. SARS-CoV-2 PCR is negative. CT head without contrast shows small to moderate size area of cortical and white matter low-attenuation within the  parieto-occipital region on the left consistent with subacute infarct. MRI brain with and without contrast shows a 4 cm acute posterior left MCA territory infarct, small subacute bilateral occipital and cerebellar infarcts, and mild chronic small vessel ischemic disease. Patient was given IV Reglan, IV morphine. Neurology were consulted and will see. The hospitalist service was consulted to admit for further evaluation management.  Patient met as above with acute word finding difficulty noted to have what appears to be both acute infarct as well as subacute infarcts concerning for embolic disease.  CT and MRI as below showing multiple areas of stroke, telemetry did not reveal any dysrhythmias, vascular evaluation with echocardiogram was unremarkable as was CTA of the head and neck.  Unfortunately on CT of the neck there was notable nodule on thyroid at the left lobe as well as hilar mass, dedicated CT chest confirmed 6.1 cm suprahilar mass with associated adenopathy.  Pulmonology was sidelined, will follow up outpatient tomorrow with PCP per previous schedule and pulmonology will set up for outpatient bronchoscopy for further investigation into newly discovered lung mass.  Patient is otherwise asymptomatic from lung mass, patient's expressive aphasia appears to have improved drastically.  Following for patient's multiple strokes and will continue patient on core measures including aspirin, Plavix, statin; patient will also continue closely with PCP for hypertension control, diabetes control and ongoing dietary restrictions per recommendations.  Discharge Diagnoses:  Principal Problem:   Stroke [facility]) Active Problems:   Controlled type 2 diabetes mellitus without complication, without long-term current use of insulin ([place])   Hypertension associated with diabetes ([place])   Hyperlipidemia associated with type 2 diabetes mellitus [facility])    Discharge Instructions  Discharge Instructions  Call MD for:   difficulty breathing, headache or visual disturbances   Complete by: As directed    Call MD for:  extreme fatigue   Complete by: As directed    Call MD for:  hives   Complete by: As directed    Call MD for:  persistant dizziness or light-headedness   Complete by: As directed    Call MD for:  persistant nausea and vomiting   Complete by: As directed    Call MD for:  severe uncontrolled pain   Complete by: As directed    Call MD for:  temperature >100.4   Complete by: As directed    Diet - low sodium heart healthy   Complete by: As directed    Increase activity slowly   Complete by: As directed      Allergies as of 09/20/2019      Reactions   Bee Venom    Lip swelling      Medication List    STOP taking these medications   tamsulosin 0.4 MG Caps capsule Commonly known as: Flomax     TAKE these medications   aspirin 81 MG tablet Take 81 mg by mouth daily.   atorvastatin 40 MG tablet Commonly known as: LIPITOR Take 1 tablet (40 mg total) by mouth daily at 6 PM. What changed:   medication strength  how much to take   clopidogrel 75 MG tablet Commonly known as: PLAVIX Take 1 tablet (75 mg total) by mouth daily. Start taking on: September 21, 2019   Fish Oil 1200 MG Caps Take by mouth daily.   gabapentin 300 MG capsule Commonly known as: NEURONTIN Take 1 capsule (300 mg total) by mouth at bedtime.   lisinopril 10 MG tablet Commonly known as: ZESTRIL Take 1 tablet (10 mg total) by mouth daily.   metFORMIN 500 MG tablet Commonly known as: GLUCOPHAGE Take 2 tablets (1,000 mg total) by mouth 2 (two) times daily with a meal.       Follow-up Information    Rigoberto Noel, MD Follow up.   Specialty: Pulmonary Disease Why: Office Will Call with an appointment.  Contact information: [address] [place] [address] [phone]              Allergies  Allergen Reactions  . Bee Venom     Lip swelling     Consultations:  Neurology  Procedures/Studies: CT ANGIO HEAD W OR WO CONTRAST  Result Date: 09/19/2019 CLINICAL DATA:  Headache. Aphasia. Visual disturbance. Abnormal MRI. Acute nonhemorrhagic MCA territory infarct. EXAM: CT ANGIOGRAPHY HEAD AND NECK TECHNIQUE: Multidetector CT imaging of the head and neck was performed using the standard protocol during bolus administration of intravenous contrast. Multiplanar CT image reconstructions and MIPs were obtained to evaluate the vascular anatomy. Carotid stenosis measurements (when applicable) are obtained utilizing NASCET criteria, using the distal internal carotid diameter as the denominator. CONTRAST:  42m OMNIPAQUE IOHEXOL 350 MG/ML SOLN COMPARISON:  MR head without and with contrast 09/18/2019. CT head without contrast 09/18/2019. FINDINGS: CTA NECK FINDINGS Aortic arch: 3 vessel arch configuration is present. No significant vascular calcifications are present. No aneurysm or stenosis is present. Right carotid system: The right common carotid artery is within normal limits. Bifurcation is unremarkable. Mild tortuosity is present in the cervical right ICA without significant stenosis. Left carotid system: The left common carotid artery is within normal limits. Bifurcation is unremarkable. Mild tortuosity is present in the cervical left ICA without significant  stenosis. Focal hypoattenuation in the mid cervical left ICA is artifactual. Vertebral arteries: The vertebral arteries are codominant. Both vertebral arteries originate from the subclavian arteries without significant stenosis. No significant stenosis is present in either vertebral artery in the neck. Skeleton: Vertebral body heights and alignment are maintained. No focal lytic or blastic lesions are present. Other neck: A 1.9 cm hypo dense nodule is present in the left lobe of the thyroid. Salivary glands are within normal limits. No significant cervical adenopathy is present. Subcentimeter level  2 lymph nodes are present bilaterally. Upper chest: Right hilar and suprahilar mass lesion measures at least 5.3 x 4.0 x 4.8 cm. Precarinal lymph node measures 13 x 14 mm. Additional hilar adenopathy is present. No obstructive disease is evident in the imaged portions of the right lung. Left lung is clear. Thoracic inlet is normal. Review of the MIP images confirms the above findings CTA HEAD FINDINGS Anterior circulation: The internal carotid arteries are within normal limits from the skull base through the ICA termini. The A1 and M1 segments are normal. The anterior communicating artery is patent. MCA bifurcations are intact bilaterally. MCA branch vessels are within normal limits bilaterally. ACA branch vessels are unremarkable. No significant proximal stenosis or occlusion is present. No aneurysm is present. Posterior circulation: The vertebral arteries are codominant. PICA origins are visualized and normal. Vertebral artery is normal. Both posterior cerebral arteries originate from the basilar tip. The PCA branch vessels are within normal limits. Luxury perfusion is noted in the area of the left parietal infarct. Venous sinuses: The dural sinuses are patent. The straight sinus deep cerebral veins are intact. The right transverse sinus is dominant. Cortical veins are unremarkable. No vascular malformation is evident. Anatomic variants: None Review of the MIP images confirms the above findings IMPRESSION: 1. No significant proximal stenosis, aneurysm, or branch vessel occlusion within the Circle of Willis. 2. Luxury perfusion in the area of the left parietal infarct. 3. Right hilar and suprahilar mass lesion measuring at least 5.3 x 4.0 x 4.8 cm. This is concerning for a primary lung neoplasm. Recommend CT of the chest with contrast for further evaluation. 4. Additional hilar adenopathy is concerning for metastatic disease. 5. 1.9 cm hypo dense nodule in the left lobe of the thyroid. Recommend thyroid US (ref: J  Am Coll Radiol. 2015 Feb;12(2): 143-50). These results were called by telephone at the time of interpretation on 09/19/2019 at 1:38 pm to provider Avon Gully, who verbally acknowledged these results. Electronically Signed   By: San Morelle M.D.   On: 09/19/2019 13:38   CT HEAD WO CONTRAST  Result Date: 09/18/2019 CLINICAL DATA:  Altered mental status. EXAM: CT HEAD WITHOUT CONTRAST TECHNIQUE: Contiguous axial images were obtained from the base of the skull through the vertex without intravenous contrast. COMPARISON:  None. FINDINGS: Brain: There is mild cerebral atrophy with widening of the extra-axial spaces and ventricular dilatation. There are areas of decreased attenuation within the white matter tracts of the supratentorial brain, consistent with microvascular disease changes. A small to moderate sized area of cortical and white matter low attenuation is seen within the parietooccipital region on the left (axial CT images 14 through 19, CT series number 2). An area of isodense cortex is seen within the periphery of this region (axial CT image 16, CT series number 2). Very mild sulcal effacement is noted. There is no evidence of associated midline shift. Vascular: No hyperdense vessel or unexpected calcification. Skull: Normal. Negative for fracture or focal lesion.  Sinuses/Orbits: No acute finding. Other: None. IMPRESSION: 1. Small to moderate-sized area of cortical and white matter low attenuation within the parietooccipital region on the left, with a small area of isodense cortex seen within the periphery of this region. This is consistent with a subacute infarct. MRI correlation is recommended. 2. No evidence of associated midline shift. Electronically Signed   By: Virgina Norfolk M.D.   On: 09/18/2019 15:20   CT ANGIO NECK W OR WO CONTRAST  Result Date: 09/19/2019 CLINICAL DATA:  Headache. Aphasia. Visual disturbance. Abnormal MRI. Acute nonhemorrhagic MCA territory infarct. EXAM: CT  ANGIOGRAPHY HEAD AND NECK TECHNIQUE: Multidetector CT imaging of the head and neck was performed using the standard protocol during bolus administration of intravenous contrast. Multiplanar CT image reconstructions and MIPs were obtained to evaluate the vascular anatomy. Carotid stenosis measurements (when applicable) are obtained utilizing NASCET criteria, using the distal internal carotid diameter as the denominator. CONTRAST:  12m OMNIPAQUE IOHEXOL 350 MG/ML SOLN COMPARISON:  MR head without and with contrast 09/18/2019. CT head without contrast 09/18/2019. FINDINGS: CTA NECK FINDINGS Aortic arch: 3 vessel arch configuration is present. No significant vascular calcifications are present. No aneurysm or stenosis is present. Right carotid system: The right common carotid artery is within normal limits. Bifurcation is unremarkable. Mild tortuosity is present in the cervical right ICA without significant stenosis. Left carotid system: The left common carotid artery is within normal limits. Bifurcation is unremarkable. Mild tortuosity is present in the cervical left ICA without significant stenosis. Focal hypoattenuation in the mid cervical left ICA is artifactual. Vertebral arteries: The vertebral arteries are codominant. Both vertebral arteries originate from the subclavian arteries without significant stenosis. No significant stenosis is present in either vertebral artery in the neck. Skeleton: Vertebral body heights and alignment are maintained. No focal lytic or blastic lesions are present. Other neck: A 1.9 cm hypo dense nodule is present in the left lobe of the thyroid. Salivary glands are within normal limits. No significant cervical adenopathy is present. Subcentimeter level 2 lymph nodes are present bilaterally. Upper chest: Right hilar and suprahilar mass lesion measures at least 5.3 x 4.0 x 4.8 cm. Precarinal lymph node measures 13 x 14 mm. Additional hilar adenopathy is present. No obstructive disease is  evident in the imaged portions of the right lung. Left lung is clear. Thoracic inlet is normal. Review of the MIP images confirms the above findings CTA HEAD FINDINGS Anterior circulation: The internal carotid arteries are within normal limits from the skull base through the ICA termini. The A1 and M1 segments are normal. The anterior communicating artery is patent. MCA bifurcations are intact bilaterally. MCA branch vessels are within normal limits bilaterally. ACA branch vessels are unremarkable. No significant proximal stenosis or occlusion is present. No aneurysm is present. Posterior circulation: The vertebral arteries are codominant. PICA origins are visualized and normal. Vertebral artery is normal. Both posterior cerebral arteries originate from the basilar tip. The PCA branch vessels are within normal limits. Luxury perfusion is noted in the area of the left parietal infarct. Venous sinuses: The dural sinuses are patent. The straight sinus deep cerebral veins are intact. The right transverse sinus is dominant. Cortical veins are unremarkable. No vascular malformation is evident. Anatomic variants: None Review of the MIP images confirms the above findings IMPRESSION: 1. No significant proximal stenosis, aneurysm, or branch vessel occlusion within the Circle of Willis. 2. Luxury perfusion in the area of the left parietal infarct. 3. Right hilar and suprahilar mass lesion measuring  at least 5.3 x 4.0 x 4.8 cm. This is concerning for a primary lung neoplasm. Recommend CT of the chest with contrast for further evaluation. 4. Additional hilar adenopathy is concerning for metastatic disease. 5. 1.9 cm hypo dense nodule in the left lobe of the thyroid. Recommend thyroid US (ref: J Am Coll Radiol. 2015 Feb;12(2): 143-50). These results were called by telephone at the time of interpretation on 09/19/2019 at 1:38 pm to provider Avon Gully, who verbally acknowledged these results. Electronically Signed   By: San Morelle M.D.   On: 09/19/2019 13:38   CT CHEST WO CONTRAST  Result Date: 09/19/2019 CLINICAL DATA:  Lung nodule. EXAM: CT CHEST WITHOUT CONTRAST TECHNIQUE: Multidetector CT imaging of the chest was performed following the standard protocol without IV contrast. COMPARISON:  CT urogram 11/24/2018 FINDINGS: Cardiovascular: Heart is normal size. Thoracic aorta is unremarkable without evidence of aneurysm. Remaining vascular structures are unremarkable. Mediastinum/Nodes: 1.1 cm precarinal lymph node just right of midline. Subcarinal lymph node measuring 1.6 cm by short axis. Anterior superior right hilar adenopathy. Remaining mediastinal structures are unremarkable. Lungs/Pleura: There is a mass over the paramediastinal region of the right upper lobe with irregular lateral border as this mass measures 4.2 x 6.1 x 5.2 cm in transverse, AP and craniocaudal dimensions. This mass extends to the right suprahilar region likely contiguous with adjacent hilar adenopathy. This mass obstructs the associated bronchus over the right upper lobe as there is mild volume loss of the right upper lobe. 3 mm nodule over the right upper lobe lateral to this mass. Multiple other subcentimeter nodules within the right lung with the largest measuring approximately 4-5 mm over the right lower lobe. Calcified granuloma over the left lower lobe. Several other smaller subcentimeter noncalcified left lung nodules with the largest measuring 5-6 mm over the lateral left lower lobe. No evidence of effusion. Upper Abdomen: Diverticulosis of the colon. Contrast present over the right renal collecting system from patient's recent CT a head and neck. Musculoskeletal: Minimal degenerate change of the spine. IMPRESSION: 1. 6.1 cm right upper lobe mass extending to the right suprahilar region with associated right hilar and mediastinal adenopathy. This mass obstructs the associated upper lobe bronchus with volume loss of the right upper lobe. Findings  are highly suspicious for primary bronchogenic carcinoma. Multiple subcentimeter bilateral pulmonary nodules as described which may be due to metastatic disease. Recommend referral to [facility]). 2.  Diverticulosis of the colon. Electronically Signed   By: Marin Olp M.D.   On: 09/19/2019 16:40   MR BRAIN W WO CONTRAST  Result Date: 09/18/2019 CLINICAL DATA:  Headache for 3 days. Aphasia in visual disturbance today. EXAM: MRI HEAD WITHOUT AND WITH CONTRAST TECHNIQUE: Multiplanar, multiecho pulse sequences of the brain and surrounding structures were obtained without and with intravenous contrast. CONTRAST:  105m GADAVIST GADOBUTROL 1 MMOL/ML IV SOLN COMPARISON:  Head CT 09/18/2019 FINDINGS: Brain: There is a 4 cm acute posterior left MCA territory infarct near the junction of the temporal, occipital, and parietal lobes. Immediately posterior to this in the lateral aspect of the left occipital lobe is a 2.5 cm region of signal abnormality most compatible with a subacute infarct with a small amount of petechial blood products. There are also subcentimeter foci of mild trace diffusion weighted signal hyperintensity, normal ADC, and mild T2 hyperintensity in the right occipital lobe and both cerebellar hemispheres likely reflecting subacute infarcts. There is associated enhancement of the infarcts in both occipital lobes and  right cerebellum. T2 hyperintensities elsewhere in the cerebral white matter bilaterally are nonspecific but compatible with mild chronic small vessel ischemic disease. The ventricles are normal in size. No mass, midline shift, or extra-axial fluid collection is identified. Vascular: Major intracranial vascular flow voids are preserved. Skull and upper cervical spine: Unremarkable bone marrow signal. Sinuses/Orbits: Unremarkable orbits. Clear paranasal sinuses. Small right mastoid effusion. Other: None. IMPRESSION: 1. 4 cm acute posterior left MCA  territory infarct. 2. Small subacute bilateral occipital and cerebellar infarcts. 3. Mild chronic small vessel ischemic disease. Electronically Signed   By: Logan Bores M.D.   On: 09/18/2019 19:12   ECHOCARDIOGRAM COMPLETE  Result Date: 09/19/2019    ECHOCARDIOGRAM REPORT   Patient Name:   Micheal Oliver Date of Exam: 09/19/2019 Medical Rec #:  [id number]       Height:       74.0 in Accession #:    [id number]      Weight:       214.9 lb Date of Birth:  06-21-1950       BSA:          2.241 m Patient Age:    43 years        BP:           149/91 mmHg Patient Gender: M               HR:           61 bpm. Exam Location:  Inpatient Procedure: 2D Echo Indications:    stroke 434.91  History:        Patient has no prior history of Echocardiogram examinations.                 Risk Factors:Hypertension, Dyslipidemia, Diabetes and Family                 History of Coronary Artery Disease.  Sonographer:    Johny Chess Referring Phys: [id number] [place]  1. Left ventricular ejection fraction, by estimation, is 60 to 65%. The left ventricle has normal function. The left ventricle has no regional wall motion abnormalities. There is moderate concentric left ventricular hypertrophy. Left ventricular diastolic parameters are consistent with Grade I diastolic dysfunction (impaired relaxation).  2. Right ventricular systolic function is normal. The right ventricular size is normal.  3. Left atrial size was mildly dilated.  4. The mitral valve is normal in structure. Mild mitral valve regurgitation. No evidence of mitral stenosis.  5. The aortic valve is normal in structure. Aortic valve regurgitation is trivial. No aortic stenosis is present.  6. The inferior vena cava is normal in size with greater than 50% respiratory variability, suggesting right atrial pressure of 3 mmHg. Conclusion(s)/Recommendation(s): No intracardiac source of embolism detected on this transthoracic study. A transesophageal  echocardiogram is recommended to exclude cardiac source of embolism if clinically indicated. FINDINGS  Left Ventricle: Left ventricular ejection fraction, by estimation, is 60 to 65%. The left ventricle has normal function. The left ventricle has no regional wall motion abnormalities. The left ventricular internal cavity size was normal in size. There is  moderate concentric left ventricular hypertrophy. Left ventricular diastolic parameters are consistent with Grade I diastolic dysfunction (impaired relaxation). Normal left ventricular filling pressure. Right Ventricle: The right ventricular size is normal. No increase in right ventricular wall thickness. Right ventricular systolic function is normal. Left Atrium: Left atrial size was mildly dilated. Right Atrium: Right atrial size was normal in size. Pericardium: There is  no evidence of pericardial effusion. Mitral Valve: The mitral valve is normal in structure. Normal mobility of the mitral valve leaflets. Mild mitral valve regurgitation. No evidence of mitral valve stenosis. Tricuspid Valve: The tricuspid valve is normal in structure. Tricuspid valve regurgitation is trivial. No evidence of tricuspid stenosis. Aortic Valve: The aortic valve is normal in structure. Aortic valve regurgitation is trivial. No aortic stenosis is present. Pulmonic Valve: The pulmonic valve was normal in structure. Pulmonic valve regurgitation is trivial. No evidence of pulmonic stenosis. Aorta: The aortic root is normal in size and structure. Venous: The inferior vena cava is normal in size with greater than 50% respiratory variability, suggesting right atrial pressure of 3 mmHg. IAS/Shunts: No atrial level shunt detected by color flow Doppler.  LEFT VENTRICLE PLAX 2D LVIDd:         5.20 cm  Diastology LVIDs:         3.30 cm  LV e' lateral:   12.40 cm/s LV PW:         1.20 cm  LV E/e' lateral: 4.5 LV IVS:        1.30 cm  LV e' medial:    6.09 cm/s LVOT diam:     2.30 cm  LV E/e'  medial:  9.1 LV SV:         68 LV SV Index:   30 LVOT Area:     4.15 cm  RIGHT VENTRICLE RV S prime:     12.60 cm/s TAPSE (M-mode): 2.1 cm LEFT ATRIUM             Index       RIGHT ATRIUM           Index LA diam:        4.40 cm 1.96 cm/m  RA Area:     17.60 cm LA Vol (A2C):   52.3 ml 23.34 ml/m RA Volume:   46.20 ml  20.62 ml/m LA Vol (A4C):   40.3 ml 17.98 ml/m LA Biplane Vol: 48.4 ml 21.60 ml/m  AORTIC VALVE LVOT Vmax:   90.10 cm/s LVOT Vmean:  55.300 cm/s LVOT VTI:    0.163 m  AORTA Ao Root diam: 4.00 cm Ao Asc diam:  3.70 cm MITRAL VALVE MV Area (PHT): 2.22 cm    SHUNTS MV Decel Time: 342 msec    Systemic VTI:  0.16 m MV E velocity: 55.70 cm/s  Systemic Diam: 2.30 cm MV A velocity: 53.10 cm/s MV E/A ratio:  1.05 Ena Dawley MD Electronically signed by Ena Dawley MD Signature Date/Time: 09/19/2019/1:17:21 PM    Final      Subjective: No acute issues or events overnight, patient feels quite well, denies any further speech impediment, denies chest pain, shortness of breath, nausea, vomiting, diarrhea, constipation, headache, fevers, chills.   Discharge Exam: Vitals:   09/20/19 0400 09/20/19 0805  BP: (!) 153/93 (!) 155/94  Pulse: 61 68  Resp: 18 18  Temp: 97.9 F (36.6 C) 99.2 F (37.3 C)  SpO2: 95% 98%   Vitals:   09/19/19 1140 09/19/19 1718 09/20/19 0400 09/20/19 0805  BP: 140/90 (!) 151/96 (!) 153/93 (!) 155/94  Pulse: 80 93 61 68  Resp: _0 Temp: 98.9 F (37.2 C) 98.7 F (37.1 C) 97.9 F (36.6 C) 99.2 F (37.3 C)  TempSrc: Oral Oral Oral Oral  SpO2: 97% 98% 95% 98%  Weight:      Height:        General: Pt is  alert, awake, not in acute distress Cardiovascular: RRR, S1/S2 +, no rubs, no gallops Respiratory: CTA bilaterally, no wheezing, no rhonchi Abdominal: Soft, NT, ND, bowel sounds + Extremities: no edema, no cyanosis    The results of significant diagnostics from this hospitalization (including imaging, microbiology, ancillary and laboratory)  are listed below for reference.     Microbiology: Recent Results (from the past 240 hour(s))  SARS Coronavirus 2 by RT PCR (hospital order, performed in [facility] hospital lab) Nasopharyngeal Nasopharyngeal Swab     Status: None   Collection Time: 09/18/19  3:40 PM   Specimen: Nasopharyngeal Swab  Result Value Ref Range Status   SARS Coronavirus 2 NEGATIVE NEGATIVE Final    Comment: (NOTE) SARS-CoV-2 target nucleic acids are NOT DETECTED.  The SARS-CoV-2 RNA is generally detectable in upper and lower respiratory specimens during the acute phase of infection. The lowest concentration of SARS-CoV-2 viral copies this assay can detect is 250 copies / mL. A negative result does not preclude SARS-CoV-2 infection and should not be used as the sole basis for treatment or other patient management decisions.  A negative result may occur with improper specimen collection / handling, submission of specimen other than nasopharyngeal swab, presence of viral mutation(s) within the areas targeted by this assay, and inadequate number of viral copies (<250 copies / mL). A negative result must be combined with clinical observations, patient history, and epidemiological information.  Fact Sheet for Patients:   StrictlyIdeas.no  Fact Sheet for Healthcare Providers: BankingDealers.co.za  This test is not yet approved or  cleared by the [place] FDA and has been authorized for detection and/or diagnosis of SARS-CoV-2 by FDA under an Emergency Use Authorization (EUA).  This EUA will remain in effect (meaning this test can be used) for the duration of the COVID-19 declaration under Section 564(b)(1) of the Act, 21 U.S.C. section 360bbb-3(b)(1), unless the authorization is terminated or revoked sooner.  Performed at [facility], [address] [place], [place] [address]      Labs: BNP (last 3 results) No results for input(s): BNP  in the last 8760 hours. Basic Metabolic Panel: Recent Labs  Lab 09/18/19 1436 09/20/19 0439  NA 139 139  K 4.0 4.1  CL 104 106  CO2 24 24  GLUCOSE 191* 183*  BUN 23 17  CREATININE 1.24 1.16  CALCIUM 9.3 9.0   Liver Function Tests: Recent Labs  Lab 09/18/19 1436  AST 28  ALT 25  ALKPHOS 72  BILITOT 1.4*  PROT 7.8  ALBUMIN 4.4   No results for input(s): LIPASE, AMYLASE in the last 168 hours. No results for input(s): AMMONIA in the last 168 hours. CBC: Recent Labs  Lab 09/18/19 1436 09/20/19 0439  WBC 10.0 8.0  NEUTROABS 6.2  --   HGB 14.5 12.9*  HCT 43.6 38.1*  MCV 91.6 89.6  PLT 197 190   Cardiac Enzymes: Recent Labs  Lab 09/19/19 1123  CKTOTAL 66   BNP: Invalid input(s): POCBNP CBG: Recent Labs  Lab 09/18/19 1436  GLUCAP 178*   D-Dimer No results for input(s): DDIMER in the last 72 hours. Hgb A1c Recent Labs    09/19/19 0431  HGBA1C 7.3*   Lipid Profile Recent Labs    09/19/19 0431  CHOL 106  HDL 27*  LDLCALC 42  TRIG 184*  CHOLHDL 3.9   Thyroid function studies No results for input(s): TSH, T4TOTAL, T3FREE, THYROIDAB in the last 72 hours.  Invalid input(s): FREET3 Anemia work  up No results for input(s): VITAMINB12, FOLATE, FERRITIN, TIBC, IRON, RETICCTPCT in the last 72 hours. Urinalysis    Component Value Date/Time   COLORURINE BROWN (A) 11/24/2018 1601   APPEARANCEUR TURBID (A) 11/24/2018 1601   LABSPEC >1.030 (H) 11/24/2018 1601   PHURINE 5.5 11/24/2018 1601   GLUCOSEU NEGATIVE 11/24/2018 1601   GLUCOSEU NEGATIVE 07/11/2015 0835   HGBUR LARGE (A) 11/24/2018 1601   BILIRUBINUR SMALL (A) 11/24/2018 1601   KETONESUR 15 (A) 11/24/2018 1601   PROTEINUR 100 (A) 11/24/2018 1601   UROBILINOGEN 0.2 07/11/2015 0835   NITRITE NEGATIVE 11/24/2018 1601   LEUKOCYTESUR NEGATIVE 11/24/2018 1601   Sepsis Labs Invalid input(s): PROCALCITONIN,  WBC,  LACTICIDVEN Microbiology Recent Results (from the past 240 hour(s))  SARS  Coronavirus 2 by RT PCR (hospital order, performed in [facility] hospital lab) Nasopharyngeal Nasopharyngeal Swab     Status: None   Collection Time: 09/18/19  3:40 PM   Specimen: Nasopharyngeal Swab  Result Value Ref Range Status   SARS Coronavirus 2 NEGATIVE NEGATIVE Final    Comment: (NOTE) SARS-CoV-2 target nucleic acids are NOT DETECTED.  The SARS-CoV-2 RNA is generally detectable in upper and lower respiratory specimens during the acute phase of infection. The lowest concentration of SARS-CoV-2 viral copies this assay can detect is 250 copies / mL. A negative result does not preclude SARS-CoV-2 infection and should not be used as the sole basis for treatment or other patient management decisions.  A negative result may occur with improper specimen collection / handling, submission of specimen other than nasopharyngeal swab, presence of viral mutation(s) within the areas targeted by this assay, and inadequate number of viral copies (<250 copies / mL). A negative result must be combined with clinical observations, patient history, and epidemiological information.  Fact Sheet for Patients:   StrictlyIdeas.no  Fact Sheet for Healthcare Providers: BankingDealers.co.za  This test is not yet approved or  cleared by the [place] FDA and has been authorized for detection and/or diagnosis of SARS-CoV-2 by FDA under an Emergency Use Authorization (EUA).  This EUA will remain in effect (meaning this test can be used) for the duration of the COVID-19 declaration under Section 564(b)(1) of the Act, 21 U.S.C. section 360bbb-3(b)(1), unless the authorization is terminated or revoked sooner.  Performed at [facility], [address] [place], [place] [address]      Time coordinating discharge: Over 30 minutes  SIGNED:   Little Ishikawa, DO [facility] 09/20/2019, 11:27 AM Pager   If 7PM-7AM, please  contact night-coverage [email or web address]

## 2019-09-20 NOTE — Progress Notes (Signed)
   The patient was seen at the bedside with his family.  He continues to have mild aphasia.  GENERAL: He is doing well at this time.  HEENT: Neck is supple no trauma noted.  EXTREMITIES: No edema   BACK: Normal  SKIN: Normal by inspection.    MENTAL STATUS: Alert and oriented. Speech and cognition are generally intact.  He has subtle word finding difficulties.  He is able to name 4/5 at the bedside.  He did substitute words a couple of times.  Judgment and insight normal.   CRANIAL NERVES: Pupils are equal, round and reactive to light and accomodation; extra ocular movements are full, there is no significant nystagmus; visual fields are full; upper and lower facial muscles are normal in strength and symmetric, there is no flattening of the nasolabial folds; tongue is midline; uvula is midline; shoulder elevation is normal.  There is a dense right homonymous hemianopia.  MOTOR: Normal tone, bulk and strength; no pronator drift.    Discussed need for further evaluation for bihemispheric infarct.  Loop recorder is suggested and they are agreeable to this.

## 2019-09-20 NOTE — Progress Notes (Signed)
PIV removed without difficult. Discharge instructions reviewed with patient and wife. All questions answered. Both patient and wife verbalized understanding instructions. All personal belongings  Collected from the room by patient's wife. Patient discharged via wheelchair to personal vehicle driven by wife.

## 2019-09-21 ENCOUNTER — Other Ambulatory Visit: Payer: Self-pay

## 2019-09-21 ENCOUNTER — Ambulatory Visit (INDEPENDENT_AMBULATORY_CARE_PROVIDER_SITE_OTHER): Payer: Medicare HMO | Admitting: Family Medicine

## 2019-09-21 ENCOUNTER — Encounter: Payer: Self-pay | Admitting: Family Medicine

## 2019-09-21 VITALS — BP 142/84 | HR 83 | Temp 97.2°F | Resp 18 | Ht 74.0 in | Wt 207.5 lb

## 2019-09-21 DIAGNOSIS — E119 Type 2 diabetes mellitus without complications: Secondary | ICD-10-CM | POA: Diagnosis not present

## 2019-09-21 DIAGNOSIS — Z125 Encounter for screening for malignant neoplasm of prostate: Secondary | ICD-10-CM | POA: Diagnosis not present

## 2019-09-21 DIAGNOSIS — G245 Blepharospasm: Secondary | ICD-10-CM | POA: Diagnosis not present

## 2019-09-21 DIAGNOSIS — E079 Disorder of thyroid, unspecified: Secondary | ICD-10-CM | POA: Diagnosis not present

## 2019-09-21 DIAGNOSIS — R918 Other nonspecific abnormal finding of lung field: Secondary | ICD-10-CM

## 2019-09-21 DIAGNOSIS — E785 Hyperlipidemia, unspecified: Secondary | ICD-10-CM

## 2019-09-21 DIAGNOSIS — Z09 Encounter for follow-up examination after completed treatment for conditions other than malignant neoplasm: Secondary | ICD-10-CM | POA: Diagnosis not present

## 2019-09-21 DIAGNOSIS — I10 Essential (primary) hypertension: Secondary | ICD-10-CM

## 2019-09-21 DIAGNOSIS — Z8673 Personal history of transient ischemic attack (TIA), and cerebral infarction without residual deficits: Secondary | ICD-10-CM | POA: Diagnosis not present

## 2019-09-21 MED ORDER — ATORVASTATIN CALCIUM 40 MG PO TABS: 40.0000 mg | ORAL_TABLET | Freq: Every day | ORAL | 3 refills | Status: DC

## 2019-09-21 MED ORDER — METFORMIN HCL 500 MG PO TABS: 1000.0000 mg | ORAL_TABLET | Freq: Two times a day (BID) | ORAL | 3 refills | Status: DC

## 2019-09-21 MED ORDER — LISINOPRIL 10 MG PO TABS: 10.0000 mg | ORAL_TABLET | Freq: Every day | ORAL | 3 refills | Status: DC

## 2019-09-21 MED ORDER — GABAPENTIN 300 MG PO CAPS: 300.0000 mg | ORAL_CAPSULE | Freq: Every day | ORAL | 3 refills | Status: DC

## 2019-09-21 MED ORDER — CLOPIDOGREL BISULFATE 75 MG PO TABS: 75.0000 mg | ORAL_TABLET | Freq: Every day | ORAL | 3 refills | Status: DC

## 2019-09-22 ENCOUNTER — Encounter: Payer: Self-pay | Admitting: Family Medicine

## 2019-09-24 ENCOUNTER — Other Ambulatory Visit: Payer: Self-pay

## 2019-09-24 ENCOUNTER — Telehealth: Payer: Self-pay | Admitting: Emergency Medicine

## 2019-09-24 ENCOUNTER — Ambulatory Visit: Payer: Medicare HMO | Admitting: Emergency Medicine

## 2019-09-24 ENCOUNTER — Other Ambulatory Visit (INDEPENDENT_AMBULATORY_CARE_PROVIDER_SITE_OTHER): Payer: Medicare HMO

## 2019-09-24 ENCOUNTER — Encounter: Payer: Self-pay | Admitting: Emergency Medicine

## 2019-09-24 ENCOUNTER — Telehealth: Payer: Self-pay | Admitting: *Deleted

## 2019-09-24 VITALS — BP 142/92 | HR 79 | Temp 98.0°F | Ht 74.0 in | Wt 211.6 lb

## 2019-09-24 DIAGNOSIS — R918 Other nonspecific abnormal finding of lung field: Secondary | ICD-10-CM | POA: Diagnosis not present

## 2019-09-24 LAB — COMPREHENSIVE METABOLIC PANEL
ALT: 22 U/L (ref 0–53)
AST: 17 U/L (ref 0–37)
Albumin: 4.7 g/dL (ref 3.5–5.2)
Alkaline Phosphatase: 86 U/L (ref 39–117)
BUN: 17 mg/dL (ref 6–23)
CO2: 24 mEq/L (ref 19–32)
Calcium: 9.9 mg/dL (ref 8.4–10.5)
Chloride: 105 mEq/L (ref 96–112)
Creatinine, Ser: 1.17 mg/dL (ref 0.40–1.50)
GFR: 61.93 mL/min (ref >60.00)
Glucose, Bld: 145 mg/dL — ABNORMAL HIGH (ref 70–99)
Potassium: 4.7 mEq/L (ref 3.5–5.1)
Sodium: 137 mEq/L (ref 135–145)
Total Bilirubin: 1.1 mg/dL (ref 0.2–1.2)
Total Protein: 7.6 g/dL (ref 6.0–8.3)

## 2019-09-24 LAB — CBC WITH DIFFERENTIAL/PLATELET
Basophils Absolute: 0.1 10*3/uL (ref 0.0–0.1)
Basophils Relative: 1 % (ref 0.0–3.0)
Eosinophils Absolute: 0.3 10*3/uL (ref 0.0–0.7)
Eosinophils Relative: 2.9 % (ref 0.0–5.0)
HCT: 41.6 % (ref 39.0–52.0)
Hemoglobin: 14.2 g/dL (ref 13.0–17.0)
Lymphocytes Relative: 24 % (ref 12.0–46.0)
Lymphs Abs: 2.5 10*3/uL (ref 0.7–4.0)
MCHC: 34.1 g/dL (ref 30.0–36.0)
MCV: 90.5 fl (ref 78.0–100.0)
Monocytes Absolute: 0.8 10*3/uL (ref 0.1–1.0)
Monocytes Relative: 7.2 % (ref 3.0–12.0)
Neutro Abs: 6.9 10*3/uL (ref 1.4–7.7)
Neutrophils Relative %: 64.9 % (ref 43.0–77.0)
Platelets: 218 10*3/uL (ref 150.0–400.0)
RBC: 4.59 Mil/uL (ref 4.22–5.81)
RDW: 13.8 % (ref 11.5–15.5)
WBC: 10.6 10*3/uL — ABNORMAL HIGH (ref 4.0–10.5)

## 2019-09-24 LAB — PROTIME-INR
INR: 1.2 ratio — ABNORMAL HIGH (ref 0.8–1.0)
Prothrombin Time: 12.9 s (ref 9.6–13.1)

## 2019-09-24 LAB — APTT: aPTT: 28.2 s (ref 23.4–32.7)

## 2019-09-24 NOTE — Telephone Encounter (Signed)
-----   Message from Ilona Sorrel sent at 09/24/2019 10:39 AM EDT ----- Regarding: ENB/EBUS Pt has been scheduled for 6/30 at 1:00 at [place].  CT was done on 6/19 at [facility] and I have requested disk that will be there for Dr Lamonte Sakai to pick up.   Judeen Hammans

## 2019-09-24 NOTE — Progress Notes (Signed)
Subjective:    Patient ID: Micheal Oliver, male    DOB: 08/30/1950, 69 y.o.   MRN: [id number]  HPI 69 year old never smoker with a history of renal calculi, diabetes, hypertension, hyperlipidemia cerebrovascular disease that manifested as acute posterior left MCA CVA, sub-acute R sided CVA, for which he was admitted 6/18-6/20.  During that hospitalization evaluation he was found to have a thyroid nodule on CT neck, 6.1 cm suprahilar mass with associated adenopathy.  He presents now for further evaluation of the lung mass. His last CSY was 2 years ago.   At his presentation he had word finding difficulty. He is going to follow with Dr Leta Baptist with Neuro No dyspnea, he does have some cough, often happens after eating or taking pills. No phlegm or blood.    CT scan of the chest done 09/19/2019 reviewed by me, shows a 6.1 cm right upper lobe mass that extends into the right suprahilar region with associated right hilar mediastinal adenopathy, impacting the right upper lobe bronchus with right upper lobe volume loss.  Also noted were multiple subcentimeter bilateral pulmonary nodules, question metastatic in nature.   Review of Systems  As per HPI   Past Medical History:  Diagnosis Date  . Chicken pox   . Kidney stones    5-6 times  . Measles   . Mumps      Family History  Problem Relation Age of Onset  . Alcohol abuse Father 80       Deceased  . Heart disease Father   . Lung disease Father   . Hypertension Father   . Stomach cancer Mother 39       Deceased  . Hypertension Paternal Grandmother   . Dementia Paternal Grandmother   . Stroke Paternal Grandmother   . Arthritis Maternal Grandmother   . Sudden death Paternal Grandfather   . Sudden death Maternal Grandfather   . Heart attack Paternal Aunt   . Other Maternal Aunt        Natural Causes  . Healthy Son   . Anxiety disorder Daughter      Social History   Socioeconomic History  . Marital status: Married    Spouse  name: [name]  . Number of children: 2  . Years of education: MBA  . Highest education level: Not on file  Occupational History    Comment: retired, Banker   Tobacco Use  . Smoking status: Never Smoker  . Smokeless tobacco: Never Used  Substance and Sexual Activity  . Alcohol use: No  . Drug use: No  . Sexual activity: Yes  Other Topics Concern  . Not on file  Social History Narrative   Lives at home with wife   Caffeine use- sweet tea, 2 glasses a day   Social Determinants of Health   Financial Resource Strain:   . Difficulty of Paying Living Expenses:   Food Insecurity:   . Worried About Charity fundraiser in the Last Year:   . Arboriculturist in the Last Year:   Transportation Needs:   . Film/video editor (Medical):   Marland Kitchen Lack of Transportation (Non-Medical):   Physical Activity:   . Days of Exercise per Week:   . Minutes of Exercise per Session:   Stress:   . Feeling of Stress :   Social Connections:   . Frequency of Communication with Friends and Family:   . Frequency of Social Gatherings with Friends and Family:   . Attends Religious Services:   .  Active Member of Clubs or Organizations:   . Attends Archivist Meetings:   Marland Kitchen Marital Status:   Intimate Partner Violence:   . Fear of Current or Ex-Partner:   . Emotionally Abused:   Marland Kitchen Physically Abused:   . Sexually Abused:     Second-hand smoke exposure Retired Social research officer, government - lots of chemical exposures, used protective equipment.  No other exposures.  From [place], [place], [place], [place], [place].  No known TB exposure   Allergies  Allergen Reactions  . Bee Venom     Lip swelling     Outpatient Medications Prior to Visit  Medication Sig Dispense Refill  . aspirin 81 MG tablet Take 81 mg by mouth daily.    Marland Kitchen atorvastatin (LIPITOR) 40 MG tablet Take 1 tablet (40 mg total) by mouth daily at 6 PM. 90 tablet 3  . clopidogrel (PLAVIX) 75 MG tablet Take 1 tablet (75 mg total) by mouth daily. 90 tablet 3  .  gabapentin (NEURONTIN) 300 MG capsule Take 1 capsule (300 mg total) by mouth at bedtime. 90 capsule 3  . lisinopril (ZESTRIL) 10 MG tablet Take 1 tablet (10 mg total) by mouth daily. 90 tablet 3  . metFORMIN (GLUCOPHAGE) 500 MG tablet Take 2 tablets (1,000 mg total) by mouth 2 (two) times daily with a meal. 360 tablet 3  . Omega-3 Fatty Acids (FISH OIL) 1200 MG CAPS Take by mouth daily.     No facility-administered medications prior to visit.   As p[er HPI     Objective:   Physical Exam Vitals:   09/24/19 0824  BP: (!) 142/92  Pulse: 79  Temp: 98 F (36.7 C)  TempSrc: Oral  SpO2: 98%  Weight: 211 lb 9.6 oz (96 kg)  Height: 6\' 2"  (1.88 m)   Gen: Pleasant, well-nourished, in no distress,  normal affect  ENT: No lesions,  mouth clear,  oropharynx clear, no postnasal drip  Neck: No JVD, no stridor  Lungs: No use of accessory muscles, no crackles or wheezing on normal respiration, no wheeze on forced expiration  Cardiovascular: RRR, heart sounds normal, no murmur or gallops, no peripheral edema  Musculoskeletal: No deformities, no cyanosis or clubbing  Neuro: alert, awake, non focal  Skin: Warm, no lesions or rash      Assessment & Plan:  Mass of right lung Right upper lobe mass with associated hilar and mediastinal lymphadenopathy.  Believe the most straightforward strategy is endobronchial ultrasound, electromagnetic navigation with biopsies.  Explained the procedure to the patient and his wife.  They understand the benefits, risks and agree to proceed.  I will speak with neurology regarding stopping his Plavix this soon after CVA.  He did not have any stenting or endovascular procedure so I think it should be okay to stop temporarily.  I will arrange the procedure soon as possible.  Baltazar Apo, MD, PhD 09/24/2019, 9:12 AM [facility] [phone] or if no answer [phone]

## 2019-09-24 NOTE — Telephone Encounter (Signed)
Patient scheduled to go to lab on [address], he went to a [facility] in [place]. Patient was directed to correct location. Nothing further needed at this time.

## 2019-09-24 NOTE — Patient Instructions (Signed)
We will arrange for bronchoscopy with biopsies of your right lower upper lobe mass under general anesthesia. You will need to stop your Plavix 5 days prior to the procedure, stop your aspirin 2 days prior to the procedure Follow with Dr Lamonte Sakai in 1 month

## 2019-09-24 NOTE — Telephone Encounter (Signed)
Pt has been scheduled for ENB/EBUS on 6/30 at 1:00 at [facility] OR.  Appt info was given to pt by Raquel Sarna.  Disk to be made from Chest CT from 6/19.

## 2019-09-24 NOTE — Assessment & Plan Note (Signed)
Right upper lobe mass with associated hilar and mediastinal lymphadenopathy.  Believe the most straightforward strategy is endobronchial ultrasound, electromagnetic navigation with biopsies.  Explained the procedure to the patient and his wife.  They understand the benefits, risks and agree to proceed.  I will speak with neurology regarding stopping his Plavix this soon after CVA.  He did not have any stenting or endovascular procedure so I think it should be okay to stop temporarily.  I will arrange the procedure soon as possible.

## 2019-09-24 NOTE — H&P (View-Only) (Signed)
Subjective:    Patient ID: Micheal Oliver, male    DOB: [birth year], 69 y.o.   MRN: [id number]  HPI 69 year old never smoker with a history of renal calculi, diabetes, hypertension, hyperlipidemia cerebrovascular disease that manifested as acute posterior left MCA CVA, sub-acute R sided CVA, for which he was admitted 6/18-6/20.  During that hospitalization evaluation he was found to have a thyroid nodule on CT neck, 6.1 cm suprahilar mass with associated adenopathy.  He presents now for further evaluation of the lung mass. His last CSY was 2 years ago.   At his presentation he had word finding difficulty. He is going to follow with Dr Leta Baptist with Neuro No dyspnea, he does have some cough, often happens after eating or taking pills. No phlegm or blood.    CT scan of the chest done 09/19/2019 reviewed by me, shows a 6.1 cm right upper lobe mass that extends into the right suprahilar region with associated right hilar mediastinal adenopathy, impacting the right upper lobe bronchus with right upper lobe volume loss.  Also noted were multiple subcentimeter bilateral pulmonary nodules, question metastatic in nature.   Review of Systems  As per HPI   Past Medical History:  Diagnosis Date  . Chicken pox   . Kidney stones    5-6 times  . Measles   . Mumps      Family History  Problem Relation Age of Onset  . Alcohol abuse Father 43       Deceased  . Heart disease Father   . Lung disease Father   . Hypertension Father   . Stomach cancer Mother 13       Deceased  . Hypertension Paternal Grandmother   . Dementia Paternal Grandmother   . Stroke Paternal Grandmother   . Arthritis Maternal Grandmother   . Sudden death Paternal Grandfather   . Sudden death Maternal Grandfather   . Heart attack Paternal Aunt   . Other Maternal Aunt        Natural Causes  . Healthy Son   . Anxiety disorder Daughter      Social History   Socioeconomic History  . Marital status: Married    Spouse  name: [name]  . Number of children: 2  . Years of education: MBA  . Highest education level: Not on file  Occupational History    Comment: retired, Banker   Tobacco Use  . Smoking status: Never Smoker  . Smokeless tobacco: Never Used  Substance and Sexual Activity  . Alcohol use: No  . Drug use: No  . Sexual activity: Yes  Other Topics Concern  . Not on file  Social History Narrative   Lives at home with wife   Caffeine use- sweet tea, 2 glasses a day   Social Determinants of Health   Financial Resource Strain:   . Difficulty of Paying Living Expenses:   Food Insecurity:   . Worried About Charity fundraiser in the Last Year:   . Arboriculturist in the Last Year:   Transportation Needs:   . Film/video editor (Medical):   Marland Kitchen Lack of Transportation (Non-Medical):   Physical Activity:   . Days of Exercise per Week:   . Minutes of Exercise per Session:   Stress:   . Feeling of Stress :   Social Connections:   . Frequency of Communication with Friends and Family:   . Frequency of Social Gatherings with Friends and Family:   . Attends Religious Services:   .  Active Member of Clubs or Organizations:   . Attends Archivist Meetings:   Marland Kitchen Marital Status:   Intimate Partner Violence:   . Fear of Current or Ex-Partner:   . Emotionally Abused:   Marland Kitchen Physically Abused:   . Sexually Abused:     Second-hand smoke exposure Retired Social research officer, government - lots of chemical exposures, used protective equipment.  No other exposures.  From [place], [place], [place], [place], [place].  No known TB exposure   Allergies  Allergen Reactions  . Bee Venom     Lip swelling     Outpatient Medications Prior to Visit  Medication Sig Dispense Refill  . aspirin 81 MG tablet Take 81 mg by mouth daily.    Marland Kitchen atorvastatin (LIPITOR) 40 MG tablet Take 1 tablet (40 mg total) by mouth daily at 6 PM. 90 tablet 3  . clopidogrel (PLAVIX) 75 MG tablet Take 1 tablet (75 mg total) by mouth daily. 90 tablet 3  .  gabapentin (NEURONTIN) 300 MG capsule Take 1 capsule (300 mg total) by mouth at bedtime. 90 capsule 3  . lisinopril (ZESTRIL) 10 MG tablet Take 1 tablet (10 mg total) by mouth daily. 90 tablet 3  . metFORMIN (GLUCOPHAGE) 500 MG tablet Take 2 tablets (1,000 mg total) by mouth 2 (two) times daily with a meal. 360 tablet 3  . Omega-3 Fatty Acids (FISH OIL) 1200 MG CAPS Take by mouth daily.     No facility-administered medications prior to visit.   As p[er HPI     Objective:   Physical Exam Vitals:   09/24/19 0824  BP: (!) 142/92  Pulse: 79  Temp: 98 F (36.7 C)  TempSrc: Oral  SpO2: 98%  Weight: 211 lb 9.6 oz (96 kg)  Height: 6\' 2"  (1.88 m)   Gen: Pleasant, well-nourished, in no distress,  normal affect  ENT: No lesions,  mouth clear,  oropharynx clear, no postnasal drip  Neck: No JVD, no stridor  Lungs: No use of accessory muscles, no crackles or wheezing on normal respiration, no wheeze on forced expiration  Cardiovascular: RRR, heart sounds normal, no murmur or gallops, no peripheral edema  Musculoskeletal: No deformities, no cyanosis or clubbing  Neuro: alert, awake, non focal  Skin: Warm, no lesions or rash      Assessment & Plan:  Mass of right lung Right upper lobe mass with associated hilar and mediastinal lymphadenopathy.  Believe the most straightforward strategy is endobronchial ultrasound, electromagnetic navigation with biopsies.  Explained the procedure to the patient and his wife.  They understand the benefits, risks and agree to proceed.  I will speak with neurology regarding stopping his Plavix this soon after CVA.  He did not have any stenting or endovascular procedure so I think it should be okay to stop temporarily.  I will arrange the procedure soon as possible.  Baltazar Apo, MD, PhD 09/24/2019, 9:12 AM [facility] [phone] or if no answer [phone]

## 2019-09-24 NOTE — Addendum Note (Signed)
Addended by: Lorretta Harp on: 09/24/2019 09:18 AM   Modules accepted: Orders

## 2019-09-25 ENCOUNTER — Other Ambulatory Visit: Payer: Self-pay | Admitting: *Deleted

## 2019-09-25 ENCOUNTER — Encounter: Payer: Self-pay | Admitting: *Deleted

## 2019-09-25 NOTE — Patient Outreach (Signed)
[facility] Care Management [facility] CM Telephone Outreach, EMMI Red-Alert notification/ General discharge PCP office completes Transition of Care follow up post-hospital discharge Post-hospital discharge day # 5  09/25/2019  [name] January 15, 1951 [id number]  EMMI Red-Alert notification/ General Discharge EMMI call date/ day #: Wednesday September 23, 2018; day # 1 Red-Alert reason(s):  "Does not know who to call for changes in condition;" and "Questions/ problems"  Successful outgoing telephone call to Micheal Oliver, 69 y/o male referred to [place] yesterday, September 24, 2019 by [facility] CMA for [place] as above; patient was recently hospitalized June 18-20, 2021 for expressive aphasia and headache, and diagnosed with CVA.  He was discharged home to self-care without home health services in place.  Patient has history including, but not limited to, HTN/ HLD; DM- type II; (R) lung mass.  Purpose of call discussed with patient who declines providing HIPAA identifiers other than his last name.  Patient verbally states in very upset tone that he believes the EMMI automated calls are "restrictive" and that people who have been hospitalized can not always answer the automated calls with a "yes" or "no" answer; he states that he is doing fine now and not having any problems.  He is noticeably upset by call today, and despite my attempts to discuss [facility] CM services, he states he is fine and has "everything under control," and has all referrals in place.  Declines [facility] CM letter, does not wish to have EMMI automated calls stopped, stating that he will find "it interesting" to see what the remaining calls are about.  Unable to complete EMMI Red-Alert screening call due to patient unwillingness.  Oneta Rack, RN, BSN, [facility] Care Management  [phone]

## 2019-09-28 ENCOUNTER — Other Ambulatory Visit (HOSPITAL_COMMUNITY)
Admission: RE | Admit: 2019-09-28 | Discharge: 2019-09-28 | Disposition: A | Discharge status: Home / Self Care | Payer: Medicare HMO | Source: Ambulatory Visit | Attending: Emergency Medicine | Admitting: Emergency Medicine

## 2019-09-28 DIAGNOSIS — Z01812 Encounter for preprocedural laboratory examination: Secondary | ICD-10-CM | POA: Insufficient documentation

## 2019-09-28 DIAGNOSIS — Z20822 Contact with and (suspected) exposure to covid-19: Secondary | ICD-10-CM | POA: Insufficient documentation

## 2019-09-28 LAB — SARS CORONAVIRUS 2 (TAT 6-24 HRS): SARS Coronavirus 2: NEGATIVE

## 2019-09-29 ENCOUNTER — Other Ambulatory Visit: Payer: Self-pay

## 2019-09-29 ENCOUNTER — Encounter (HOSPITAL_COMMUNITY): Payer: Self-pay | Admitting: Emergency Medicine

## 2019-09-29 ENCOUNTER — Telehealth: Payer: Self-pay | Admitting: Emergency Medicine

## 2019-09-29 NOTE — Progress Notes (Addendum)
Mr. Radu denies chest pain or shortness of breath. Patient tested negative for Covid_6/28/21and has been in quarantine with his wife since that time.  Mr. Wares has tupe II diabetes, patient does not have a CBG machine. I instructed patient to not take Metformin in am. Dr. Lamonte Sakai consulted  Patient's neurologist regarding Plavix and Aspirin. Last dose of Plavix was 09/24/2019, Aspirin last dose was 09/27/19.

## 2019-09-29 NOTE — Telephone Encounter (Signed)
Called Jan at pre admission testing, she states Patient had stroke earlier this month and Anesthesia was wondering if Dr. Lamonte Sakai spoke with neurology regarding blood thinners since he asked patient to hold for for 5 days.  Dr. Lamonte Sakai please advise on this patient scheduled for procedure 09/30/19

## 2019-09-29 NOTE — Telephone Encounter (Signed)
Micheal Oliver with anesthesia's number given to RB to contact regarding information. Dr. Lamonte Sakai confirmed through epic chat he has spoke with anesthesia.   Nothing further needed at this time.

## 2019-09-29 NOTE — Progress Notes (Addendum)
Anesthesia Chart Review: SAME DAY WORK-UP  Case: [id number] Date/Time: 09/30/19 1245   Procedures:      VIDEO BRONCHOSCOPY WITH ENDOBRONCHIAL NAVIGATION (N/A )     VIDEO BRONCHOSCOPY WITH ENDOBRONCHIAL ULTRASOUND (N/A )   Anesthesia type: General   Pre-op diagnosis: MASS OF RIGHT LUNG   Location: [facility] OR ROOM 09 / [place] [facility]   Surgeons: Collene Gobble, MD      DISCUSSION: Patient is a 69 year old male scheduled for the above procedure. Recently   History includes never smoker, CVA (left MCA infarct with small bilateral subacute occipital and cerebellar infarcts 09/18/19 MRI), DM2, HTN.  - Admitted 09/08/19-09/20/19 for headaches with sudden onset expressive aphasia. MRI showed both acute and subacute infarcts. Neck CTA and echo showed no clear source. No dysrhythmias on telemetry. However, CTA did show a > 5 cm suprahilar mass with adenopathy and bilateral thyroid nodules. Left thyroid nodule biopsy recommended. Pulmonology notified of lung mass with plans for out-patient bronchoscopy. Discharge summary indicates that expressive aphasia "appears to have improved drastically". Loop recorder may be considered in the future.   Reportedly Plavix held for 5 days, ASA 2 days prior to procedure. I spoke with Dr. Brock Ra who confirmed that he had spoken with neurologist about patient needing to hold anti-platelet therapy for planned procedure. Kerney Elbe, MD was the neurologist consulted during recent hospitalization.   09/28/2019 presurgical COVID-19 test negative.  Anesthesia team to evaluate on the day of surgery.    VS:   Wt Readings from Last 3 Encounters:  09/24/19 96 kg  09/21/19 94.1 kg  09/18/19 97.5 kg   BP Readings from Last 3 Encounters:  09/24/19 (!) 142/92  09/21/19 (!) 142/84  09/20/19 (!) 157/105   Pulse Readings from Last 3 Encounters:  09/24/19 79  09/21/19 83  09/20/19 70    PROVIDERS: Copland, Gay Filler, MD his PCP   LABS: Most recent lab results include: Lab Results   Component Value Date   WBC 10.6 (H) 09/24/2019   HGB 14.2 09/24/2019   HCT 41.6 09/24/2019   PLT 218.0 09/24/2019   GLUCOSE 145 (H) 09/24/2019   CHOL 106 09/19/2019   TRIG 184 (H) 09/19/2019   HDL 27 (L) 09/19/2019   LDLCALC 42 09/19/2019   ALT 22 09/24/2019   AST 17 09/24/2019   NA 137 09/24/2019   K 4.7 09/24/2019   CL 105 09/24/2019   CREATININE 1.17 09/24/2019   BUN 17 09/24/2019   CO2 24 09/24/2019   INR 1.2 (H) 09/24/2019   HGBA1C 7.3 (H) 09/19/2019     IMAGES: CT Chest 09/19/19: IMPRESSION: 1. 6.1 cm right upper lobe mass extending to the right suprahilar region with associated right hilar and mediastinal adenopathy. This mass obstructs the associated upper lobe bronchus with volume loss of the right upper lobe. Findings are highly suspicious for primary bronchogenic carcinoma. Multiple subcentimeter bilateral pulmonary nodules as described which may be due to metastatic disease. Recommend referral to [facility]). 2.  Diverticulosis of the colon.  US Thyroid 09/20/19: No adenopathy IMPRESSION: - Left superior thyroid nodule (labeled 2, 2.4 cm) meets criteria for biopsy, as designated by the newly established ACR TI-RADS criteria, and referral for biopsy is recommended. - Right mid thyroid nodule (labeled 1, 0.7 cm, TR 5) meets criteria for surveillance, as designated by the newly established ACR TI-RADS criteria. Surveillance ultrasound study recommended to be performed annually up to 5 years. - Recommendations follow those established by the new ACR TI-RADS  criteria (J Am Coll Radiol 8295;62:130-865).  CTA head/neck 09/19/19: IMPRESSION: 1. No significant proximal stenosis, aneurysm, or branch vessel occlusion within the Circle of Willis. 2. Luxury perfusion in the area of the left parietal infarct. 3. Right hilar and suprahilar mass lesion measuring at least 5.3 x 4.0 x 4.8 cm. This is concerning for a primary lung  neoplasm. Recommend CT of the chest with contrast for further evaluation. 4. Additional hilar adenopathy is concerning for metastatic disease. 5. 1.9 cm hypo dense nodule in the left lobe of the thyroid. Recommend thyroid US (ref: J Am Coll Radiol. 2015 Feb;12(2): 143-50).  MRI Brain 09/18/19: IMPRESSION: 1. 4 cm acute posterior left MCA territory infarct. 2. Small subacute bilateral occipital and cerebellar infarcts. 3. Mild chronic small vessel ischemic disease. - Immediately posterior to this in the lateral aspect of the left occipital lobe is a 2.5 cm region of signal abnormality most compatible with a subacute infarct with a small amount of petechial blood products. (Neurology mentioned follow-up MRI in 3 months to assess for resolution of the abnormal left occipital lobe peri-hemorrhagic enhancement seen on MRI, particularly if plans to consider changing to anticoagulation in the near future.)   EKG: 09/18/19:  Sinus rhythm RSR' in V1 or V2, right VCD or RVH Borderline T abnormalities, inferior leads no prior available for comparison Confirmed by Quintella Reichert [phone]) on 09/18/2019 3:09:34 PM   CV: Echo 09/19/19: IMPRESSIONS  1. Left ventricular ejection fraction, by estimation, is 60 to 65%. The  left ventricle has normal function. The left ventricle has no regional  wall motion abnormalities. There is moderate concentric left ventricular  hypertrophy. Left ventricular  diastolic parameters are consistent with Grade I diastolic dysfunction  (impaired relaxation).  2. Right ventricular systolic function is normal. The right ventricular  size is normal.  3. Left atrial size was mildly dilated.  4. The mitral valve is normal in structure. Mild mitral valve  regurgitation. No evidence of mitral stenosis.  5. The aortic valve is normal in structure. Aortic valve regurgitation is  trivial. No aortic stenosis is present.  6. The inferior vena cava is normal in size  with greater than 50%  respiratory variability, suggesting right atrial pressure of 3 mmHg.  Conclusion(s)/Recommendation(s): No intracardiac source of embolism  detected on this transthoracic study. A transesophageal echocardiogram is  recommended to exclude cardiac source of embolism if clinically indicated.   ETT 07/27/15 (ordered by primary care for family history of CAD):  There was no ST segment deviation noted during stress.  RBBB, rate related during stress  Good exercise effort, 80min.  Normal BP response.  Low risk exercise treadmill test   Past Medical History:  Diagnosis Date   Chicken pox    DM2 (diabetes mellitus, type 2) ([place])    Hypertension    Kidney stones    5-6 times   Measles    Mumps    Stroke [facility])     Past Surgical History:  Procedure Laterality Date   WISDOM TOOTH EXTRACTION  1976-77    MEDICATIONS: No current facility-administered medications for this encounter.    aspirin 81 MG tablet   atorvastatin (LIPITOR) 40 MG tablet   clopidogrel (PLAVIX) 75 MG tablet   gabapentin (NEURONTIN) 300 MG capsule   lisinopril (ZESTRIL) 10 MG tablet   metFORMIN (GLUCOPHAGE) 500 MG tablet   Omega-3 Fatty Acids (FISH OIL) 1200 MG CAPS    Myra Gianotti, PA-C Surgical Short Stay/Anesthesiology [facility] Phone [phone] [facility] Phone [phone])  [phone] 09/29/2019 12:23 PM

## 2019-09-30 ENCOUNTER — Ambulatory Visit (HOSPITAL_COMMUNITY): Payer: Medicare HMO

## 2019-09-30 ENCOUNTER — Ambulatory Visit (HOSPITAL_COMMUNITY): Payer: Medicare HMO | Admitting: Vascular Surgery

## 2019-09-30 ENCOUNTER — Encounter (HOSPITAL_COMMUNITY): Payer: Self-pay | Admitting: Emergency Medicine

## 2019-09-30 ENCOUNTER — Other Ambulatory Visit: Payer: Self-pay

## 2019-09-30 ENCOUNTER — Ambulatory Visit (HOSPITAL_COMMUNITY)
Admission: RE | Admit: 2019-09-30 | Discharge: 2019-09-30 | Disposition: A | Discharge status: Home / Self Care | Payer: Medicare HMO | Attending: Emergency Medicine | Admitting: Emergency Medicine

## 2019-09-30 ENCOUNTER — Encounter (HOSPITAL_COMMUNITY)
Admission: RE | Disposition: A | Discharge status: Home / Self Care | Payer: Self-pay | Source: Home / Self Care | Attending: Emergency Medicine

## 2019-09-30 DIAGNOSIS — C342 Malignant neoplasm of middle lobe, bronchus or lung: Secondary | ICD-10-CM | POA: Diagnosis not present

## 2019-09-30 DIAGNOSIS — Z7902 Long term (current) use of antithrombotics/antiplatelets: Secondary | ICD-10-CM | POA: Insufficient documentation

## 2019-09-30 DIAGNOSIS — C677 Malignant neoplasm of urachus: Secondary | ICD-10-CM | POA: Diagnosis not present

## 2019-09-30 DIAGNOSIS — C3411 Malignant neoplasm of upper lobe, right bronchus or lung: Secondary | ICD-10-CM | POA: Diagnosis not present

## 2019-09-30 DIAGNOSIS — Z87442 Personal history of urinary calculi: Secondary | ICD-10-CM | POA: Insufficient documentation

## 2019-09-30 DIAGNOSIS — Z7984 Long term (current) use of oral hypoglycemic drugs: Secondary | ICD-10-CM | POA: Diagnosis not present

## 2019-09-30 DIAGNOSIS — Z7982 Long term (current) use of aspirin: Secondary | ICD-10-CM | POA: Insufficient documentation

## 2019-09-30 DIAGNOSIS — E785 Hyperlipidemia, unspecified: Secondary | ICD-10-CM | POA: Insufficient documentation

## 2019-09-30 DIAGNOSIS — R590 Localized enlarged lymph nodes: Secondary | ICD-10-CM | POA: Diagnosis not present

## 2019-09-30 DIAGNOSIS — C771 Secondary and unspecified malignant neoplasm of intrathoracic lymph nodes: Secondary | ICD-10-CM | POA: Insufficient documentation

## 2019-09-30 DIAGNOSIS — I10 Essential (primary) hypertension: Secondary | ICD-10-CM | POA: Diagnosis not present

## 2019-09-30 DIAGNOSIS — R918 Other nonspecific abnormal finding of lung field: Secondary | ICD-10-CM | POA: Diagnosis not present

## 2019-09-30 DIAGNOSIS — E119 Type 2 diabetes mellitus without complications: Secondary | ICD-10-CM | POA: Insufficient documentation

## 2019-09-30 DIAGNOSIS — Z9889 Other specified postprocedural states: Secondary | ICD-10-CM

## 2019-09-30 DIAGNOSIS — Z79899 Other long term (current) drug therapy: Secondary | ICD-10-CM | POA: Insufficient documentation

## 2019-09-30 DIAGNOSIS — Z8673 Personal history of transient ischemic attack (TIA), and cerebral infarction without residual deficits: Secondary | ICD-10-CM

## 2019-09-30 DIAGNOSIS — G245 Blepharospasm: Secondary | ICD-10-CM

## 2019-09-30 DIAGNOSIS — Z419 Encounter for procedure for purposes other than remedying health state, unspecified: Secondary | ICD-10-CM

## 2019-09-30 HISTORY — DX: Cerebral infarction, unspecified: I63.9

## 2019-09-30 HISTORY — PX: VIDEO BRONCHOSCOPY WITH ENDOBRONCHIAL ULTRASOUND: SHX6177

## 2019-09-30 HISTORY — DX: Type 2 diabetes mellitus without complications: E11.9

## 2019-09-30 HISTORY — DX: Personal history of urinary calculi: Z87.442

## 2019-09-30 HISTORY — DX: Essential (primary) hypertension: I10

## 2019-09-30 LAB — GLUCOSE, CAPILLARY
Glucose-Capillary: 157 mg/dL — ABNORMAL HIGH (ref 70–99)
Glucose-Capillary: 149 mg/dL — ABNORMAL HIGH (ref 70–99)

## 2019-09-30 SURGERY — BRONCHOSCOPY, WITH EBUS
Anesthesia: General

## 2019-09-30 MED ORDER — DEXAMETHASONE SODIUM PHOSPHATE 10 MG/ML IJ SOLN
INTRAMUSCULAR | Status: DC | PRN
  Administered 2019-09-30: 10 mg via INTRAVENOUS

## 2019-09-30 MED ORDER — LIDOCAINE 2% (20 MG/ML) 5 ML SYRINGE
INTRAMUSCULAR | Status: AC
  Filled 2019-09-30: qty 10

## 2019-09-30 MED ORDER — SUCCINYLCHOLINE CHLORIDE 200 MG/10ML IV SOSY
PREFILLED_SYRINGE | INTRAVENOUS | Status: AC
  Filled 2019-09-30: qty 10

## 2019-09-30 MED ORDER — CLOPIDOGREL BISULFATE 75 MG PO TABS: 75.0000 mg | ORAL_TABLET | Freq: Every day | ORAL | 3 refills | Status: DC

## 2019-09-30 MED ORDER — 0.9 % SODIUM CHLORIDE (POUR BTL) OPTIME
TOPICAL | Status: DC | PRN
  Administered 2019-09-30 (×2): 1000 mL

## 2019-09-30 MED ORDER — FENTANYL CITRATE (PF) 250 MCG/5ML IJ SOLN
INTRAMUSCULAR | Status: AC
  Filled 2019-09-30: qty 5

## 2019-09-30 MED ORDER — ROCURONIUM BROMIDE 10 MG/ML (PF) SYRINGE
PREFILLED_SYRINGE | INTRAVENOUS | Status: AC
  Filled 2019-09-30: qty 10

## 2019-09-30 MED ORDER — MIDAZOLAM HCL 2 MG/2ML IJ SOLN
INTRAMUSCULAR | Status: DC | PRN
  Administered 2019-09-30: 2 mg via INTRAVENOUS

## 2019-09-30 MED ORDER — FENTANYL CITRATE (PF) 100 MCG/2ML IJ SOLN: 25.0000 ug | INTRAMUSCULAR | Status: DC | PRN

## 2019-09-30 MED ORDER — EPINEPHRINE PF 1 MG/ML IJ SOLN
INTRAMUSCULAR | Status: DC | PRN
  Administered 2019-09-30: 1 mg via ENDOTRACHEOPULMONARY

## 2019-09-30 MED ORDER — PROPOFOL 10 MG/ML IV BOLUS
INTRAVENOUS | Status: DC | PRN
  Administered 2019-09-30: 160 mg via INTRAVENOUS

## 2019-09-30 MED ORDER — PHENYLEPHRINE 40 MCG/ML (10ML) SYRINGE FOR IV PUSH (FOR BLOOD PRESSURE SUPPORT)
PREFILLED_SYRINGE | INTRAVENOUS | Status: DC | PRN
  Administered 2019-09-30: 40 ug via INTRAVENOUS
  Administered 2019-09-30: 80 ug via INTRAVENOUS

## 2019-09-30 MED ORDER — ONDANSETRON HCL 4 MG/2ML IJ SOLN
INTRAMUSCULAR | Status: AC
  Filled 2019-09-30: qty 2

## 2019-09-30 MED ORDER — EPHEDRINE SULFATE-NACL 50-0.9 MG/10ML-% IV SOSY
PREFILLED_SYRINGE | INTRAVENOUS | Status: DC | PRN
  Administered 2019-09-30 (×2): 5 mg via INTRAVENOUS

## 2019-09-30 MED ORDER — EPHEDRINE 5 MG/ML INJ
INTRAVENOUS | Status: AC
  Filled 2019-09-30: qty 10

## 2019-09-30 MED ORDER — ROCURONIUM BROMIDE 10 MG/ML (PF) SYRINGE
PREFILLED_SYRINGE | INTRAVENOUS | Status: AC
  Filled 2019-09-30: qty 20

## 2019-09-30 MED ORDER — CHLORHEXIDINE GLUCONATE 0.12 % MT SOLN: 15.0000 mL | Freq: Once | OROMUCOSAL | Status: AC

## 2019-09-30 MED ORDER — LIDOCAINE 2% (20 MG/ML) 5 ML SYRINGE
INTRAMUSCULAR | Status: AC
  Filled 2019-09-30: qty 5

## 2019-09-30 MED ORDER — DEXAMETHASONE SODIUM PHOSPHATE 10 MG/ML IJ SOLN
INTRAMUSCULAR | Status: AC
  Filled 2019-09-30: qty 1

## 2019-09-30 MED ORDER — SUCCINYLCHOLINE CHLORIDE 200 MG/10ML IV SOSY
PREFILLED_SYRINGE | INTRAVENOUS | Status: DC | PRN
  Administered 2019-09-30: 120 mg via INTRAVENOUS

## 2019-09-30 MED ORDER — ORAL CARE MOUTH RINSE: 15.0000 mL | Freq: Once | OROMUCOSAL | Status: AC

## 2019-09-30 MED ORDER — EPINEPHRINE PF 1 MG/ML IJ SOLN
INTRAMUSCULAR | Status: AC
  Filled 2019-09-30: qty 1

## 2019-09-30 MED ORDER — ONDANSETRON HCL 4 MG/2ML IJ SOLN
INTRAMUSCULAR | Status: DC | PRN
  Administered 2019-09-30: 4 mg via INTRAVENOUS

## 2019-09-30 MED ORDER — PHENYLEPHRINE 40 MCG/ML (10ML) SYRINGE FOR IV PUSH (FOR BLOOD PRESSURE SUPPORT)
PREFILLED_SYRINGE | INTRAVENOUS | Status: AC
  Filled 2019-09-30: qty 10

## 2019-09-30 MED ORDER — LIDOCAINE 2% (20 MG/ML) 5 ML SYRINGE
INTRAMUSCULAR | Status: DC | PRN
  Administered 2019-09-30: 100 mg via INTRAVENOUS

## 2019-09-30 MED ORDER — ROCURONIUM BROMIDE 10 MG/ML (PF) SYRINGE
PREFILLED_SYRINGE | INTRAVENOUS | Status: DC | PRN
  Administered 2019-09-30: 60 mg via INTRAVENOUS

## 2019-09-30 MED ORDER — EPINEPHRINE 1 MG/10ML IJ SOSY
PREFILLED_SYRINGE | INTRAMUSCULAR | Status: AC
  Filled 2019-09-30: qty 10

## 2019-09-30 MED ORDER — MIDAZOLAM HCL 2 MG/2ML IJ SOLN
INTRAMUSCULAR | Status: AC
  Filled 2019-09-30: qty 2

## 2019-09-30 MED ORDER — CHLORHEXIDINE GLUCONATE 0.12 % MT SOLN
OROMUCOSAL | Status: AC
  Administered 2019-09-30: 15 mL via OROMUCOSAL
  Filled 2019-09-30: qty 15

## 2019-09-30 MED ORDER — MEPERIDINE HCL 25 MG/ML IJ SOLN: 6.2500 mg | INTRAMUSCULAR | Status: DC | PRN

## 2019-09-30 MED ORDER — LACTATED RINGERS IV SOLN: INTRAVENOUS | Status: DC

## 2019-09-30 MED ORDER — ONDANSETRON HCL 4 MG/2ML IJ SOLN: 4.0000 mg | Freq: Once | INTRAMUSCULAR | Status: DC | PRN

## 2019-09-30 MED ORDER — PROPOFOL 10 MG/ML IV BOLUS
INTRAVENOUS | Status: AC
  Filled 2019-09-30: qty 20

## 2019-09-30 MED ORDER — FENTANYL CITRATE (PF) 250 MCG/5ML IJ SOLN
INTRAMUSCULAR | Status: DC | PRN
  Administered 2019-09-30: 100 ug via INTRAVENOUS

## 2019-09-30 MED ORDER — SUGAMMADEX SODIUM 200 MG/2ML IV SOLN
INTRAVENOUS | Status: DC | PRN
  Administered 2019-09-30: 200 mg via INTRAVENOUS

## 2019-09-30 SURGICAL SUPPLY — 49 items
ADAPTER BRONCHOSCOPE OLYMPUS (ADAPTER) ×2 IMPLANT
ADAPTER VALVE BIOPSY EBUS (MISCELLANEOUS) IMPLANT
ADPTR VALVE BIOPSY EBUS (MISCELLANEOUS)
BRUSH CYTOL CELLEBRITY 1.5X140 (MISCELLANEOUS) ×2 IMPLANT
BRUSH SUPERTRAX BIOPSY (INSTRUMENTS) IMPLANT
BRUSH SUPERTRAX NDL-TIP CYTO (INSTRUMENTS) IMPLANT
CANISTER SUCT 3000ML PPV (MISCELLANEOUS) ×2 IMPLANT
CNTNR URN SCR LID CUP LEK RST (MISCELLANEOUS) ×2 IMPLANT
CONT SPEC 4OZ STRL OR WHT (MISCELLANEOUS) ×4
COVER BACK TABLE 60X90IN (DRAPES) ×2 IMPLANT
COVER WAND RF STERILE (DRAPES) IMPLANT
FILTER STRAW FLUID ASPIR (MISCELLANEOUS) ×2 IMPLANT
FORCEPS BIOP 1.5 SINGLE USE (MISCELLANEOUS) IMPLANT
FORCEPS BIOP RJ4 1.8 (CUTTING FORCEPS) ×2 IMPLANT
FORCEPS BIOP SUPERTRX PREMAR (INSTRUMENTS) IMPLANT
GAUZE SPONGE 4X4 12PLY STRL (GAUZE/BANDAGES/DRESSINGS) ×2 IMPLANT
GLOVE BIO SURGEON STRL SZ7 (GLOVE) ×2 IMPLANT
GLOVE BIO SURGEON STRL SZ7.5 (GLOVE) ×4 IMPLANT
GLOVE BIOGEL PI IND STRL 6 (GLOVE) ×2 IMPLANT
GLOVE BIOGEL PI INDICATOR 6 (GLOVE) ×2
GOWN STRL REUS W/ TWL LRG LVL3 (GOWN DISPOSABLE) ×2 IMPLANT
GOWN STRL REUS W/TWL LRG LVL3 (GOWN DISPOSABLE) ×4
KIT CLEAN ENDO COMPLIANCE (KITS) ×4 IMPLANT
KIT ILLUMISITE 180 PROCEDURE (KITS) IMPLANT
KIT ILLUMISITE 90 PROCEDURE (KITS) IMPLANT
KIT LOCATABLE GUIDE (CANNULA) IMPLANT
KIT MARKER FIDUCIAL DELIVERY (KITS) IMPLANT
KIT TURNOVER KIT B (KITS) ×2 IMPLANT
MARKER SKIN DUAL TIP RULER LAB (MISCELLANEOUS) ×2 IMPLANT
NEEDLE ASPIRATION VIZISHOT 19G (NEEDLE) ×2 IMPLANT
NEEDLE ASPIRATION VIZISHOT 21G (NEEDLE) IMPLANT
NEEDLE SUPERTRX PREMARK BIOPSY (NEEDLE) IMPLANT
NEEDLE WANG 19GA 15MM 130CM (NEEDLE) ×2 IMPLANT
NS IRRIG 1000ML POUR BTL (IV SOLUTION) ×2 IMPLANT
OIL SILICONE PENTAX (PARTS (SERVICE/REPAIRS)) ×2 IMPLANT
PAD ARMBOARD 7.5X6 YLW CONV (MISCELLANEOUS) ×4 IMPLANT
PATCHES PATIENT (LABEL) ×6 IMPLANT
SYR 20ML ECCENTRIC (SYRINGE) ×4 IMPLANT
SYR 20ML LL LF (SYRINGE) ×4 IMPLANT
SYR 50ML SLIP (SYRINGE) IMPLANT
SYR 5ML LUER SLIP (SYRINGE) IMPLANT
TOWEL GREEN STERILE FF (TOWEL DISPOSABLE) ×2 IMPLANT
TRAP SPECIMEN MUCUS 40CC (MISCELLANEOUS) ×2 IMPLANT
TUBE CONNECTING 20X1/4 (TUBING) ×4 IMPLANT
UNDERPAD 30X36 HEAVY ABSORB (UNDERPADS AND DIAPERS) ×2 IMPLANT
VALVE BIOPSY  SINGLE USE (MISCELLANEOUS) ×4
VALVE BIOPSY SINGLE USE (MISCELLANEOUS) ×2 IMPLANT
VALVE SUCTION BRONCHIO DISP (MISCELLANEOUS) ×4 IMPLANT
WATER STERILE IRR 1000ML POUR (IV SOLUTION) ×2 IMPLANT

## 2019-09-30 NOTE — Anesthesia Postprocedure Evaluation (Signed)
Anesthesia Post Note  Patient: Micheal Oliver  Procedure(s) Performed: VIDEO BRONCHOSCOPY WITH ENDOBRONCHIAL ULTRASOUND (N/A )     Patient location during evaluation: PACU Anesthesia Type: General Level of consciousness: sedated and patient cooperative Pain management: pain level controlled Vital Signs Assessment: post-procedure vital signs reviewed and stable Respiratory status: spontaneous breathing Cardiovascular status: stable Anesthetic complications: no   No complications documented.  Last Vitals:  Vitals:   09/30/19 1300 09/30/19 1313  BP: (!) 142/92 129/68  Pulse: (!) 101 100  Resp: 19 19  Temp:  36.8 C  SpO2: 99% 97%    Last Pain:  Vitals:   09/30/19 1313  TempSrc:   PainSc: 0-No pain                 Nolon Nations

## 2019-09-30 NOTE — Discharge Instructions (Signed)
Flexible Bronchoscopy, Care After This sheet gives you information about how to care for yourself after your test. Your doctor may also give you more specific instructions. If you have problems or questions, contact your doctor. Follow these instructions at home: Eating and drinking  Do not eat or drink anything (not even water) for 2 hours after your test, or until your numbing medicine (local anesthetic) wears off.  When your numbness is gone and your cough and gag reflexes have come back, you may: ? Eat only soft foods. ? Slowly drink liquids.  The day after the test, go back to your normal diet. Driving  Do not drive for 24 hours if you were given a medicine to help you relax (sedative).  Do not drive or use heavy machinery while taking prescription pain medicine. General instructions   Take over-the-counter and prescription medicines only as told by your doctor.  Return to your normal activities as told. Ask what activities are safe for you.  Do not use any products that have nicotine or tobacco in them. This includes cigarettes and e-cigarettes. If you need help quitting, ask your doctor.  Keep all follow-up visits as told by your doctor. This is important. It is very important if you had a tissue sample (biopsy) taken. Get help right away if:  You have shortness of breath that gets worse.  You get light-headed.  You feel like you are going to pass out (faint).  You have chest pain.  You cough up: ? More than a little blood. ? More blood than before. Summary  Do not eat or drink anything (not even water) for 2 hours after your test, or until your numbing medicine wears off.  Do not use cigarettes. Do not use e-cigarettes.  Get help right away if you have chest pain.  Please call our office for any questions or concerns.  [phone].  You may restart your Plavix once daily on 10/01/2019.  This information is not intended to replace advice given to you by your  health care provider. Make sure you discuss any questions you have with your health care provider. Document Revised: 03/01/2017 Document Reviewed: 04/06/2016 Elsevier Patient Education  2020 [employer].

## 2019-09-30 NOTE — Anesthesia Preprocedure Evaluation (Addendum)
Anesthesia Evaluation  Patient identified by MRN, date of birth, ID band Patient awake    Reviewed: Allergy & Precautions, NPO status , Patient's Chart, lab work & pertinent test results  Airway Mallampati: II  TM Distance: >3 FB Neck ROM: Full    Dental  (+) Dental Advisory Given, Teeth Intact   Pulmonary neg pulmonary ROS,    Pulmonary exam normal breath sounds clear to auscultation       Cardiovascular hypertension, Pt. on medications Normal cardiovascular exam Rhythm:Regular Rate:Normal  Echo 09/19/2019 1. Left ventricular ejection fraction, by estimation, is 60 to 65%. The left ventricle has normal function. The left ventricle has no regional wall motion abnormalities. There is moderate concentric left ventricular hypertrophy. Left ventricular diastolic parameters are consistent with Grade I diastolic dysfunction (impaired relaxation).  2. Right ventricular systolic function is normal. The right ventricular size is normal.  3. Left atrial size was mildly dilated.  4. The mitral valve is normal in structure. Mild mitral valve  regurgitation. No evidence of mitral stenosis.  5. The aortic valve is normal in structure. Aortic valve regurgitation is trivial. No aortic stenosis is present.  6. The inferior vena cava is normal in size with greater than 50% respiratory variability, suggesting right atrial pressure of 3 mmHg.    Neuro/Psych CVA, Residual Symptoms negative psych ROS   GI/Hepatic negative GI ROS, Neg liver ROS,   Endo/Other  diabetes, Type 2, Oral Hypoglycemic Agents  Renal/GU Renal disease     Musculoskeletal negative musculoskeletal ROS (+)   Abdominal   Peds  Hematology negative hematology ROS (+)   Anesthesia Other Findings   Reproductive/Obstetrics                            Anesthesia Physical Anesthesia Plan  ASA: III  Anesthesia Plan: General   Post-op Pain  Management:    Induction: Intravenous  PONV Risk Score and Plan: 2 and Ondansetron and Dexamethasone  Airway Management Planned: Oral ETT  Additional Equipment: None  Intra-op Plan:   Post-operative Plan: Extubation in OR  Informed Consent: I have reviewed the patients History and Physical, chart, labs and discussed the procedure including the risks, benefits and alternatives for the proposed anesthesia with the patient or authorized representative who has indicated his/her understanding and acceptance.     Dental advisory given  Plan Discussed with: CRNA  Anesthesia Plan Comments:        Anesthesia Quick Evaluation

## 2019-09-30 NOTE — Anesthesia Procedure Notes (Signed)
Procedure Name: Intubation Date/Time: 09/30/2019 11:45 AM Performed by: Teressa Lower., CRNA Pre-anesthesia Checklist: Patient identified, Emergency Drugs available, Suction available and Patient being monitored Patient Re-evaluated:Patient Re-evaluated prior to induction Oxygen Delivery Method: Circle system utilized Preoxygenation: Pre-oxygenation with 100% oxygen Induction Type: IV induction and Cricoid Pressure applied Ventilation: Mask ventilation without difficulty Laryngoscope Size: 4 and Glidescope Grade View: Grade I Tube type: Oral Tube size: 8.5 mm Number of attempts: 2 Airway Equipment and Method: Stylet and Video-laryngoscopy Placement Confirmation: ETT inserted through vocal cords under direct vision,  positive ETCO2 and breath sounds checked- equal and bilateral Secured at: 25 cm Tube secured with: Tape Dental Injury: Teeth and Oropharynx as per pre-operative assessment  Difficulty Due To: Difficult Airway- due to dentition Comments: Dl x1 with Mac 4, grade 2b view, unable to pass 8.5 ett, second attempt with glidescope Go 4, grade 1 view, ett secured.BBS. Atraumatic.

## 2019-09-30 NOTE — Op Note (Signed)
Video Bronchoscopy with Endobronchial Ultrasound Procedure Note  Date of Operation: 09/30/2019  Pre-op Diagnosis: Right upper lobe mass, mediastinal adenopathy  Post-op Diagnosis: Same  Surgeon: Baltazar Apo  Assistants: None  Anesthesia: General endotracheal anesthesia  Operation: Flexible video fiberoptic bronchoscopy with endobronchial ultrasound and biopsies.  Estimated Blood Loss: Minimal  Complications: None apparent  Indications and History: Micheal Oliver is a 69 y.o. male with history of tobacco use, cerebrovascular disease.  He was found to have a right upper lobe mass with mediastinal adenopathy on CT scan of the chest.  Recommendation was made to seek a tissue diagnosis via bronchoscopy with endobronchial ultrasound.  The risks, benefits, complications, treatment options and expected outcomes were discussed with the patient.  The possibilities of pneumothorax, pneumonia, reaction to medication, pulmonary aspiration, perforation of a viscus, bleeding, failure to diagnose a condition and creating a complication requiring transfusion or operation were discussed with the patient who freely signed the consent.    Description of Procedure: The patient was examined in the preoperative area and history and data from the preprocedure consultation were reviewed. It was deemed appropriate to proceed.  The patient was taken to [facility] in [place], identified as Micheal Oliver and the procedure verified as Flexible Video Fiberoptic Bronchoscopy.  A Time Out was held and the above information confirmed. After being taken to the operating room general anesthesia was initiated and the patient  was orally intubated. The video fiberoptic bronchoscope was introduced via the endotracheal tube and a general inspection was performed which showed normal airways on the left.  The bronchus intermedius, right middle lobe and right lower lobe airways were all normal.  There was narrowing of the posterior  segment of the right upper lobe in a fishmouth fashion with suspected mucosal irregularity.  The apical segment of the right upper lobe was also concentrically narrowed with some mild irregularity.  Endobronchial brushings, needle biopsies and forceps biopsies were performed in the right upper lobe bronchus to be sent for cytology and pathology. The standard scope was then withdrawn and the endobronchial ultrasound was used to identify and characterize the peritracheal, hilar and bronchial lymph nodes. Inspection showed a significantly enlarged 4R node, multiple 7 nodes were enlarged in a chain. Using real-time ultrasound guidance Wang needle biopsies were take from Station 4R and 7 nodes and were sent for cytology. The patient tolerated the procedure well without apparent complications. There was no significant blood loss. The bronchoscope was withdrawn. Anesthesia was reversed and the patient was taken to the PACU for recovery.   Samples: 1. Wang needle biopsies from 4R node 2. Wang needle biopsies from 7 node 3.  Endobronchial brushings from posterior segment of right upper lobe airway 4.  Endobronchial needle biopsy from posterior segment of right upper lobe airway 5.  Endobronchial forceps biopsies from posterior segment of the right upper lobe airway  Plans:  The patient will be discharged from the PACU to home when recovered from anesthesia. We will review the cytology, pathology results with the patient when they become available. Outpatient followup will be with Dr Lamonte Sakai.    Baltazar Apo, MD, PhD 09/30/2019, 12:38 PM [facility] [phone] or if no answer [phone]

## 2019-09-30 NOTE — Interval H&P Note (Signed)
History and Physical Interval Note:  09/30/2019 10:36 AM  Micheal Oliver  has presented today for surgery, with the diagnosis of MASS OF RIGHT LUNG.  The various methods of treatment have been discussed with the patient and family. After consideration of risks, benefits and other options for treatment, the patient has consented to  Procedure(s): VIDEO BRONCHOSCOPY WITH ENDOBRONCHIAL NAVIGATION (N/A) VIDEO BRONCHOSCOPY WITH ENDOBRONCHIAL ULTRASOUND (N/A) as a surgical intervention.  The patient's history has been reviewed, patient examined, no change in status, stable for surgery.  I have reviewed the patient's chart and labs.  Questions were answered to the patient's satisfaction.     Collene Gobble

## 2019-09-30 NOTE — Transfer of Care (Signed)
Immediate Anesthesia Transfer of Care Note  Patient: Micheal Oliver  Procedure(s) Performed: VIDEO BRONCHOSCOPY WITH ENDOBRONCHIAL ULTRASOUND (N/A )  Patient Location: PACU  Anesthesia Type:General  Level of Consciousness: awake, alert  and oriented  Airway & Oxygen Therapy: Patient Spontanous Breathing and Patient connected to face mask oxygen  Post-op Assessment: Report given to RN and Post -op Vital signs reviewed and stable  Post vital signs: Reviewed and stable  Last Vitals:  Vitals Value Taken Time  BP 148/87 09/30/19 1245  Temp 36.2 C 09/30/19 1245  Pulse 105 09/30/19 1247  Resp 17 09/30/19 1247  SpO2 100 % 09/30/19 1247  Vitals shown include unvalidated device data.  Last Pain:  Vitals:   09/30/19 1038  TempSrc:   PainSc: 0-No pain         Complications: No complications documented.

## 2019-10-01 ENCOUNTER — Encounter: Payer: Self-pay | Admitting: Family Medicine

## 2019-10-01 ENCOUNTER — Encounter (HOSPITAL_COMMUNITY): Payer: Self-pay | Admitting: Emergency Medicine

## 2019-10-01 ENCOUNTER — Ambulatory Visit (HOSPITAL_BASED_OUTPATIENT_CLINIC_OR_DEPARTMENT_OTHER): Admission: RE | Admit: 2019-10-01 | Payer: Medicare HMO | Source: Ambulatory Visit

## 2019-10-01 DIAGNOSIS — C801 Malignant (primary) neoplasm, unspecified: Secondary | ICD-10-CM

## 2019-10-01 HISTORY — DX: Malignant (primary) neoplasm, unspecified: C80.1

## 2019-10-02 ENCOUNTER — Telehealth: Payer: Self-pay | Admitting: Emergency Medicine

## 2019-10-02 DIAGNOSIS — R918 Other nonspecific abnormal finding of lung field: Secondary | ICD-10-CM

## 2019-10-02 LAB — CYTOLOGY - NON PAP

## 2019-10-02 LAB — SURGICAL PATHOLOGY

## 2019-10-02 NOTE — Telephone Encounter (Signed)
Tissue diagnosis with the patient by phone.  He is right upper lobe needle aspiration, 4R, 7 cytologies all show adenocarcinoma.  He wants to be referred to the [facility].  I will make the referral to [facility]

## 2019-10-06 ENCOUNTER — Encounter: Payer: Self-pay | Admitting: *Deleted

## 2019-10-06 ENCOUNTER — Telehealth: Payer: Self-pay | Admitting: *Deleted

## 2019-10-06 DIAGNOSIS — R918 Other nonspecific abnormal finding of lung field: Secondary | ICD-10-CM

## 2019-10-06 NOTE — Telephone Encounter (Signed)
I received referral on Micheal Oliver today.  I called and scheduled him to be seen.  They will not be back in town until next week.  I will schedule with Dr. Julien Nordmann next week.  I will update Rad Onc that he is out of town this week but will be back next week.

## 2019-10-06 NOTE — Progress Notes (Signed)
Referral to rad onc completed

## 2019-10-07 NOTE — Progress Notes (Signed)
Thoracic Location of Tumor / Histology: Right Lung mass  Patient presented with stroke like symptoms.  He had has some word finding difficulty and memory loss.  Was seen in the ER with Code stroke.  EBUS 09/30/2019   CT Chest 09/19/2019: 6.1 cm right upper lobe mass that extends into the right suprahilar region with associated right hilar mediastinal adenopathy, impacting the right upper lobe bronchus with right upper lobe volume loss.  Also noted were multiple sub-centimeter bilateral pulmonary nodules.   MRI Brain 09/18/2019: 4 cm acute posterior left MCA territory infarct.  Small subacute bilateral occipital and cerebellar infarcts.  Mild chronic small vessel ischemic disease.  Biopsies of Right Upper Lobe Lung 09/30/2019    Tobacco/Marijuana/Snuff/ETOH use: Non smoker  Past/Anticipated interventions by cardiothoracic surgery, if any:   Past/Anticipated interventions by medical oncology, if any:  Dr. Julien Nordmann 10/15/2019   Signs/Symptoms  Weight changes, if any: No weight loss noted before he had his stroke.  Respiratory complaints, if any: No  Hemoptysis, if any: Non productive cough.  States he is coughing less and more gentler than before his diagnosis.  Pain issues, if any:  No  SAFETY ISSUES:  Prior radiation? No  Pacemaker/ICD? No  Possible current pregnancy? n/a  Is the patient on methotrexate? No  Current Complaints / other details:

## 2019-10-08 ENCOUNTER — Ambulatory Visit
Admission: RE | Admit: 2019-10-08 | Discharge: 2019-10-08 | Disposition: A | Discharge status: Home / Self Care | Payer: Medicare HMO | Source: Ambulatory Visit | Attending: Radiation Oncology | Admitting: Radiation Oncology

## 2019-10-08 ENCOUNTER — Other Ambulatory Visit: Payer: Self-pay | Admitting: *Deleted

## 2019-10-08 ENCOUNTER — Other Ambulatory Visit: Payer: Self-pay

## 2019-10-08 ENCOUNTER — Encounter: Payer: Self-pay | Admitting: Radiation Oncology

## 2019-10-08 VITALS — Ht 74.0 in | Wt 215.0 lb

## 2019-10-08 DIAGNOSIS — C3411 Malignant neoplasm of upper lobe, right bronchus or lung: Secondary | ICD-10-CM | POA: Diagnosis not present

## 2019-10-08 NOTE — Progress Notes (Signed)
The proposed treatment discussed in cancer conference 10/08/19 is for discussion purpose only and is not a binding recommendation.  The patient was not physically examined nor present for their treatment options.  Therefore, final treatment plans cannot be decided.

## 2019-10-11 DIAGNOSIS — C3411 Malignant neoplasm of upper lobe, right bronchus or lung: Secondary | ICD-10-CM | POA: Insufficient documentation

## 2019-10-11 NOTE — Progress Notes (Signed)
Radiation Oncology         [phone] ________________________________  Name: Micheal Oliver MRN: [id number]  Date: 10/08/2019  DOB: May 12, 1950  VP:[id number], Micheal Filler, MD  Micheal Bears, MD     REFERRING PHYSICIAN: Curt Bears, MD   DIAGNOSIS: The encounter diagnosis was Malignant neoplasm of right upper lobe of lung ([place]).   HISTORY OF PRESENT ILLNESS::Micheal Oliver is a 69 y.o. male who is seen for an initial consultation visit regarding the patient's diagnosis of non-small cell lung cancer of the right lung.  The patient has been found to have a suspicious abnormality within the right lung and a CT scan of the chest demonstrated a 6.1 cm right upper lobe tumor extending to the suprahilar region.  Associated right hilar and mediastinal lymphadenopathy was also present.  Additionally, multiple subcentimeter pulmonary nodules bilaterally were also seen, concerning for possible metastatic disease.  A brain MRI scan did not reveal any evidence of metastases.  Biopsy returned positive for adenocarcinoma of the lung.  The patient feels that he is doing relatively well at this time.  He states that he feels stronger overall and has a good appetite.  He does have an occasional cough.  The patient has a pending PET scan and also will see medical oncology next week.    PREVIOUS RADIATION THERAPY: No   PAST MEDICAL HISTORY:  has a past medical history of Chicken pox, DM2 (diabetes mellitus, type 2) ([place]), History of kidney stones, Hypertension, Kidney stones, Measles, Mumps, and Stroke ([place]).     PAST SURGICAL HISTORY: Past Surgical History:  Procedure Laterality Date  . COLONOSCOPY W/ POLYPECTOMY    . VIDEO BRONCHOSCOPY WITH ENDOBRONCHIAL ULTRASOUND N/A 09/30/2019   Procedure: VIDEO BRONCHOSCOPY WITH ENDOBRONCHIAL ULTRASOUND;  Surgeon: Collene Gobble, MD;  Location: [facility] OR;  Service: Thoracic;  Laterality: N/A;  . WISDOM TOOTH EXTRACTION  [id number]     FAMILY HISTORY: family  history includes Alcohol abuse (age of onset: 32) in his father; Anxiety disorder in his daughter; Arthritis in his maternal grandmother; Dementia in his paternal grandmother; Healthy in his son; Heart attack in his paternal aunt; Heart disease in his father; Hypertension in his father and paternal grandmother; Lung disease in his father; Other in his maternal aunt; Stomach cancer (age of onset: 4) in his mother; Stroke in his paternal grandmother; Sudden death in his maternal grandfather and paternal grandfather.   SOCIAL HISTORY:  reports that he has never smoked. He has never used smokeless tobacco. He reports that he does not drink alcohol and does not use drugs.   ALLERGIES: Bee venom   MEDICATIONS:  Current Outpatient Medications  Medication Sig Dispense Refill  . acetaminophen (TYLENOL) 500 MG tablet Take 500 mg by mouth every 6 (six) hours as needed.    Marland Kitchen aspirin 81 MG tablet Take 81 mg by mouth daily.    Marland Kitchen atorvastatin (LIPITOR) 40 MG tablet Take 1 tablet (40 mg total) by mouth daily at 6 PM. 90 tablet 3  . clopidogrel (PLAVIX) 75 MG tablet Take 1 tablet (75 mg total) by mouth daily. Okay to restart this medication on 10/01/2019. 90 tablet 3  . gabapentin (NEURONTIN) 300 MG capsule Take 1 capsule (300 mg total) by mouth at bedtime. 90 capsule 3  . lisinopril (ZESTRIL) 10 MG tablet Take 1 tablet (10 mg total) by mouth daily. 90 tablet 3  . metFORMIN (GLUCOPHAGE) 500 MG tablet Take 2 tablets (1,000 mg total) by mouth 2 (two) times daily with a  meal. 360 tablet 3  . Omega-3 Fatty Acids (FISH OIL) 1200 MG CAPS Take by mouth daily.     No current facility-administered medications for this encounter.     REVIEW OF SYSTEMS:  A 15 point review of systems is documented in the electronic medical record. This was obtained by the nursing staff. However, I reviewed this with the patient to discuss relevant findings and make appropriate changes.  Pertinent items are noted in HPI.    PHYSICAL  EXAM:  height is 6\' 2"  (1.88 m) and weight is 215 lb (97.5 kg).   ECOG = 1  0 - Asymptomatic (Fully active, able to carry on all predisease activities without restriction)  1 - Symptomatic but completely ambulatory (Restricted in physically strenuous activity but ambulatory and able to carry out work of a light or sedentary nature. For example, light housework, office work)  2 - Symptomatic, <50% in bed during the day (Ambulatory and capable of all self care but unable to carry out any work activities. Up and about more than 50% of waking hours)  3 - Symptomatic, >50% in bed, but not bedbound (Capable of only limited self-care, confined to bed or chair 50% or more of waking hours)  4 - Bedbound (Completely disabled. Cannot carry on any self-care. Totally confined to bed or chair)  5 - Death   Micheal Oliver, Creech RH, Tormey DC, et al. [phone]). "Toxicity and response criteria of the [facility]". [place] Oncol. 5 (6): 649-55  Alert and oriented x3, no acute distress   LABORATORY DATA:  Lab Results  Component Value Date   WBC 10.6 (H) 09/24/2019   HGB 14.2 09/24/2019   HCT 41.6 09/24/2019   MCV 90.5 09/24/2019   PLT 218.0 09/24/2019   Lab Results  Component Value Date   NA 137 09/24/2019   K 4.7 09/24/2019   CL 105 09/24/2019   CO2 24 09/24/2019   Lab Results  Component Value Date   ALT 22 09/24/2019   AST 17 09/24/2019   ALKPHOS 86 09/24/2019   BILITOT 1.1 09/24/2019      RADIOGRAPHY: CT ANGIO HEAD W OR WO CONTRAST  Result Date: 09/19/2019 CLINICAL DATA:  Headache. Aphasia. Visual disturbance. Abnormal MRI. Acute nonhemorrhagic MCA territory infarct. EXAM: CT ANGIOGRAPHY HEAD AND NECK TECHNIQUE: Multidetector CT imaging of the head and neck was performed using the standard protocol during bolus administration of intravenous contrast. Multiplanar CT image reconstructions and MIPs were obtained to evaluate the vascular anatomy. Carotid stenosis  measurements (when applicable) are obtained utilizing NASCET criteria, using the distal internal carotid diameter as the denominator. CONTRAST:  49mL OMNIPAQUE IOHEXOL 350 MG/ML SOLN COMPARISON:  MR head without and with contrast 09/18/2019. CT head without contrast 09/18/2019. FINDINGS: CTA NECK FINDINGS Aortic arch: 3 vessel arch configuration is present. No significant vascular calcifications are present. No aneurysm or stenosis is present. Right carotid system: The right common carotid artery is within normal limits. Bifurcation is unremarkable. Mild tortuosity is present in the cervical right ICA without significant stenosis. Left carotid system: The left common carotid artery is within normal limits. Bifurcation is unremarkable. Mild tortuosity is present in the cervical left ICA without significant stenosis. Focal hypoattenuation in the mid cervical left ICA is artifactual. Vertebral arteries: The vertebral arteries are codominant. Both vertebral arteries originate from the subclavian arteries without significant stenosis. No significant stenosis is present in either vertebral artery in the neck. Skeleton: Vertebral body heights and alignment are maintained.  No focal lytic or blastic lesions are present. Other neck: A 1.9 cm hypo dense nodule is present in the left lobe of the thyroid. Salivary glands are within normal limits. No significant cervical adenopathy is present. Subcentimeter level 2 lymph nodes are present bilaterally. Upper chest: Right hilar and suprahilar mass lesion measures at least 5.3 x 4.0 x 4.8 cm. Precarinal lymph node measures 13 x 14 Oliver. Additional hilar adenopathy is present. No obstructive disease is evident in the imaged portions of the right lung. Left lung is clear. Thoracic inlet is normal. Review of the MIP images confirms the above findings CTA HEAD FINDINGS Anterior circulation: The internal carotid arteries are within normal limits from the skull base through the ICA termini.  The A1 and M1 segments are normal. The anterior communicating artery is patent. MCA bifurcations are intact bilaterally. MCA branch vessels are within normal limits bilaterally. ACA branch vessels are unremarkable. No significant proximal stenosis or occlusion is present. No aneurysm is present. Posterior circulation: The vertebral arteries are codominant. PICA origins are visualized and normal. Vertebral artery is normal. Both posterior cerebral arteries originate from the basilar tip. The PCA branch vessels are within normal limits. Luxury perfusion is noted in the area of the left parietal infarct. Venous sinuses: The dural sinuses are patent. The straight sinus deep cerebral veins are intact. The right transverse sinus is dominant. Cortical veins are unremarkable. No vascular malformation is evident. Anatomic variants: None Review of the MIP images confirms the above findings IMPRESSION: 1. No significant proximal stenosis, aneurysm, or branch vessel occlusion within the Circle of Willis. 2. Luxury perfusion in the area of the left parietal infarct. 3. Right hilar and suprahilar mass lesion measuring at least 5.3 x 4.0 x 4.8 cm. This is concerning for a primary lung neoplasm. Recommend CT of the chest with contrast for further evaluation. 4. Additional hilar adenopathy is concerning for metastatic disease. 5. 1.9 cm hypo dense nodule in the left lobe of the thyroid. Recommend thyroid US (ref: J Am Coll Radiol. 2015 Feb;12(2): 143-50). These results were called by telephone at the time of interpretation on 09/19/2019 at 1:38 pm to provider Avon Gully, who verbally acknowledged these results. Electronically Signed   By: San Morelle M.D.   On: 09/19/2019 13:38   CT HEAD WO CONTRAST  Result Date: 09/18/2019 CLINICAL DATA:  Altered mental status. EXAM: CT HEAD WITHOUT CONTRAST TECHNIQUE: Contiguous axial images were obtained from the base of the skull through the vertex without intravenous contrast.  COMPARISON:  None. FINDINGS: Brain: There is mild cerebral atrophy with widening of the extra-axial spaces and ventricular dilatation. There are areas of decreased attenuation within the white matter tracts of the supratentorial brain, consistent with microvascular disease changes. A small to moderate sized area of cortical and white matter low attenuation is seen within the parietooccipital region on the left (axial CT images 14 through 19, CT series number 2). An area of isodense cortex is seen within the periphery of this region (axial CT image 16, CT series number 2). Very mild sulcal effacement is noted. There is no evidence of associated midline shift. Vascular: No hyperdense vessel or unexpected calcification. Skull: Normal. Negative for fracture or focal lesion. Sinuses/Orbits: No acute finding. Other: None. IMPRESSION: 1. Small to moderate-sized area of cortical and white matter low attenuation within the parietooccipital region on the left, with a small area of isodense cortex seen within the periphery of this region. This is consistent with a subacute infarct. MRI correlation  is recommended. 2. No evidence of associated midline shift. Electronically Signed   By: Virgina Norfolk M.D.   On: 09/18/2019 15:20   CT ANGIO NECK W OR WO CONTRAST  Result Date: 09/19/2019 CLINICAL DATA:  Headache. Aphasia. Visual disturbance. Abnormal MRI. Acute nonhemorrhagic MCA territory infarct. EXAM: CT ANGIOGRAPHY HEAD AND NECK TECHNIQUE: Multidetector CT imaging of the head and neck was performed using the standard protocol during bolus administration of intravenous contrast. Multiplanar CT image reconstructions and MIPs were obtained to evaluate the vascular anatomy. Carotid stenosis measurements (when applicable) are obtained utilizing NASCET criteria, using the distal internal carotid diameter as the denominator. CONTRAST:  93mL OMNIPAQUE IOHEXOL 350 MG/ML SOLN COMPARISON:  MR head without and with contrast  09/18/2019. CT head without contrast 09/18/2019. FINDINGS: CTA NECK FINDINGS Aortic arch: 3 vessel arch configuration is present. No significant vascular calcifications are present. No aneurysm or stenosis is present. Right carotid system: The right common carotid artery is within normal limits. Bifurcation is unremarkable. Mild tortuosity is present in the cervical right ICA without significant stenosis. Left carotid system: The left common carotid artery is within normal limits. Bifurcation is unremarkable. Mild tortuosity is present in the cervical left ICA without significant stenosis. Focal hypoattenuation in the mid cervical left ICA is artifactual. Vertebral arteries: The vertebral arteries are codominant. Both vertebral arteries originate from the subclavian arteries without significant stenosis. No significant stenosis is present in either vertebral artery in the neck. Skeleton: Vertebral body heights and alignment are maintained. No focal lytic or blastic lesions are present. Other neck: A 1.9 cm hypo dense nodule is present in the left lobe of the thyroid. Salivary glands are within normal limits. No significant cervical adenopathy is present. Subcentimeter level 2 lymph nodes are present bilaterally. Upper chest: Right hilar and suprahilar mass lesion measures at least 5.3 x 4.0 x 4.8 cm. Precarinal lymph node measures 13 x 14 Oliver. Additional hilar adenopathy is present. No obstructive disease is evident in the imaged portions of the right lung. Left lung is clear. Thoracic inlet is normal. Review of the MIP images confirms the above findings CTA HEAD FINDINGS Anterior circulation: The internal carotid arteries are within normal limits from the skull base through the ICA termini. The A1 and M1 segments are normal. The anterior communicating artery is patent. MCA bifurcations are intact bilaterally. MCA branch vessels are within normal limits bilaterally. ACA branch vessels are unremarkable. No significant  proximal stenosis or occlusion is present. No aneurysm is present. Posterior circulation: The vertebral arteries are codominant. PICA origins are visualized and normal. Vertebral artery is normal. Both posterior cerebral arteries originate from the basilar tip. The PCA branch vessels are within normal limits. Luxury perfusion is noted in the area of the left parietal infarct. Venous sinuses: The dural sinuses are patent. The straight sinus deep cerebral veins are intact. The right transverse sinus is dominant. Cortical veins are unremarkable. No vascular malformation is evident. Anatomic variants: None Review of the MIP images confirms the above findings IMPRESSION: 1. No significant proximal stenosis, aneurysm, or branch vessel occlusion within the Circle of Willis. 2. Luxury perfusion in the area of the left parietal infarct. 3. Right hilar and suprahilar mass lesion measuring at least 5.3 x 4.0 x 4.8 cm. This is concerning for a primary lung neoplasm. Recommend CT of the chest with contrast for further evaluation. 4. Additional hilar adenopathy is concerning for metastatic disease. 5. 1.9 cm hypo dense nodule in the left lobe of the thyroid. Recommend  thyroid US (ref: J Am Coll Radiol. 2015 Feb;12(2): 143-50). These results were called by telephone at the time of interpretation on 09/19/2019 at 1:38 pm to provider Avon Gully, who verbally acknowledged these results. Electronically Signed   By: San Morelle M.D.   On: 09/19/2019 13:38   CT CHEST WO CONTRAST  Result Date: 09/19/2019 CLINICAL DATA:  Lung nodule. EXAM: CT CHEST WITHOUT CONTRAST TECHNIQUE: Multidetector CT imaging of the chest was performed following the standard protocol without IV contrast. COMPARISON:  CT urogram 11/24/2018 FINDINGS: Cardiovascular: Heart is normal size. Thoracic aorta is unremarkable without evidence of aneurysm. Remaining vascular structures are unremarkable. Mediastinum/Nodes: 1.1 cm precarinal lymph node just right of  midline. Subcarinal lymph node measuring 1.6 cm by short axis. Anterior superior right hilar adenopathy. Remaining mediastinal structures are unremarkable. Lungs/Pleura: There is a mass over the paramediastinal region of the right upper lobe with irregular lateral border as this mass measures 4.2 x 6.1 x 5.2 cm in transverse, AP and craniocaudal dimensions. This mass extends to the right suprahilar region likely contiguous with adjacent hilar adenopathy. This mass obstructs the associated bronchus over the right upper lobe as there is mild volume loss of the right upper lobe. 3 Oliver nodule over the right upper lobe lateral to this mass. Multiple other subcentimeter nodules within the right lung with the largest measuring approximately 4-5 Oliver over the right lower lobe. Calcified granuloma over the left lower lobe. Several other smaller subcentimeter noncalcified left lung nodules with the largest measuring 5-6 Oliver over the lateral left lower lobe. No evidence of effusion. Upper Abdomen: Diverticulosis of the colon. Contrast present over the right renal collecting system from patient's recent CT a head and neck. Musculoskeletal: Minimal degenerate change of the spine. IMPRESSION: 1. 6.1 cm right upper lobe mass extending to the right suprahilar region with associated right hilar and mediastinal adenopathy. This mass obstructs the associated upper lobe bronchus with volume loss of the right upper lobe. Findings are highly suspicious for primary bronchogenic carcinoma. Multiple subcentimeter bilateral pulmonary nodules as described which may be due to metastatic disease. Recommend referral to [facility]). 2.  Diverticulosis of the colon. Electronically Signed   By: Marin Olp M.D.   On: 09/19/2019 16:40   MR BRAIN W WO CONTRAST  Result Date: 09/18/2019 CLINICAL DATA:  Headache for 3 days. Aphasia in visual disturbance today. EXAM: MRI HEAD WITHOUT AND WITH CONTRAST TECHNIQUE:  Multiplanar, multiecho pulse sequences of the brain and surrounding structures were obtained without and with intravenous contrast. CONTRAST:  28mL GADAVIST GADOBUTROL 1 MMOL/ML IV SOLN COMPARISON:  Head CT 09/18/2019 FINDINGS: Brain: There is a 4 cm acute posterior left MCA territory infarct near the junction of the temporal, occipital, and parietal lobes. Immediately posterior to this in the lateral aspect of the left occipital lobe is a 2.5 cm region of signal abnormality most compatible with a subacute infarct with a small amount of petechial blood products. There are also subcentimeter foci of mild trace diffusion weighted signal hyperintensity, normal ADC, and mild T2 hyperintensity in the right occipital lobe and both cerebellar hemispheres likely reflecting subacute infarcts. There is associated enhancement of the infarcts in both occipital lobes and right cerebellum. T2 hyperintensities elsewhere in the cerebral white matter bilaterally are nonspecific but compatible with mild chronic small vessel ischemic disease. The ventricles are normal in size. No mass, midline shift, or extra-axial fluid collection is identified. Vascular: Major intracranial vascular flow voids are preserved. Skull and upper  cervical spine: Unremarkable bone marrow signal. Sinuses/Orbits: Unremarkable orbits. Clear paranasal sinuses. Small right mastoid effusion. Other: None. IMPRESSION: 1. 4 cm acute posterior left MCA territory infarct. 2. Small subacute bilateral occipital and cerebellar infarcts. 3. Mild chronic small vessel ischemic disease. Electronically Signed   By: Logan Bores M.D.   On: 09/18/2019 19:12   ECHOCARDIOGRAM COMPLETE  Result Date: 09/19/2019    ECHOCARDIOGRAM REPORT   Patient Name:   NALIN MAZZOCCO Date of Exam: 09/19/2019 Medical Rec #:  [id number]       Height:       74.0 in Accession #:    [id number]      Weight:       214.9 lb Date of Birth:  1950/07/07       BSA:          2.241 m Patient Age:    51 years         BP:           149/91 mmHg Patient Gender: M               HR:           61 bpm. Exam Location:  Inpatient Procedure: 2D Echo Indications:    stroke 434.91  History:        Patient has no prior history of Echocardiogram examinations.                 Risk Factors:Hypertension, Dyslipidemia, Diabetes and Family                 History of Coronary Artery Disease.  Sonographer:    Johny Chess Referring Phys: [id number]  1. Left ventricular ejection fraction, by estimation, is 60 to 65%. The left ventricle has normal function. The left ventricle has no regional wall motion abnormalities. There is moderate concentric left ventricular hypertrophy. Left ventricular diastolic parameters are consistent with Grade I diastolic dysfunction (impaired relaxation).  2. Right ventricular systolic function is normal. The right ventricular size is normal.  3. Left atrial size was mildly dilated.  4. The mitral valve is normal in structure. Mild mitral valve regurgitation. No evidence of mitral stenosis.  5. The aortic valve is normal in structure. Aortic valve regurgitation is trivial. No aortic stenosis is present.  6. The inferior vena cava is normal in size with greater than 50% respiratory variability, suggesting right atrial pressure of 3 mmHg. Conclusion(s)/Recommendation(s): No intracardiac source of embolism detected on this transthoracic study. A transesophageal echocardiogram is recommended to exclude cardiac source of embolism if clinically indicated. FINDINGS  Left Ventricle: Left ventricular ejection fraction, by estimation, is 60 to 65%. The left ventricle has normal function. The left ventricle has no regional wall motion abnormalities. The left ventricular internal cavity size was normal in size. There is  moderate concentric left ventricular hypertrophy. Left ventricular diastolic parameters are consistent with Grade I diastolic dysfunction (impaired relaxation). Normal left  ventricular filling pressure. Right Ventricle: The right ventricular size is normal. No increase in right ventricular wall thickness. Right ventricular systolic function is normal. Left Atrium: Left atrial size was mildly dilated. Right Atrium: Right atrial size was normal in size. Pericardium: There is no evidence of pericardial effusion. Mitral Valve: The mitral valve is normal in structure. Normal mobility of the mitral valve leaflets. Mild mitral valve regurgitation. No evidence of mitral valve stenosis. Tricuspid Valve: The tricuspid valve is normal in structure. Tricuspid valve regurgitation is trivial. No evidence of tricuspid  stenosis. Aortic Valve: The aortic valve is normal in structure. Aortic valve regurgitation is trivial. No aortic stenosis is present. Pulmonic Valve: The pulmonic valve was normal in structure. Pulmonic valve regurgitation is trivial. No evidence of pulmonic stenosis. Aorta: The aortic root is normal in size and structure. Venous: The inferior vena cava is normal in size with greater than 50% respiratory variability, suggesting right atrial pressure of 3 mmHg. IAS/Shunts: No atrial level shunt detected by color flow Doppler.  LEFT VENTRICLE PLAX 2D LVIDd:         5.20 cm  Diastology LVIDs:         3.30 cm  LV e' lateral:   12.40 cm/s LV PW:         1.20 cm  LV E/e' lateral: 4.5 LV IVS:        1.30 cm  LV e' medial:    6.09 cm/s LVOT diam:     2.30 cm  LV E/e' medial:  9.1 LV SV:         68 LV SV Index:   30 LVOT Area:     4.15 cm  RIGHT VENTRICLE RV S prime:     12.60 cm/s TAPSE (M-mode): 2.1 cm LEFT ATRIUM             Index       RIGHT ATRIUM           Index LA diam:        4.40 cm 1.96 cm/m  RA Area:     17.60 cm LA Vol (A2C):   52.3 ml 23.34 ml/m RA Volume:   46.20 ml  20.62 ml/m LA Vol (A4C):   40.3 ml 17.98 ml/m LA Biplane Vol: 48.4 ml 21.60 ml/m  AORTIC VALVE LVOT Vmax:   90.10 cm/s LVOT Vmean:  55.300 cm/s LVOT VTI:    0.163 m  AORTA Ao Root diam: 4.00 cm Ao Asc diam:   3.70 cm MITRAL VALVE MV Area (PHT): 2.22 cm    SHUNTS MV Decel Time: 342 msec    Systemic VTI:  0.16 m MV E velocity: 55.70 cm/s  Systemic Diam: 2.30 cm MV A velocity: 53.10 cm/s MV E/A ratio:  1.05 Ena Dawley MD Electronically signed by Ena Dawley MD Signature Date/Time: 09/19/2019/1:17:21 PM    Final    US THYROID  Result Date: 09/20/2019 CLINICAL DATA:  69 year old male with left thyroid nodule EXAM: THYROID ULTRASOUND TECHNIQUE: Ultrasound examination of the thyroid gland and adjacent soft tissues was performed. COMPARISON:  Chest CT 09/19/2019 FINDINGS: Parenchymal Echotexture: Normal Isthmus: 0.5 cm Right lobe: 4.2 cm x 1.5 cm x 1.5 cm Left lobe: 4.3 cm x 3.1 cm x 1.7 cm _________________________________________________________ Estimated total number of nodules >/= 1 cm: 1 Number of spongiform nodules >/=  2 cm not described below (TR1): 0 Number of mixed cystic and solid nodules >/= 1.5 cm not described below (TR2): 0 _________________________________________________________ Nodule # 1: Location: Right; Mid Maximum size: 0.7 cm; Other 2 dimensions: 0.7 cm x 0.7 cm Composition: solid/almost completely solid (2) Echogenicity: hypoechoic (2) Shape: not taller-than-wide (0) Margins: ill-defined (0) Echogenic foci: punctate echogenic foci (3) ACR TI-RADS total points: 7. ACR TI-RADS risk category: TR5 (>/= 7 points). ACR TI-RADS recommendations: Nodule meets criteria for surveillance _________________________________________________________ Nodule # 2: Location: Left; Superior Maximum size: 2.4 cm; Other 2 dimensions: 1.6 cm x 1.7 cm Composition: solid/almost completely solid (2) Echogenicity: hypoechoic (2) Shape: not taller-than-wide (0) Margins: ill-defined (0) Echogenic foci: none (0) ACR  TI-RADS total points: 4. ACR TI-RADS risk category: TR4 (4-6 points). ACR TI-RADS recommendations: Nodule meets criteria for biopsy _________________________________________________________ No adenopathy  IMPRESSION: Left superior thyroid nodule (labeled 2, 2.4 cm) meets criteria for biopsy, as designated by the newly established ACR TI-RADS criteria, and referral for biopsy is recommended. Right mid thyroid nodule (labeled 1, 0.7 cm, TR 5) meets criteria for surveillance, as designated by the newly established ACR TI-RADS criteria. Surveillance ultrasound study recommended to be performed annually up to 5 years. Recommendations follow those established by the new ACR TI-RADS criteria (J Am Coll Radiol 8185;90:931-121). Electronically Signed   By: Corrie Mckusick D.O.   On: 09/20/2019 12:00       IMPRESSION/ PLAN:  The patient has a recent diagnosis of non-small cell lung cancer.  Current staging corresponds to stage III disease with PET scan pending.  The patient has a 6.1 cm right sided tumor with associated mediastinal lymphadenopathy and also has multiple subcentimeter bilateral pulmonary nodules of uncertain significance at this time.  He has a PET scan pending and also sees medical oncology next week.  I discussed with him the potential role of radiation treatment in this setting.  We discussed definitive radiation treatment in conjunction with chemotherapy as well as potential palliative roles as well.  All of his questions were answered and he is eager to proceed with a treatment plan as soon as we obtain additional information.  We anticipate proceeding with possible simulation in the near future, pending his upcoming additional imaging.  The patient is encouraged to contact our office if we can be of any further assistance in the meantime.  Given current concerns for patient exposure during the COVID-19 pandemic, this encounter was conducted via telephone.  The patient has given verbal consent for this type of encounter. The time spent on this encounter today was 50 minutes including review of medical records, discussion with the patient, and coordination of care.  The attendants for this meeting  included Dr. Lisbeth Renshaw, and the patient, and the patient's wife.  During the encounter Dr. Lisbeth Renshaw was located at [facility] Radiation Oncology Department.  The patient  was located at home.        ________________________________   Jodelle Gross, MD, PhD   **Disclaimer: This note was dictated with voice recognition software. Similar sounding words can inadvertently be transcribed and this note may contain transcription errors which may not have been corrected upon publication of note.**

## 2019-10-13 ENCOUNTER — Telehealth: Payer: Self-pay | Admitting: *Deleted

## 2019-10-13 NOTE — Telephone Encounter (Signed)
Foundation one and PDL 1 requested

## 2019-10-14 ENCOUNTER — Other Ambulatory Visit: Payer: Self-pay | Admitting: Family Medicine

## 2019-10-14 DIAGNOSIS — E119 Type 2 diabetes mellitus without complications: Secondary | ICD-10-CM

## 2019-10-14 DIAGNOSIS — R69 Illness, unspecified: Secondary | ICD-10-CM | POA: Diagnosis not present

## 2019-10-15 ENCOUNTER — Encounter: Payer: Self-pay | Admitting: Internal Medicine

## 2019-10-15 ENCOUNTER — Telehealth: Payer: Self-pay | Admitting: Internal Medicine

## 2019-10-15 ENCOUNTER — Other Ambulatory Visit: Payer: Self-pay

## 2019-10-15 ENCOUNTER — Other Ambulatory Visit: Payer: Self-pay | Admitting: *Deleted

## 2019-10-15 ENCOUNTER — Inpatient Hospital Stay: Payer: Medicare HMO

## 2019-10-15 ENCOUNTER — Encounter: Payer: Self-pay | Admitting: *Deleted

## 2019-10-15 ENCOUNTER — Inpatient Hospital Stay: Payer: Medicare HMO | Attending: Internal Medicine | Admitting: Internal Medicine

## 2019-10-15 VITALS — BP 151/102 | HR 85 | Temp 97.5°F | Resp 19 | Ht 74.0 in | Wt 211.7 lb

## 2019-10-15 DIAGNOSIS — C3490 Malignant neoplasm of unspecified part of unspecified bronchus or lung: Secondary | ICD-10-CM

## 2019-10-15 DIAGNOSIS — Z79899 Other long term (current) drug therapy: Secondary | ICD-10-CM | POA: Insufficient documentation

## 2019-10-15 DIAGNOSIS — R590 Localized enlarged lymph nodes: Secondary | ICD-10-CM | POA: Diagnosis not present

## 2019-10-15 DIAGNOSIS — C3411 Malignant neoplasm of upper lobe, right bronchus or lung: Secondary | ICD-10-CM

## 2019-10-15 DIAGNOSIS — R918 Other nonspecific abnormal finding of lung field: Secondary | ICD-10-CM

## 2019-10-15 DIAGNOSIS — Z7189 Other specified counseling: Secondary | ICD-10-CM

## 2019-10-15 LAB — CMP (CANCER CENTER ONLY)
ALT: 21 U/L (ref 0–44)
AST: 15 U/L (ref 15–41)
Albumin: 3.7 g/dL (ref 3.5–5.0)
Alkaline Phosphatase: 84 U/L (ref 38–126)
Anion gap: 10 (ref 5–15)
BUN: 13 mg/dL (ref 8–23)
CO2: 23 mmol/L (ref 22–32)
Calcium: 9.4 mg/dL (ref 8.9–10.3)
Chloride: 109 mmol/L (ref 98–111)
Creatinine: 1.17 mg/dL (ref 0.61–1.24)
GFR, Est AFR Am: >60 mL/min (ref >60)
GFR, Estimated: >60 mL/min (ref >60)
Glucose, Bld: 181 mg/dL — ABNORMAL HIGH (ref 70–99)
Potassium: 4.3 mmol/L (ref 3.5–5.1)
Sodium: 142 mmol/L (ref 135–145)
Total Bilirubin: 1 mg/dL (ref 0.3–1.2)
Total Protein: 7 g/dL (ref 6.5–8.1)

## 2019-10-15 LAB — CBC WITH DIFFERENTIAL (CANCER CENTER ONLY)
Abs Immature Granulocytes: 0.05 10*3/uL (ref 0.00–0.07)
Basophils Absolute: 0.1 10*3/uL (ref 0.0–0.1)
Basophils Relative: 1 %
Eosinophils Absolute: 0.3 10*3/uL (ref 0.0–0.5)
Eosinophils Relative: 3 %
HCT: 38.6 % — ABNORMAL LOW (ref 39.0–52.0)
Hemoglobin: 12.9 g/dL — ABNORMAL LOW (ref 13.0–17.0)
Immature Granulocytes: 1 %
Lymphocytes Relative: 21 %
Lymphs Abs: 1.9 10*3/uL (ref 0.7–4.0)
MCH: 29.5 pg (ref 26.0–34.0)
MCHC: 33.4 g/dL (ref 30.0–36.0)
MCV: 88.3 fL (ref 80.0–100.0)
Monocytes Absolute: 0.7 10*3/uL (ref 0.1–1.0)
Monocytes Relative: 8 %
Neutro Abs: 6 10*3/uL (ref 1.7–7.7)
Neutrophils Relative %: 67 %
Platelet Count: 193 10*3/uL (ref 150–400)
RBC: 4.37 MIL/uL (ref 4.22–5.81)
RDW: 12.9 % (ref 11.5–15.5)
WBC Count: 9.1 10*3/uL (ref 4.0–10.5)
nRBC: 0 % (ref 0.0–0.2)

## 2019-10-15 NOTE — Telephone Encounter (Signed)
Scheduled per 07/15 los, patient will be notified per My chart.

## 2019-10-15 NOTE — Progress Notes (Signed)
Oncology Nurse Navigator Documentation  Oncology Nurse Navigator Flowsheets 10/15/2019  Abnormal Finding Date 09/19/2019  Confirmed Diagnosis Date 10/30/2019  Diagnosis Status Pending Molecular Studies  Navigator Follow Up Date: 10/19/2019  Navigator Follow Up Reason: Appointment Review  Navigator Location [facility]  Navigator Encounter Type Clinic/MDC;Initial MedOnc/I spoke with Micheal Oliver today at his first visit with Dr. Julien Nordmann.  Micheal Oliver is newly dx lung cancer with treatment plan of PET scan then follow up.  PET scan order is completed, I will follow up auth coordinator for insurance prior auth.   [facility] Date 10/15/2019  Multidisiplinary Clinic Type Thoracic  Patient Visit Type Initial;MedOnc  Treatment Phase Pre-Tx/Tx Discussion  Barriers/Navigation Needs Education  Education Newly Diagnosed Cancer Education;Other  Interventions Education;Psycho-Social Support  Acuity Level 2-Minimal Needs (1-2 Barriers Identified)  Education Method Verbal;Written  Time Spent with Patient 30

## 2019-10-15 NOTE — Progress Notes (Signed)
[place] Telephone:([phone]) [phone]   [phone] Multidisciplinary thoracic oncology clinic CONSULT NOTE  REFERRING PHYSICIAN: Dr. Baltazar Apo  REASON FOR CONSULTATION:  69 years old white male recently diagnosed with lung cancer.  HPI Micheal Oliver is a 69 y.o. male with past medical history significant for diabetes mellitus, hypertension, kidney stone as well as dyslipidemia and recent strokes.  The patient mentioned that on 09/18/2019 he started having confusion with slurred speech as well as memory issues.  His wife encouraged him to go to the emergency department for evaluation.  He had CT scan of the head followed by MRI of the brain on 09/18/2019 and it showed 4.0 cm acute posterior left MCA territory infarct with small subacute bilateral occipital and cerebellar infarcts.  During his evaluation for the stroke he had CT angiogram of the neck and head.  Incidentally it showed right hilar and suprahilar mass lesion measuring at least 5.3 x 4.0 x 4.8 cm concerning for primary lung neoplasm.  There was also 1.9 cm hypodense nodule in the left lobe of the thyroid.  The patient had CT scan of the chest on 09/19/2019 and it showed 1.1 cm precarinal lymph node just right of the midline with subcarinal lymph node measuring 1.6 cm in short axis and anterior superior right hilar adenopathy.  The scan also showed a mass over the paramediastinal region of the right upper lobe with irregular lateral border measured 4.2 x 6.1 x 5.2 cm.  The mass extends to the right suprahilar region and likely contagious with adjacent hilar adenopathy.  This mass obstructs the associated branches over the right upper lobe as there mild volume loss of the right upper lobe.  There was also a 0.3 cm nodule over the right upper lobe lateral to this mass and multiple other subcentimeter nodules within the right lung with the largest measuring 4-5 mm over the right lower lobe.  There was also several other  smaller subcentimeter noncalcified left lung nodules with the largest measuring 5-6 mm over the lateral left lower lobe.  The patient was referred to Dr. Lamonte Sakai and on September 30, 2019 he underwent video bronchoscopy with endobronchial ultrasound procedure. The final cytology ([id number]) from the fine-needle aspiration of the right upper lobe as well as the right upper lobe brushing showed malignant cells consistent with non-small cell carcinoma. Immunohistochemistry reveals the cells are positive for TTF-1 and NapsinA. They are negative for cytokeratin 5/6 and p40. The immunoprofile is consistent with a lung adenocarcinoma. Dr. Lamonte Sakai kindly referred the patient to the multidisciplinary thoracic oncology clinic today for evaluation and recommendation regarding treatment of his condition.  The patient was also seen by Dr. Lisbeth Renshaw for consideration of radiotherapy to the obstructive right lung mass. When seen today the patient is feeling fine except for mild cough but no significant chest pain, shortness of breath or hemoptysis.  He lost around 6 pounds in the last few weeks.  He has some visual changes but no significant headache.  The patient denied having any nausea, vomiting, diarrhea or constipation. Family history significant for mother with stomach cancer.  Father had heart attacks and liver cirrhosis. The patient is married and has 2 children a son and daughter.  He also has 3 grandchildren.  His wife [name] was available by FaceTime during the visit.  The patient has no history of smoking but has exposure to secondhand smoking from his parents.  He has no history of alcohol or drug abuse. HPI  Past Medical History:  Diagnosis Date   Chicken pox    DM2 (diabetes mellitus, type 2) ([place])    History of kidney stones    passed several    Hypertension    Kidney stones    5-6 times   Measles    Mumps    Stroke ([place])    x 2, last one was 09/18/2019- has some expressive aphasia    Past  Surgical History:  Procedure Laterality Date   COLONOSCOPY W/ POLYPECTOMY     VIDEO BRONCHOSCOPY WITH ENDOBRONCHIAL ULTRASOUND N/A 09/30/2019   Procedure: VIDEO BRONCHOSCOPY WITH ENDOBRONCHIAL ULTRASOUND;  Surgeon: Collene Gobble, MD;  Location: [facility] OR;  Service: Thoracic;  Laterality: N/A;   WISDOM TOOTH EXTRACTION  1976-77    Family History  Problem Relation Age of Onset   Alcohol abuse Father 60       Deceased   Heart disease Father    Lung disease Father    Hypertension Father    Stomach cancer Mother 14       Deceased   Hypertension Paternal Grandmother    Dementia Paternal Grandmother    Stroke Paternal Grandmother    Arthritis Maternal Grandmother    Sudden death Paternal Grandfather    Sudden death Maternal Grandfather    Heart attack Paternal Aunt    Other Maternal Aunt        Natural Causes   Healthy Son    Anxiety disorder Daughter     Social History Social History   Tobacco Use   Smoking status: Never Smoker   Smokeless tobacco: Never Used  Scientific laboratory technician Use: Never used  Substance Use Topics   Alcohol use: No   Drug use: No    Allergies  Allergen Reactions   Bee Venom     Lip swelling    Current Outpatient Medications  Medication Sig Dispense Refill   acetaminophen (TYLENOL) 500 MG tablet Take 500 mg by mouth every 6 (six) hours as needed.     aspirin 81 MG tablet Take 81 mg by mouth daily.     atorvastatin (LIPITOR) 40 MG tablet Take 1 tablet (40 mg total) by mouth daily at 6 PM. 90 tablet 3   clopidogrel (PLAVIX) 75 MG tablet Take 1 tablet (75 mg total) by mouth daily. Okay to restart this medication on 10/01/2019. 90 tablet 3   gabapentin (NEURONTIN) 300 MG capsule Take 1 capsule (300 mg total) by mouth at bedtime. 90 capsule 3   lisinopril (ZESTRIL) 10 MG tablet Take 1 tablet (10 mg total) by mouth daily. 90 tablet 3   metFORMIN (GLUCOPHAGE) 500 MG tablet TAKE TWO TABLETS BY MOUTH TWICE A DAY WITH MEAL(S) 360  tablet 3   Omega-3 Fatty Acids (FISH OIL) 1200 MG CAPS Take by mouth daily.     No current facility-administered medications for this visit.    Review of Systems  Constitutional: positive for fatigue and weight loss Eyes: negative Ears, nose, mouth, throat, and face: negative Respiratory: positive for cough Cardiovascular: negative Gastrointestinal: negative Genitourinary:negative Integument/breast: negative Hematologic/lymphatic: negative Musculoskeletal:negative Neurological: negative Behavioral/Psych: negative Endocrine: negative Allergic/Immunologic: negative  Physical Exam  LNL:GXQJJ, healthy, no distress, well nourished, well developed and anxious SKIN: skin color, texture, turgor are normal, no rashes or significant lesions HEAD: Normocephalic, No masses, lesions, tenderness or abnormalities EYES: normal, PERRLA EARS: External ears normal, Canals clear OROPHARYNX:no exudate, no erythema and lips, buccal mucosa, and tongue normal  NECK: supple, no adenopathy, no JVD LYMPH:  no palpable lymphadenopathy, no hepatosplenomegaly BREAST:not examined LUNGS: clear to auscultation , and palpation HEART: regular rate & rhythm, no murmurs and no gallops ABDOMEN:abdomen soft, non-tender, normal bowel sounds and no masses or organomegaly BACK: Back symmetric, no curvature., No CVA tenderness EXTREMITIES:no joint deformities, effusion, or inflammation, no edema  NEURO: alert & oriented x 3 with fluent speech, no focal motor/sensory deficits  PERFORMANCE STATUS: ECOG 1  LABORATORY DATA: Lab Results  Component Value Date   WBC 9.1 10/15/2019   HGB 12.9 (L) 10/15/2019   HCT 38.6 (L) 10/15/2019   MCV 88.3 10/15/2019   PLT 193 10/15/2019      Chemistry      Component Value Date/Time   NA 137 09/24/2019 1203   K 4.7 09/24/2019 1203   CL 105 09/24/2019 1203   CO2 24 09/24/2019 1203   BUN 17 09/24/2019 1203   CREATININE 1.17 09/24/2019 1203   CREATININE 1.13 10/16/2013  1118      Component Value Date/Time   CALCIUM 9.9 09/24/2019 1203   ALKPHOS 86 09/24/2019 1203   AST 17 09/24/2019 1203   ALT 22 09/24/2019 1203   BILITOT 1.1 09/24/2019 1203       RADIOGRAPHIC STUDIES: CT ANGIO HEAD W OR WO CONTRAST  Result Date: 09/19/2019 CLINICAL DATA:  Headache. Aphasia. Visual disturbance. Abnormal MRI. Acute nonhemorrhagic MCA territory infarct. EXAM: CT ANGIOGRAPHY HEAD AND NECK TECHNIQUE: Multidetector CT imaging of the head and neck was performed using the standard protocol during bolus administration of intravenous contrast. Multiplanar CT image reconstructions and MIPs were obtained to evaluate the vascular anatomy. Carotid stenosis measurements (when applicable) are obtained utilizing NASCET criteria, using the distal internal carotid diameter as the denominator. CONTRAST:  73m OMNIPAQUE IOHEXOL 350 MG/ML SOLN COMPARISON:  MR head without and with contrast 09/18/2019. CT head without contrast 09/18/2019. FINDINGS: CTA NECK FINDINGS Aortic arch: 3 vessel arch configuration is present. No significant vascular calcifications are present. No aneurysm or stenosis is present. Right carotid system: The right common carotid artery is within normal limits. Bifurcation is unremarkable. Mild tortuosity is present in the cervical right ICA without significant stenosis. Left carotid system: The left common carotid artery is within normal limits. Bifurcation is unremarkable. Mild tortuosity is present in the cervical left ICA without significant stenosis. Focal hypoattenuation in the mid cervical left ICA is artifactual. Vertebral arteries: The vertebral arteries are codominant. Both vertebral arteries originate from the subclavian arteries without significant stenosis. No significant stenosis is present in either vertebral artery in the neck. Skeleton: Vertebral body heights and alignment are maintained. No focal lytic or blastic lesions are present. Other neck: A 1.9 cm hypo dense  nodule is present in the left lobe of the thyroid. Salivary glands are within normal limits. No significant cervical adenopathy is present. Subcentimeter level 2 lymph nodes are present bilaterally. Upper chest: Right hilar and suprahilar mass lesion measures at least 5.3 x 4.0 x 4.8 cm. Precarinal lymph node measures 13 x 14 mm. Additional hilar adenopathy is present. No obstructive disease is evident in the imaged portions of the right lung. Left lung is clear. Thoracic inlet is normal. Review of the MIP images confirms the above findings CTA HEAD FINDINGS Anterior circulation: The internal carotid arteries are within normal limits from the skull base through the ICA termini. The A1 and M1 segments are normal. The anterior communicating artery is patent. MCA bifurcations are intact bilaterally. MCA branch vessels are within normal limits bilaterally. ACA branch vessels are unremarkable. No  significant proximal stenosis or occlusion is present. No aneurysm is present. Posterior circulation: The vertebral arteries are codominant. PICA origins are visualized and normal. Vertebral artery is normal. Both posterior cerebral arteries originate from the basilar tip. The PCA branch vessels are within normal limits. Luxury perfusion is noted in the area of the left parietal infarct. Venous sinuses: The dural sinuses are patent. The straight sinus deep cerebral veins are intact. The right transverse sinus is dominant. Cortical veins are unremarkable. No vascular malformation is evident. Anatomic variants: None Review of the MIP images confirms the above findings IMPRESSION: 1. No significant proximal stenosis, aneurysm, or branch vessel occlusion within the Circle of Willis. 2. Luxury perfusion in the area of the left parietal infarct. 3. Right hilar and suprahilar mass lesion measuring at least 5.3 x 4.0 x 4.8 cm. This is concerning for a primary lung neoplasm. Recommend CT of the chest with contrast for further evaluation.  4. Additional hilar adenopathy is concerning for metastatic disease. 5. 1.9 cm hypo dense nodule in the left lobe of the thyroid. Recommend thyroid US (ref: J Am Coll Radiol. 2015 Feb;12(2): 143-50). These results were called by telephone at the time of interpretation on 09/19/2019 at 1:38 pm to provider Avon Gully, who verbally acknowledged these results. Electronically Signed   By: San Morelle M.D.   On: 09/19/2019 13:38   CT HEAD WO CONTRAST  Result Date: 09/18/2019 CLINICAL DATA:  Altered mental status. EXAM: CT HEAD WITHOUT CONTRAST TECHNIQUE: Contiguous axial images were obtained from the base of the skull through the vertex without intravenous contrast. COMPARISON:  None. FINDINGS: Brain: There is mild cerebral atrophy with widening of the extra-axial spaces and ventricular dilatation. There are areas of decreased attenuation within the white matter tracts of the supratentorial brain, consistent with microvascular disease changes. A small to moderate sized area of cortical and white matter low attenuation is seen within the parietooccipital region on the left (axial CT images 14 through 19, CT series number 2). An area of isodense cortex is seen within the periphery of this region (axial CT image 16, CT series number 2). Very mild sulcal effacement is noted. There is no evidence of associated midline shift. Vascular: No hyperdense vessel or unexpected calcification. Skull: Normal. Negative for fracture or focal lesion. Sinuses/Orbits: No acute finding. Other: None. IMPRESSION: 1. Small to moderate-sized area of cortical and white matter low attenuation within the parietooccipital region on the left, with a small area of isodense cortex seen within the periphery of this region. This is consistent with a subacute infarct. MRI correlation is recommended. 2. No evidence of associated midline shift. Electronically Signed   By: Virgina Norfolk M.D.   On: 09/18/2019 15:20   CT ANGIO NECK W OR WO  CONTRAST  Result Date: 09/19/2019 CLINICAL DATA:  Headache. Aphasia. Visual disturbance. Abnormal MRI. Acute nonhemorrhagic MCA territory infarct. EXAM: CT ANGIOGRAPHY HEAD AND NECK TECHNIQUE: Multidetector CT imaging of the head and neck was performed using the standard protocol during bolus administration of intravenous contrast. Multiplanar CT image reconstructions and MIPs were obtained to evaluate the vascular anatomy. Carotid stenosis measurements (when applicable) are obtained utilizing NASCET criteria, using the distal internal carotid diameter as the denominator. CONTRAST:  39m OMNIPAQUE IOHEXOL 350 MG/ML SOLN COMPARISON:  MR head without and with contrast 09/18/2019. CT head without contrast 09/18/2019. FINDINGS: CTA NECK FINDINGS Aortic arch: 3 vessel arch configuration is present. No significant vascular calcifications are present. No aneurysm or stenosis is present. Right carotid system:  The right common carotid artery is within normal limits. Bifurcation is unremarkable. Mild tortuosity is present in the cervical right ICA without significant stenosis. Left carotid system: The left common carotid artery is within normal limits. Bifurcation is unremarkable. Mild tortuosity is present in the cervical left ICA without significant stenosis. Focal hypoattenuation in the mid cervical left ICA is artifactual. Vertebral arteries: The vertebral arteries are codominant. Both vertebral arteries originate from the subclavian arteries without significant stenosis. No significant stenosis is present in either vertebral artery in the neck. Skeleton: Vertebral body heights and alignment are maintained. No focal lytic or blastic lesions are present. Other neck: A 1.9 cm hypo dense nodule is present in the left lobe of the thyroid. Salivary glands are within normal limits. No significant cervical adenopathy is present. Subcentimeter level 2 lymph nodes are present bilaterally. Upper chest: Right hilar and suprahilar  mass lesion measures at least 5.3 x 4.0 x 4.8 cm. Precarinal lymph node measures 13 x 14 mm. Additional hilar adenopathy is present. No obstructive disease is evident in the imaged portions of the right lung. Left lung is clear. Thoracic inlet is normal. Review of the MIP images confirms the above findings CTA HEAD FINDINGS Anterior circulation: The internal carotid arteries are within normal limits from the skull base through the ICA termini. The A1 and M1 segments are normal. The anterior communicating artery is patent. MCA bifurcations are intact bilaterally. MCA branch vessels are within normal limits bilaterally. ACA branch vessels are unremarkable. No significant proximal stenosis or occlusion is present. No aneurysm is present. Posterior circulation: The vertebral arteries are codominant. PICA origins are visualized and normal. Vertebral artery is normal. Both posterior cerebral arteries originate from the basilar tip. The PCA branch vessels are within normal limits. Luxury perfusion is noted in the area of the left parietal infarct. Venous sinuses: The dural sinuses are patent. The straight sinus deep cerebral veins are intact. The right transverse sinus is dominant. Cortical veins are unremarkable. No vascular malformation is evident. Anatomic variants: None Review of the MIP images confirms the above findings IMPRESSION: 1. No significant proximal stenosis, aneurysm, or branch vessel occlusion within the Circle of Willis. 2. Luxury perfusion in the area of the left parietal infarct. 3. Right hilar and suprahilar mass lesion measuring at least 5.3 x 4.0 x 4.8 cm. This is concerning for a primary lung neoplasm. Recommend CT of the chest with contrast for further evaluation. 4. Additional hilar adenopathy is concerning for metastatic disease. 5. 1.9 cm hypo dense nodule in the left lobe of the thyroid. Recommend thyroid US (ref: J Am Coll Radiol. 2015 Feb;12(2): 143-50). These results were called by telephone  at the time of interpretation on 09/19/2019 at 1:38 pm to provider Avon Gully, who verbally acknowledged these results. Electronically Signed   By: San Morelle M.D.   On: 09/19/2019 13:38   CT CHEST WO CONTRAST  Result Date: 09/19/2019 CLINICAL DATA:  Lung nodule. EXAM: CT CHEST WITHOUT CONTRAST TECHNIQUE: Multidetector CT imaging of the chest was performed following the standard protocol without IV contrast. COMPARISON:  CT urogram 11/24/2018 FINDINGS: Cardiovascular: Heart is normal size. Thoracic aorta is unremarkable without evidence of aneurysm. Remaining vascular structures are unremarkable. Mediastinum/Nodes: 1.1 cm precarinal lymph node just right of midline. Subcarinal lymph node measuring 1.6 cm by short axis. Anterior superior right hilar adenopathy. Remaining mediastinal structures are unremarkable. Lungs/Pleura: There is a mass over the paramediastinal region of the right upper lobe with irregular lateral border as this mass  measures 4.2 x 6.1 x 5.2 cm in transverse, AP and craniocaudal dimensions. This mass extends to the right suprahilar region likely contiguous with adjacent hilar adenopathy. This mass obstructs the associated bronchus over the right upper lobe as there is mild volume loss of the right upper lobe. 3 mm nodule over the right upper lobe lateral to this mass. Multiple other subcentimeter nodules within the right lung with the largest measuring approximately 4-5 mm over the right lower lobe. Calcified granuloma over the left lower lobe. Several other smaller subcentimeter noncalcified left lung nodules with the largest measuring 5-6 mm over the lateral left lower lobe. No evidence of effusion. Upper Abdomen: Diverticulosis of the colon. Contrast present over the right renal collecting system from patient's recent CT a head and neck. Musculoskeletal: Minimal degenerate change of the spine. IMPRESSION: 1. 6.1 cm right upper lobe mass extending to the right suprahilar region with  associated right hilar and mediastinal adenopathy. This mass obstructs the associated upper lobe bronchus with volume loss of the right upper lobe. Findings are highly suspicious for primary bronchogenic carcinoma. Multiple subcentimeter bilateral pulmonary nodules as described which may be due to metastatic disease. Recommend referral to [facility]). 2.  Diverticulosis of the colon. Electronically Signed   By: Marin Olp M.D.   On: 09/19/2019 16:40   MR BRAIN W WO CONTRAST  Result Date: 09/18/2019 CLINICAL DATA:  Headache for 3 days. Aphasia in visual disturbance today. EXAM: MRI HEAD WITHOUT AND WITH CONTRAST TECHNIQUE: Multiplanar, multiecho pulse sequences of the brain and surrounding structures were obtained without and with intravenous contrast. CONTRAST:  70m GADAVIST GADOBUTROL 1 MMOL/ML IV SOLN COMPARISON:  Head CT 09/18/2019 FINDINGS: Brain: There is a 4 cm acute posterior left MCA territory infarct near the junction of the temporal, occipital, and parietal lobes. Immediately posterior to this in the lateral aspect of the left occipital lobe is a 2.5 cm region of signal abnormality most compatible with a subacute infarct with a small amount of petechial blood products. There are also subcentimeter foci of mild trace diffusion weighted signal hyperintensity, normal ADC, and mild T2 hyperintensity in the right occipital lobe and both cerebellar hemispheres likely reflecting subacute infarcts. There is associated enhancement of the infarcts in both occipital lobes and right cerebellum. T2 hyperintensities elsewhere in the cerebral white matter bilaterally are nonspecific but compatible with mild chronic small vessel ischemic disease. The ventricles are normal in size. No mass, midline shift, or extra-axial fluid collection is identified. Vascular: Major intracranial vascular flow voids are preserved. Skull and upper cervical spine: Unremarkable bone marrow signal.  Sinuses/Orbits: Unremarkable orbits. Clear paranasal sinuses. Small right mastoid effusion. Other: None. IMPRESSION: 1. 4 cm acute posterior left MCA territory infarct. 2. Small subacute bilateral occipital and cerebellar infarcts. 3. Mild chronic small vessel ischemic disease. Electronically Signed   By: ALogan BoresM.D.   On: 09/18/2019 19:12   ECHOCARDIOGRAM COMPLETE  Result Date: 09/19/2019    ECHOCARDIOGRAM REPORT   Patient Name:   WDYQUAN MINKSDate of Exam: 09/19/2019 Medical Rec #:  [id number]      Height:       74.0 in Accession #:    [id number]     Weight:       214.9 lb Date of Birth:  2September 20, 1952      BSA:          2.241 m Patient Age:    89years  BP:           149/91 mmHg Patient Gender: M               HR:           61 bpm. Exam Location:  Inpatient Procedure: 2D Echo Indications:    stroke 434.91  History:        Patient has no prior history of Echocardiogram examinations.                 Risk Factors:Hypertension, Dyslipidemia, Diabetes and Family                 History of Coronary Artery Disease.  Sonographer:    Johny Chess Referring Phys: [id number] Elizabeth  1. Left ventricular ejection fraction, by estimation, is 60 to 65%. The left ventricle has normal function. The left ventricle has no regional wall motion abnormalities. There is moderate concentric left ventricular hypertrophy. Left ventricular diastolic parameters are consistent with Grade I diastolic dysfunction (impaired relaxation).  2. Right ventricular systolic function is normal. The right ventricular size is normal.  3. Left atrial size was mildly dilated.  4. The mitral valve is normal in structure. Mild mitral valve regurgitation. No evidence of mitral stenosis.  5. The aortic valve is normal in structure. Aortic valve regurgitation is trivial. No aortic stenosis is present.  6. The inferior vena cava is normal in size with greater than 50% respiratory variability, suggesting right atrial  pressure of 3 mmHg. Conclusion(s)/Recommendation(s): No intracardiac source of embolism detected on this transthoracic study. A transesophageal echocardiogram is recommended to exclude cardiac source of embolism if clinically indicated. FINDINGS  Left Ventricle: Left ventricular ejection fraction, by estimation, is 60 to 65%. The left ventricle has normal function. The left ventricle has no regional wall motion abnormalities. The left ventricular internal cavity size was normal in size. There is  moderate concentric left ventricular hypertrophy. Left ventricular diastolic parameters are consistent with Grade I diastolic dysfunction (impaired relaxation). Normal left ventricular filling pressure. Right Ventricle: The right ventricular size is normal. No increase in right ventricular wall thickness. Right ventricular systolic function is normal. Left Atrium: Left atrial size was mildly dilated. Right Atrium: Right atrial size was normal in size. Pericardium: There is no evidence of pericardial effusion. Mitral Valve: The mitral valve is normal in structure. Normal mobility of the mitral valve leaflets. Mild mitral valve regurgitation. No evidence of mitral valve stenosis. Tricuspid Valve: The tricuspid valve is normal in structure. Tricuspid valve regurgitation is trivial. No evidence of tricuspid stenosis. Aortic Valve: The aortic valve is normal in structure. Aortic valve regurgitation is trivial. No aortic stenosis is present. Pulmonic Valve: The pulmonic valve was normal in structure. Pulmonic valve regurgitation is trivial. No evidence of pulmonic stenosis. Aorta: The aortic root is normal in size and structure. Venous: The inferior vena cava is normal in size with greater than 50% respiratory variability, suggesting right atrial pressure of 3 mmHg. IAS/Shunts: No atrial level shunt detected by color flow Doppler.  LEFT VENTRICLE PLAX 2D LVIDd:         5.20 cm  Diastology LVIDs:         3.30 cm  LV e' lateral:    12.40 cm/s LV PW:         1.20 cm  LV E/e' lateral: 4.5 LV IVS:        1.30 cm  LV e' medial:    6.09 cm/s LVOT diam:  2.30 cm  LV E/e' medial:  9.1 LV SV:         68 LV SV Index:   30 LVOT Area:     4.15 cm  RIGHT VENTRICLE RV S prime:     12.60 cm/s TAPSE (M-mode): 2.1 cm LEFT ATRIUM             Index       RIGHT ATRIUM           Index LA diam:        4.40 cm 1.96 cm/m  RA Area:     17.60 cm LA Vol (A2C):   52.3 ml 23.34 ml/m RA Volume:   46.20 ml  20.62 ml/m LA Vol (A4C):   40.3 ml 17.98 ml/m LA Biplane Vol: 48.4 ml 21.60 ml/m  AORTIC VALVE LVOT Vmax:   90.10 cm/s LVOT Vmean:  55.300 cm/s LVOT VTI:    0.163 m  AORTA Ao Root diam: 4.00 cm Ao Asc diam:  3.70 cm MITRAL VALVE MV Area (PHT): 2.22 cm    SHUNTS MV Decel Time: 342 msec    Systemic VTI:  0.16 m MV E velocity: 55.70 cm/s  Systemic Diam: 2.30 cm MV A velocity: 53.10 cm/s MV E/A ratio:  1.05 Ena Dawley MD Electronically signed by Ena Dawley MD Signature Date/Time: 09/19/2019/1:17:21 PM    Final    US THYROID  Result Date: 09/20/2019 CLINICAL DATA:  69 year old male with left thyroid nodule EXAM: THYROID ULTRASOUND TECHNIQUE: Ultrasound examination of the thyroid gland and adjacent soft tissues was performed. COMPARISON:  Chest CT 09/19/2019 FINDINGS: Parenchymal Echotexture: Normal Isthmus: 0.5 cm Right lobe: 4.2 cm x 1.5 cm x 1.5 cm Left lobe: 4.3 cm x 3.1 cm x 1.7 cm _________________________________________________________ Estimated total number of nodules >/= 1 cm: 1 Number of spongiform nodules >/=  2 cm not described below (TR1): 0 Number of mixed cystic and solid nodules >/= 1.5 cm not described below (TR2): 0 _________________________________________________________ Nodule # 1: Location: Right; Mid Maximum size: 0.7 cm; Other 2 dimensions: 0.7 cm x 0.7 cm Composition: solid/almost completely solid (2) Echogenicity: hypoechoic (2) Shape: not taller-than-wide (0) Margins: ill-defined (0) Echogenic foci: punctate echogenic foci  (3) ACR TI-RADS total points: 7. ACR TI-RADS risk category: TR5 (>/= 7 points). ACR TI-RADS recommendations: Nodule meets criteria for surveillance _________________________________________________________ Nodule # 2: Location: Left; Superior Maximum size: 2.4 cm; Other 2 dimensions: 1.6 cm x 1.7 cm Composition: solid/almost completely solid (2) Echogenicity: hypoechoic (2) Shape: not taller-than-wide (0) Margins: ill-defined (0) Echogenic foci: none (0) ACR TI-RADS total points: 4. ACR TI-RADS risk category: TR4 (4-6 points). ACR TI-RADS recommendations: Nodule meets criteria for biopsy _________________________________________________________ No adenopathy IMPRESSION: Left superior thyroid nodule (labeled 2, 2.4 cm) meets criteria for biopsy, as designated by the newly established ACR TI-RADS criteria, and referral for biopsy is recommended. Right mid thyroid nodule (labeled 1, 0.7 cm, TR 5) meets criteria for surveillance, as designated by the newly established ACR TI-RADS criteria. Surveillance ultrasound study recommended to be performed annually up to 5 years. Recommendations follow those established by the new ACR TI-RADS criteria (J Am Coll Radiol 0973;53:299-242). Electronically Signed   By: Corrie Mckusick D.O.   On: 09/20/2019 12:00    ASSESSMENT: This is a very pleasant 69 years old white male with likely stage IIIb (T3, N2, M0) non-small cell lung cancer, adenocarcinoma presented with large right upper lobe lung mass extending to the right suprahilar region in addition to right hilar and mediastinal  lymphadenopathy with obstruction of the associated upper lobe bronchus.  The patient also has bilateral subcentimeter pulmonary nodules that could be suspicious for metastatic disease changing his stage to stage IV.   PLAN: I had a lengthy discussion with the patient and his wife today about his current disease stage, prognosis and treatment options. I personally and independently reviewed the scan  images and discussed the result and showed the images to the patient and his wife. I recommended for the patient to complete the staging work-up by ordering a PET scan to rule out any other metastatic disease. I also recommend for the patient to send the tissue block for molecular studies and PD-L1 expression.  If there is insufficient tissue for testing, will consider sending blood sample to [facility] for molecular studies. If the patient has no evidence of metastatic disease other than the right upper lobe lung mass and the right hilar and mediastinal lymphadenopathy, he may benefit from a course of concurrent chemoradiation with weekly carboplatin for AUC of 2 and paclitaxel 45 mg/M2 followed by consolidation immunotherapy. I will arrange for the patient to come back for follow-up visit in around 2 weeks for more detailed discussion of his treatment options based on the final staging work-up and molecular studies. He was seen by Dr. Lisbeth Renshaw for evaluation of the obstructive right upper lobe lung mass and if the patient becomes symptomatic we will go ahead and consider him for palliative/curative radiotherapy depending on the final staging work-up. The patient was advised to call immediately if he has any other concerning symptoms in the interval. For the hypertension he was advised to monitor his blood pressure closely at home and to report to his primary care physician for adjustment of his medication if needed. The patient voices understanding of current disease status and treatment options and is in agreement with the current care plan.  All questions were answered. The patient knows to call the clinic with any problems, questions or concerns. We can certainly see the patient much sooner if necessary.  Thank you so much for allowing me to participate in the care of Micheal Oliver. I will continue to follow up the patient with you and assist in his care.   The total time spent in the appointment  was 90 minutes.  Disclaimer: This note was dictated with voice recognition software. Similar sounding words can inadvertently be transcribed and may not be corrected upon review.   Eilleen Kempf October 15, 2019, 3:07 PM

## 2019-10-15 NOTE — Progress Notes (Signed)
The proposed treatment discussed in cancer conference 10/15/19 is for discussion purpose only and is not a binding recommendation. The patient was not physically examined nor present for their treatment options.  Therefore, final treatment plans cannot be decided.  

## 2019-10-19 ENCOUNTER — Encounter (HOSPITAL_COMMUNITY): Payer: Self-pay | Admitting: Internal Medicine

## 2019-10-19 ENCOUNTER — Encounter: Payer: Self-pay | Admitting: *Deleted

## 2019-10-19 NOTE — Progress Notes (Signed)
I followed up on authorization for pet scan.  Status is still pending for authorization

## 2019-10-20 ENCOUNTER — Telehealth: Payer: Self-pay | Admitting: Medical Oncology

## 2019-10-20 NOTE — Telephone Encounter (Signed)
PET is still in review from insurance.PEr Julien Nordmann he wants to postpone appt until after PET scan for staging purposes.

## 2019-10-22 DIAGNOSIS — C677 Malignant neoplasm of urachus: Secondary | ICD-10-CM | POA: Diagnosis not present

## 2019-10-23 ENCOUNTER — Encounter (HOSPITAL_COMMUNITY): Payer: Self-pay | Admitting: Internal Medicine

## 2019-10-25 ENCOUNTER — Encounter: Payer: Self-pay | Admitting: Family Medicine

## 2019-10-26 ENCOUNTER — Ambulatory Visit (HOSPITAL_COMMUNITY)
Admission: RE | Admit: 2019-10-26 | Discharge: 2019-10-26 | Disposition: A | Discharge status: Home / Self Care | Payer: Medicare HMO | Source: Ambulatory Visit | Attending: Internal Medicine | Admitting: Internal Medicine

## 2019-10-26 ENCOUNTER — Telehealth: Payer: Self-pay

## 2019-10-26 ENCOUNTER — Other Ambulatory Visit: Payer: Self-pay

## 2019-10-26 DIAGNOSIS — K5790 Diverticulosis of intestine, part unspecified, without perforation or abscess without bleeding: Secondary | ICD-10-CM | POA: Diagnosis not present

## 2019-10-26 DIAGNOSIS — R590 Localized enlarged lymph nodes: Secondary | ICD-10-CM | POA: Diagnosis not present

## 2019-10-26 DIAGNOSIS — J9859 Other diseases of mediastinum, not elsewhere classified: Secondary | ICD-10-CM | POA: Diagnosis not present

## 2019-10-26 DIAGNOSIS — E041 Nontoxic single thyroid nodule: Secondary | ICD-10-CM | POA: Diagnosis not present

## 2019-10-26 DIAGNOSIS — K5730 Diverticulosis of large intestine without perforation or abscess without bleeding: Secondary | ICD-10-CM | POA: Insufficient documentation

## 2019-10-26 DIAGNOSIS — C3490 Malignant neoplasm of unspecified part of unspecified bronchus or lung: Secondary | ICD-10-CM

## 2019-10-26 LAB — GLUCOSE, CAPILLARY: Glucose-Capillary: 233 mg/dL — ABNORMAL HIGH (ref 70–99)

## 2019-10-26 MED ORDER — HYDROXYZINE HCL 25 MG PO TABS: 25.0000 mg | ORAL_TABLET | Freq: Three times a day (TID) | ORAL | 0 refills | Status: DC | PRN

## 2019-10-26 MED ORDER — FLUDEOXYGLUCOSE F - 18 (FDG) INJECTION
10.5000 | Freq: Once | INTRAVENOUS | Status: AC | PRN
  Administered 2019-10-26: 10.5 via INTRAVENOUS

## 2019-10-26 NOTE — Telephone Encounter (Signed)
PA initiated via Covermymeds; KEY: [id number]. Awaiting determination.

## 2019-10-26 NOTE — Telephone Encounter (Signed)
PA approved. Effective 04/03/2019 - 04/01/2020.

## 2019-10-28 ENCOUNTER — Telehealth: Payer: Self-pay | Admitting: Radiation Oncology

## 2019-10-28 ENCOUNTER — Other Ambulatory Visit: Payer: Self-pay

## 2019-10-28 ENCOUNTER — Encounter: Payer: Self-pay | Admitting: Internal Medicine

## 2019-10-28 ENCOUNTER — Encounter: Payer: Self-pay | Admitting: Family Medicine

## 2019-10-28 ENCOUNTER — Inpatient Hospital Stay (HOSPITAL_BASED_OUTPATIENT_CLINIC_OR_DEPARTMENT_OTHER): Payer: Medicare HMO | Admitting: Internal Medicine

## 2019-10-28 VITALS — BP 151/100 | HR 87 | Temp 97.9°F | Resp 20 | Ht 74.0 in | Wt 209.7 lb

## 2019-10-28 DIAGNOSIS — Z7189 Other specified counseling: Secondary | ICD-10-CM

## 2019-10-28 DIAGNOSIS — R590 Localized enlarged lymph nodes: Secondary | ICD-10-CM | POA: Diagnosis not present

## 2019-10-28 DIAGNOSIS — Z5112 Encounter for antineoplastic immunotherapy: Secondary | ICD-10-CM | POA: Insufficient documentation

## 2019-10-28 DIAGNOSIS — Z5111 Encounter for antineoplastic chemotherapy: Secondary | ICD-10-CM | POA: Insufficient documentation

## 2019-10-28 DIAGNOSIS — C3411 Malignant neoplasm of upper lobe, right bronchus or lung: Secondary | ICD-10-CM | POA: Diagnosis not present

## 2019-10-28 DIAGNOSIS — Z79899 Other long term (current) drug therapy: Secondary | ICD-10-CM | POA: Diagnosis not present

## 2019-10-28 MED ORDER — CYANOCOBALAMIN 1000 MCG/ML IJ SOLN
1000.0000 ug | Freq: Once | INTRAMUSCULAR | Status: AC
  Administered 2019-10-28: 1000 ug via INTRAMUSCULAR

## 2019-10-28 MED ORDER — CYANOCOBALAMIN 1000 MCG/ML IJ SOLN
INTRAMUSCULAR | Status: AC
  Filled 2019-10-28: qty 1

## 2019-10-28 MED ORDER — FOLIC ACID 1 MG PO TABS
1.0000 mg | ORAL_TABLET | Freq: Every day | ORAL | 4 refills | Status: DC
Start: 2019-10-28 — End: 2020-03-24

## 2019-10-28 MED ORDER — PROCHLORPERAZINE MALEATE 10 MG PO TABS
10.0000 mg | ORAL_TABLET | Freq: Four times a day (QID) | ORAL | 0 refills | Status: DC | PRN
Start: 2019-10-28 — End: 2019-10-29

## 2019-10-28 NOTE — Progress Notes (Signed)
[facility] Telephone:([phone]) [phone]   Fax:([phone]) [phone]  OFFICE PROGRESS NOTE  Copland, Gay Filler, MD [address] [place] [address]  DIAGNOSIS: stage IV (T3, N2, M1a) non-small cell lung cancer, adenocarcinoma presented with large right upper lobe lung mass extending to the right suprahilar region in addition to right hilar and mediastinal lymphadenopathy with obstruction of the associated upper lobe bronchus and bilateral subcentimeter pulmonary nodules diagnosed in July 2021.  Biomarker Findings Microsatellite status - MS-Stable Tumor Mutational Burden - 3 Muts/Mb Genomic Findings For a complete list of the genes assayed, please refer to the Appendix. ERBB2 [id number], amplification - equivocal HGF amplification - equivocal MTAP loss exons 5-8 MYC amplification - equivocal CDKN2A/B CDKN2B loss, CDKN2A loss CUL3 Y69f*19 RPTOR amplification TP53 [id number] Disease relevant genes with no reportable alterations: ALK, BRAF, EGFR, KRAS, MET, RET, ROS1  PDL1 Expression: Negative  PRIOR THERAPY: None.  CURRENT THERAPY: Systemic chemotherapy with carboplatin for AUC of 5, Alimta 500 mg/M2 and Keytruda 200 mg IV every 3 weeks.  First dose November 04, 2019.  INTERVAL HISTORY: Micheal Savino89.o. male returns to the clinic today for follow-up visit.  The patient is feeling fine today with no concerning complaints except for fatigue.  He denied having any current chest pain, shortness of breath, cough or hemoptysis.  He denied having any fever or chills.  He has no nausea, vomiting, diarrhea or constipation.  He denied having any weight loss or night sweats.  He has no headache or visual changes.  He had several studies performed recently including a PET scan as well as molecular studies by [facility].  He is here today for evaluation and discussion of his treatment options based on the new findings.  MEDICAL HISTORY: Past Medical  History:  Diagnosis Date   Chicken pox    DM2 (diabetes mellitus, type 2) ([facility]   History of kidney stones    passed several    Hypertension    Kidney stones    5-6 times   Measles    Mumps    [place]   [place], last one was 09/18/2019- has some expressive aphasia    ALLERGIES:  is allergic to bee venom.  MEDICATIONS:  Current Outpatient Medications  Medication Sig Dispense Refill   acetaminophen (TYLENOL) 500 MG tablet Take 500 mg by mouth every 6 (six) hours as needed.     aspirin 81 MG tablet Take 81 mg by mouth daily.     atorvastatin (LIPITOR) 40 MG tablet Take 1 tablet (40 mg total) by mouth daily at 6 PM. 90 tablet 3   clopidogrel (PLAVIX) 75 MG tablet Take 1 tablet (75 mg total) by mouth daily. Okay to restart this medication on 10/01/2019. 90 tablet 3   gabapentin (NEURONTIN) 300 MG capsule Take 1 capsule (300 mg total) by mouth at bedtime. 90 capsule 3   hydrOXYzine (ATARAX/VISTARIL) 25 MG tablet Take 1 tablet (25 mg total) by mouth 3 (three) times daily as needed for itching. 60 tablet 0   lisinopril (ZESTRIL) 10 MG tablet Take 1 tablet (10 mg total) by mouth daily. 90 tablet 3   metFORMIN (GLUCOPHAGE) 500 MG tablet TAKE TWO TABLETS BY MOUTH TWICE A DAY WITH MEAL(S) 360 tablet 3   Omega-3 Fatty Acids (FISH OIL) 1200 MG CAPS Take by mouth daily.     No current facility-administered medications for this visit.    SURGICAL HISTORY:  Past Surgical History:  Procedure Laterality Date   COLONOSCOPY W/ POLYPECTOMY     VIDEO BRONCHOSCOPY WITH ENDOBRONCHIAL ULTRASOUND N/A 09/30/2019   Procedure: VIDEO BRONCHOSCOPY WITH ENDOBRONCHIAL ULTRASOUND;  Surgeon: Collene Gobble, MD;  Location: [facility];  Service: Thoracic;  Laterality: N/A;   WISDOM TOOTH EXTRACTION  1976-77    REVIEW OF SYSTEMS:  Constitutional: positive for fatigue Eyes: negative Ears, nose, mouth, throat, and face: negative Respiratory: negative Cardiovascular: negative Gastrointestinal:  negative Genitourinary:negative Integument/breast: negative Hematologic/lymphatic: negative Musculoskeletal:negative Neurological: negative Behavioral/Psych: negative Endocrine: negative Allergic/Immunologic: negative   PHYSICAL EXAMINATION: General appearance: alert, cooperative, fatigued and no distress Head: Normocephalic, without obvious abnormality, atraumatic Neck: no adenopathy, no JVD, supple, symmetrical, trachea midline and thyroid not enlarged, symmetric, no tenderness/mass/nodules Lymph nodes: Cervical, supraclavicular, and axillary nodes normal. Resp: clear to auscultation bilaterally Back: symmetric, no curvature. ROM normal. No CVA tenderness. Cardio: regular rate and rhythm, S1, S2 normal, no murmur, click, rub or gallop GI: soft, non-tender; bowel sounds normal; no masses,  no organomegaly Extremities: extremities normal, atraumatic, no cyanosis or edema Neurologic: Alert and oriented X 3, normal strength and tone. Normal symmetric reflexes. Normal coordination and gait  ECOG PERFORMANCE STATUS: 1 - Symptomatic but completely ambulatory  Blood pressure (!) 151/100, pulse 87, temperature 97.9 F (36.6 C), temperature source Temporal, resp. rate 20, height _0  (1.88 m), weight (!) 209 lb 11.2 oz (95.1 kg), SpO2 98 %.  LABORATORY DATA: Lab Results  Component Value Date   WBC 9.1 10/15/2019   HGB 12.9 (L) 10/15/2019   HCT 38.6 (L) 10/15/2019   MCV 88.3 10/15/2019   PLT 193 10/15/2019      Chemistry      Component Value Date/Time   NA 142 10/15/2019 1431   K 4.3 10/15/2019 1431   CL 109 10/15/2019 1431   CO2 23 10/15/2019 1431   BUN 13 10/15/2019 1431   CREATININE 1.17 10/15/2019 1431   CREATININE 1.13 10/16/2013 1118      Component Value Date/Time   CALCIUM 9.4 10/15/2019 1431   ALKPHOS 84 10/15/2019 1431   AST 15 10/15/2019 1431   ALT 21 10/15/2019 1431   BILITOT 1.0 10/15/2019 1431       RADIOGRAPHIC STUDIES: NM PET Image Initial (PI) Skull  Base To Thigh  Result Date: 10/26/2019 CLINICAL DATA:  Initial treatment strategy for non-small cell lung cancer. EXAM: NUCLEAR MEDICINE PET SKULL BASE TO THIGH TECHNIQUE: 10.5 mCi F-18 FDG was injected intravenously. Full-ring PET imaging was performed from the skull base to thigh after the radiotracer. CT data was obtained and used for attenuation correction and anatomic localization. Fasting blood glucose: 233 mg/dl COMPARISON:  October 26, 2019 FINDINGS: Mediastinal blood pool activity: SUV max 2.88 Liver activity: SUV max NA NECK: No hypermetabolic lymph nodes in the neck. LEFT hemi thyroid in the area of the nodule identified on recent CT and ultrasound shows SUV of 3.3 Incidental CT findings: none CHEST: Large RIGHT paramediastinal and juxta hilar mass extending into the RIGHT upper lobe (image 23 of series 8) 6.2 x 4.5 cm (SUVmax = 8.6) Multiple tiny pulmonary nodules throughout both the LEFT and RIGHT chest as seen on recent CT evaluation of September 19, 2019 noncalcified nodule (image 43, series 8) 7 mm (SUVmax = 1.2) Example of another small pulmonary nodule in the RIGHT lower lobe (image 30, series 8) 6 mm also with uptake less than blood pool activity. Adenopathy in the mediastinum (image 71, series 4) 12 mm (SUVmax = 4.7)  Subcarinal nodal enlargement (image 78, series 4) (SUVmax = 4.24) AP window lymph node with enlargement (image 72, series 4) 1 cm (SUVmax = 3.2) Incidental CT findings: none ABDOMEN/PELVIS: No abnormal hypermetabolic activity within the liver, pancreas, adrenal glands, or spleen. No hypermetabolic lymph nodes in the abdomen or pelvis. Incidental CT findings: Signs of diverticular disease. Some mildly asymmetric colonic wall thickening in the distal sigmoid (image 176) nonfocal FDG uptake is demonstrated throughout the: Particularly the distal colon in the setting of diverticular changes. SKELETON: No focal hypermetabolic activity to suggest skeletal metastasis. Incidental CT findings: none  IMPRESSION: 1. Mass in the RIGHT upper lobe showing increased metabolic activity compatible with known malignancy in this location. Mass again displays extension into mediastinal fat and obstruction of upper lobe bronchi. 2. Multiple pulmonary nodules suspicious for metastatic disease similar to recent CT evaluation, not showing FDG uptake beyond blood pool activity. 3. Mediastinal lymph nodes with hypermetabolic features including RIGHT paratracheal and subcarinal lymph nodes. Mildly enlarged AP window lymph node is FDG avid slightly above mediastinal blood pool as discussed. 4. Findings of diverticular disease with more focal thickening of the distal sigmoid as discussed. Would correlate with recent colonoscopy results or with colonoscopy if no recent colonoscopy has been performed. Generalized uptake associated with these changes is more likely related to low level inflammation and is more diffuse than expected for malignancy. Electronically Signed   By: Zetta Bills M.D.   On: 10/26/2019 09:28    ASSESSMENT AND PLAN: This is a very pleasant 69 years old white male recently diagnosed with a stage IV (T3, N2, M1 a) non-small cell lung cancer, adenocarcinoma presented with large right upper lobe lung mass with extension to the right suprahilar region as well as right hilar and mediastinal lymphadenopathy with obstruction of the right upper lobe and bilateral pulmonary nodules diagnosed in July 2021. The molecular study shows no actionable mutations and PD-L1 expression is negative. The patient had a PET scan performed recently.  I personally and independently reviewed the scan images and discussed the results with the patient today. I explained to the patient that he has incurable condition and all the treatment options will be of palliative nature. I gave him the option of treatment for the locally advanced disease with concurrent chemoradiation and close monitoring of the other bilateral pulmonary  nodules versus treatment for his stage IV with systemic chemotherapy with carboplatin for AUC of 5, Alimta 500 mg/M2 and Keytruda 200 mg IV every 3 weeks.  The patient would like to proceed with the systemic chemotherapy with carboplatin, Alimta and Keytruda for the stage IV disease. He will see Dr. Lisbeth Renshaw for consideration of short course of palliative radiotherapy to the obstructive right lung mass. I discussed with the patient the adverse effect of this treatment including but not limited to alopecia, myelosuppression, nausea and vomiting, peripheral neuropathy, liver or renal dysfunction as well as immunotherapy adverse effects. He is expected to start the first cycle of his treatment on November 04, 2019. He will receive vitamin B12 injection today. I will send the prescription for folic acid 1 mg p.o. daily as well as Compazine 10 mg p.o. every 6 hours as needed for nausea to his pharmacy. He will have a chemotherapy education class before the first dose of his treatment. The patient will come back for follow-up visit in 2 weeks for evaluation and management of any adverse effect of his treatment. For the hypertension he will follow with his primary care physician for  adjustment of his medication. He was advised to call immediately if he has any concerning symptoms in the interval. The patient voices understanding of current disease status and treatment options and is in agreement with the current care plan.  All questions were answered. The patient knows to call the clinic with any problems, questions or concerns. We can certainly see the patient much sooner if necessary.  The total time spent in the appointment was 50 minutes.  Disclaimer: This note was dictated with voice recognition software. Similar sounding words can inadvertently be transcribed and may not be corrected upon review.

## 2019-10-28 NOTE — Telephone Encounter (Signed)
I called and spoke with the patient to coordinate palliative radiotherapy simulation next week. He will begin chemotherapy next week as well.

## 2019-10-28 NOTE — Telephone Encounter (Signed)
After review by Dr. Tammi Klippel in Dr. Ida Rogue stead, he recommends more urgent timing for planning treatment and the patient will come in tomorrow for simulation.

## 2019-10-28 NOTE — Progress Notes (Signed)
START ON PATHWAY REGIMEN - Non-Small Cell Lung     A cycle is every 21 days:     Pembrolizumab      Pemetrexed      Carboplatin   **Always confirm dose/schedule in your pharmacy ordering system**  Patient Characteristics: Stage IV Metastatic, Nonsquamous, Initial Chemotherapy/Immunotherapy, PS = 0, 1, ALK Rearrangement Negative and ROS1 Rearrangement Negative and NTRK Gene Fusion?Negative and RET Gene Fusion?Negative and EGFR Mutation Negative/Non?Sensitizing, PD-L1  Expression Positive 1-49% (TPS) / Negative / Not Tested / Awaiting Test Results and Immunotherapy Candidate Therapeutic Status: Stage IV Metastatic Histology: Nonsquamous Cell ROS1 Rearrangement Status: Negative Other Mutations/Biomarkers: No Other Actionable Mutations NTRK Gene Fusion Status: Negative PD-L1 Expression Status: PD-L1 Negative Chemotherapy/Immunotherapy LOT: Initial Chemotherapy/Immunotherapy Molecular Targeted Therapy: Not Appropriate MET Exon 14 Mutation Status: Negative RET Gene Fusion Status: Negative ALK Rearrangement Status: Negative EGFR Mutation Status: Negative/Wild Type BRAF V600E Mutation Status: Negative ECOG Performance Status: 1 Biomarker Assessment Status Confirmation: All Genomic Markers Negative or Only MET+ or BRAF+ Immunotherapy Candidate Status: Candidate for Immunotherapy Intent of Therapy: Non-Curative / Palliative Intent, Not Discussed with Patient

## 2019-10-29 ENCOUNTER — Ambulatory Visit: Payer: Medicare HMO | Admitting: Cardiology

## 2019-10-29 ENCOUNTER — Ambulatory Visit
Admission: RE | Admit: 2019-10-29 | Discharge: 2019-10-29 | Disposition: A | Discharge status: Home / Self Care | Payer: Medicare HMO | Source: Ambulatory Visit | Attending: Radiation Oncology | Admitting: Radiation Oncology

## 2019-10-29 ENCOUNTER — Other Ambulatory Visit: Payer: Self-pay

## 2019-10-29 ENCOUNTER — Encounter: Payer: Self-pay | Admitting: Cardiology

## 2019-10-29 VITALS — BP 140/86 | HR 88 | Ht 74.0 in | Wt 209.4 lb

## 2019-10-29 DIAGNOSIS — C3411 Malignant neoplasm of upper lobe, right bronchus or lung: Secondary | ICD-10-CM | POA: Diagnosis not present

## 2019-10-29 DIAGNOSIS — I639 Cerebral infarction, unspecified: Secondary | ICD-10-CM

## 2019-10-29 NOTE — Progress Notes (Signed)
Electrophysiology Office Note   Date:  10/29/2019   ID:  Micheal Oliver, DOB 1950-10-31, MRN [id number]  PCP:  Darreld Mclean, MD  Cardiologist:   Primary Electrophysiologist:  Hooper Petteway Meredith Leeds, MD    Chief Complaint: CVA   History of Present Illness: Micheal Oliver is a 69 y.o. male who is being seen today for the evaluation of CVA at the request of Copland, Gay Filler, MD. Presenting today for electrophysiology evaluation.  He has a history significant for type 2 diabetes, hypertension, and hyperlipidemia.  He developed expressive aphasia June 2021.  MRI of the brain showed a 4 cm acute posterior left MCA territory infarct with small bilateral occipital and cerebellar infarcts and mild chronic small vessel ischemic disease.  On evaluation, CT scan of the neck showed a left lobe hilar mass which was found to be stage IV adenocarcinoma.  He is currently undergoing radiation therapy.  Today, he denies symptoms of palpitations, chest pain, shortness of breath, orthopnea, PND, lower extremity edema, claudication, dizziness, presyncope, syncope, bleeding, or neurologic sequela. The patient is tolerating medications without difficulties.    Past Medical History:  Diagnosis Date  . Chicken pox   . DM2 (diabetes mellitus, type 2) ([place])   . History of kidney stones    passed several   . Hypertension   . Kidney stones    5-6 times  . Measles   . Mumps   . Stroke ([place])    x 2, last one was 09/18/2019- has some expressive aphasia   Past Surgical History:  Procedure Laterality Date  . COLONOSCOPY W/ POLYPECTOMY    . VIDEO BRONCHOSCOPY WITH ENDOBRONCHIAL ULTRASOUND N/A 09/30/2019   Procedure: VIDEO BRONCHOSCOPY WITH ENDOBRONCHIAL ULTRASOUND;  Surgeon: Collene Gobble, MD;  Location: [facility];  Service: Thoracic;  Laterality: N/A;  . WISDOM TOOTH EXTRACTION  1976-77     Current Outpatient Medications  Medication Sig Dispense Refill  . acetaminophen (TYLENOL) 500 MG tablet Take  500 mg by mouth every 6 (six) hours as needed.    Marland Kitchen aspirin 81 MG tablet Take 81 mg by mouth daily.    Marland Kitchen atorvastatin (LIPITOR) 40 MG tablet Take 1 tablet (40 mg total) by mouth daily at 6 PM. 90 tablet 3  . clopidogrel (PLAVIX) 75 MG tablet Take 1 tablet (75 mg total) by mouth daily. Okay to restart this medication on 10/01/2019. 90 tablet 3  . folic acid (FOLVITE) 1 MG tablet Take 1 tablet (1 mg total) by mouth daily. 30 tablet 4  . gabapentin (NEURONTIN) 300 MG capsule Take 1 capsule (300 mg total) by mouth at bedtime. 90 capsule 3  . hydrOXYzine (ATARAX/VISTARIL) 25 MG tablet Take 1 tablet (25 mg total) by mouth 3 (three) times daily as needed for itching. 60 tablet 0  . lisinopril (ZESTRIL) 10 MG tablet Take 20 mg by mouth daily.    . metFORMIN (GLUCOPHAGE) 500 MG tablet TAKE TWO TABLETS BY MOUTH TWICE A DAY WITH MEAL(S) 360 tablet 3  . Omega-3 Fatty Acids (FISH OIL) 1200 MG CAPS Take by mouth daily.     No current facility-administered medications for this visit.    Allergies:   Bee venom   Social History:  The patient  reports that he has never smoked. He has never used smokeless tobacco. He reports that he does not drink alcohol and does not use drugs.   Family History:  The patient's family history includes Alcohol abuse (age of onset: 47) in his father; Anxiety  disorder in his daughter; Arthritis in his maternal grandmother; Dementia in his paternal grandmother; Healthy in his son; Heart attack in his paternal aunt; Heart disease in his father; Hypertension in his father and paternal grandmother; Lung disease in his father; Other in his maternal aunt; Stomach cancer (age of onset: 83) in his mother; Stroke in his paternal grandmother; Sudden death in his maternal grandfather and paternal grandfather.    ROS:  Please see the history of present illness.   Otherwise, review of systems is positive for none.   All other systems are reviewed and negative.    PHYSICAL EXAM: VS:  BP (!)  140/86   Pulse 88   Ht 6\' 2"  (1.88 m)   Wt (!) 209 lb 6.4 oz (95 kg)   SpO2 97%   BMI 26.89 kg/m  , BMI Body mass index is 26.89 kg/m. GEN: Well nourished, well developed, in no acute distress  HEENT: normal  Neck: no JVD, carotid bruits, or masses Cardiac: RRR; no murmurs, rubs, or gallops,no edema  Respiratory:  clear to auscultation bilaterally, normal work of breathing GI: soft, nontender, nondistended, + BS MS: no deformity or atrophy  Skin: warm and dry Neuro:  Strength and sensation are intact Psych: euthymic mood, full affect  EKG:  EKG is not ordered today. Personal review of the ekg ordered 09/21/19 shows sinus rhythm, rate 87  Recent Labs: 10/15/2019: ALT 21; BUN 13; Creatinine 1.17; Hemoglobin 12.9; Platelet Count 193; Potassium 4.3; Sodium 142    Lipid Panel     Component Value Date/Time   CHOL 106 09/19/2019 0431   TRIG 184 (H) 09/19/2019 0431   HDL 27 (L) 09/19/2019 0431   CHOLHDL 3.9 09/19/2019 0431   VLDL 37 09/19/2019 0431   LDLCALC 42 09/19/2019 0431   LDLDIRECT 66.0 01/10/2016 1110     Wt Readings from Last 3 Encounters:  10/29/19 (!) 209 lb 6.4 oz (95 kg)  10/28/19 (!) 209 lb 11.2 oz (95.1 kg)  10/15/19 211 lb 11.2 oz (96 kg)      Other studies Reviewed: Additional studies/ records that were reviewed today include: TTE 09/19/19  Review of the above records today demonstrates:  1. Left ventricular ejection fraction, by estimation, is 60 to 65%. The  left ventricle has normal function. The left ventricle has no regional  wall motion abnormalities. There is moderate concentric left ventricular  hypertrophy. Left ventricular  diastolic parameters are consistent with Grade I diastolic dysfunction  (impaired relaxation).  2. Right ventricular systolic function is normal. The right ventricular  size is normal.  3. Left atrial size was mildly dilated.  4. The mitral valve is normal in structure. Mild mitral valve  regurgitation. No evidence of  mitral stenosis.  5. The aortic valve is normal in structure. Aortic valve regurgitation is  trivial. No aortic stenosis is present.  6. The inferior vena cava is normal in size with greater than 50%  respiratory variability, suggesting right atrial pressure of 3 mmHg.    ASSESSMENT AND PLAN:  1.  Cryptogenic stroke: Patient with MRI that showed cryptogenic stroke likely embolic phenomenon.  Fortunately he is recovered from his overall symptoms.  No obvious source has been found.  I discussed with him the possibility of Linq monitor implant for monitoring of atrial fibrillation.  Risks and benefits were discussed.  Risks include bleeding and infection.  The patient understands these risks and has agreed to the procedure.  He Jamecia Lerman require radiation therapy for his lung cancer.  We Aryannah Mohon check with radiation oncology to ensure that the Linq monitor Kanija Remmel not be placed in the area of his radiation.  2.  Hypertension: Continue lisinopril per primary physician.  3.  Hyperlipidemia: Continue atorvastatin  Case discussed with radiation oncology  Current medicines are reviewed at length with the patient today.   The patient does not have concerns regarding his medicines.  The following changes were made today:  none  Labs/ tests ordered today include:  No orders of the defined types were placed in this encounter.    Disposition:   FU with Ayaka Andes pending link monitor results  Signed, Glenden Rossell Meredith Leeds, MD  10/29/2019 8:37 AM     [facility] [address] [place] [address] [phone] (office) [phone] (fax)

## 2019-10-29 NOTE — Patient Instructions (Signed)
Medication Instructions:  Your physician recommends that you continue on your current medications as directed. Please refer to the Current Medication list given to you today.  *If you need a refill on your cardiac medications before your next appointment, please call your pharmacy*   Lab Work: None Ordered If you have labs (blood work) drawn today and your tests are completely normal, you will receive your results only by: Marland Kitchen MyChart Message (if you have MyChart) OR . A paper copy in the mail If you have any lab test that is abnormal or we need to change your treatment, we will call you to review the results.   Testing/Procedures: None Ordered   Follow-Up: At [facility], you and your health needs are our priority.  As part of our continuing mission to provide you with exceptional heart care, we have created designated Provider Care Teams.  These Care Teams include your primary Cardiologist (physician) and Advanced Practice Providers (APPs -  Physician Assistants and Nurse Practitioners) who all work together to provide you with the care you need, when you need it.  We recommend signing up for the patient portal called "MyChart".  Sign up information is provided on this After Visit Summary.  MyChart is used to connect with patients for Virtual Visits (Telemedicine).  Patients are able to view lab/test results, encounter notes, upcoming appointments, etc.  Non-urgent messages can be sent to your provider as well.   To learn more about what you can do with MyChart, go to [email or web address].    Your next appointment:    As Needed - for placement of LINQ monitor  The format for your next appointment:   In Person  Provider:   Dr. Curt Bears

## 2019-10-30 ENCOUNTER — Inpatient Hospital Stay: Payer: Medicare HMO

## 2019-10-30 ENCOUNTER — Telehealth: Payer: Self-pay | Admitting: Internal Medicine

## 2019-10-30 ENCOUNTER — Other Ambulatory Visit: Payer: Self-pay

## 2019-10-30 NOTE — Telephone Encounter (Signed)
Scheduled per 07/28 los, called patient and left a voicemail.

## 2019-11-02 ENCOUNTER — Ambulatory Visit: Payer: Medicare HMO | Admitting: Radiation Oncology

## 2019-11-02 DIAGNOSIS — Z510 Encounter for antineoplastic radiation therapy: Secondary | ICD-10-CM | POA: Insufficient documentation

## 2019-11-02 DIAGNOSIS — C3411 Malignant neoplasm of upper lobe, right bronchus or lung: Secondary | ICD-10-CM | POA: Diagnosis present

## 2019-11-03 ENCOUNTER — Encounter: Payer: Self-pay | Admitting: Radiation Oncology

## 2019-11-03 ENCOUNTER — Other Ambulatory Visit: Payer: Self-pay

## 2019-11-03 ENCOUNTER — Ambulatory Visit
Admission: RE | Admit: 2019-11-03 | Discharge: 2019-11-03 | Disposition: A | Discharge status: Home / Self Care | Payer: Medicare HMO | Source: Ambulatory Visit | Attending: Radiation Oncology | Admitting: Radiation Oncology

## 2019-11-03 DIAGNOSIS — C3411 Malignant neoplasm of upper lobe, right bronchus or lung: Secondary | ICD-10-CM | POA: Diagnosis not present

## 2019-11-03 DIAGNOSIS — Z510 Encounter for antineoplastic radiation therapy: Secondary | ICD-10-CM | POA: Diagnosis not present

## 2019-11-03 NOTE — Progress Notes (Signed)
Pharmacist Chemotherapy Monitoring - Initial Assessment    Anticipated start date: 11/04/19   Regimen:  . Are orders appropriate based on the patient's diagnosis, regimen, and cycle? Yes . Does the plan date match the patient's scheduled date? Yes . Is the sequencing of drugs appropriate? Yes . Are the premedications appropriate for the patient's regimen? Yes . Prior Authorization for treatment is: Approved o If applicable, is the correct biosimilar selected based on the patient's insurance? not applicable  Organ Function and Labs: Marland Kitchen Are dose adjustments needed based on the patient's renal function, hepatic function, or hematologic function? No . Are appropriate labs ordered prior to the start of patient's treatment? Yes, TSH ordered for 11/04/19 . Other organ system assessment, if indicated: N/A . The following baseline labs, if indicated, have been ordered: pembrolizumab: baseline TSH +/- T4  Dose Assessment: . Are the drug doses appropriate? Yes . Are the following correct: o Drug concentrations Yes o IV fluid compatible with drug Yes o Administration routes Yes o Timing of therapy Yes . If applicable, does the patient have documented access for treatment and/or plans for port-a-cath placement? Unknown . If applicable, have lifetime cumulative doses been properly documented and assessed? not applicable Lifetime Dose Tracking  No doses have been documented on this patient for the following tracked chemicals: Doxorubicin, Epirubicin, Idarubicin, Daunorubicin, Mitoxantrone, Bleomycin, Oxaliplatin, Carboplatin, Liposomal Doxorubicin  o   Toxicity Monitoring/Prevention: . The patient has the following take home antiemetics prescribed: Need Rx for home antiemetics . The patient has the following take home medications prescribed: B12 for pemetrexed and folic acid for pemetrexed . Medication allergies and previous infusion related reactions, if applicable, have been reviewed and addressed.  Yes . The patient's current medication list has been assessed for drug-drug interactions with their chemotherapy regimen. no significant drug-drug interactions were identified on review.  Order Review: . Are the treatment plan orders signed? Yes . Is the patient scheduled to see a provider prior to their treatment? No  I verify that I have reviewed each item in the above checklist and answered each question accordingly.  Micheal Oliver 11/03/2019 10:39 AM

## 2019-11-04 ENCOUNTER — Other Ambulatory Visit: Payer: Self-pay | Admitting: Medical Oncology

## 2019-11-04 ENCOUNTER — Ambulatory Visit: Payer: Medicare HMO | Admitting: Radiation Oncology

## 2019-11-04 ENCOUNTER — Inpatient Hospital Stay (HOSPITAL_BASED_OUTPATIENT_CLINIC_OR_DEPARTMENT_OTHER): Payer: Medicare HMO | Admitting: Medical

## 2019-11-04 ENCOUNTER — Other Ambulatory Visit: Payer: Self-pay

## 2019-11-04 ENCOUNTER — Inpatient Hospital Stay: Payer: Medicare HMO | Attending: Internal Medicine

## 2019-11-04 ENCOUNTER — Inpatient Hospital Stay: Payer: Medicare HMO

## 2019-11-04 ENCOUNTER — Other Ambulatory Visit: Payer: Self-pay | Admitting: Medical

## 2019-11-04 ENCOUNTER — Ambulatory Visit
Admission: RE | Admit: 2019-11-04 | Discharge: 2019-11-04 | Disposition: A | Discharge status: Home / Self Care | Payer: Medicare HMO | Source: Ambulatory Visit | Attending: Radiation Oncology | Admitting: Radiation Oncology

## 2019-11-04 VITALS — BP 138/85 | HR 98 | Resp 18 | Ht 74.0 in | Wt 205.8 lb

## 2019-11-04 DIAGNOSIS — Z79899 Other long term (current) drug therapy: Secondary | ICD-10-CM | POA: Diagnosis not present

## 2019-11-04 DIAGNOSIS — Z5111 Encounter for antineoplastic chemotherapy: Secondary | ICD-10-CM | POA: Insufficient documentation

## 2019-11-04 DIAGNOSIS — Z20822 Contact with and (suspected) exposure to covid-19: Secondary | ICD-10-CM | POA: Insufficient documentation

## 2019-11-04 DIAGNOSIS — C3411 Malignant neoplasm of upper lobe, right bronchus or lung: Secondary | ICD-10-CM

## 2019-11-04 DIAGNOSIS — Z510 Encounter for antineoplastic radiation therapy: Secondary | ICD-10-CM | POA: Diagnosis not present

## 2019-11-04 DIAGNOSIS — R05 Cough: Secondary | ICD-10-CM

## 2019-11-04 DIAGNOSIS — R062 Wheezing: Secondary | ICD-10-CM

## 2019-11-04 DIAGNOSIS — R059 Cough, unspecified: Secondary | ICD-10-CM

## 2019-11-04 DIAGNOSIS — R0981 Nasal congestion: Secondary | ICD-10-CM | POA: Insufficient documentation

## 2019-11-04 LAB — CMP (CANCER CENTER ONLY)
ALT: 24 U/L (ref 0–44)
AST: 23 U/L (ref 15–41)
Albumin: 3.9 g/dL (ref 3.5–5.0)
Alkaline Phosphatase: 91 U/L (ref 38–126)
Anion gap: 12 (ref 5–15)
BUN: 21 mg/dL (ref 8–23)
CO2: 20 mmol/L — ABNORMAL LOW (ref 22–32)
Calcium: 9.9 mg/dL (ref 8.9–10.3)
Chloride: 106 mmol/L (ref 98–111)
Creatinine: 1.31 mg/dL — ABNORMAL HIGH (ref 0.61–1.24)
GFR, Est AFR Am: >60 mL/min (ref >60)
GFR, Estimated: 56 mL/min — ABNORMAL LOW (ref >60)
Glucose, Bld: 208 mg/dL — ABNORMAL HIGH (ref 70–99)
Potassium: 4.3 mmol/L (ref 3.5–5.1)
Sodium: 138 mmol/L (ref 135–145)
Total Bilirubin: 1.2 mg/dL (ref 0.3–1.2)
Total Protein: 7.4 g/dL (ref 6.5–8.1)

## 2019-11-04 LAB — CBC WITH DIFFERENTIAL (CANCER CENTER ONLY)
Abs Immature Granulocytes: 0.03 10*3/uL (ref 0.00–0.07)
Basophils Absolute: 0.1 10*3/uL (ref 0.0–0.1)
Basophils Relative: 1 %
Eosinophils Absolute: 0.2 10*3/uL (ref 0.0–0.5)
Eosinophils Relative: 2 %
HCT: 39.7 % (ref 39.0–52.0)
Hemoglobin: 13.6 g/dL (ref 13.0–17.0)
Immature Granulocytes: 0 %
Lymphocytes Relative: 13 %
Lymphs Abs: 1.1 10*3/uL (ref 0.7–4.0)
MCH: 30 pg (ref 26.0–34.0)
MCHC: 34.3 g/dL (ref 30.0–36.0)
MCV: 87.6 fL (ref 80.0–100.0)
Monocytes Absolute: 0.8 10*3/uL (ref 0.1–1.0)
Monocytes Relative: 9 %
Neutro Abs: 6.6 10*3/uL (ref 1.7–7.7)
Neutrophils Relative %: 75 %
Platelet Count: 169 10*3/uL (ref 150–400)
RBC: 4.53 MIL/uL (ref 4.22–5.81)
RDW: 12.9 % (ref 11.5–15.5)
WBC Count: 8.7 10*3/uL (ref 4.0–10.5)
nRBC: 0 % (ref 0.0–0.2)

## 2019-11-04 LAB — SARS CORONAVIRUS 2 (TAT 6-24 HRS): SARS Coronavirus 2: NEGATIVE

## 2019-11-04 LAB — TSH: TSH: 0.844 u[IU]/mL (ref 0.320–4.118)

## 2019-11-04 MED ORDER — SODIUM CHLORIDE 0.9 % IV SOLN
10.0000 mg | Freq: Once | INTRAVENOUS | Status: AC
  Administered 2019-11-04: 10 mg via INTRAVENOUS
  Filled 2019-11-04: qty 10

## 2019-11-04 MED ORDER — AMOXICILLIN-POT CLAVULANATE 875-125 MG PO TABS
1.0000 | ORAL_TABLET | Freq: Two times a day (BID) | ORAL | 0 refills | Status: DC
Start: 2019-11-04 — End: 2019-11-26

## 2019-11-04 MED ORDER — SODIUM CHLORIDE 0.9 % IV SOLN
200.0000 mg | Freq: Once | INTRAVENOUS | Status: AC
  Administered 2019-11-04: 200 mg via INTRAVENOUS
  Filled 2019-11-04: qty 8

## 2019-11-04 MED ORDER — PALONOSETRON HCL INJECTION 0.25 MG/5ML
INTRAVENOUS | Status: AC
  Filled 2019-11-04: qty 5

## 2019-11-04 MED ORDER — ALBUTEROL SULFATE HFA 108 (90 BASE) MCG/ACT IN AERS
INHALATION_SPRAY | RESPIRATORY_TRACT | Status: AC
  Filled 2019-11-04: qty 6.7

## 2019-11-04 MED ORDER — SODIUM CHLORIDE 0.9 % IV SOLN
150.0000 mg | Freq: Once | INTRAVENOUS | Status: AC
  Administered 2019-11-04: 150 mg via INTRAVENOUS
  Filled 2019-11-04: qty 150

## 2019-11-04 MED ORDER — ALBUTEROL SULFATE HFA 108 (90 BASE) MCG/ACT IN AERS
2.0000 | INHALATION_SPRAY | RESPIRATORY_TRACT | Status: DC
  Administered 2019-11-04: 2 via RESPIRATORY_TRACT

## 2019-11-04 MED ORDER — SODIUM CHLORIDE 0.9 % IV SOLN
531.5000 mg | Freq: Once | INTRAVENOUS | Status: AC
  Administered 2019-11-04: 530 mg via INTRAVENOUS
  Filled 2019-11-04: qty 53

## 2019-11-04 MED ORDER — SODIUM CHLORIDE 0.9 % IV SOLN
500.0000 mg/m2 | Freq: Once | INTRAVENOUS | Status: AC
  Administered 2019-11-04: 1100 mg via INTRAVENOUS
  Filled 2019-11-04: qty 40

## 2019-11-04 MED ORDER — PALONOSETRON HCL INJECTION 0.25 MG/5ML
0.2500 mg | Freq: Once | INTRAVENOUS | Status: AC
  Administered 2019-11-04: 0.25 mg via INTRAVENOUS

## 2019-11-04 MED ORDER — SODIUM CHLORIDE 0.9 % IV SOLN
Freq: Once | INTRAVENOUS | Status: AC
  Filled 2019-11-04: qty 250

## 2019-11-04 MED ORDER — HYDROCODONE-HOMATROPINE 5-1.5 MG/5ML PO SYRP: 5.0000 mL | ORAL_SOLUTION | Freq: Four times a day (QID) | ORAL | 0 refills | Status: DC | PRN

## 2019-11-04 NOTE — Patient Instructions (Signed)
[facility] Discharge Instructions for Patients Receiving Chemotherapy  Today you received the following chemotherapy agents: Pembrolizumab, Pemetrexed, and Carboplatin  To help prevent nausea and vomiting after your treatment, we encourage you to take your nausea medication as prescribed.    If you develop nausea and vomiting that is not controlled by your nausea medication, call the clinic.   BELOW ARE SYMPTOMS THAT SHOULD BE REPORTED IMMEDIATELY:  *FEVER GREATER THAN 100.5 F  *CHILLS WITH OR WITHOUT FEVER  NAUSEA AND VOMITING THAT IS NOT CONTROLLED WITH YOUR NAUSEA MEDICATION  *UNUSUAL SHORTNESS OF BREATH  *UNUSUAL BRUISING OR BLEEDING  TENDERNESS IN MOUTH AND THROAT WITH OR WITHOUT PRESENCE OF ULCERS  *URINARY PROBLEMS  *BOWEL PROBLEMS  UNUSUAL RASH Items with * indicate a potential emergency and should be followed up as soon as possible.  Feel free to call the clinic should you have any questions or concerns. The clinic phone number is [phone].  Please show the [facility] at check-in to the Emergency Department and triage nurse.  Pemetrexed injection What is this medicine? PEMETREXED (PEM e TREX ed) is a chemotherapy drug used to treat lung cancers like non-small cell lung cancer and mesothelioma. It may also be used to treat other cancers. This medicine may be used for other purposes; ask your health care provider or pharmacist if you have questions. COMMON BRAND NAME(S): Alimta What should I tell my health care provider before I take this medicine? They need to know if you have any of these conditions:  infection (especially a virus infection such as chickenpox, cold sores, or herpes)  kidney disease  low blood counts, like low white cell, platelet, or red cell counts  lung or breathing disease, like asthma  radiation therapy  an unusual or allergic reaction to pemetrexed, other medicines, foods, dyes, or preservative  pregnant  or trying to get pregnant  breast-feeding How should I use this medicine? This drug is given as an infusion into a vein. It is administered in a hospital or clinic by a specially trained health care professional. Talk to your pediatrician regarding the use of this medicine in children. Special care may be needed. Overdosage: If you think you have taken too much of this medicine contact a poison control center or emergency room at once. NOTE: This medicine is only for you. Do not share this medicine with others. What if I miss a dose? It is important not to miss your dose. Call your doctor or health care professional if you are unable to keep an appointment. What may interact with this medicine? This medicine may interact with the following medications:  Ibuprofen This list may not describe all possible interactions. Give your health care provider a list of all the medicines, herbs, non-prescription drugs, or dietary supplements you use. Also tell them if you smoke, drink alcohol, or use illegal drugs. Some items may interact with your medicine. What should I watch for while using this medicine? Visit your doctor for checks on your progress. This drug may make you feel generally unwell. This is not uncommon, as chemotherapy can affect healthy cells as well as cancer cells. Report any side effects. Continue your course of treatment even though you feel ill unless your doctor tells you to stop. In some cases, you may be given additional medicines to help with side effects. Follow all directions for their use. Call your doctor or health care professional for advice if you get a fever, chills or sore  throat, or other symptoms of a cold or flu. Do not treat yourself. This drug decreases your body's ability to fight infections. Try to avoid being around people who are sick. This medicine may increase your risk to bruise or bleed. Call your doctor or health care professional if you notice any unusual  bleeding. Be careful brushing and flossing your teeth or using a toothpick because you may get an infection or bleed more easily. If you have any dental work done, tell your dentist you are receiving this medicine. Avoid taking products that contain aspirin, acetaminophen, ibuprofen, naproxen, or ketoprofen unless instructed by your doctor. These medicines may hide a fever. Call your doctor or health care professional if you get diarrhea or mouth sores. Do not treat yourself. To protect your kidneys, drink water or other fluids as directed while you are taking this medicine. Do not become pregnant while taking this medicine or for 6 months after stopping it. Women should inform their doctor if they wish to become pregnant or think they might be pregnant. Men should not father a child while taking this medicine and for 3 months after stopping it. This may interfere with the ability to father a child. You should talk to your doctor or health care professional if you are concerned about your fertility. There is a potential for serious side effects to an unborn child. Talk to your health care professional or pharmacist for more information. Do not breast-feed an infant while taking this medicine or for 1 week after stopping it. What side effects may I notice from receiving this medicine? Side effects that you should report to your doctor or health care professional as soon as possible:  allergic reactions like skin rash, itching or hives, swelling of the face, lips, or tongue  breathing problems  redness, blistering, peeling or loosening of the skin, including inside the mouth  signs and symptoms of bleeding such as bloody or black, tarry stools; red or dark-brown urine; spitting up blood or brown material that looks like coffee grounds; red spots on the skin; unusual bruising or bleeding from the eye, gums, or nose  signs and symptoms of infection like fever or chills; cough; sore throat; pain or  trouble passing urine  signs and symptoms of kidney injury like trouble passing urine or change in the amount of urine  signs and symptoms of liver injury like dark yellow or brown urine; general ill feeling or flu-like symptoms; light-colored stools; loss of appetite; nausea; right upper belly pain; unusually weak or tired; yellowing of the eyes or skin Side effects that usually do not require medical attention (report to your doctor or health care professional if they continue or are bothersome):  constipation  mouth sores  nausea, vomiting  unusually weak or tired This list may not describe all possible side effects. Call your doctor for medical advice about side effects. You may report side effects to FDA at 1-800-FDA-1088. Where should I keep my medicine? This drug is given in a hospital or clinic and will not be stored at home. NOTE: This sheet is a summary. It may not cover all possible information. If you have questions about this medicine, talk to your doctor, pharmacist, or health care provider.  2020 Elsevier/Gold Standard (2017-05-08 16:11:33)   Pembrolizumab injection What is this medicine? PEMBROLIZUMAB (pem broe liz ue mab) is a monoclonal antibody. It is used to treat certain types of cancer. This medicine may be used for other purposes; ask your health care  provider or pharmacist if you have questions. COMMON BRAND NAME(S): Keytruda What should I tell my health care provider before I take this medicine? They need to know if you have any of these conditions:  diabetes  immune system problems  inflammatory bowel disease  liver disease  lung or breathing disease  lupus  received or scheduled to receive an organ transplant or a stem-cell transplant that uses donor stem cells  an unusual or allergic reaction to pembrolizumab, other medicines, foods, dyes, or preservatives  pregnant or trying to get pregnant  breast-feeding How should I use this  medicine? This medicine is for infusion into a vein. It is given by a health care professional in a hospital or clinic setting. A special MedGuide will be given to you before each treatment. Be sure to read this information carefully each time. Talk to your pediatrician regarding the use of this medicine in children. While this drug may be prescribed for children as young as 6 months for selected conditions, precautions do apply. Overdosage: If you think you have taken too much of this medicine contact a poison control center or emergency room at once. NOTE: This medicine is only for you. Do not share this medicine with others. What if I miss a dose? It is important not to miss your dose. Call your doctor or health care professional if you are unable to keep an appointment. What may interact with this medicine? Interactions have not been studied. Give your health care provider a list of all the medicines, herbs, non-prescription drugs, or dietary supplements you use. Also tell them if you smoke, drink alcohol, or use illegal drugs. Some items may interact with your medicine. This list may not describe all possible interactions. Give your health care provider a list of all the medicines, herbs, non-prescription drugs, or dietary supplements you use. Also tell them if you smoke, drink alcohol, or use illegal drugs. Some items may interact with your medicine. What should I watch for while using this medicine? Your condition will be monitored carefully while you are receiving this medicine. You may need blood work done while you are taking this medicine. Do not become pregnant while taking this medicine or for 4 months after stopping it. Women should inform their doctor if they wish to become pregnant or think they might be pregnant. There is a potential for serious side effects to an unborn child. Talk to your health care professional or pharmacist for more information. Do not breast-feed an infant while  taking this medicine or for 4 months after the last dose. What side effects may I notice from receiving this medicine? Side effects that you should report to your doctor or health care professional as soon as possible:  allergic reactions like skin rash, itching or hives, swelling of the face, lips, or tongue  bloody or black, tarry  breathing problems  changes in vision  chest pain  chills  confusion  constipation  cough  diarrhea  dizziness or feeling faint or lightheaded  fast or irregular heartbeat  fever  flushing  joint pain  low blood counts - this medicine may decrease the number of white blood cells, red blood cells and platelets. You may be at increased risk for infections and bleeding.  muscle pain  muscle weakness  pain, tingling, numbness in the hands or feet  persistent headache  redness, blistering, peeling or loosening of the skin, including inside the mouth  signs and symptoms of high blood sugar such as  dizziness; dry mouth; dry skin; fruity breath; nausea; stomach pain; increased hunger or thirst; increased urination  signs and symptoms of kidney injury like trouble passing urine or change in the amount of urine  signs and symptoms of liver injury like dark urine, light-colored stools, loss of appetite, nausea, right upper belly pain, yellowing of the eyes or skin  sweating  swollen lymph nodes  weight loss Side effects that usually do not require medical attention (report to your doctor or health care professional if they continue or are bothersome):  decreased appetite  hair loss  muscle pain  tiredness This list may not describe all possible side effects. Call your doctor for medical advice about side effects. You may report side effects to FDA at 1-800-FDA-1088. Where should I keep my medicine? This drug is given in a hospital or clinic and will not be stored at home. NOTE: This sheet is a summary. It may not cover all  possible information. If you have questions about this medicine, talk to your doctor, pharmacist, or health care provider.  2020 Elsevier/Gold Standard (2019-01-23 18:07:58)    Carboplatin injection What is this medicine? CARBOPLATIN (KAR boe pla tin) is a chemotherapy drug. It targets fast dividing cells, like cancer cells, and causes these cells to die. This medicine is used to treat ovarian cancer and many other cancers. This medicine may be used for other purposes; ask your health care provider or pharmacist if you have questions. COMMON BRAND NAME(S): Paraplatin What should I tell my health care provider before I take this medicine? They need to know if you have any of these conditions:  blood disorders  hearing problems  kidney disease  recent or ongoing radiation therapy  an unusual or allergic reaction to carboplatin, cisplatin, other chemotherapy, other medicines, foods, dyes, or preservatives  pregnant or trying to get pregnant  breast-feeding How should I use this medicine? This drug is usually given as an infusion into a vein. It is administered in a hospital or clinic by a specially trained health care professional. Talk to your pediatrician regarding the use of this medicine in children. Special care may be needed. Overdosage: If you think you have taken too much of this medicine contact a poison control center or emergency room at once. NOTE: This medicine is only for you. Do not share this medicine with others. What if I miss a dose? It is important not to miss a dose. Call your doctor or health care professional if you are unable to keep an appointment. What may interact with this medicine?  medicines for seizures  medicines to increase blood counts like filgrastim, pegfilgrastim, sargramostim  some antibiotics like amikacin, gentamicin, neomycin, streptomycin, tobramycin  vaccines Talk to your doctor or health care professional before taking any of these  medicines:  acetaminophen  aspirin  ibuprofen  ketoprofen  naproxen This list may not describe all possible interactions. Give your health care provider a list of all the medicines, herbs, non-prescription drugs, or dietary supplements you use. Also tell them if you smoke, drink alcohol, or use illegal drugs. Some items may interact with your medicine. What should I watch for while using this medicine? Your condition will be monitored carefully while you are receiving this medicine. You will need important blood work done while you are taking this medicine. This drug may make you feel generally unwell. This is not uncommon, as chemotherapy can affect healthy cells as well as cancer cells. Report any side effects. Continue your course  of treatment even though you feel ill unless your doctor tells you to stop. In some cases, you may be given additional medicines to help with side effects. Follow all directions for their use. Call your doctor or health care professional for advice if you get a fever, chills or sore throat, or other symptoms of a cold or flu. Do not treat yourself. This drug decreases your body's ability to fight infections. Try to avoid being around people who are sick. This medicine may increase your risk to bruise or bleed. Call your doctor or health care professional if you notice any unusual bleeding. Be careful brushing and flossing your teeth or using a toothpick because you may get an infection or bleed more easily. If you have any dental work done, tell your dentist you are receiving this medicine. Avoid taking products that contain aspirin, acetaminophen, ibuprofen, naproxen, or ketoprofen unless instructed by your doctor. These medicines may hide a fever. Do not become pregnant while taking this medicine. Women should inform their doctor if they wish to become pregnant or think they might be pregnant. There is a potential for serious side effects to an unborn child. Talk  to your health care professional or pharmacist for more information. Do not breast-feed an infant while taking this medicine. What side effects may I notice from receiving this medicine? Side effects that you should report to your doctor or health care professional as soon as possible:  allergic reactions like skin rash, itching or hives, swelling of the face, lips, or tongue  signs of infection - fever or chills, cough, sore throat, pain or difficulty passing urine  signs of decreased platelets or bleeding - bruising, pinpoint red spots on the skin, black, tarry stools, nosebleeds  signs of decreased red blood cells - unusually weak or tired, fainting spells, lightheadedness  breathing problems  changes in hearing  changes in vision  chest pain  high blood pressure  low blood counts - This drug may decrease the number of white blood cells, red blood cells and platelets. You may be at increased risk for infections and bleeding.  nausea and vomiting  pain, swelling, redness or irritation at the injection site  pain, tingling, numbness in the hands or feet  problems with balance, talking, walking  trouble passing urine or change in the amount of urine Side effects that usually do not require medical attention (report to your doctor or health care professional if they continue or are bothersome):  hair loss  loss of appetite  metallic taste in the mouth or changes in taste This list may not describe all possible side effects. Call your doctor for medical advice about side effects. You may report side effects to FDA at 1-800-FDA-1088. Where should I keep my medicine? This drug is given in a hospital or clinic and will not be stored at home. NOTE: This sheet is a summary. It may not cover all possible information. If you have questions about this medicine, talk to your doctor, pharmacist, or health care provider.  2020 Elsevier/Gold Standard (2007-06-24 14:38:05)

## 2019-11-05 ENCOUNTER — Other Ambulatory Visit: Payer: Self-pay

## 2019-11-05 ENCOUNTER — Telehealth: Payer: Self-pay | Admitting: *Deleted

## 2019-11-05 ENCOUNTER — Ambulatory Visit
Admission: RE | Admit: 2019-11-05 | Discharge: 2019-11-05 | Disposition: A | Discharge status: Home / Self Care | Payer: Medicare HMO | Source: Ambulatory Visit | Attending: Radiation Oncology | Admitting: Radiation Oncology

## 2019-11-05 DIAGNOSIS — Z510 Encounter for antineoplastic radiation therapy: Secondary | ICD-10-CM | POA: Diagnosis not present

## 2019-11-05 DIAGNOSIS — C3411 Malignant neoplasm of upper lobe, right bronchus or lung: Secondary | ICD-10-CM | POA: Diagnosis not present

## 2019-11-05 NOTE — Telephone Encounter (Signed)
-----   Message from Jolaine Click, RN sent at 11/04/2019  3:29 PM EDT ----- Regarding: First Time Pembrolizumab, Pemetrexed, and Carboplatin - Dr. Earlie Server Patient First Time Pembrolizumab, Pemetrexed, and Carboplatin - Dr. Earlie Server Patient

## 2019-11-05 NOTE — Telephone Encounter (Signed)
Called pt to see how he did with his treatment yest & he reports doing well.  He mentioned that Sandi Mealy PA gave him some scripts that has stopped his coughing.  He denies any concerns but knows to call with any problems.

## 2019-11-05 NOTE — Progress Notes (Signed)
[facility] Progress Note   Micheal Oliver [id number] 10-02-1950 69 y.o.  Micheal Oliver is managed by Dr. Fanny Bien. Micheal Oliver  Actively treated with chemotherapy/immunotherapy/hormonal therapy: yes  Current therapy: pembrolizumab, pemetrexed, and carboplatin   Last treated: 11/04/2019 (cycle #1, day#1)  Next scheduled appointment with provider: 11/10/2019  Assessment: Plan:    Cough - Plan: SARS Coronavirus 2 (TAT 6-24 hrs)  Malignant neoplasm of right upper lobe of lung (HCC)   Productive cough: The patient was given a prescription for Augmentin 875-125 p.o. twice daily x7 days and a prescription for Hycodan cough syrup.  During his treatment he was also given an albuterol inhaler treatment.  Stage IV (T3, N2, M1a) non-small cell carcinoma of the right lung: Micheal Oliver presents to the clinic today for cycle 1, day 1 of pembrolizumab, pemetrexed, and carboplatin.  He is scheduled to be seen in follow-up on 11/10/2019.  Please see After Visit Summary for patient specific instructions.  Future Appointments  Date Time Provider [place]  11/06/2019  8:00 AM [facility] LINAC 1 [facility] None  11/09/2019  8:00 AM [facility] LINAC 1 [facility] None  11/10/2019  8:45 AM [facility] LINAC 1 [facility] None  11/10/2019  9:00 AM [facility] LAB/FLUSH [facility] None  11/10/2019  9:30 AM Heilingoetter, Cassandra L, PA-C [facility] None  11/11/2019  8:00 AM [facility] LINAC 1 [facility] None  11/12/2019  8:45 AM [facility] LINAC 1 [facility] None  11/13/2019  8:00 AM [facility] LINAC 1 [facility] None  11/16/2019  8:00 AM [facility] LINAC 1 [facility] None  11/17/2019  8:00 AM [facility] LINAC 1 [facility] None  11/17/2019  9:00 AM [facility] LAB/FLUSH [facility] None  11/18/2019  8:00 AM [facility] LINAC 1 [facility] None  11/19/2019  8:00 AM [facility] LINAC 1 [facility] None  11/20/2019  8:00 AM [facility] LINAC 1 [facility] None  11/23/2019  8:00 AM  [facility] LINAC 1 [facility] None  11/26/2019 11:00 AM [facility] LAB/FLUSH [facility] None  11/26/2019 11:30 AM Heilingoetter, Cassandra L, PA-C [facility] None  11/26/2019 12:30 PM [facility] INFUSION [facility] None  12/02/2019  9:00 AM [facility] LAB 4 [facility] None  12/08/2019  4:00 PM Camnitz, Will Hassell Done, MD [facility]  12/09/2019  9:00 AM [facility] LAB 6 [facility] None  12/14/2019  1:30 PM Penumalli, Earlean Polka, MD [facility] None  12/16/2019  9:00 AM [facility]/FLUSH [facility] None  12/16/2019  9:45 AM Curt Bears, MD [facility] None  12/16/2019 10:30 AM [facility] INFUSION [facility] None  12/24/2019  8:20 AM Copland, Gay Filler, MD [facility]    Orders Placed This Encounter  Procedures  . SARS Coronavirus 2 (TAT 6-24 hrs)       Subjective:   Patient ID:  Micheal Oliver is a 69 y.o. (DOB Dec 11, 1950) male.  Chief Complaint: No chief complaint on file.   HPI Micheal Oliver is a 69 y.o. male with a diagnosis of a stage IV (T3, N2, M1a) non-small cell carcinoma of the right lung. He was seen in infusion today as he was receiving cycle 1, day 1 of pembrolizumab, pemetrexed, and carboplatin.  He has had an ongoing cough which is intermittently productive.  He is also had nasal discharge and sneezing.  He has had a history of sinus infections but does not believe that his symptoms are consistent with a recurrent sinus infection.  He denies fevers, chills, or sweats.  Medications: I have reviewed the patient's current medications.  Allergies:  Allergies  Allergen Reactions  . Bee Venom     Lip swelling  Past Medical History:  Diagnosis Date  . Chicken pox   . DM2 (diabetes mellitus, type 2) ([place])   . History of kidney stones    passed several   . Hypertension   . Kidney stones    5-6 times  . Measles   . Mumps   . Stroke ([place])    x 2, last one was 09/18/2019- has some expressive aphasia    Past Surgical History:  Procedure Laterality Date   . COLONOSCOPY W/ POLYPECTOMY    . VIDEO BRONCHOSCOPY WITH ENDOBRONCHIAL ULTRASOUND N/A 09/30/2019   Procedure: VIDEO BRONCHOSCOPY WITH ENDOBRONCHIAL ULTRASOUND;  Surgeon: Collene Gobble, MD;  Location: [facility];  Service: Thoracic;  Laterality: N/A;  . WISDOM TOOTH EXTRACTION  1976-77    Family History  Problem Relation Age of Onset  . Alcohol abuse Father 60       Deceased  . Heart disease Father   . Lung disease Father   . Hypertension Father   . Stomach cancer Mother 35       Deceased  . Hypertension Paternal Grandmother   . Dementia Paternal Grandmother   . Stroke Paternal Grandmother   . Arthritis Maternal Grandmother   . Sudden death Paternal Grandfather   . Sudden death Maternal Grandfather   . Heart attack Paternal Aunt   . Other Maternal Aunt        Natural Causes  . Healthy Son   . Anxiety disorder Daughter     Social History   Socioeconomic History  . Marital status: Married    Spouse name: [name]  . Number of children: 2  . Years of education: MBA  . Highest education level: Not on file  Occupational History    Comment: retired, Banker   Tobacco Use  . Smoking status: Never Smoker  . Smokeless tobacco: Never Used  Vaping Use  . Vaping Use: Never used  Substance and Sexual Activity  . Alcohol use: No  . Drug use: No  . Sexual activity: Yes  Other Topics Concern  . Not on file  Social History Narrative   Lives at home with wife   Caffeine use- sweet tea, 2 glasses a day   Social Determinants of Health   Financial Resource Strain:   . Difficulty of Paying Living Expenses:   Food Insecurity:   . Worried About Charity fundraiser in the Last Year:   . Arboriculturist in the Last Year:   Transportation Needs:   . Film/video editor (Medical):   Marland Kitchen Lack of Transportation (Non-Medical):   Physical Activity:   . Days of Exercise per Week:   . Minutes of Exercise per Session:   Stress:   . Feeling of Stress :   Social Connections:   .  Frequency of Communication with Friends and Family:   . Frequency of Social Gatherings with Friends and Family:   . Attends Religious Services:   . Active Member of Clubs or Organizations:   . Attends Archivist Meetings:   Marland Kitchen Marital Status:   Intimate Partner Violence:   . Fear of Current or Ex-Partner:   . Emotionally Abused:   Marland Kitchen Physically Abused:   . Sexually Abused:     Past Medical History, Surgical history, Social history, and Family history were reviewed and updated as appropriate.   Please see review of systems for further details on the patient's review from today.   Review of Systems:  Review of Systems  Constitutional:  Negative for chills, diaphoresis, fatigue and fever.  HENT: Positive for rhinorrhea. Negative for congestion, postnasal drip and sore throat.   Respiratory: Positive for cough. Negative for shortness of breath and wheezing.   Cardiovascular: Negative for palpitations.  Neurological: Negative for headaches.    Objective:   Physical Exam:  There were no vitals taken for this visit. ECOG: 0  Physical Exam Constitutional:      General: He is not in acute distress.    Appearance: He is not diaphoretic.  HENT:     Head: Normocephalic and atraumatic.     Right Ear: External ear normal.     Left Ear: External ear normal.     Mouth/Throat:     Pharynx: No oropharyngeal exudate.  Eyes:     General: No scleral icterus.       Right eye: No discharge.        Left eye: No discharge.  Cardiovascular:     Rate and Rhythm: Normal rate and regular rhythm.     Heart sounds: Normal heart sounds. No murmur heard.  No friction rub. No gallop.   Pulmonary:     Effort: Pulmonary effort is normal. No respiratory distress.     Breath sounds: Rhonchi present. No wheezing or rales.     Comments: Coarse rhonchi throughout all lung fields. Musculoskeletal:     Cervical back: Normal range of motion and neck supple.  Lymphadenopathy:     Cervical: No  cervical adenopathy.  Skin:    General: Skin is warm and dry.     Findings: No erythema or rash.  Neurological:     Mental Status: He is alert.  Psychiatric:        Behavior: Behavior normal.        Thought Content: Thought content normal.        Judgment: Judgment normal.     Lab Review:     Component Value Date/Time   NA 138 11/04/2019 1144   K 4.3 11/04/2019 1144   CL 106 11/04/2019 1144   CO2 20 (L) 11/04/2019 1144   GLUCOSE 208 (H) 11/04/2019 1144   BUN 21 11/04/2019 1144   CREATININE 1.31 (H) 11/04/2019 1144   CREATININE 1.13 10/16/2013 1118   CALCIUM 9.9 11/04/2019 1144   PROT 7.4 11/04/2019 1144   ALBUMIN 3.9 11/04/2019 1144   AST 23 11/04/2019 1144   ALT 24 11/04/2019 1144   ALKPHOS 91 11/04/2019 1144   BILITOT 1.2 11/04/2019 1144   GFRNONAA 56 (L) 11/04/2019 1144   GFRAA >60 11/04/2019 1144       Component Value Date/Time   WBC 8.7 11/04/2019 1144   WBC 10.6 (H) 09/24/2019 1203   RBC 4.53 11/04/2019 1144   HGB 13.6 11/04/2019 1144   HCT 39.7 11/04/2019 1144   PLT 169 11/04/2019 1144   MCV 87.6 11/04/2019 1144   MCH 30.0 11/04/2019 1144   MCHC 34.3 11/04/2019 1144   RDW 12.9 11/04/2019 1144   LYMPHSABS 1.1 11/04/2019 1144   MONOABS 0.8 11/04/2019 1144   EOSABS 0.2 11/04/2019 1144   BASOSABS 0.1 11/04/2019 1144   -------------------------------  Imaging from last 24 hours (if applicable):  Radiology interpretation: NM PET Image Initial (PI) Skull Base To Thigh  Result Date: 10/26/2019 CLINICAL DATA:  Initial treatment strategy for non-small cell lung cancer. EXAM: NUCLEAR MEDICINE PET SKULL BASE TO THIGH TECHNIQUE: 10.5 mCi F-18 FDG was injected intravenously. Full-ring PET imaging was performed from the skull base to thigh after the radiotracer.  CT data was obtained and used for attenuation correction and anatomic localization. Fasting blood glucose: 233 mg/dl COMPARISON:  October 26, 2019 FINDINGS: Mediastinal blood pool activity: SUV max 2.88 Liver  activity: SUV max NA NECK: No hypermetabolic lymph nodes in the neck. LEFT hemi thyroid in the area of the nodule identified on recent CT and ultrasound shows SUV of 3.3 Incidental CT findings: none CHEST: Large RIGHT paramediastinal and juxta hilar mass extending into the RIGHT upper lobe (image 23 of series 8) 6.2 x 4.5 cm (SUVmax = 8.6) Multiple tiny pulmonary nodules throughout both the LEFT and RIGHT chest as seen on recent CT evaluation of September 19, 2019 noncalcified nodule (image 43, series 8) 7 mm (SUVmax = 1.2) Example of another small pulmonary nodule in the RIGHT lower lobe (image 30, series 8) 6 mm also with uptake less than blood pool activity. Adenopathy in the mediastinum (image 71, series 4) 12 mm (SUVmax = 4.7) Subcarinal nodal enlargement (image 78, series 4) (SUVmax = 4.24) AP window lymph node with enlargement (image 72, series 4) 1 cm (SUVmax = 3.2) Incidental CT findings: none ABDOMEN/PELVIS: No abnormal hypermetabolic activity within the liver, pancreas, adrenal glands, or spleen. No hypermetabolic lymph nodes in the abdomen or pelvis. Incidental CT findings: Signs of diverticular disease. Some mildly asymmetric colonic wall thickening in the distal sigmoid (image 176) nonfocal FDG uptake is demonstrated throughout the: Particularly the distal colon in the setting of diverticular changes. SKELETON: No focal hypermetabolic activity to suggest skeletal metastasis. Incidental CT findings: none IMPRESSION: 1. Mass in the RIGHT upper lobe showing increased metabolic activity compatible with known malignancy in this location. Mass again displays extension into mediastinal fat and obstruction of upper lobe bronchi. 2. Multiple pulmonary nodules suspicious for metastatic disease similar to recent CT evaluation, not showing FDG uptake beyond blood pool activity. 3. Mediastinal lymph nodes with hypermetabolic features including RIGHT paratracheal and subcarinal lymph nodes. Mildly enlarged AP window  lymph node is FDG avid slightly above mediastinal blood pool as discussed. 4. Findings of diverticular disease with more focal thickening of the distal sigmoid as discussed. Would correlate with recent colonoscopy results or with colonoscopy if no recent colonoscopy has been performed. Generalized uptake associated with these changes is more likely related to low level inflammation and is more diffuse than expected for malignancy. Electronically Signed   By: Zetta Bills M.D.   On: 10/26/2019 09:28

## 2019-11-06 ENCOUNTER — Ambulatory Visit
Admission: RE | Admit: 2019-11-06 | Discharge: 2019-11-06 | Disposition: A | Discharge status: Home / Self Care | Payer: Medicare HMO | Source: Ambulatory Visit | Attending: Radiation Oncology | Admitting: Radiation Oncology

## 2019-11-06 ENCOUNTER — Other Ambulatory Visit: Payer: Self-pay

## 2019-11-06 DIAGNOSIS — C3411 Malignant neoplasm of upper lobe, right bronchus or lung: Secondary | ICD-10-CM | POA: Diagnosis not present

## 2019-11-06 DIAGNOSIS — Z510 Encounter for antineoplastic radiation therapy: Secondary | ICD-10-CM | POA: Diagnosis not present

## 2019-11-06 MED ORDER — SONAFINE EX EMUL
1.0000 "application " | Freq: Once | CUTANEOUS | Status: AC
  Administered 2019-11-06: 1 via TOPICAL

## 2019-11-06 NOTE — Progress Notes (Signed)

## 2019-11-08 NOTE — Progress Notes (Signed)
[facility] OFFICE PROGRESS NOTE  Copland, Gay Filler, MD [address] [place] [address]  DIAGNOSIS: stage IV (T3, N2, M1a) non-small cell lung cancer, adenocarcinoma presented with large right upper lobe lung mass extending to the right suprahilar region in addition to right hilar and mediastinal lymphadenopathy with obstruction of the associated upper lobe bronchus and bilateral subcentimeter pulmonary nodules diagnosed in July 2021.  Biomarker Findings Microsatellite status - MS-Stable Tumor Mutational Burden - 3 Muts/Mb Genomic Findings For a complete list of the genes assayed, please refer to the Appendix. ERBB2 [id number], amplification - equivocal? HGF amplification - equivocal? MTAP loss exons 5-8 MYC amplification - equivocal? CDKN2A/B CDKN2B loss, CDKN2A loss CUL3 Y20f*19 RPTOR amplification TP53 [id number] Disease relevant genes with no reportable alterations: ALK, BRAF, EGFR, KRAS, MET, RET, ROS1  PDL1 Expression: Negative  PRIOR THERAPY: None  CURRENT THERAPY:  1) Systemic chemotherapy with carboplatin for AUC of 5, Alimta 500 mg/M2 and Keytruda 200 mg IV every 3 weeks.  First dose November 04, 2019. Status post 1 cycle.  2) Palliative radiotherapy to the obstructive lung mass under the care of Dr. MoLisbeth RenshawThe past treatment is scheduled for   INTERVAL HISTORY: WiEisen Robenson89.o. male returns to the clinic today for a follow up visit. The patient is feeling fair today without any concerning complaints except for a cough which has been going on for several weeks.  The patient was seen by the symptom management clinic last week and was given Hycodan as well as albuterol as well as Augmentin for his cough and nasal congestion.   The patient has been using Hycodan as well as his Hailer which has been helpful.  The patient had only been taking his Augmentin as needed.  He reportedly has more than half of the prescription left at  this time.  He denies any sick contacts.  The patient received his Covid vaccinations.  He was tested for Covid last week which was negative.  The patient states that his nasal congestion as well as of his cough makes it challenging to sleep.  He uses NyQuil which has been helpful.  He denies any known fevers, night sweats, or sore throat.  He reports some associated chest discomfort from coughing.  He denies any hemoptysis.  The patient tolerated his first treatment with chemotherapy well without any adverse effects.  He is currently undergoing palliative radiotherapy to the obstructive lung mass under the care of Dr. MoLisbeth RenshawHis last treatment is scheduled for 8/23.  The patient has lost approximately 10 pounds since his last appointment significant.  The patient attributes this to food tasting abnormal.  Denies any nausea, vomiting, diarrhea, or constipation.  He has had a mild headache in both his temples bilaterally which resolves with the use of Tylenol.. Denies any rashes or skin changes. The patient is here today for evaluation and a one week follow up visit.    MEDICAL HISTORY: Past Medical History:  Diagnosis Date  . Chicken pox   . DM2 (di[place], type 2) [place]  . History of kidney stones    passed several   . Hypertension   . Kidney stones    5-6 times  . Measles   . Mumps   . [place] ([facility]   x 2, last one was 09/18/2019- has some expressive aphasia    ALLERGIES:  is allergic to bee venom.  MEDICATIONS:  Current Outpatient Medications  Medication Sig Dispense  Refill  . acetaminophen (TYLENOL) 500 MG tablet Take 500 mg by mouth every 6 (six) hours as needed.    Marland Kitchen amoxicillin-clavulanate (AUGMENTIN) 875-125 MG tablet Take 1 tablet by mouth 2 (two) times daily. 14 tablet 0  . aspirin 81 MG tablet Take 81 mg by mouth daily.    Marland Kitchen atorvastatin (LIPITOR) 40 MG tablet Take 1 tablet (40 mg total) by mouth daily at 6 PM. 90 tablet 3  . clopidogrel (PLAVIX) 75 MG tablet Take 1  tablet (75 mg total) by mouth daily. Okay to restart this medication on 10/01/2019. 90 tablet 3  . folic acid (FOLVITE) 1 MG tablet Take 1 tablet (1 mg total) by mouth daily. 30 tablet 4  . gabapentin (NEURONTIN) 300 MG capsule Take 1 capsule (300 mg total) by mouth at bedtime. 90 capsule 3  . HYDROcodone-homatropine (HYCODAN) 5-1.5 MG/5ML syrup Take 5 mLs by mouth every 6 (six) hours as needed for cough. 120 mL 0  . hydrOXYzine (ATARAX/VISTARIL) 25 MG tablet Take 1 tablet (25 mg total) by mouth 3 (three) times daily as needed for itching. 60 tablet 0  . lisinopril (ZESTRIL) 10 MG tablet Take 20 mg by mouth daily.    . metFORMIN (GLUCOPHAGE) 500 MG tablet TAKE TWO TABLETS BY MOUTH TWICE A DAY WITH MEAL(S) 360 tablet 3  . Omega-3 Fatty Acids (FISH OIL) 1200 MG CAPS Take by mouth daily.    . prochlorperazine (COMPAZINE) 10 MG tablet Take 10 mg by mouth every 6 (six) hours as needed.    . fluconazole (DIFLUCAN) 100 MG tablet Take 1 tablet (100 mg total) by mouth daily. 7 tablet 0  . methylPREDNISolone (MEDROL DOSEPAK) 4 MG TBPK tablet Use as instructed 21 tablet 0   No current facility-administered medications for this visit.    SURGICAL HISTORY:  Past Surgical History:  Procedure Laterality Date  . COLONOSCOPY W/ POLYPECTOMY    . VIDEO BRONCHOSCOPY WITH ENDOBRONCHIAL ULTRASOUND N/A 09/30/2019   Procedure: VIDEO BRONCHOSCOPY WITH ENDOBRONCHIAL ULTRASOUND;  Surgeon: Collene Gobble, MD;  Location: [facility];  Service: Thoracic;  Laterality: N/A;  . WISDOM TOOTH EXTRACTION  1976-77    REVIEW OF SYSTEMS:   Review of Systems  Constitutional: Positive for fatigue, appetite change, and weight loss.  Negative for chills and fever.  HENT: Positive for taste alterations and nasal congestion.  Negative for mouth sores, nosebleeds, sore throat and trouble swallowing.   Eyes: Negative for eye problems and icterus.  Respiratory: Positive for cough, occasional wheezing, and dyspnea on exertion.  Negative for  hemoptysis. Cardiovascular: Positive for chest discomfort with coughing. negative for leg swelling.  Gastrointestinal: Negative for abdominal pain, constipation, diarrhea, nausea and vomiting.  Genitourinary: Negative for bladder incontinence, difficulty urinating, dysuria, frequency and hematuria.   Musculoskeletal: Negative for back pain, gait problem, neck pain and neck stiffness.  Skin: Negative for itching and rash.  Neurological: Positive for occasional mild temporal headaches. Negative for dizziness, extremity weakness, gait problem,  light-headedness and seizures.  Hematological: Negative for adenopathy. Does not bruise/bleed easily.  Psychiatric/Behavioral: Negative for confusion, depression and sleep disturbance. The patient is not nervous/anxious.     PHYSICAL EXAMINATION:  Blood pressure 100/73, pulse (!) 133, temperature 99.5 F (37.5 C), temperature source Oral, resp. rate 20, height 6' 2" (1.88 m), weight 199 lb 9.6 oz (90.5 kg), SpO2 95 %.  ECOG PERFORMANCE STATUS: 1 - Symptomatic but completely ambulatory  Physical Exam  Constitutional: Oriented to person, place, and time and well-developed, well-nourished, and in  no distress.  HENT:  Head: Normocephalic and atraumatic.  Mouth/Throat: White coating on tongue noted on exam.  Eyes: Conjunctivae are normal. Right eye exhibits no discharge. Left eye exhibits no discharge. No scleral icterus.  Neck: Normal range of motion. Neck supple.  Cardiovascular: Normal rate, regular rhythm, normal heart sounds and intact distal pulses.   Pulmonary/Chest: Effort normal.  Wheezing bilaterally. no respiratory distress.  No rales.  Abdominal: Soft. Bowel sounds are normal. Exhibits no distension and no mass. There is no tenderness.  Musculoskeletal: Normal range of motion. Exhibits no edema.  Lymphadenopathy:    No cervical adenopathy.  Neurological: Alert and oriented to person, place, and time. Exhibits normal muscle tone. Gait normal.  Coordination normal.  Skin: Skin is warm and dry. No rash noted. Not diaphoretic. No erythema. No pallor.  Psychiatric: Mood, memory and judgment normal.  Vitals reviewed.  LABORATORY DATA: Lab Results  Component Value Date   WBC 4.4 11/10/2019   HGB 13.8 11/10/2019   HCT 40.9 11/10/2019   MCV 87.2 11/10/2019   PLT 129 (L) 11/10/2019      Chemistry      Component Value Date/Time   NA 135 11/10/2019 0923   K 5.1 11/10/2019 0923   CL 103 11/10/2019 0923   CO2 21 (L) 11/10/2019 0923   BUN 33 (H) 11/10/2019 0923   CREATININE 1.48 (H) 11/10/2019 0923   CREATININE 1.13 10/16/2013 1118      Component Value Date/Time   CALCIUM 9.9 11/10/2019 0923   ALKPHOS 81 11/10/2019 0923   AST 18 11/10/2019 0923   ALT 25 11/10/2019 0923   BILITOT 1.3 (H) 11/10/2019 0923       RADIOGRAPHIC STUDIES:  NM PET Image Initial (PI) Skull Base To Thigh  Result Date: 10/26/2019 CLINICAL DATA:  Initial treatment strategy for non-small cell lung cancer. EXAM: NUCLEAR MEDICINE PET SKULL BASE TO THIGH TECHNIQUE: 10.5 mCi F-18 FDG was injected intravenously. Full-ring PET imaging was performed from the skull base to thigh after the radiotracer. CT data was obtained and used for attenuation correction and anatomic localization. Fasting blood glucose: 233 mg/dl COMPARISON:  October 26, 2019 FINDINGS: Mediastinal blood pool activity: SUV max 2.88 Liver activity: SUV max NA NECK: No hypermetabolic lymph nodes in the neck. LEFT hemi thyroid in the area of the nodule identified on recent CT and ultrasound shows SUV of 3.3 Incidental CT findings: none CHEST: Large RIGHT paramediastinal and juxta hilar mass extending into the RIGHT upper lobe (image 23 of series 8) 6.2 x 4.5 cm (SUVmax = 8.6) Multiple tiny pulmonary nodules throughout both the LEFT and RIGHT chest as seen on recent CT evaluation of September 19, 2019 noncalcified nodule (image 43, series 8) 7 mm (SUVmax = 1.2) Example of another small pulmonary nodule in the  RIGHT lower lobe (image 30, series 8) 6 mm also with uptake less than blood pool activity. Adenopathy in the mediastinum (image 71, series 4) 12 mm (SUVmax = 4.7) Subcarinal nodal enlargement (image 78, series 4) (SUVmax = 4.24) AP window lymph node with enlargement (image 72, series 4) 1 cm (SUVmax = 3.2) Incidental CT findings: none ABDOMEN/PELVIS: No abnormal hypermetabolic activity within the liver, pancreas, adrenal glands, or spleen. No hypermetabolic lymph nodes in the abdomen or pelvis. Incidental CT findings: Signs of diverticular disease. Some mildly asymmetric colonic wall thickening in the distal sigmoid (image 176) nonfocal FDG uptake is demonstrated throughout the: Particularly the distal colon in the setting of diverticular changes. SKELETON: No focal hypermetabolic  activity to suggest skeletal metastasis. Incidental CT findings: none IMPRESSION: 1. Mass in the RIGHT upper lobe showing increased metabolic activity compatible with known malignancy in this location. Mass again displays extension into mediastinal fat and obstruction of upper lobe bronchi. 2. Multiple pulmonary nodules suspicious for metastatic disease similar to recent CT evaluation, not showing FDG uptake beyond blood pool activity. 3. Mediastinal lymph nodes with hypermetabolic features including RIGHT paratracheal and subcarinal lymph nodes. Mildly enlarged AP window lymph node is FDG avid slightly above mediastinal blood pool as discussed. 4. Findings of diverticular disease with more focal thickening of the distal sigmoid as discussed. Would correlate with recent colonoscopy results or with colonoscopy if no recent colonoscopy has been performed. Generalized uptake associated with these changes is more likely related to low level inflammation and is more diffuse than expected for malignancy. Electronically Signed   By: Zetta Bills M.D.   On: 10/26/2019 09:28     ASSESSMENT/PLAN:  This is a very pleasant 69 year old Caucasian  male recently diagnosed with a stage IV (T3, N2, M1 a) non-small cell lung cancer, adenocarcinoma presented with large right upper lobe lung mass with extension to the right suprahilar region as well as right hilar and mediastinal lymphadenopathy with obstruction of the right upper lobe and bilateral pulmonary nodules diagnosed in July 2021. Negative PDL1 expression. He has no actionable mutations.   The patient is currently undergoing palliative radiotherapy to the obstructive lung mass under the care of Dr. Lisbeth Renshaw. The last treatment is scheduled for 11/23/19  He is currently undergoing palliative systemic chemotherapy with carboplatin for an AUC of 5, Alimta 500 mg/m2, and Keytruda 200 mg IV every 3 weeks. He is status post 1 cycle and tolerated it well.  The patient was seen with Dr. Julien Nordmann. Labs were reviewed. Recommend he continue on the same treatment at the same dose.   We will see him back for a follow up visit in 2 weeks for evaluation before starting cycle #2.   Regarding his cough and nasal congestion, the patient was educated that Augmentin is not to be taken as an as needed medicine.  He was instructed to take this as prescribed.  Additionally, we will send a Medrol Dosepak to his pharmacy to help with his cough which may be secondary to his cancer as well possible inflammatory process due to his recent initiation of radiotherapy.  Discussed with the patient to monitor his blood sugar as this may cause hyperglycemia as well as to take his steroids in the morning due to insomnia.  He will continue to use Hycodan symptomatically for his cough.  He was also instructed to drink warm liquids.  If he does not have improvement in his symptoms, he was instructed to call us back for further evaluation in a week or so.  Initially, it was reported during vitals check by his temperature was 101.9.  Upon retaking his temperature his temperature was 99.5.  The patient was instructed to monitor for fever  at home.  The patient is not neutropenic, nontoxic-appearing, and his Covid test from last week was negative.  For his nasal congestion, he was instructed to use saline nasal spray as well as use Mucinex if needed.   The patient lost a significant amount of weight since his appointment on 7/29.  The patient drinks Ensure at home and was encouraged to increase his oral intake.  Additionally his creatinine was slightly elevated today.  He was instructed to increase his fluid  intake.  The patient noted some taste alterations and his exam noted evidence of oral thrush.  I will send a prescription of Diflucan 1 tablet daily for 7 days to his pharmacy.  The patient was advised to call immediately if she has any concerning symptoms in the interval. The patient voices understanding of current disease status and treatment options and is in agreement with the current care plan. All questions were answered. The patient knows to call the clinic with any problems, questions or concerns. We can certainly see the patient much sooner if necessary  No orders of the defined types were placed in this encounter.    Sharlon Pfohl L Tydus Sanmiguel, PA-C 11/10/19  ADDENDUM: Hematology/Oncology Attending: I had a face-to-face encounter with the patient today.  I recommended his care plan.  This is a very pleasant 69 years old white male recently diagnosed with stage IV non-small cell lung cancer presented with right upper lobe lung mass in addition to right hilar, mediastinal lymphadenopathy and bilateral pulmonary nodules.  The patient has no actionable mutations and PD-L1 expression was negative.  The patient is currently undergoing systemic chemotherapy with carboplatin, Alimta and Keytruda status post 1 cycle last week. He has been complaining of cough, chest congestion and lack of sleep and appetite for the last few weeks even before starting treatment.  He tried several medication including Hycodan, NyQuil, albuterol  and Augmentin but he did not take his treatment with Augmentin as prescribed. He was tested for COVID-19 and it was reported to be negative. The patient started palliative radiotherapy to the obstructive lung mass. I recommended for him to take the Augmentin as prescribed twice daily.  We will also start the patient on Medrol Dosepak for the next week. If he has no improvement in his condition he was advised to call back or go to the emergency room for evaluation. The patient agreed to the current plan. Disclaimer: This note was dictated with voice recognition software. Similar sounding words can inadvertently be transcribed and may be missed upon review. Eilleen Kempf, MD 11/10/19

## 2019-11-09 ENCOUNTER — Ambulatory Visit
Admission: RE | Admit: 2019-11-09 | Discharge: 2019-11-09 | Disposition: A | Discharge status: Home / Self Care | Payer: Medicare HMO | Source: Ambulatory Visit | Attending: Radiation Oncology | Admitting: Radiation Oncology

## 2019-11-09 ENCOUNTER — Other Ambulatory Visit: Payer: Self-pay

## 2019-11-09 ENCOUNTER — Ambulatory Visit: Payer: Medicare HMO | Admitting: Emergency Medicine

## 2019-11-09 DIAGNOSIS — Z510 Encounter for antineoplastic radiation therapy: Secondary | ICD-10-CM | POA: Diagnosis not present

## 2019-11-09 DIAGNOSIS — C3411 Malignant neoplasm of upper lobe, right bronchus or lung: Secondary | ICD-10-CM | POA: Diagnosis not present

## 2019-11-10 ENCOUNTER — Ambulatory Visit
Admission: RE | Admit: 2019-11-10 | Discharge: 2019-11-10 | Disposition: A | Discharge status: Home / Self Care | Payer: Medicare HMO | Source: Ambulatory Visit | Attending: Radiation Oncology | Admitting: Radiation Oncology

## 2019-11-10 ENCOUNTER — Other Ambulatory Visit: Payer: Self-pay | Admitting: Physician Assistant

## 2019-11-10 ENCOUNTER — Inpatient Hospital Stay: Payer: Medicare HMO

## 2019-11-10 ENCOUNTER — Ambulatory Visit: Payer: Medicare HMO | Admitting: Internal Medicine

## 2019-11-10 ENCOUNTER — Other Ambulatory Visit: Payer: Medicare HMO

## 2019-11-10 ENCOUNTER — Inpatient Hospital Stay: Payer: Medicare HMO | Admitting: Physician Assistant

## 2019-11-10 ENCOUNTER — Encounter: Payer: Self-pay | Admitting: Physician Assistant

## 2019-11-10 ENCOUNTER — Other Ambulatory Visit: Payer: Self-pay

## 2019-11-10 VITALS — BP 100/73 | HR 133 | Temp 99.5°F | Resp 20 | Ht 74.0 in | Wt 199.6 lb

## 2019-11-10 DIAGNOSIS — B370 Candidal stomatitis: Secondary | ICD-10-CM

## 2019-11-10 DIAGNOSIS — R05 Cough: Secondary | ICD-10-CM | POA: Diagnosis not present

## 2019-11-10 DIAGNOSIS — C3411 Malignant neoplasm of upper lobe, right bronchus or lung: Secondary | ICD-10-CM | POA: Diagnosis not present

## 2019-11-10 DIAGNOSIS — Z5111 Encounter for antineoplastic chemotherapy: Secondary | ICD-10-CM | POA: Diagnosis not present

## 2019-11-10 DIAGNOSIS — R059 Cough, unspecified: Secondary | ICD-10-CM | POA: Insufficient documentation

## 2019-11-10 DIAGNOSIS — Z510 Encounter for antineoplastic radiation therapy: Secondary | ICD-10-CM | POA: Diagnosis not present

## 2019-11-10 LAB — CBC WITH DIFFERENTIAL (CANCER CENTER ONLY)
Abs Immature Granulocytes: 0.03 10*3/uL (ref 0.00–0.07)
Basophils Absolute: 0 10*3/uL (ref 0.0–0.1)
Basophils Relative: 1 %
Eosinophils Absolute: 0 10*3/uL (ref 0.0–0.5)
Eosinophils Relative: 1 %
HCT: 40.9 % (ref 39.0–52.0)
Hemoglobin: 13.8 g/dL (ref 13.0–17.0)
Immature Granulocytes: 1 %
Lymphocytes Relative: 7 %
Lymphs Abs: 0.3 10*3/uL — ABNORMAL LOW (ref 0.7–4.0)
MCH: 29.4 pg (ref 26.0–34.0)
MCHC: 33.7 g/dL (ref 30.0–36.0)
MCV: 87.2 fL (ref 80.0–100.0)
Monocytes Absolute: 0.1 10*3/uL (ref 0.1–1.0)
Monocytes Relative: 1 %
Neutro Abs: 3.9 10*3/uL (ref 1.7–7.7)
Neutrophils Relative %: 89 %
Platelet Count: 129 10*3/uL — ABNORMAL LOW (ref 150–400)
RBC: 4.69 MIL/uL (ref 4.22–5.81)
RDW: 12.5 % (ref 11.5–15.5)
WBC Count: 4.4 10*3/uL (ref 4.0–10.5)
nRBC: 0 % (ref 0.0–0.2)

## 2019-11-10 LAB — CMP (CANCER CENTER ONLY)
ALT: 25 U/L (ref 0–44)
AST: 18 U/L (ref 15–41)
Albumin: 3.6 g/dL (ref 3.5–5.0)
Alkaline Phosphatase: 81 U/L (ref 38–126)
Anion gap: 11 (ref 5–15)
BUN: 33 mg/dL — ABNORMAL HIGH (ref 8–23)
CO2: 21 mmol/L — ABNORMAL LOW (ref 22–32)
Calcium: 9.9 mg/dL (ref 8.9–10.3)
Chloride: 103 mmol/L (ref 98–111)
Creatinine: 1.48 mg/dL — ABNORMAL HIGH (ref 0.61–1.24)
GFR, Est AFR Am: 56 mL/min — ABNORMAL LOW (ref >60)
GFR, Estimated: 48 mL/min — ABNORMAL LOW (ref >60)
Glucose, Bld: 208 mg/dL — ABNORMAL HIGH (ref 70–99)
Potassium: 5.1 mmol/L (ref 3.5–5.1)
Sodium: 135 mmol/L (ref 135–145)
Total Bilirubin: 1.3 mg/dL — ABNORMAL HIGH (ref 0.3–1.2)
Total Protein: 7.3 g/dL (ref 6.5–8.1)

## 2019-11-10 MED ORDER — FLUCONAZOLE 100 MG PO TABS: 100.0000 mg | ORAL_TABLET | Freq: Every day | ORAL | 0 refills | Status: DC

## 2019-11-10 MED ORDER — METHYLPREDNISOLONE 4 MG PO TBPK
ORAL_TABLET | ORAL | 0 refills | Status: DC
Start: 2019-11-10 — End: 2019-11-26

## 2019-11-10 MED ORDER — NYSTATIN 100000 UNIT/ML MT SUSP: 5.0000 mL | Freq: Four times a day (QID) | OROMUCOSAL | 0 refills | Status: DC

## 2019-11-11 ENCOUNTER — Ambulatory Visit
Admission: RE | Admit: 2019-11-11 | Discharge: 2019-11-11 | Disposition: A | Discharge status: Home / Self Care | Payer: Medicare HMO | Source: Ambulatory Visit | Attending: Radiation Oncology | Admitting: Radiation Oncology

## 2019-11-11 ENCOUNTER — Other Ambulatory Visit: Payer: Medicare HMO

## 2019-11-11 ENCOUNTER — Other Ambulatory Visit: Payer: Self-pay

## 2019-11-11 DIAGNOSIS — Z510 Encounter for antineoplastic radiation therapy: Secondary | ICD-10-CM | POA: Diagnosis not present

## 2019-11-11 DIAGNOSIS — C3411 Malignant neoplasm of upper lobe, right bronchus or lung: Secondary | ICD-10-CM | POA: Diagnosis not present

## 2019-11-12 ENCOUNTER — Ambulatory Visit
Admission: RE | Admit: 2019-11-12 | Discharge: 2019-11-12 | Disposition: A | Discharge status: Home / Self Care | Payer: Medicare HMO | Source: Ambulatory Visit | Attending: Radiation Oncology | Admitting: Radiation Oncology

## 2019-11-12 ENCOUNTER — Other Ambulatory Visit: Payer: Self-pay

## 2019-11-12 DIAGNOSIS — Z510 Encounter for antineoplastic radiation therapy: Secondary | ICD-10-CM | POA: Diagnosis not present

## 2019-11-12 DIAGNOSIS — C3411 Malignant neoplasm of upper lobe, right bronchus or lung: Secondary | ICD-10-CM | POA: Diagnosis not present

## 2019-11-13 ENCOUNTER — Ambulatory Visit
Admission: RE | Admit: 2019-11-13 | Discharge: 2019-11-13 | Disposition: A | Discharge status: Home / Self Care | Payer: Medicare HMO | Source: Ambulatory Visit | Attending: Radiation Oncology | Admitting: Radiation Oncology

## 2019-11-13 ENCOUNTER — Other Ambulatory Visit: Payer: Self-pay

## 2019-11-13 ENCOUNTER — Other Ambulatory Visit: Payer: Self-pay | Admitting: Family Medicine

## 2019-11-13 DIAGNOSIS — C3411 Malignant neoplasm of upper lobe, right bronchus or lung: Secondary | ICD-10-CM | POA: Diagnosis not present

## 2019-11-13 DIAGNOSIS — Z510 Encounter for antineoplastic radiation therapy: Secondary | ICD-10-CM | POA: Diagnosis not present

## 2019-11-16 ENCOUNTER — Other Ambulatory Visit: Payer: Self-pay | Admitting: Radiation Oncology

## 2019-11-16 ENCOUNTER — Other Ambulatory Visit: Payer: Self-pay

## 2019-11-16 ENCOUNTER — Ambulatory Visit
Admission: RE | Admit: 2019-11-16 | Discharge: 2019-11-16 | Disposition: A | Discharge status: Home / Self Care | Payer: Medicare HMO | Source: Ambulatory Visit | Attending: Radiation Oncology | Admitting: Radiation Oncology

## 2019-11-16 DIAGNOSIS — C3411 Malignant neoplasm of upper lobe, right bronchus or lung: Secondary | ICD-10-CM | POA: Diagnosis not present

## 2019-11-16 DIAGNOSIS — Z510 Encounter for antineoplastic radiation therapy: Secondary | ICD-10-CM | POA: Diagnosis not present

## 2019-11-16 MED ORDER — HYDROCODONE-HOMATROPINE 5-1.5 MG/5ML PO SYRP: 5.0000 mL | ORAL_SOLUTION | Freq: Four times a day (QID) | ORAL | 0 refills | Status: DC | PRN

## 2019-11-17 ENCOUNTER — Inpatient Hospital Stay: Payer: Medicare HMO

## 2019-11-17 ENCOUNTER — Other Ambulatory Visit: Payer: Self-pay

## 2019-11-17 ENCOUNTER — Ambulatory Visit
Admission: RE | Admit: 2019-11-17 | Discharge: 2019-11-17 | Disposition: A | Discharge status: Home / Self Care | Payer: Medicare HMO | Source: Ambulatory Visit | Attending: Radiation Oncology | Admitting: Radiation Oncology

## 2019-11-17 DIAGNOSIS — Z5111 Encounter for antineoplastic chemotherapy: Secondary | ICD-10-CM | POA: Diagnosis not present

## 2019-11-17 DIAGNOSIS — Z510 Encounter for antineoplastic radiation therapy: Secondary | ICD-10-CM | POA: Diagnosis not present

## 2019-11-17 DIAGNOSIS — C3411 Malignant neoplasm of upper lobe, right bronchus or lung: Secondary | ICD-10-CM | POA: Diagnosis not present

## 2019-11-17 LAB — CBC WITH DIFFERENTIAL (CANCER CENTER ONLY)
Abs Immature Granulocytes: 0.01 10*3/uL (ref 0.00–0.07)
Basophils Absolute: 0 10*3/uL (ref 0.0–0.1)
Basophils Relative: 1 %
Eosinophils Absolute: 0.3 10*3/uL (ref 0.0–0.5)
Eosinophils Relative: 13 %
HCT: 37.8 % — ABNORMAL LOW (ref 39.0–52.0)
Hemoglobin: 13 g/dL (ref 13.0–17.0)
Immature Granulocytes: 1 %
Lymphocytes Relative: 39 %
Lymphs Abs: 0.9 10*3/uL (ref 0.7–4.0)
MCH: 30 pg (ref 26.0–34.0)
MCHC: 34.4 g/dL (ref 30.0–36.0)
MCV: 87.3 fL (ref 80.0–100.0)
Monocytes Absolute: 0.3 10*3/uL (ref 0.1–1.0)
Monocytes Relative: 12 %
Neutro Abs: 0.7 10*3/uL — ABNORMAL LOW (ref 1.7–7.7)
Neutrophils Relative %: 34 %
Platelet Count: 55 10*3/uL — ABNORMAL LOW (ref 150–400)
RBC: 4.33 MIL/uL (ref 4.22–5.81)
RDW: 12.2 % (ref 11.5–15.5)
WBC Count: 2.1 10*3/uL — ABNORMAL LOW (ref 4.0–10.5)
nRBC: 0 % (ref 0.0–0.2)

## 2019-11-17 LAB — CMP (CANCER CENTER ONLY)
ALT: 57 U/L — ABNORMAL HIGH (ref 0–44)
AST: 38 U/L (ref 15–41)
Albumin: 3.4 g/dL — ABNORMAL LOW (ref 3.5–5.0)
Alkaline Phosphatase: 69 U/L (ref 38–126)
Anion gap: 9 (ref 5–15)
BUN: 29 mg/dL — ABNORMAL HIGH (ref 8–23)
CO2: 21 mmol/L — ABNORMAL LOW (ref 22–32)
Calcium: 10 mg/dL (ref 8.9–10.3)
Chloride: 107 mmol/L (ref 98–111)
Creatinine: 1.27 mg/dL — ABNORMAL HIGH (ref 0.61–1.24)
GFR, Est AFR Am: >60 mL/min (ref >60)
GFR, Estimated: 58 mL/min — ABNORMAL LOW (ref >60)
Glucose, Bld: 156 mg/dL — ABNORMAL HIGH (ref 70–99)
Potassium: 4.5 mmol/L (ref 3.5–5.1)
Sodium: 137 mmol/L (ref 135–145)
Total Bilirubin: 0.8 mg/dL (ref 0.3–1.2)
Total Protein: 6.7 g/dL (ref 6.5–8.1)

## 2019-11-18 ENCOUNTER — Ambulatory Visit
Admission: RE | Admit: 2019-11-18 | Discharge: 2019-11-18 | Disposition: A | Discharge status: Home / Self Care | Payer: Medicare HMO | Source: Ambulatory Visit | Attending: Radiation Oncology | Admitting: Radiation Oncology

## 2019-11-18 ENCOUNTER — Other Ambulatory Visit: Payer: Self-pay

## 2019-11-18 ENCOUNTER — Ambulatory Visit: Payer: BC Managed Care – PPO | Admitting: Diagnostic Neuroimaging

## 2019-11-18 DIAGNOSIS — C3411 Malignant neoplasm of upper lobe, right bronchus or lung: Secondary | ICD-10-CM | POA: Diagnosis not present

## 2019-11-18 DIAGNOSIS — Z510 Encounter for antineoplastic radiation therapy: Secondary | ICD-10-CM | POA: Diagnosis not present

## 2019-11-19 ENCOUNTER — Ambulatory Visit
Admission: RE | Admit: 2019-11-19 | Discharge: 2019-11-19 | Disposition: A | Discharge status: Home / Self Care | Payer: Medicare HMO | Source: Ambulatory Visit | Attending: Radiation Oncology | Admitting: Radiation Oncology

## 2019-11-19 ENCOUNTER — Other Ambulatory Visit: Payer: Self-pay

## 2019-11-19 DIAGNOSIS — Z510 Encounter for antineoplastic radiation therapy: Secondary | ICD-10-CM | POA: Diagnosis not present

## 2019-11-19 DIAGNOSIS — C3411 Malignant neoplasm of upper lobe, right bronchus or lung: Secondary | ICD-10-CM | POA: Diagnosis not present

## 2019-11-20 ENCOUNTER — Other Ambulatory Visit: Payer: Self-pay

## 2019-11-20 ENCOUNTER — Other Ambulatory Visit: Payer: Self-pay | Admitting: Radiation Oncology

## 2019-11-20 ENCOUNTER — Ambulatory Visit
Admission: RE | Admit: 2019-11-20 | Discharge: 2019-11-20 | Disposition: A | Discharge status: Home / Self Care | Payer: Medicare HMO | Source: Ambulatory Visit | Attending: Radiation Oncology | Admitting: Radiation Oncology

## 2019-11-20 DIAGNOSIS — Z510 Encounter for antineoplastic radiation therapy: Secondary | ICD-10-CM | POA: Diagnosis not present

## 2019-11-20 DIAGNOSIS — C3411 Malignant neoplasm of upper lobe, right bronchus or lung: Secondary | ICD-10-CM | POA: Diagnosis not present

## 2019-11-20 MED ORDER — HYDROCODONE-HOMATROPINE 5-1.5 MG/5ML PO SYRP: 5.0000 mL | ORAL_SOLUTION | Freq: Four times a day (QID) | ORAL | 0 refills | Status: DC | PRN

## 2019-11-23 ENCOUNTER — Ambulatory Visit
Admission: RE | Admit: 2019-11-23 | Discharge: 2019-11-23 | Disposition: A | Discharge status: Home / Self Care | Payer: Medicare HMO | Source: Ambulatory Visit | Attending: Radiation Oncology | Admitting: Radiation Oncology

## 2019-11-23 ENCOUNTER — Encounter: Payer: Self-pay | Admitting: Radiation Oncology

## 2019-11-23 ENCOUNTER — Ambulatory Visit: Payer: Medicare HMO

## 2019-11-23 ENCOUNTER — Other Ambulatory Visit: Payer: Self-pay

## 2019-11-23 DIAGNOSIS — C3411 Malignant neoplasm of upper lobe, right bronchus or lung: Secondary | ICD-10-CM | POA: Diagnosis not present

## 2019-11-23 DIAGNOSIS — Z510 Encounter for antineoplastic radiation therapy: Secondary | ICD-10-CM | POA: Diagnosis not present

## 2019-11-24 ENCOUNTER — Ambulatory Visit: Payer: Medicare HMO

## 2019-11-24 NOTE — Progress Notes (Signed)
[place] OFFICE PROGRESS NOTE  Copland, Gay Filler, MD [address] [place] [address]  DIAGNOSIS: stage IV(T3, N2, M1a) non-small cell lung cancer, adenocarcinoma presented with large right upper lobe lung mass extending to the right suprahilar region in addition to right hilar and mediastinal lymphadenopathy with obstruction of the associated upper lobe bronchusandbilateral subcentimeter pulmonary nodulesdiagnosed in July 2021.  Biomarker Findings Microsatellite status - MS-Stable Tumor Mutational Burden - 3 Muts/Mb Genomic Findings For a complete list of the genes assayed, please refer to the Appendix. ERBB2 [id number], amplification - equivocal? HGF amplification - equivocal? MTAP loss exons 5-8 MYC amplification - equivocal? CDKN2A/B CDKN2B loss, CDKN2A loss CUL3 Y3f*19 RPTOR amplification TP53 [id number] Disease relevant genes with no reportable alterations: ALK, BRAF, EGFR, KRAS, MET, RET, ROS1  PDL1 Expression: Negative  PRIOR THERAPY: Palliative radiotherapy to the obstructive lung mass under the care of Dr. MoLisbeth RenshawThe past treatment is scheduled for 11/23/19  CURRENT THERAPY: 1) Systemic chemotherapy with carboplatin for AUC of 5, Alimta 500 mg/M2 and Keytruda 200 mg IV every 3 weeks. First dose November 04, 2019. Status post 1 cycle.  INTERVAL HISTORY: WiRyman Rathgeber69.o. male returns to the clinic today for a follow up visit. The patient is feeling fairly well today without any concerning complaints.  At his last visit, he was endorsing taste changes, weight loss, and cough. He was found to have thrush and was given a prescription for nystatin. His weight is down about 7 lbs since his last appointment. He notes his taste is better but some foods are not appetizing and it is a matter of. He also was given a prescription for a medrol dosepak for his cough. He also completed a course of anti-biotics with Augmentin and has  hycodan for cough. His cough is much improved at this time. Denies fevers, chest pain, or hemoptysis. His nasal congestion has improved as well. He does have some baseline and persistent dyspnea on exertion.  He completed his last day of palliative radiotherapy on Monday. Otherwise, he denies chills or night sweats. Denies nausea, vomiting, diarrhea, or constipation. Denies rashes or skin changes. He is here for evaluation before starting cycle #2.   MEDICAL HISTORY: Past Medical History:  Diagnosis Date  . Chicken pox   . DM2 (diabetes mellitus, type 2) ([facility]  . History of kidney stones    passed several   . Hypertension   . Kidney stones    5-6 times  . Measles   . Mumps   . St[place]   x 2, last one was 09/18/2019- has some expressive aphasia    ALLERGIES:  is allergic to bee venom.  MEDICATIONS:  Current Outpatient Medications  Medication Sig Dispense Refill  . aspirin 81 MG tablet Take 81 mg by mouth daily.    . Marland Kitchentorvastatin (LIPITOR) 40 MG tablet Take 1 tablet (40 mg total) by mouth daily at 6 PM. 90 tablet 3  . clopidogrel (PLAVIX) 75 MG tablet Take 1 tablet (75 mg total) by mouth daily. Okay to restart this medication on 10/01/2019. 90 tablet 3  . folic acid (FOLVITE) 1 MG tablet Take 1 tablet (1 mg total) by mouth daily. 30 tablet 4  . gabapentin (NEURONTIN) 300 MG capsule Take 1 capsule (300 mg total) by mouth at bedtime. 90 capsule 3  . HYDROcodone-homatropine (HYCODAN) 5-1.5 MG/5ML syrup Take 5 mLs by mouth every 6 (six) hours as needed for cough. 120 mL  0  . lisinopril (ZESTRIL) 10 MG tablet TAKE ONE TABLET BY MOUTH ONE TIME DAILY 90 tablet 3  . metFORMIN (GLUCOPHAGE) 500 MG tablet TAKE TWO TABLETS BY MOUTH TWICE A DAY WITH MEAL(S) 360 tablet 3  . Omega-3 Fatty Acids (FISH OIL) 1200 MG CAPS Take by mouth daily.    Marland Kitchen acetaminophen (TYLENOL) 500 MG tablet Take 500 mg by mouth every 6 (six) hours as needed. (Patient not taking: Reported on 11/26/2019)    .  prochlorperazine (COMPAZINE) 10 MG tablet Take 10 mg by mouth every 6 (six) hours as needed. (Patient not taking: Reported on 11/26/2019)     No current facility-administered medications for this visit.   Facility-Administered Medications Ordered in Other Visits  Medication Dose Route Frequency Provider Last Rate Last Admin  . 0.9 %  sodium chloride infusion   Intravenous Once Curt Bears, MD      . CARBOplatin (PARAPLATIN) 510 mg in sodium chloride 0.9 % 250 mL chemo infusion  510 mg Intravenous Once Curt Bears, MD      . dexamethasone (DECADRON) 10 mg in sodium chloride 0.9 % 50 mL IVPB  10 mg Intravenous Once Curt Bears, MD      . fosaprepitant (EMEND) 150 mg in sodium chloride 0.9 % 145 mL IVPB  150 mg Intravenous Once Curt Bears, MD      . palonosetron (ALOXI) injection 0.25 mg  0.25 mg Intravenous Once Curt Bears, MD      . pembrolizumab [facility]) 200 mg in sodium chloride 0.9 % 50 mL chemo infusion  200 mg Intravenous Once Curt Bears, MD      . PEMEtrexed (ALIMTA) 1,100 mg in sodium chloride 0.9 % 100 mL chemo infusion  500 mg/m2 (Treatment Plan Recorded) Intravenous Once Curt Bears, MD        SURGICAL HISTORY:  Past Surgical History:  Procedure Laterality Date  . COLONOSCOPY W/ POLYPECTOMY    . VIDEO BRONCHOSCOPY WITH ENDOBRONCHIAL ULTRASOUND N/A 09/30/2019   Procedure: VIDEO BRONCHOSCOPY WITH ENDOBRONCHIAL ULTRASOUND;  Surgeon: Collene Gobble, MD;  Location: [facility];  Service: Thoracic;  Laterality: N/A;  . WISDOM TOOTH EXTRACTION  1976-77    REVIEW OF SYSTEMS:   Review of Systems  Constitutional: Positive for fatigue, appetite change, weight loss. Negative for chills,  fever. HENT:   Negative for mouth sores, nosebleeds, sore throat and trouble swallowing.  Eyes: Negative for eye problems and icterus.  Respiratory: Positive for dyspnea on exertion positive for mild and greatly improved cough.. Negative for hemoptysis  and wheezing.    Cardiovascular: Negative for chest pain and leg swelling.  Gastrointestinal: Negative for abdominal pain, constipation, diarrhea, nausea and vomiting.  Genitourinary: Negative for bladder incontinence, difficulty urinating, dysuria, frequency and hematuria.   Musculoskeletal: Negative for back pain, gait problem, neck pain and neck stiffness.  Skin: Negative for itching and rash.  Neurological: Negative for dizziness, extremity weakness, gait problem, headaches, light-headedness and seizures.  Hematological: Negative for adenopathy. Does not bruise/bleed easily.  Psychiatric/Behavioral: Negative for confusion, depression and sleep disturbance. The patient is not nervous/anxious.     PHYSICAL EXAMINATION:  Blood pressure 117/85, pulse (!) 111, temperature 97.9 F (36.6 C), temperature source Tympanic, resp. rate 18, height _0  (1.88 m), weight 193 lb (87.5 kg), SpO2 98 %.  ECOG PERFORMANCE STATUS: 1 - Symptomatic but completely ambulatory  Physical Exam  Constitutional: Oriented to person, place, and time and well-developed, well-nourished, and in no distress.  HENT:  Head: Normocephalic and atraumatic.  Mouth/Throat:  Oropharynx is clear and moist. No oropharyngeal exudate.  Eyes: Conjunctivae are normal. Right eye exhibits no discharge. Left eye exhibits no discharge. No scleral icterus.  Neck: Normal range of motion. Neck supple.  Cardiovascular: Normal rate, regular rhythm, normal heart sounds and intact distal pulses.   Pulmonary/Chest: Effort normal and breath sounds normal. No respiratory distress. No wheezes. No rales.  Abdominal: Soft. Bowel sounds are normal. Exhibits no distension and no mass. There is no tenderness.  Musculoskeletal: Normal range of motion. Exhibits no edema.  Lymphadenopathy:    No cervical adenopathy.  Neurological: Alert and oriented to person, place, and time. Exhibits normal muscle tone. Gait normal. Coordination normal.  Skin: Skin is warm and dry.  No rash noted. Not diaphoretic. No erythema. No pallor.  Psychiatric: Mood, memory and judgment normal.  Vitals reviewed.  LABORATORY DATA: Lab Results  Component Value Date   WBC 8.8 11/26/2019   HGB 12.1 (L) 11/26/2019   HCT 36.5 (L) 11/26/2019   MCV 87.7 11/26/2019   PLT 285 11/26/2019      Chemistry      Component Value Date/Time   NA 140 11/26/2019 1107   K 4.5 11/26/2019 1107   CL 108 11/26/2019 1107   CO2 22 11/26/2019 1107   BUN 23 11/26/2019 1107   CREATININE 1.25 (H) 11/26/2019 1107   CREATININE 1.13 10/16/2013 1118      Component Value Date/Time   CALCIUM 10.2 11/26/2019 1107   ALKPHOS 83 11/26/2019 1107   AST 28 11/26/2019 1107   ALT 29 11/26/2019 1107   BILITOT 0.5 11/26/2019 1107       RADIOGRAPHIC STUDIES:  No results found.   ASSESSMENT/PLAN:  This is a very pleasant 69 year old Caucasian male recently diagnosed with a stage IV (T3, N2, M1 a) non-small cell lung cancer, adenocarcinoma presented with large right upper lobe lung mass with extension to the right suprahilar region as well as right hilar and mediastinal lymphadenopathy with obstruction of the right upper lobe and bilateral pulmonary nodules diagnosed in July 2021. Negative PDL1 expression. He has no actionable mutations.   The patient completed palliative radiotherapy to the obstructive lung mass under the care of Dr. Lisbeth Renshaw. The last treatment was scheduled for 11/23/19  He is currently undergoing palliative systemic chemotherapy with carboplatin for an AUC of 5, Alimta 500 mg/m2, and Keytruda 200 mg IV every 3 weeks. He is status post 1 cycle and tolerated it well.   Labs were reviewed. Recommend that he proceed with cycle #2 today as scheduled.   We will see him back for a follow up visit in 3 weeks for evaluation before starting cycle #3.   He will continue taking Hycodan for the cough.  The patient also inquired about a Port-A-Cath. I had a lengthy discussion today with the patient  about the Port-A-Cath. He will think about this and let me know if he changes his mind and decides to move forward with this procedure  The patient inquired about the incidental thyroid nodules that was noted during a recent hospital admission. I discussed that it appears that one of the lesions met criteria for biopsy and I would be happy to put the order in for a FNA of one of the thyroid nodules.   The patient was advised to call immediately if he has any concerning symptoms in the interval. The patient voices understanding of current disease status and treatment options and is in agreement with the current care plan. All questions  were answered. The patient knows to call the clinic with any problems, questions or concerns. We can certainly see the patient much sooner if necessary      No orders of the defined types were placed in this encounter.    Keyerra Lamere L Allexus Ovens, PA-C 11/26/19

## 2019-11-25 ENCOUNTER — Ambulatory Visit: Payer: Medicare HMO

## 2019-11-26 ENCOUNTER — Other Ambulatory Visit: Payer: Self-pay | Admitting: Physician Assistant

## 2019-11-26 ENCOUNTER — Ambulatory Visit: Payer: Medicare HMO

## 2019-11-26 ENCOUNTER — Inpatient Hospital Stay: Payer: Medicare HMO | Admitting: Physician Assistant

## 2019-11-26 ENCOUNTER — Inpatient Hospital Stay: Payer: Medicare HMO

## 2019-11-26 ENCOUNTER — Other Ambulatory Visit: Payer: Self-pay

## 2019-11-26 VITALS — HR 96

## 2019-11-26 VITALS — BP 117/85 | HR 111 | Temp 97.9°F | Resp 18 | Ht 74.0 in | Wt 193.0 lb

## 2019-11-26 DIAGNOSIS — E041 Nontoxic single thyroid nodule: Secondary | ICD-10-CM | POA: Diagnosis not present

## 2019-11-26 DIAGNOSIS — Z5112 Encounter for antineoplastic immunotherapy: Secondary | ICD-10-CM | POA: Diagnosis not present

## 2019-11-26 DIAGNOSIS — Z5111 Encounter for antineoplastic chemotherapy: Secondary | ICD-10-CM

## 2019-11-26 DIAGNOSIS — C3411 Malignant neoplasm of upper lobe, right bronchus or lung: Secondary | ICD-10-CM

## 2019-11-26 DIAGNOSIS — R634 Abnormal weight loss: Secondary | ICD-10-CM

## 2019-11-26 LAB — CMP (CANCER CENTER ONLY)
ALT: 29 U/L (ref 0–44)
AST: 28 U/L (ref 15–41)
Albumin: 3.5 g/dL (ref 3.5–5.0)
Alkaline Phosphatase: 83 U/L (ref 38–126)
Anion gap: 10 (ref 5–15)
BUN: 23 mg/dL (ref 8–23)
CO2: 22 mmol/L (ref 22–32)
Calcium: 10.2 mg/dL (ref 8.9–10.3)
Chloride: 108 mmol/L (ref 98–111)
Creatinine: 1.25 mg/dL — ABNORMAL HIGH (ref 0.61–1.24)
GFR, Est AFR Am: >60 mL/min (ref >60)
GFR, Estimated: 59 mL/min — ABNORMAL LOW (ref >60)
Glucose, Bld: 152 mg/dL — ABNORMAL HIGH (ref 70–99)
Potassium: 4.5 mmol/L (ref 3.5–5.1)
Sodium: 140 mmol/L (ref 135–145)
Total Bilirubin: 0.5 mg/dL (ref 0.3–1.2)
Total Protein: 7.3 g/dL (ref 6.5–8.1)

## 2019-11-26 LAB — CBC WITH DIFFERENTIAL (CANCER CENTER ONLY)
Abs Immature Granulocytes: 0.54 K/uL — ABNORMAL HIGH (ref 0.00–0.07)
Basophils Absolute: 0.1 K/uL (ref 0.0–0.1)
Basophils Relative: 1 %
Eosinophils Absolute: 0.1 K/uL (ref 0.0–0.5)
Eosinophils Relative: 1 %
HCT: 36.5 % — ABNORMAL LOW (ref 39.0–52.0)
Hemoglobin: 12.1 g/dL — ABNORMAL LOW (ref 13.0–17.0)
Immature Granulocytes: 6 %
Lymphocytes Relative: 10 %
Lymphs Abs: 0.9 K/uL (ref 0.7–4.0)
MCH: 29.1 pg (ref 26.0–34.0)
MCHC: 33.2 g/dL (ref 30.0–36.0)
MCV: 87.7 fL (ref 80.0–100.0)
Monocytes Absolute: 0.8 K/uL (ref 0.1–1.0)
Monocytes Relative: 9 %
Neutro Abs: 6.4 K/uL (ref 1.7–7.7)
Neutrophils Relative %: 73 %
Platelet Count: 285 K/uL (ref 150–400)
RBC: 4.16 MIL/uL — ABNORMAL LOW (ref 4.22–5.81)
RDW: 13.3 % (ref 11.5–15.5)
WBC Count: 8.8 K/uL (ref 4.0–10.5)
nRBC: 0 % (ref 0.0–0.2)

## 2019-11-26 LAB — TSH: TSH: 0.766 u[IU]/mL (ref 0.320–4.118)

## 2019-11-26 MED ORDER — PALONOSETRON HCL INJECTION 0.25 MG/5ML
0.2500 mg | Freq: Once | INTRAVENOUS | Status: AC
  Administered 2019-11-26: 0.25 mg via INTRAVENOUS

## 2019-11-26 MED ORDER — SODIUM CHLORIDE 0.9 % IV SOLN
10.0000 mg | Freq: Once | INTRAVENOUS | Status: AC
  Administered 2019-11-26: 10 mg via INTRAVENOUS
  Filled 2019-11-26: qty 10

## 2019-11-26 MED ORDER — PALONOSETRON HCL INJECTION 0.25 MG/5ML
INTRAVENOUS | Status: AC
  Filled 2019-11-26: qty 5

## 2019-11-26 MED ORDER — SODIUM CHLORIDE 0.9 % IV SOLN
500.0000 mg/m2 | Freq: Once | INTRAVENOUS | Status: AC
  Administered 2019-11-26: 1100 mg via INTRAVENOUS
  Filled 2019-11-26: qty 40

## 2019-11-26 MED ORDER — SODIUM CHLORIDE 0.9 % IV SOLN
Freq: Once | INTRAVENOUS | Status: AC
  Filled 2019-11-26: qty 250

## 2019-11-26 MED ORDER — SODIUM CHLORIDE 0.9 % IV SOLN
505.5000 mg | Freq: Once | INTRAVENOUS | Status: AC
  Administered 2019-11-26: 510 mg via INTRAVENOUS
  Filled 2019-11-26: qty 51

## 2019-11-26 MED ORDER — SODIUM CHLORIDE 0.9 % IV SOLN
200.0000 mg | Freq: Once | INTRAVENOUS | Status: AC
  Administered 2019-11-26: 200 mg via INTRAVENOUS
  Filled 2019-11-26: qty 8

## 2019-11-26 MED ORDER — SODIUM CHLORIDE 0.9 % IV SOLN
150.0000 mg | Freq: Once | INTRAVENOUS | Status: AC
  Administered 2019-11-26: 150 mg via INTRAVENOUS
  Filled 2019-11-26: qty 150

## 2019-11-26 NOTE — Patient Instructions (Signed)
[facility] Discharge Instructions for Patients Receiving Chemotherapy  Today you received the following chemotherapy agents: Keytruda, Alimta, and Carboplatin  To help prevent nausea and vomiting after your treatment, we encourage you to take your nausea medication  as prescribed.    If you develop nausea and vomiting that is not controlled by your nausea medication, call the clinic.   BELOW ARE SYMPTOMS THAT SHOULD BE REPORTED IMMEDIATELY:  *FEVER GREATER THAN 100.5 F  *CHILLS WITH OR WITHOUT FEVER  NAUSEA AND VOMITING THAT IS NOT CONTROLLED WITH YOUR NAUSEA MEDICATION  *UNUSUAL SHORTNESS OF BREATH  *UNUSUAL BRUISING OR BLEEDING  TENDERNESS IN MOUTH AND THROAT WITH OR WITHOUT PRESENCE OF ULCERS  *URINARY PROBLEMS  *BOWEL PROBLEMS  UNUSUAL RASH Items with * indicate a potential emergency and should be followed up as soon as possible.  Feel free to call the clinic should you have any questions or concerns. The clinic phone number is [phone].  Please show the [facility] at check-in to the Emergency Department and triage nurse.

## 2019-11-27 ENCOUNTER — Ambulatory Visit: Payer: Medicare HMO

## 2019-11-27 ENCOUNTER — Telehealth: Payer: Self-pay | Admitting: Physician Assistant

## 2019-11-27 NOTE — Telephone Encounter (Signed)
Scheduled per los. Called and left msg. Mailed printout  °

## 2019-11-30 ENCOUNTER — Encounter: Payer: Self-pay | Admitting: *Deleted

## 2019-11-30 ENCOUNTER — Ambulatory Visit: Payer: Medicare HMO

## 2019-11-30 NOTE — Progress Notes (Signed)
Oncology Nurse Navigator Documentation  Oncology Nurse Navigator Flowsheets 11/30/2019  Abnormal Finding Date -  Confirmed Diagnosis Date -  Diagnosis Status Confirmed Diagnosis Complete  Planned Course of Treatment Chemo/Radiation Concurrent  Phase of Treatment Radiation  Chemotherapy Actual Start Date: 11/04/2019  Radiation Actual Start Date: 11/03/2019  Navigator Follow Up Date: 11/30/2019  Navigator Follow Up Reason: Appointment Review  Navigator Location [facility]  Navigator Encounter Type Other:  Multidisiplinary Clinic Date -  Multidisiplinary Clinic Type -  Treatment Initiated Date 11/03/2019  Patient Visit Type -  Treatment Phase Treatment  Barriers/Navigation Needs Coordination of Care  Education -  Interventions Coordination of Care  Acuity Level 2-Minimal Needs (1-2 Barriers Identified)  Coordination of Care Other  Education Method -  Time Spent with Patient 15

## 2019-12-01 ENCOUNTER — Ambulatory Visit
Admission: RE | Admit: 2019-12-01 | Discharge: 2019-12-01 | Disposition: A | Discharge status: Home / Self Care | Payer: Medicare HMO | Source: Ambulatory Visit | Attending: Physician Assistant | Admitting: Physician Assistant

## 2019-12-01 ENCOUNTER — Other Ambulatory Visit (HOSPITAL_COMMUNITY)
Admission: RE | Admit: 2019-12-01 | Discharge: 2019-12-01 | Disposition: A | Discharge status: Home / Self Care | Payer: Medicare HMO | Source: Ambulatory Visit | Attending: Radiology | Admitting: Radiology

## 2019-12-01 ENCOUNTER — Ambulatory Visit: Payer: Medicare HMO

## 2019-12-01 DIAGNOSIS — E041 Nontoxic single thyroid nodule: Secondary | ICD-10-CM | POA: Diagnosis not present

## 2019-12-02 ENCOUNTER — Ambulatory Visit: Payer: Medicare HMO

## 2019-12-02 ENCOUNTER — Other Ambulatory Visit: Payer: Self-pay

## 2019-12-02 ENCOUNTER — Inpatient Hospital Stay: Payer: Medicare HMO | Attending: Internal Medicine

## 2019-12-02 DIAGNOSIS — Z5111 Encounter for antineoplastic chemotherapy: Secondary | ICD-10-CM | POA: Diagnosis present

## 2019-12-02 DIAGNOSIS — Z79899 Other long term (current) drug therapy: Secondary | ICD-10-CM | POA: Insufficient documentation

## 2019-12-02 DIAGNOSIS — C3411 Malignant neoplasm of upper lobe, right bronchus or lung: Secondary | ICD-10-CM

## 2019-12-02 DIAGNOSIS — Z20822 Contact with and (suspected) exposure to covid-19: Secondary | ICD-10-CM | POA: Diagnosis not present

## 2019-12-02 LAB — CBC WITH DIFFERENTIAL (CANCER CENTER ONLY)
Abs Immature Granulocytes: 0.04 10*3/uL (ref 0.00–0.07)
Basophils Absolute: 0 10*3/uL (ref 0.0–0.1)
Basophils Relative: 1 %
Eosinophils Absolute: 0 10*3/uL (ref 0.0–0.5)
Eosinophils Relative: 0 %
HCT: 35.4 % — ABNORMAL LOW (ref 39.0–52.0)
Hemoglobin: 12 g/dL — ABNORMAL LOW (ref 13.0–17.0)
Immature Granulocytes: 1 %
Lymphocytes Relative: 10 %
Lymphs Abs: 0.6 10*3/uL — ABNORMAL LOW (ref 0.7–4.0)
MCH: 29.7 pg (ref 26.0–34.0)
MCHC: 33.9 g/dL (ref 30.0–36.0)
MCV: 87.6 fL (ref 80.0–100.0)
Monocytes Absolute: 0.1 10*3/uL (ref 0.1–1.0)
Monocytes Relative: 3 %
Neutro Abs: 4.8 10*3/uL (ref 1.7–7.7)
Neutrophils Relative %: 85 %
Platelet Count: 139 10*3/uL — ABNORMAL LOW (ref 150–400)
RBC: 4.04 MIL/uL — ABNORMAL LOW (ref 4.22–5.81)
RDW: 13.2 % (ref 11.5–15.5)
WBC Count: 5.6 10*3/uL (ref 4.0–10.5)
nRBC: 0 % (ref 0.0–0.2)

## 2019-12-02 LAB — CMP (CANCER CENTER ONLY)
ALT: 22 U/L (ref 0–44)
AST: 19 U/L (ref 15–41)
Albumin: 3.5 g/dL (ref 3.5–5.0)
Alkaline Phosphatase: 81 U/L (ref 38–126)
Anion gap: 9 (ref 5–15)
BUN: 42 mg/dL — ABNORMAL HIGH (ref 8–23)
CO2: 21 mmol/L — ABNORMAL LOW (ref 22–32)
Calcium: 10.2 mg/dL (ref 8.9–10.3)
Chloride: 107 mmol/L (ref 98–111)
Creatinine: 1.31 mg/dL — ABNORMAL HIGH (ref 0.61–1.24)
GFR, Est AFR Am: >60 mL/min (ref >60)
GFR, Estimated: 56 mL/min — ABNORMAL LOW (ref >60)
Glucose, Bld: 172 mg/dL — ABNORMAL HIGH (ref 70–99)
Potassium: 5.2 mmol/L — ABNORMAL HIGH (ref 3.5–5.1)
Sodium: 137 mmol/L (ref 135–145)
Total Bilirubin: 0.6 mg/dL (ref 0.3–1.2)
Total Protein: 7.2 g/dL (ref 6.5–8.1)

## 2019-12-02 LAB — CYTOLOGY - NON PAP

## 2019-12-03 ENCOUNTER — Ambulatory Visit: Payer: Medicare HMO

## 2019-12-04 ENCOUNTER — Ambulatory Visit: Payer: Medicare HMO

## 2019-12-04 ENCOUNTER — Telehealth: Payer: Self-pay | Admitting: Internal Medicine

## 2019-12-04 ENCOUNTER — Other Ambulatory Visit: Payer: Self-pay | Admitting: Physician Assistant

## 2019-12-04 ENCOUNTER — Telehealth: Payer: Self-pay | Admitting: Physician Assistant

## 2019-12-04 DIAGNOSIS — D497 Neoplasm of unspecified behavior of endocrine glands and other parts of nervous system: Secondary | ICD-10-CM

## 2019-12-04 DIAGNOSIS — E041 Nontoxic single thyroid nodule: Secondary | ICD-10-CM | POA: Diagnosis not present

## 2019-12-04 NOTE — Telephone Encounter (Signed)
Spoke to the patient about his thyroid biopsy. I discussed with him that I have placed a referral to Dr. Harlow Asa for management of the suspicious thyroid follicular neoplasm. He expressed understanding.

## 2019-12-04 NOTE — Telephone Encounter (Signed)
Release: [phone] **Outgoing Referral..Marland KitchenFaxed referral and pt medical records to [facility]

## 2019-12-08 ENCOUNTER — Encounter: Payer: Self-pay | Admitting: Cardiology

## 2019-12-08 ENCOUNTER — Ambulatory Visit: Payer: Medicare HMO

## 2019-12-08 ENCOUNTER — Ambulatory Visit: Payer: Medicare HMO | Admitting: Cardiology

## 2019-12-08 ENCOUNTER — Other Ambulatory Visit: Payer: Self-pay

## 2019-12-08 VITALS — BP 114/68 | HR 105 | Ht 74.0 in | Wt 194.2 lb

## 2019-12-08 DIAGNOSIS — I471 Supraventricular tachycardia: Secondary | ICD-10-CM

## 2019-12-08 DIAGNOSIS — I639 Cerebral infarction, unspecified: Secondary | ICD-10-CM

## 2019-12-08 NOTE — Patient Instructions (Addendum)
Medication Instructions:  Your physician recommends that you continue on your current medications as directed. Please refer to the Current Medication list given to you today.  *If you need a refill on your cardiac medications before your next appointment, please call your pharmacy*   Lab Work: None ordered If you have labs (blood work) drawn today and your tests are completely normal, you will receive your results only by: Marland Kitchen MyChart Message (if you have MyChart) OR . A paper copy in the mail If you have any lab test that is abnormal or we need to change your treatment, we will call you to review the results.   Testing/Procedures: None ordered   Follow-Up: At [facility], you and your health needs are our priority.  As part of our continuing mission to provide you with exceptional heart care, we have created designated Provider Care Teams.  These Care Teams include your primary Cardiologist (physician) and Advanced Practice Providers (APPs -  Physician Assistants and Nurse Practitioners) who all work together to provide you with the care you need, when you need it.  We recommend signing up for the patient portal called "MyChart".  Sign up information is provided on this After Visit Summary.  MyChart is used to connect with patients for Virtual Visits (Telemedicine).  Patients are able to view lab/test results, encounter notes, upcoming appointments, etc.  Non-urgent messages can be sent to your provider as well.   To learn more about what you can do with MyChart, go to [email or web address].    Your next appointment:    as needed  (we will only see you if something is found on your loop recorder)  The format for your next appointment:   In Person  Provider:   Allegra Lai, MD  Your physician recommends that you schedule a follow-up appointment in: 7 - 10 days for wound check.   Thank you for choosing [facility]!!   Trinidad Curet, RN [phone]    Other  Instructions   Implantable Loop Recorder Placement  An implantable loop recorder is a small electronic device that is placed under the skin of your chest. It is about the size of an AA ("double A") battery. The device records the electrical activity of your heart over a long period of time. Your health care provider can download these recordings to monitor your heart. You may need an implantable loop recorder if you have periods of abnormal heart activity (arrhythmias) or unexplained fainting (syncope). The recorder can be left in place for 1 year or longer. Tell a health care provider about:  Any allergies you have.  All medicines you are taking, including vitamins, herbs, eye drops, creams, and over-the-counter medicines.  Any problems you or family members have had with anesthetic medicines.  Any blood disorders you have.  Any surgeries you have had.  Any medical conditions you have.  Whether you are pregnant or may be pregnant. What are the risks? Generally, this is a safe procedure. However, problems may occur, including:  Infection.  Bleeding.  Allergic reactions to anesthetic medicines.  Damage to nerves or blood vessels.  Failure of the device to work. This could require another surgery to replace it. What happens before the procedure?   You may have a physical exam, blood tests, and imaging tests of your heart, such as a chest X-ray.  Follow instructions from your health care provider about eating or drinking restrictions.  Ask your health care provider about: ? Changing or stopping your  regular medicines. This is especially important if you are taking diabetes medicines or blood thinners. ? Taking medicines such as aspirin and ibuprofen. These medicines can thin your blood. Do not take these medicines unless your health care provider tells you to take them. ? Taking over-the-counter medicines, vitamins, herbs, and supplements.  Ask your health care provider how  your surgical site will be marked or identified.  Ask your health care provider what steps will be taken to help prevent infection. These may include: ? Removing hair at the surgery site. ? Washing skin with a germ-killing soap.  Plan to have someone take you home from the hospital or clinic.  Plan to have a responsible adult care for you for at least 24 hours after you leave the hospital or clinic. This is important.  Do not use any products that contain nicotine or tobacco, such as cigarettes and e-cigarettes. If you need help quitting, ask your health care provider. What happens during the procedure?  An IV will be inserted into one of your veins.  You may be given one or more of the following: ? A medicine to help you relax (sedative). ? A medicine to numb the area (local anesthetic).  A small incision will be made on the left side of your upper chest.  A pocket will be created under your skin.  The device will be placed in the pocket.  The incision will be closed with stitches (sutures) or adhesive strips.  A bandage (dressing) will be placed over the incision. The procedure may vary among health care providers and hospitals. What happens after the procedure?  Your blood pressure, heart rate, breathing rate, and blood oxygen level will be monitored until you leave the hospital or clinic.  You may be able to go home on the day of your surgery. Before you go home: ? Your health care provider will program your recorder. ? You will learn how to trigger your device with a handheld activator. ? You will learn how to send recordings to your health care provider. ? You will get an ID card for your device, and you will be told when to use it.  Do not drive for 24 hours if you were given a sedative during your procedure. Summary  An implantable loop recorder is a small electronic device that is placed under the skin of your chest to monitor your heart over a long period of  time.  The recorder can be left in place for 1 year or longer.  Plan to have someone take you home from the hospital or clinic. This information is not intended to replace advice given to you by your health care provider. Make sure you discuss any questions you have with your health care provider. Document Revised: 05/23/2017 Document Reviewed: 05/04/2017 Elsevier Patient Education  2020 [employer].

## 2019-12-08 NOTE — Progress Notes (Signed)
Electrophysiology Office Note   Date:  12/08/2019   ID:  Micheal Oliver, DOB 1950/11/30, MRN [id number]  PCP:  Darreld Mclean, MD  Cardiologist:   Primary Electrophysiologist:  Karim Aiello Meredith Leeds, MD    Chief Complaint: CVA   History of Present Illness: Micheal Oliver is a 69 y.o. male who is being seen today for the evaluation of CVA at the request of Copland, Gay Filler, MD. Presenting today for electrophysiology evaluation.  He has a history of type 2 diabetes, hypertension, hyperlipidemia.  He developed expressive aphasia June 2021.  MRI of the brain showed a forced centimeter acute posterior left MCA infarct with bilateral occipital and cerebellar infarcts.  He has a right stage IV adenocarcinoma.  He is currently undergoing radiation therapy.  Today, denies symptoms of palpitations, chest pain, shortness of breath, orthopnea, PND, lower extremity edema, claudication, dizziness, presyncope, syncope, bleeding, or neurologic sequela. The patient is tolerating medications without difficulties.  He has undergone radiation therapy to his right chest.  He has also planned for thyroid and adenoidectomy due to a second cancer.   Past Medical History:  Diagnosis Date  . Chicken pox   . DM2 (diabetes mellitus, type 2) ([place])   . History of kidney stones    passed several   . Hypertension   . Kidney stones    5-6 times  . Measles   . Mumps   . Stroke ([place])    x 2, last one was 09/18/2019- has some expressive aphasia   Past Surgical History:  Procedure Laterality Date  . COLONOSCOPY W/ POLYPECTOMY    . VIDEO BRONCHOSCOPY WITH ENDOBRONCHIAL ULTRASOUND N/A 09/30/2019   Procedure: VIDEO BRONCHOSCOPY WITH ENDOBRONCHIAL ULTRASOUND;  Surgeon: Collene Gobble, MD;  Location: [facility];  Service: Thoracic;  Laterality: N/A;  . WISDOM TOOTH EXTRACTION  1976-77     Current Outpatient Medications  Medication Sig Dispense Refill  . acetaminophen (TYLENOL) 500 MG tablet Take 500 mg by mouth  every 6 (six) hours as needed. (Patient not taking: Reported on 11/26/2019)    . aspirin 81 MG tablet Take 81 mg by mouth daily.    Marland Kitchen atorvastatin (LIPITOR) 40 MG tablet Take 1 tablet (40 mg total) by mouth daily at 6 PM. 90 tablet 3  . clopidogrel (PLAVIX) 75 MG tablet Take 1 tablet (75 mg total) by mouth daily. Okay to restart this medication on 10/01/2019. 90 tablet 3  . folic acid (FOLVITE) 1 MG tablet Take 1 tablet (1 mg total) by mouth daily. 30 tablet 4  . gabapentin (NEURONTIN) 300 MG capsule Take 1 capsule (300 mg total) by mouth at bedtime. 90 capsule 3  . HYDROcodone-homatropine (HYCODAN) 5-1.5 MG/5ML syrup Take 5 mLs by mouth every 6 (six) hours as needed for cough. 120 mL 0  . lisinopril (ZESTRIL) 10 MG tablet TAKE ONE TABLET BY MOUTH ONE TIME DAILY 90 tablet 3  . metFORMIN (GLUCOPHAGE) 500 MG tablet TAKE TWO TABLETS BY MOUTH TWICE A DAY WITH MEAL(S) 360 tablet 3  . Omega-3 Fatty Acids (FISH OIL) 1200 MG CAPS Take by mouth daily.    . prochlorperazine (COMPAZINE) 10 MG tablet Take 10 mg by mouth every 6 (six) hours as needed. (Patient not taking: Reported on 11/26/2019)     No current facility-administered medications for this visit.    Allergies:   Bee venom   Social History:  The patient  reports that he has never smoked. He has never used smokeless tobacco. He reports that he  does not drink alcohol and does not use drugs.   Family History:  The patient's family history includes Alcohol abuse (age of onset: 24) in his father; Anxiety disorder in his daughter; Arthritis in his maternal grandmother; Dementia in his paternal grandmother; Healthy in his son; Heart attack in his paternal aunt; Heart disease in his father; Hypertension in his father and paternal grandmother; Lung disease in his father; Other in his maternal aunt; Stomach cancer (age of onset: 19) in his mother; Stroke in his paternal grandmother; Sudden death in his maternal grandfather and paternal grandfather.    ROS:   Please see the history of present illness.   Otherwise, review of systems is positive for none.   All other systems are reviewed and negative.   PHYSICAL EXAM: VS:  BP 114/68   Pulse (!) 105   Ht 6\' 2"  (1.88 m)   Wt 194 lb 3.2 oz (88.1 kg)   SpO2 96%   BMI 24.93 kg/m  , BMI Body mass index is 24.93 kg/m. GEN: Well nourished, well developed, in no acute distress  HEENT: normal  Neck: no JVD, carotid bruits, or masses Cardiac: RRR; no murmurs, rubs, or gallops,no edema  Respiratory:  clear to auscultation bilaterally, normal work of breathing GI: soft, nontender, nondistended, + BS MS: no deformity or atrophy  Skin: warm and dry Neuro:  Strength and sensation are intact Psych: euthymic mood, full affect  EKG:  EKG is ordered today. Personal review of the ekg ordered shows sinus rhythm, rate 102  Recent Labs: 11/26/2019: TSH 0.766 12/02/2019: ALT 22; BUN 42; Creatinine 1.31; Hemoglobin 12.0; Platelet Count 139; Potassium 5.2; Sodium 137    Lipid Panel     Component Value Date/Time   CHOL 106 09/19/2019 0431   TRIG 184 (H) 09/19/2019 0431   HDL 27 (L) 09/19/2019 0431   CHOLHDL 3.9 09/19/2019 0431   VLDL 37 09/19/2019 0431   LDLCALC 42 09/19/2019 0431   LDLDIRECT 66.0 01/10/2016 1110     Wt Readings from Last 3 Encounters:  12/08/19 194 lb 3.2 oz (88.1 kg)  11/26/19 193 lb (87.5 kg)  11/10/19 199 lb 9.6 oz (90.5 kg)      Other studies Reviewed: Additional studies/ records that were reviewed today include: TTE 09/19/19  Review of the above records today demonstrates:  1. Left ventricular ejection fraction, by estimation, is 60 to 65%. The  left ventricle has normal function. The left ventricle has no regional  wall motion abnormalities. There is moderate concentric left ventricular  hypertrophy. Left ventricular  diastolic parameters are consistent with Grade I diastolic dysfunction  (impaired relaxation).  2. Right ventricular systolic function is normal. The right  ventricular  size is normal.  3. Left atrial size was mildly dilated.  4. The mitral valve is normal in structure. Mild mitral valve  regurgitation. No evidence of mitral stenosis.  5. The aortic valve is normal in structure. Aortic valve regurgitation is  trivial. No aortic stenosis is present.  6. The inferior vena cava is normal in size with greater than 50%  respiratory variability, suggesting right atrial pressure of 3 mmHg.    ASSESSMENT AND PLAN:  1.  Cryptogenic stroke: No obvious source has been found.  We Addisen Chappelle thus plan for Linq monitor implant.  Risks and benefits were discussed include bleeding, and infection.  The patient understands these risks and has agreed to the procedure.  2.  Hypertension: Currently well controlled  3.  Hyperlipidemia: Continue atorvastatin  Current  medicines are reviewed at length with the patient today.   The patient does not have concerns regarding his medicines.  The following changes were made today: None  Labs/ tests ordered today include:  Orders Placed This Encounter  Procedures  . EKG 12-Lead     Disposition:   FU with Endia Moncur pending link monitor results  Signed, Lameka Disla Meredith Leeds, MD  12/08/2019 5:02 PM     [place] [address] [phone] (office) [phone] (fax)  SURGEON:  Allegra Lai, MD     PREPROCEDURE DIAGNOSIS:  Cryptogenic Stroke    POSTPROCEDURE DIAGNOSIS:  Cryptogenic Stroke     PROCEDURES:   1. Implantable loop recorder implantation    INTRODUCTION:  Hoover Grewe is a 69 y.o. male with a history of unexplained stroke who presents today for implantable loop implantation.  The patient has had a cryptogenic stroke.  Despite an extensive workup by neurology, no reversible causes have been identified.  he has worn telemetry during which he did not have arrhythmias.  There is significant concern for possible atrial fibrillation as the cause for  the patients stroke.  The patient therefore presents today for implantable loop implantation.     DESCRIPTION OF PROCEDURE:  Informed written consent was obtained, and the patient was brought to the electrophysiology lab in a fasting state.  The patient required no sedation for the procedure today.  Mapping over the patient's chest was performed by the EP lab staff to identify the area where electrograms were most prominent for ILR recording.  This area was found to be the left parasternal region over the 3rd-4th intercostal space. The patients left chest was therefore prepped and draped in the usual sterile fashion by the EP lab staff. The skin overlying the left parasternal region was infiltrated with lidocaine for local analgesia.  A 0.5-cm incision was made over the left parasternal region over the 3rd intercostal space.  A subcutaneous ILR pocket was fashioned using a combination of sharp and blunt dissection.  A Medtronic Reveal Linq model [place] [id number] implantable loop recorder was then placed into the pocket  R waves were very prominent and measured 0.45mV. EBL<1 ml.  Steri- Strips and a sterile dressing were then applied.  There were no early apparent complications.     CONCLUSIONS:   1. Successful implantation of a Medtronic Reveal LINQ implantable loop recorder for cryptogenic stroke  2. No early apparent complications.

## 2019-12-09 ENCOUNTER — Other Ambulatory Visit: Payer: Self-pay

## 2019-12-09 ENCOUNTER — Inpatient Hospital Stay: Payer: Medicare HMO

## 2019-12-09 ENCOUNTER — Ambulatory Visit: Payer: Medicare HMO

## 2019-12-09 DIAGNOSIS — Z5111 Encounter for antineoplastic chemotherapy: Secondary | ICD-10-CM | POA: Diagnosis not present

## 2019-12-09 DIAGNOSIS — Z23 Encounter for immunization: Secondary | ICD-10-CM

## 2019-12-09 DIAGNOSIS — C3411 Malignant neoplasm of upper lobe, right bronchus or lung: Secondary | ICD-10-CM

## 2019-12-09 LAB — CMP (CANCER CENTER ONLY)
ALT: 35 U/L (ref 0–44)
AST: 19 U/L (ref 15–41)
Albumin: 3.4 g/dL — ABNORMAL LOW (ref 3.5–5.0)
Alkaline Phosphatase: 82 U/L (ref 38–126)
Anion gap: 7 (ref 5–15)
BUN: 20 mg/dL (ref 8–23)
CO2: 19 mmol/L — ABNORMAL LOW (ref 22–32)
Calcium: 9.4 mg/dL (ref 8.9–10.3)
Chloride: 112 mmol/L — ABNORMAL HIGH (ref 98–111)
Creatinine: 1.22 mg/dL (ref 0.61–1.24)
GFR, Est AFR Am: >60 mL/min (ref >60)
GFR, Estimated: >60 mL/min (ref >60)
Glucose, Bld: 225 mg/dL — ABNORMAL HIGH (ref 70–99)
Potassium: 4.5 mmol/L (ref 3.5–5.1)
Sodium: 138 mmol/L (ref 135–145)
Total Bilirubin: 0.4 mg/dL (ref 0.3–1.2)
Total Protein: 6.7 g/dL (ref 6.5–8.1)

## 2019-12-09 LAB — CBC WITH DIFFERENTIAL (CANCER CENTER ONLY)
Abs Immature Granulocytes: 0.07 10*3/uL (ref 0.00–0.07)
Basophils Absolute: 0 10*3/uL (ref 0.0–0.1)
Basophils Relative: 1 %
Eosinophils Absolute: 0.1 10*3/uL (ref 0.0–0.5)
Eosinophils Relative: 2 %
HCT: 30.6 % — ABNORMAL LOW (ref 39.0–52.0)
Hemoglobin: 10.4 g/dL — ABNORMAL LOW (ref 13.0–17.0)
Immature Granulocytes: 3 %
Lymphocytes Relative: 27 %
Lymphs Abs: 0.6 10*3/uL — ABNORMAL LOW (ref 0.7–4.0)
MCH: 30.1 pg (ref 26.0–34.0)
MCHC: 34 g/dL (ref 30.0–36.0)
MCV: 88.7 fL (ref 80.0–100.0)
Monocytes Absolute: 0.5 10*3/uL (ref 0.1–1.0)
Monocytes Relative: 22 %
Neutro Abs: 0.9 10*3/uL — ABNORMAL LOW (ref 1.7–7.7)
Neutrophils Relative %: 45 %
Platelet Count: 78 10*3/uL — ABNORMAL LOW (ref 150–400)
RBC: 3.45 MIL/uL — ABNORMAL LOW (ref 4.22–5.81)
RDW: 13.3 % (ref 11.5–15.5)
WBC Count: 2.1 10*3/uL — ABNORMAL LOW (ref 4.0–10.5)
nRBC: 0 % (ref 0.0–0.2)

## 2019-12-09 NOTE — Progress Notes (Signed)
   Covid-19 Vaccination Clinic  Name:  Micheal Oliver    MRN: [id number] DOB: 1950/09/28  12/09/2019  [name] was observed post Covid-19 immunization for 15 minutes without incident. He was provided with Vaccine Information Sheet and instruction to access the V-Safe system.   [name] was instructed to call 911 with any severe reactions post vaccine: Marland Kitchen Difficulty breathing  . Swelling of face and throat  . A fast heartbeat  . A bad rash all over body  . Dizziness and weakness

## 2019-12-10 ENCOUNTER — Ambulatory Visit: Payer: Medicare HMO

## 2019-12-11 ENCOUNTER — Ambulatory Visit: Payer: Medicare HMO

## 2019-12-11 NOTE — Progress Notes (Signed)
  Radiation Oncology         [phone] ________________________________  Name: Micheal Oliver MRN: [id number]  Date: 11/23/2019  DOB: 01-03-1951  End of Treatment Note  Diagnosis:   lung cancer     Indication for treatment::  palliative       Radiation treatment dates:   11/03/19 - 11/23/19  Site/dose:    The right lung was treated to a dose of 37.5 Gy in 10 fractions using a 3-field 3D conformal technique.  Narrative: The patient tolerated radiation treatment relatively well.     Plan: The patient has completed radiation treatment. The patient will return to radiation oncology clinic for routine followup in one month. I advised the patient to call or return sooner if they have any questions or concerns related to their recovery or treatment. ________________________________  Jodelle Gross, M.D., Ph.D.

## 2019-12-14 ENCOUNTER — Ambulatory Visit: Payer: Medicare HMO

## 2019-12-14 ENCOUNTER — Ambulatory Visit: Payer: Medicare HMO | Admitting: Diagnostic Neuroimaging

## 2019-12-14 ENCOUNTER — Encounter: Payer: Self-pay | Admitting: Diagnostic Neuroimaging

## 2019-12-14 ENCOUNTER — Other Ambulatory Visit: Payer: Self-pay

## 2019-12-14 VITALS — BP 121/71 | HR 104 | Ht 74.0 in | Wt 191.4 lb

## 2019-12-14 DIAGNOSIS — I639 Cerebral infarction, unspecified: Secondary | ICD-10-CM

## 2019-12-14 NOTE — Progress Notes (Signed)
[facility]  PATIENT: Micheal Oliver DOB: 1951/03/06  REFERRING CLINICIAN: Copland, Gay Filler, MD HISTORY FROM: patient  REASON FOR VISIT: new consult    HISTORICAL  CHIEF COMPLAINT:  Chief Complaint  Patient presents with  . New Patient (Initial Visit)    RM 6, alone. Internal referral for CVA from PCP Lamar Blinks, MD).     HISTORY OF PRESENT ILLNESS:   69 year old male here for evaluation of stroke.  History of hypertension, diabetes, hyperlipidemia, now diagnosed with stage IV adenocarcinoma of the lung.  Patient was in normal health until June 2021 when he woke up with word finding difficulties.  Patient went to the hospital for evaluation was diagnosed with multiple embolic strokes in the left parietal and bilateral occipital and cerebellar regions.  Stroke work-up was completed.  He has seen cardiology and now has implanted loop recorder.  He is also been diagnosed with lung cancer following that work-up and now is undergoing cancer treatments.  Since discharge patient is doing well.  No problems with balance, coordination, numbness or weakness.  He has very rare word finding difficulties but overall is improved.  He does not feel any vision problems.  He has been driving.  Right sided extinction was noted on visual field testing in the hospital in June 2021.   REVIEW OF SYSTEMS: Full 14 system review of systems performed and negative with exception of: As per HPI.  ALLERGIES: Allergies  Allergen Reactions  . Bee Venom     Lip swelling    HOME MEDICATIONS: Outpatient Medications Prior to Visit  Medication Sig Dispense Refill  . acetaminophen (TYLENOL) 500 MG tablet Take 500 mg by mouth every 6 (six) hours as needed.     Marland Kitchen aspirin 81 MG tablet Take 81 mg by mouth daily.    Marland Kitchen atorvastatin (LIPITOR) 40 MG tablet Take 1 tablet (40 mg total) by mouth daily at 6 PM. 90 tablet 3  . clopidogrel (PLAVIX) 75 MG tablet Take 1 tablet (75 mg total) by  mouth daily. Okay to restart this medication on 10/01/2019. 90 tablet 3  . folic acid (FOLVITE) 1 MG tablet Take 1 tablet (1 mg total) by mouth daily. 30 tablet 4  . gabapentin (NEURONTIN) 300 MG capsule Take 1 capsule (300 mg total) by mouth at bedtime. 90 capsule 3  . HYDROcodone-homatropine (HYCODAN) 5-1.5 MG/5ML syrup Take 5 mLs by mouth every 6 (six) hours as needed for cough. 120 mL 0  . lisinopril (ZESTRIL) 10 MG tablet TAKE ONE TABLET BY MOUTH ONE TIME DAILY 90 tablet 3  . metFORMIN (GLUCOPHAGE) 500 MG tablet TAKE TWO TABLETS BY MOUTH TWICE A DAY WITH MEAL(S) 360 tablet 3  . Omega-3 Fatty Acids (FISH OIL) 1200 MG CAPS Take by mouth daily.    . prochlorperazine (COMPAZINE) 10 MG tablet Take 10 mg by mouth every 6 (six) hours as needed.  (Patient not taking: Reported on 12/14/2019)     No facility-administered medications prior to visit.    PAST MEDICAL HISTORY: Past Medical History:  Diagnosis Date  . Chicken pox   . DM2 (diabetes mellitus, type 2) ([place])   . History of kidney stones    passed several   . Hypertension   . Kidney stones    5-6 times  . Measles   . Mumps   . Stroke ([place])    x 2, last one was 09/18/2019- has some expressive aphasia    PAST SURGICAL HISTORY: Past Surgical History:  Procedure Laterality  Date  . COLONOSCOPY W/ POLYPECTOMY    . VIDEO BRONCHOSCOPY WITH ENDOBRONCHIAL ULTRASOUND N/A 09/30/2019   Procedure: VIDEO BRONCHOSCOPY WITH ENDOBRONCHIAL ULTRASOUND;  Surgeon: Collene Gobble, MD;  Location: [facility];  Service: Thoracic;  Laterality: N/A;  . WISDOM TOOTH EXTRACTION  1976-77    FAMILY HISTORY: Family History  Problem Relation Age of Onset  . Alcohol abuse Father 28       Deceased  . Heart disease Father   . Lung disease Father   . Hypertension Father   . Stomach cancer Mother 81       Deceased  . Hypertension Paternal Grandmother   . Dementia Paternal Grandmother   . Stroke Paternal Grandmother   . Arthritis Maternal Grandmother   . Sudden  death Paternal Grandfather   . Sudden death Maternal Grandfather   . Heart attack Paternal Aunt   . Other Maternal Aunt        Natural Causes  . Healthy Son   . Anxiety disorder Daughter     SOCIAL HISTORY: Social History   Socioeconomic History  . Marital status: Married    Spouse name: [name]  . Number of children: 2  . Years of education: MBA  . Highest education level: Not on file  Occupational History    Comment: retired, Banker   Tobacco Use  . Smoking status: Never Smoker  . Smokeless tobacco: Never Used  Vaping Use  . Vaping Use: Never used  Substance and Sexual Activity  . Alcohol use: No  . Drug use: No  . Sexual activity: Yes  Other Topics Concern  . Not on file  Social History Narrative   Lives at home with wife   Caffeine use- 15-20oz per day   Right handed    Social Determinants of Health   Financial Resource Strain:   . Difficulty of Paying Living Expenses: Not on file  Food Insecurity:   . Worried About Charity fundraiser in the Last Year: Not on file  . Ran Out of Food in the Last Year: Not on file  Transportation Needs:   . Lack of Transportation (Medical): Not on file  . Lack of Transportation (Non-Medical): Not on file  Physical Activity:   . Days of Exercise per Week: Not on file  . Minutes of Exercise per Session: Not on file  Stress:   . Feeling of Stress : Not on file  Social Connections:   . Frequency of Communication with Friends and Family: Not on file  . Frequency of Social Gatherings with Friends and Family: Not on file  . Attends Religious Services: Not on file  . Active Member of Clubs or Organizations: Not on file  . Attends Archivist Meetings: Not on file  . Marital Status: Not on file  Intimate Partner Violence:   . Fear of Current or Ex-Partner: Not on file  . Emotionally Abused: Not on file  . Physically Abused: Not on file  . Sexually Abused: Not on file     PHYSICAL EXAM  GENERAL  EXAM/CONSTITUTIONAL: Vitals:  Vitals:   12/14/19 1307  BP: 121/71  Pulse: (!) 104  Weight: 191 lb 6.4 oz (86.8 kg)  Height: 6\' 2"  (1.88 m)     Body mass index is 24.57 kg/m. Wt Readings from Last 3 Encounters:  12/14/19 191 lb 6.4 oz (86.8 kg)  12/08/19 194 lb 3.2 oz (88.1 kg)  11/26/19 193 lb (87.5 kg)     Patient is in no  distress; well developed, nourished and groomed; neck is supple  CARDIOVASCULAR:  Examination of carotid arteries is normal; no carotid bruits  Regular rate and rhythm, no murmurs  Examination of peripheral vascular system by observation and palpation is normal  EYES:  Ophthalmoscopic exam of optic discs and posterior segments is normal; no papilledema or hemorrhages  No exam data present  MUSCULOSKELETAL:  Gait, strength, tone, movements noted in Neurologic exam below  NEUROLOGIC: MENTAL STATUS:  No flowsheet data found.  awake, alert, oriented to person, place and time  recent and remote memory intact  normal attention and concentration  language fluent, comprehension intact, naming intact  fund of knowledge appropriate  CRANIAL NERVE:   2nd - no papilledema on fundoscopic exam  2nd, 3rd, 4th, 6th - pupils equal and reactive to light, visual fields full to confrontation, MILD RIGHT FIELD EXTINCTION TO DOUBLE SIMULTANEOUS STIMULATION VISUALLY; extraocular muscles intact, no nystagmus; MILD RIGHT PTOSIS  5th - facial sensation symmetric  7th - facial strength symmetric; MILD RIGHT FACIAL SPASM  8th - hearing intact  9th - palate elevates symmetrically, uvula midline  11th - shoulder shrug symmetric  12th - tongue protrusion midline  MOTOR:   normal bulk and tone, full strength in the BUE, BLE  SENSORY:   normal and symmetric to light touch, temperature, vibration  COORDINATION:   finger-nose-finger, fine finger movements normal  REFLEXES:   deep tendon reflexes present and symmetric  GAIT/STATION:   narrow  based gait     DIAGNOSTIC DATA (LABS, IMAGING, TESTING) - I reviewed patient records, labs, notes, testing and imaging myself where available.  Lab Results  Component Value Date   WBC 2.1 (L) 12/09/2019   HGB 10.4 (L) 12/09/2019   HCT 30.6 (L) 12/09/2019   MCV 88.7 12/09/2019   PLT 78 (L) 12/09/2019      Component Value Date/Time   NA 138 12/09/2019 0851   K 4.5 12/09/2019 0851   CL 112 (H) 12/09/2019 0851   CO2 19 (L) 12/09/2019 0851   GLUCOSE 225 (H) 12/09/2019 0851   BUN 20 12/09/2019 0851   CREATININE 1.22 12/09/2019 0851   CREATININE 1.13 10/16/2013 1118   CALCIUM 9.4 12/09/2019 0851   PROT 6.7 12/09/2019 0851   ALBUMIN 3.4 (L) 12/09/2019 0851   AST 19 12/09/2019 0851   ALT 35 12/09/2019 0851   ALKPHOS 82 12/09/2019 0851   BILITOT 0.4 12/09/2019 0851   GFRNONAA >60 12/09/2019 0851   GFRAA >60 12/09/2019 0851   Lab Results  Component Value Date   CHOL 106 09/19/2019   HDL 27 (L) 09/19/2019   LDLCALC 42 09/19/2019   LDLDIRECT 66.0 01/10/2016   TRIG 184 (H) 09/19/2019   CHOLHDL 3.9 09/19/2019   Lab Results  Component Value Date   HGBA1C 7.3 (H) 09/19/2019   No results found for: [id number] Lab Results  Component Value Date   TSH 0.766 11/26/2019    09/18/19 MRI brain [I reviewed images myself and agree with interpretation. -[facility]]  1. 4 cm acute posterior left MCA territory infarct. 2. Small subacute bilateral occipital and cerebellar infarcts. 3. Mild chronic small vessel ischemic disease.  09/19/19 CTA head / neck  1. No significant proximal stenosis, aneurysm, or branch vessel occlusion within the Circle of Willis. 2. Luxury perfusion in the area of the left parietal infarct. 3. Right hilar and suprahilar mass lesion measuring at least 5.3 x 4.0 x 4.8 cm. This is concerning for a primary lung neoplasm. Recommend CT of the  chest with contrast for further evaluation. 4. Additional hilar adenopathy is concerning for metastatic disease. 5. 1.9 cm hypo  dense nodule in the left lobe of the thyroid. Recommend thyroid US (ref: J Am Coll Radiol. 2015 Feb;12(2): 143-50).  09/19/19 TTE No intracardiac source of embolism  detected on this transthoracic study. A transesophageal echocardiogram is  recommended to exclude cardiac source of embolism if clinically indicated.    ASSESSMENT AND PLAN  69 y.o. year old male here with hypertension, hyperlipidemia, diabetes, stage IV lung cancer, multiple embolic strokes in June 7125.  No source of strokes has been found so far, but pattern suggest cardioembolic source.  Hypercoagulable state associated with malignancy also possible cause.   Dx:  1. Cerebrovascular accident (CVA), unspecified mechanism ([place])      PLAN:  STROKE (cryptogenic) - continue aspirin 81 + clopidogrel 75 until end of Sept 2021; then continue clopidogrel 75mg  daily alone - continue atorvastatin, metformin, BP control - recommend to avoid driving until vision (right-sided extinction to double simultaneous stimulation) returns to baseline  Return for pending if symptoms worsen or fail to improve, return to PCP.    Penni Bombard, MD 2/71/2929, 0:90 PM Certified in Neurology, Neurophysiology and Neuroimaging  [facility] [address] [place], [place] [address] [phone]

## 2019-12-14 NOTE — Patient Instructions (Signed)
STROKE (cryptogenic) - continue aspirin 81 + clopidogrel 75 until end of Sept 2021; then continue clopidogrel 75mg  daily alone  - continue atorvastatin, metformin, BP control  - recommend to avoid driving until vision returns to baseline

## 2019-12-15 ENCOUNTER — Ambulatory Visit: Payer: Medicare HMO

## 2019-12-16 ENCOUNTER — Ambulatory Visit: Payer: Medicare HMO

## 2019-12-16 ENCOUNTER — Encounter: Payer: Self-pay | Admitting: *Deleted

## 2019-12-16 ENCOUNTER — Inpatient Hospital Stay (HOSPITAL_BASED_OUTPATIENT_CLINIC_OR_DEPARTMENT_OTHER): Payer: Medicare HMO | Admitting: Internal Medicine

## 2019-12-16 ENCOUNTER — Other Ambulatory Visit: Payer: Self-pay

## 2019-12-16 ENCOUNTER — Encounter: Payer: Self-pay | Admitting: Internal Medicine

## 2019-12-16 ENCOUNTER — Inpatient Hospital Stay: Payer: Medicare HMO | Admitting: Nutrition

## 2019-12-16 ENCOUNTER — Inpatient Hospital Stay: Payer: Medicare HMO

## 2019-12-16 VITALS — BP 112/74 | HR 105 | Temp 97.5°F | Resp 17 | Ht 74.0 in | Wt 193.6 lb

## 2019-12-16 VITALS — HR 98

## 2019-12-16 DIAGNOSIS — Z5112 Encounter for antineoplastic immunotherapy: Secondary | ICD-10-CM | POA: Diagnosis not present

## 2019-12-16 DIAGNOSIS — C3490 Malignant neoplasm of unspecified part of unspecified bronchus or lung: Secondary | ICD-10-CM | POA: Diagnosis not present

## 2019-12-16 DIAGNOSIS — C3411 Malignant neoplasm of upper lobe, right bronchus or lung: Secondary | ICD-10-CM

## 2019-12-16 DIAGNOSIS — I10 Essential (primary) hypertension: Secondary | ICD-10-CM | POA: Diagnosis not present

## 2019-12-16 DIAGNOSIS — Z5111 Encounter for antineoplastic chemotherapy: Secondary | ICD-10-CM

## 2019-12-16 LAB — CBC WITH DIFFERENTIAL (CANCER CENTER ONLY)
Abs Immature Granulocytes: 0.22 10*3/uL — ABNORMAL HIGH (ref 0.00–0.07)
Basophils Absolute: 0.1 10*3/uL (ref 0.0–0.1)
Basophils Relative: 2 %
Eosinophils Absolute: 0.1 10*3/uL (ref 0.0–0.5)
Eosinophils Relative: 3 %
HCT: 30.3 % — ABNORMAL LOW (ref 39.0–52.0)
Hemoglobin: 10.2 g/dL — ABNORMAL LOW (ref 13.0–17.0)
Immature Granulocytes: 6 %
Lymphocytes Relative: 19 %
Lymphs Abs: 0.8 10*3/uL (ref 0.7–4.0)
MCH: 30 pg (ref 26.0–34.0)
MCHC: 33.7 g/dL (ref 30.0–36.0)
MCV: 89.1 fL (ref 80.0–100.0)
Monocytes Absolute: 0.6 10*3/uL (ref 0.1–1.0)
Monocytes Relative: 15 %
Neutro Abs: 2.1 10*3/uL (ref 1.7–7.7)
Neutrophils Relative %: 55 %
Platelet Count: 227 10*3/uL (ref 150–400)
RBC: 3.4 MIL/uL — ABNORMAL LOW (ref 4.22–5.81)
RDW: 15.6 % — ABNORMAL HIGH (ref 11.5–15.5)
WBC Count: 3.9 10*3/uL — ABNORMAL LOW (ref 4.0–10.5)
nRBC: 0 % (ref 0.0–0.2)

## 2019-12-16 LAB — CMP (CANCER CENTER ONLY)
ALT: 28 U/L (ref 0–44)
AST: 19 U/L (ref 15–41)
Albumin: 3.5 g/dL (ref 3.5–5.0)
Alkaline Phosphatase: 88 U/L (ref 38–126)
Anion gap: 10 (ref 5–15)
BUN: 22 mg/dL (ref 8–23)
CO2: 21 mmol/L — ABNORMAL LOW (ref 22–32)
Calcium: 9.2 mg/dL (ref 8.9–10.3)
Chloride: 111 mmol/L (ref 98–111)
Creatinine: 1.22 mg/dL (ref 0.61–1.24)
GFR, Est AFR Am: >60 mL/min (ref >60)
GFR, Estimated: >60 mL/min (ref >60)
Glucose, Bld: 202 mg/dL — ABNORMAL HIGH (ref 70–99)
Potassium: 4.3 mmol/L (ref 3.5–5.1)
Sodium: 142 mmol/L (ref 135–145)
Total Bilirubin: 0.5 mg/dL (ref 0.3–1.2)
Total Protein: 7 g/dL (ref 6.5–8.1)

## 2019-12-16 LAB — TSH: TSH: 1.065 u[IU]/mL (ref 0.320–4.118)

## 2019-12-16 MED ORDER — PALONOSETRON HCL INJECTION 0.25 MG/5ML
0.2500 mg | Freq: Once | INTRAVENOUS | Status: AC
  Administered 2019-12-16: 0.25 mg via INTRAVENOUS

## 2019-12-16 MED ORDER — PALONOSETRON HCL INJECTION 0.25 MG/5ML
INTRAVENOUS | Status: AC
  Filled 2019-12-16: qty 5

## 2019-12-16 MED ORDER — CYANOCOBALAMIN 1000 MCG/ML IJ SOLN
1000.0000 ug | Freq: Once | INTRAMUSCULAR | Status: AC
  Administered 2019-12-16: 1000 ug via INTRAMUSCULAR

## 2019-12-16 MED ORDER — SODIUM CHLORIDE 0.9 % IV SOLN
500.0000 mg/m2 | Freq: Once | INTRAVENOUS | Status: AC
  Administered 2019-12-16: 1100 mg via INTRAVENOUS
  Filled 2019-12-16: qty 4

## 2019-12-16 MED ORDER — HYDROCODONE-HOMATROPINE 5-1.5 MG/5ML PO SYRP
5.0000 mL | ORAL_SOLUTION | Freq: Four times a day (QID) | ORAL | 0 refills | Status: DC | PRN
Start: 2019-12-16 — End: 2020-01-06

## 2019-12-16 MED ORDER — SODIUM CHLORIDE 0.9 % IV SOLN
10.0000 mg | Freq: Once | INTRAVENOUS | Status: AC
  Administered 2019-12-16: 10 mg via INTRAVENOUS
  Filled 2019-12-16: qty 10

## 2019-12-16 MED ORDER — SODIUM CHLORIDE 0.9 % IV SOLN
515.0000 mg | Freq: Once | INTRAVENOUS | Status: AC
  Administered 2019-12-16: 520 mg via INTRAVENOUS
  Filled 2019-12-16: qty 52

## 2019-12-16 MED ORDER — SODIUM CHLORIDE 0.9 % IV SOLN
150.0000 mg | Freq: Once | INTRAVENOUS | Status: AC
  Administered 2019-12-16: 150 mg via INTRAVENOUS
  Filled 2019-12-16: qty 150

## 2019-12-16 MED ORDER — SODIUM CHLORIDE 0.9 % IV SOLN
200.0000 mg | Freq: Once | INTRAVENOUS | Status: AC
  Administered 2019-12-16: 200 mg via INTRAVENOUS
  Filled 2019-12-16: qty 8

## 2019-12-16 MED ORDER — SODIUM CHLORIDE 0.9 % IV SOLN
Freq: Once | INTRAVENOUS | Status: AC
  Filled 2019-12-16: qty 250

## 2019-12-16 MED ORDER — CYANOCOBALAMIN 1000 MCG/ML IJ SOLN
INTRAMUSCULAR | Status: AC
  Filled 2019-12-16: qty 1

## 2019-12-16 NOTE — Progress Notes (Signed)
[facility] [phone]   [phone]  OFFICE PROGRESS NOTE  [place], Gay Filler, MD [address] [place] [address]  DIAGNOSIS: stage IV (T3, N2, M1a) non-small cell lung cancer, adenocarcinoma presented with large right upper lobe lung mass extending to the right suprahilar region in addition to right hilar and mediastinal lymphadenopathy with obstruction of the associated upper lobe bronchus and bilateral subcentimeter pulmonary nodules diagnosed in July 2021.  Biomarker Findings Microsatellite status - MS-Stable Tumor Mutational Burden - 3 Muts/Mb Genomic Findings For a complete list of the genes assayed, please refer to the Appendix. ERBB2 [id number], amplification - equivocal? HGF amplification - equivocal? MTAP loss exons 5-8 MYC amplification - equivocal? CDKN2A/B CDKN2B loss, CDKN2A loss CUL3 Y79f*19 RPTOR amplification TP53 [id number] 7 Disease relevant genes with no reportable alterations: ALK, BRAF, EGFR, KRAS, MET, RET, ROS1  PDL1 Expression: Negative  PRIOR THERAPY: Palliative radiotherapy to the obstructive lung mass under the care of Dr. MoLisbeth RenshawThe past treatment is scheduled for8/23/21.  CURRENT THERAPY: Systemic chemotherapy with carboplatin for AUC of 5, Alimta 500 mg/M2 and Keytruda 200 mg IV every 3 weeks.  First dose November 04, 2019.  Status post 2 cycles.  INTERVAL HISTORY: WiMalcome Ambrocio89.o. male returns to the clinic today for follow-up visit.  The patient is feeling fine today with no concerning complaints except for the fatigue and shortness of breath with exertion.  He also continues have mild cough.  He has no chest pain or hemoptysis.  He denied having any current nausea, vomiting, diarrhea but has occasional constipation.  He had intermittent nausea in the past but he does not take his medication as prescribed.  The patient had fine-needle aspiration of the thyroid nodule and it was  consistent with malignant cells.  He is scheduled to see Dr. GeHarlow Asaor surgical evaluation next months.  Is here today for evaluation before starting cycle #3 of his treatment with chemotherapy.  MEDICAL HISTORY: Past Medical History:  Diagnosis Date  . Chicken pox   . DM2 (diabetes mellitus, type 2) ([facility]  . History of kidney stones    passed several   . Hypertension   . Kidney stones    5-6 times  . Measles   . Mumps   . Stroke ([facility]   x 2, last one was 09/18/2019- has some expressive aphasia    ALLERGIES:  is allergic to bee venom.  MEDICATIONS:  Current Outpatient Medications  Medication Sig Dispense Refill  . acetaminophen (TYLENOL) 500 MG tablet Take 500 mg by mouth every 6 (six) hours as needed.     . [place]land Kitchenspirin 81 MG tablet Take 81 mg by mouth daily.    . Marland Kitchentorvastatin (LIPITOR) 40 MG tablet Take 1 tablet (40 mg total) by mouth daily at 6 PM. 90 tablet 3  . clopidogrel (PLAVIX) 75 MG tablet Take 1 tablet (75 mg total) by mouth daily. Okay to restart this medication on 10/01/2019. 90 tablet 3  . folic acid (FOLVITE) 1 MG tablet Take 1 tablet (1 mg total) by mouth daily. 30 tablet 4  . gabapentin (NEURONTIN) 300 MG capsule Take 1 capsule (300 mg total) by mouth at bedtime. 90 capsule 3  . HYDROcodone-homatropine (HYCODAN) 5-1.5 MG/5ML syrup Take 5 mLs by mouth every 6 (six) hours as needed for cough. 120 mL 0  . lisinopril (ZESTRIL) 10 MG tablet TAKE ONE TABLET BY MOUTH ONE TIME DAILY 90 tablet 3  .  metFORMIN (GLUCOPHAGE) 500 MG tablet TAKE TWO TABLETS BY MOUTH TWICE A DAY WITH MEAL(S) 360 tablet 3  . Omega-3 Fatty Acids (FISH OIL) 1200 MG CAPS Take by mouth daily.    . prochlorperazine (COMPAZINE) 10 MG tablet Take 10 mg by mouth every 6 (six) hours as needed.  (Patient not taking: Reported on 12/14/2019)     No current facility-administered medications for this visit.    SURGICAL HISTORY:  Past Surgical History:  Procedure Laterality Date  . COLONOSCOPY W/ POLYPECTOMY      . VIDEO BRONCHOSCOPY WITH ENDOBRONCHIAL ULTRASOUND N/A 09/30/2019   Procedure: VIDEO BRONCHOSCOPY WITH ENDOBRONCHIAL ULTRASOUND;  Surgeon: Collene Gobble, MD;  Location: [facility];  Service: Thoracic;  Laterality: N/A;  . WISDOM TOOTH EXTRACTION  1976-77    REVIEW OF SYSTEMS:  A comprehensive review of systems was negative except for: Constitutional: positive for fatigue Respiratory: positive for cough and dyspnea on exertion Gastrointestinal: positive for constipation and nausea   PHYSICAL EXAMINATION: General appearance: alert, cooperative, fatigued and no distress Head: Normocephalic, without obvious abnormality, atraumatic Neck: no adenopathy, no JVD, supple, symmetrical, trachea midline and thyroid not enlarged, symmetric, no tenderness/mass/nodules Lymph nodes: Cervical, supraclavicular, and axillary nodes normal. Resp: clear to auscultation bilaterally Back: symmetric, no curvature. ROM normal. No CVA tenderness. Cardio: regular rate and rhythm, S1, S2 normal, no murmur, click, rub or gallop GI: soft, non-tender; bowel sounds normal; no masses,  no organomegaly Extremities: extremities normal, atraumatic, no cyanosis or edema  ECOG PERFORMANCE STATUS: 1 - Symptomatic but completely ambulatory  Blood pressure 112/74, pulse (!) 105, temperature (!) 97.5 F (36.4 C), temperature source Tympanic, resp. rate 17, height _0  (1.88 m), weight 193 lb 9.6 oz (87.8 kg), SpO2 100 %.  LABORATORY DATA: Lab Results  Component Value Date   WBC 3.9 (L) 12/16/2019   HGB 10.2 (L) 12/16/2019   HCT 30.3 (L) 12/16/2019   MCV 89.1 12/16/2019   PLT 227 12/16/2019      Chemistry      Component Value Date/Time   NA 138 12/09/2019 0851   K 4.5 12/09/2019 0851   CL 112 (H) 12/09/2019 0851   CO2 19 (L) 12/09/2019 0851   BUN 20 12/09/2019 0851   CREATININE 1.22 12/09/2019 0851   CREATININE 1.13 10/16/2013 1118      Component Value Date/Time   CALCIUM 9.4 12/09/2019 0851   ALKPHOS 82  12/09/2019 0851   AST 19 12/09/2019 0851   ALT 35 12/09/2019 0851   BILITOT 0.4 12/09/2019 0851       RADIOGRAPHIC STUDIES: [place] FNA BX THYROID 1ST LESION AFIRMA  Result Date: 12/01/2019 INDICATION: Patient history of thyroid ultrasound dated September 20, 2019 which revealed a 2.4 cm left superior thyroid nodule. Patient presents for ultrasound-guided thyroid biopsy EXAM: ULTRASOUND GUIDED FINE NEEDLE ASPIRATION OF INDETERMINATE THYROID NODULE COMPARISON:  Thyroid ultrasound dated September 20, 2019 MEDICATIONS: Lidocaine 1% skin to subcutaneous tissue COMPLICATIONS: None immediate. TECHNIQUE: Informed written consent was obtained from the patient after a discussion of the risks, benefits and alternatives to treatment. Questions regarding the procedure were encouraged and answered. A timeout was performed prior to the initiation of the procedure. Pre-procedural ultrasound scanning demonstrated unchanged size and appearance of the indeterminate nodule within the left superior lobe The procedure was planned. The neck was prepped in the usual sterile fashion, and a sterile drape was applied covering the operative field. A timeout was performed prior to the initiation of the procedure. Local anesthesia was provided with  1% lidocaine. Under direct ultrasound guidance, 5 FNA biopsies were performed of the left superior nodule with a 25 gauge needle. Two samples were sent to [facility] per ordering MD. Multiple ultrasound images were saved for procedural documentation purposes. The samples were prepared and submitted to pathology. Limited post procedural scanning was negative for hematoma or additional complication. Dressings were placed. The patient tolerated the above procedures procedure well without immediate postprocedural complication. FINDINGS: Nodule reference number based on prior diagnostic ultrasound: 2 Maximum size: 2.4 cm Location: Left; Superior ACR TI-RADS risk category: 4 Reason for biopsy: meets ACR TI-RADS  criteria Ultrasound imaging confirms appropriate placement of the needles within the thyroid nodule. IMPRESSION: Technically successful ultrasound guided fine needle aspiration of left superior nodule Read by: Rushie Nyhan, NP Electronically Signed   By: Aletta Edouard M.D.   On: 12/01/2019 10:38    ASSESSMENT AND PLAN: This is a very pleasant 69 years old white male recently diagnosed with a stage IV (T3, N2, M1 a) non-small cell lung cancer, adenocarcinoma presented with large right upper lobe lung mass with extension to the right suprahilar region as well as right hilar and mediastinal lymphadenopathy with obstruction of the right upper lobe and bilateral pulmonary nodules diagnosed in July 2021. The molecular study shows no actionable mutations and PD-L1 expression is negative. He had a short course of palliative radiotherapy to the obstructive lung mass under the care of Dr. Lisbeth Renshaw. The patient is currently undergoing systemic chemotherapy with carboplatin for AUC of 5, Alimta 500 mg/M2 and Keytruda 200 mg IV every 3 weeks status post 2 cycles.  He has been tolerating this treatment well except for fatigue and occasional nausea and constipation. I recommended for the patient to proceed with cycle #3 today as planned. He will come back for follow-up visit in 3 weeks for evaluation with repeat CT scan of the chest, abdomen pelvis for restaging of his disease. For the thyroid malignancy, he was referred to Dr. Harlow Asa for evaluation and surgical intervention. He was advised to call immediately if he has any concerning symptoms in the interval. The patient voices understanding of current disease status and treatment options and is in agreement with the current care plan.  All questions were answered. The patient knows to call the clinic with any problems, questions or concerns. We can certainly see the patient much sooner if necessary.  Disclaimer: This note was dictated with voice recognition  software. Similar sounding words can inadvertently be transcribed and may not be corrected upon review.

## 2019-12-16 NOTE — Progress Notes (Signed)
69 year old male diagnosed with lung cancer status post radiation therapy, receiving chemotherapy.  Past medical history includes chickenpox, diabetes, hypertension, measles, mumps, and stroke.  Medications include Glucophage and Compazine.  Labs include glucose 202.  Height: 6 feet 2 inches. Weight: 193.6 pounds on September 15. Usual body weight: 215 pounds in July. BMI: 24.86.  Patient reports he lost most of his weight during radiation therapy.  Reports he could not swallow and everything made him sick. He still has some occasional nausea but it is often without warning.  He does have nausea medicine that he can take. Patient positive for 10% weight loss in less than 3 months which is significant however weight has been stable the last 2 weeks. Patient reports improved appetite.  Nutrition diagnosis: Unintended weight loss related to lung cancer and associated treatments as evidenced by 10% weight loss from usual body weight which is significant.  Intervention: Patient was educated to consume smaller more frequent meals and snacks. Encouraged him to chew food well and take small bites. Take nausea medication as needed. Reviewed high-calorie high-protein foods. Provided coupons for oral nutrition supplements. Questions were answered.  Teach back method used.  Contact information given.  Monitoring, evaluation, goals: Patient will tolerate adequate calories and protein for weight maintenance.  Next visit: Wednesday, October 6 during infusion.  **Disclaimer: This note was dictated with voice recognition software. Similar sounding words can inadvertently be transcribed and this note may contain transcription errors which may not have been corrected upon publication of note.**

## 2019-12-16 NOTE — Patient Instructions (Signed)
[facility] Discharge Instructions for Patients Receiving Chemotherapy  Today you received the following chemotherapy agents: pembrolizumab/pemetrexed/carboplatin.  To help prevent nausea and vomiting after your treatment, we encourage you to take your nausea medication as directed.   If you develop nausea and vomiting that is not controlled by your nausea medication, call the clinic.   BELOW ARE SYMPTOMS THAT SHOULD BE REPORTED IMMEDIATELY:  *FEVER GREATER THAN 100.5 F  *CHILLS WITH OR WITHOUT FEVER  NAUSEA AND VOMITING THAT IS NOT CONTROLLED WITH YOUR NAUSEA MEDICATION  *UNUSUAL SHORTNESS OF BREATH  *UNUSUAL BRUISING OR BLEEDING  TENDERNESS IN MOUTH AND THROAT WITH OR WITHOUT PRESENCE OF ULCERS  *URINARY PROBLEMS  *BOWEL PROBLEMS  UNUSUAL RASH Items with * indicate a potential emergency and should be followed up as soon as possible.  Feel free to call the clinic should you have any questions or concerns. The clinic phone number is [phone].  Please show the [facility] at check-in to the Emergency Department and triage nurse.

## 2019-12-17 ENCOUNTER — Encounter (HOSPITAL_COMMUNITY): Payer: Self-pay

## 2019-12-17 ENCOUNTER — Telehealth: Payer: Self-pay | Admitting: Physician Assistant

## 2019-12-17 ENCOUNTER — Ambulatory Visit: Payer: Medicare HMO

## 2019-12-17 NOTE — Telephone Encounter (Signed)
Called the patient that his Afirma testing came back for his thyroid nodule. Discussed that this came back with a low likelihood of being malignant. However, I would advise him to keep his appointment with Dr. Harlow Asa as planned. We will fax the results to Dr. Tera Helper office.

## 2019-12-18 ENCOUNTER — Ambulatory Visit: Payer: Medicare HMO

## 2019-12-22 ENCOUNTER — Telehealth: Payer: Self-pay | Admitting: Radiation Oncology

## 2019-12-22 NOTE — Patient Instructions (Addendum)
It was very nice to see you again today-please let me know what I can do to help Let us plan to visit in about 3 months I will ask Dr Jerilynn Mages about your flu shot Ok to stop lisinopril as your BP is low I will be in touch with your A1c   Take care, hang in there. I am so sorry this is happening to you

## 2019-12-22 NOTE — Progress Notes (Addendum)
[facility] at [facility] [address] [place], [place] [address] [phone]  Date:  12/24/2019   Name:  Micheal Oliver   DOB:  Dec 18, 1950   MRN:  [id number]  PCP:  Darreld Mclean, MD    Chief Complaint: Hyperlipidemia (3 month follow up)   History of Present Illness:  Micheal Oliver is a 69 y.o. very pleasant male patient who presents with the following:  Patient here today for follow-up visit- History of hypertension, diabetes, hyperlipidemia, family history of early CAD, ED Last seen by myself in June of this year.  Time we were following up from the hospital admission for stroke, with incidental discovery of lung cancer He is now being treated by Dr. Julien Nordmann for right upper lobe lung cancer-unfortunately his lung cancer is stage IV.  Patient has never been a smoker ASSESSMENT AND PLAN: This is a very pleasant 69 years old white male recently diagnosed with a stage IV (T3, N2, M1 a) non-small cell lung cancer, adenocarcinoma presented with large right upper lobe lung mass with extension to the right suprahilar region as well as right hilar and mediastinal lymphadenopathy with obstruction of the right upper lobe and bilateral pulmonary nodules diagnosed in July 2021. The molecular study shows no actionable mutations and PD-L1 expression is negative. He had a short course of palliative radiotherapy to the obstructive lung mass under the care of Dr. Lisbeth Renshaw. The patient is currently undergoing systemic chemotherapy with carboplatin for AUC of 5, Alimta 500 mg/M2 and Keytruda 200 mg IV every 3 weeks status post 2 cycles.  He has been tolerating this treatment well except for fatigue and occasional nausea and constipation. I recommended for the patient to proceed with cycle #3 today as planned. He will come back for follow-up visit in 3 weeks for evaluation with repeat CT scan of the chest, abdomen pelvis for restaging of his disease. For  the thyroid malignancy, he was referred to Dr. Harlow Asa for evaluation and surgical intervention.  Due for foot exam-  Can mention eye exam Lab Results  Component Value Date   HGBA1C 7.3 (H) 09/19/2019   Update A1c today patient would like COVID-19 up-to-date including booster  He took [place], he is now on other chemotherapy He feels bad after he uses chemo- he does a treatment every 3 weeks Pt has never smoked- his parents did smoke and never had lung cancer Pt did work in the Scient[employer], product/process development in the past  His wife [name] is "being a Passenger transport manager," they are moving into a smaller home in a retirement community  He does not have a lot of energy  He gets SOB with overexertion   He has lost a good bit of weight His baseline weight was 220- 225 prior to his stroke However he has not been able to eat in a few days due to chemo  He is using some hycodan as needed for cough at night   Wt Readings from Last 3 Encounters:  12/24/19 189 lb (85.7 kg)  12/16/19 193 lb 9.6 oz (87.8 kg)  12/14/19 191 lb 6.4 oz (86.8 kg)   BP Readings from Last 3 Encounters:  12/24/19 98/60  12/16/19 112/74  12/14/19 121/71   Pulse Readings from Last 3 Encounters:  12/24/19 (!) 105  12/16/19 98  12/16/19 (!) 105     Patient Active Problem List   Diagnosis Date Noted  . Thyroid nodule 11/26/2019  . Oral thrush 11/10/2019  .  Cough 11/10/2019  . Encounter for antineoplastic chemotherapy 10/28/2019  . Encounter for antineoplastic immunotherapy 10/28/2019  . Goals of care, counseling/discussion 10/15/2019  . Malignant neoplasm of right upper lobe of lung ([place]) 10/11/2019  . Mediastinal lymphadenopathy 09/30/2019  . Mass of right lung 09/24/2019  . Stroke ([place]) 09/18/2019  . Hypertension associated with diabetes ([place]) 09/18/2019  . Hyperlipidemia associated with type 2 diabetes mellitus ([place]) 09/18/2019  . Eyelid twitch 09/28/2018  . Controlled type 2 diabetes mellitus without complication, without  long-term current use of insulin ([place]) 07/12/2016  . Hyperlipidemia 01/10/2016  . Family history of early CAD 07/11/2015  . Essential hypertension, benign 01/29/2014  . Erectile dysfunction 10/16/2013    Past Medical History:  Diagnosis Date  . Chicken pox   . DM2 (diabetes mellitus, type 2) ([place])   . History of kidney stones    passed several   . Hypertension   . Kidney stones    5-6 times  . Measles   . Mumps   . Stroke ([place])    x 2, last one was 09/18/2019- has some expressive aphasia    Past Surgical History:  Procedure Laterality Date  . COLONOSCOPY W/ POLYPECTOMY    . VIDEO BRONCHOSCOPY WITH ENDOBRONCHIAL ULTRASOUND N/A 09/30/2019   Procedure: VIDEO BRONCHOSCOPY WITH ENDOBRONCHIAL ULTRASOUND;  Surgeon: Collene Gobble, MD;  Location: [facility];  Service: Thoracic;  Laterality: N/A;  . WISDOM TOOTH EXTRACTION  1976-77    Social History   Tobacco Use  . Smoking status: Never Smoker  . Smokeless tobacco: Never Used  Vaping Use  . Vaping Use: Never used  Substance Use Topics  . Alcohol use: No  . Drug use: No    Family History  Problem Relation Age of Onset  . Alcohol abuse Father 24       Deceased  . Heart disease Father   . Lung disease Father   . Hypertension Father   . Stomach cancer Mother 27       Deceased  . Hypertension Paternal Grandmother   . Dementia Paternal Grandmother   . Stroke Paternal Grandmother   . Arthritis Maternal Grandmother   . Sudden death Paternal Grandfather   . Sudden death Maternal Grandfather   . Heart attack Paternal Aunt   . Other Maternal Aunt        Natural Causes  . Healthy Son   . Anxiety disorder Daughter     Allergies  Allergen Reactions  . Bee Venom     Lip swelling    Medication list has been reviewed and updated.  Current Outpatient Medications on File Prior to Visit  Medication Sig Dispense Refill  . acetaminophen (TYLENOL) 500 MG tablet Take 500 mg by mouth every 6 (six) hours as needed.     Marland Kitchen aspirin 81  MG tablet Take 81 mg by mouth daily.    Marland Kitchen atorvastatin (LIPITOR) 40 MG tablet Take 1 tablet (40 mg total) by mouth daily at 6 PM. 90 tablet 3  . clopidogrel (PLAVIX) 75 MG tablet Take 1 tablet (75 mg total) by mouth daily. Okay to restart this medication on 10/01/2019. 90 tablet 3  . folic acid (FOLVITE) 1 MG tablet Take 1 tablet (1 mg total) by mouth daily. 30 tablet 4  . gabapentin (NEURONTIN) 300 MG capsule Take 1 capsule (300 mg total) by mouth at bedtime. 90 capsule 3  . HYDROcodone-homatropine (HYCODAN) 5-1.5 MG/5ML syrup Take 5 mLs by mouth every 6 (six) hours as needed for cough.  120 mL 0  . lisinopril (ZESTRIL) 10 MG tablet TAKE ONE TABLET BY MOUTH ONE TIME DAILY 90 tablet 3  . metFORMIN (GLUCOPHAGE) 500 MG tablet TAKE TWO TABLETS BY MOUTH TWICE A DAY WITH MEAL(S) 360 tablet 3  . Omega-3 Fatty Acids (FISH OIL) 1200 MG CAPS Take by mouth daily.    . prochlorperazine (COMPAZINE) 10 MG tablet Take 10 mg by mouth every 6 (six) hours as needed.      No current facility-administered medications on file prior to visit.    Review of Systems:  As per HPI- otherwise negative.   Physical Examination: Vitals:   12/24/19 0823 12/24/19 0842  BP: 98/60   Pulse: (!) 124 (!) 105  Resp: 19   SpO2: 90% 97%   Vitals:   12/24/19 0823  Weight: 189 lb (85.7 kg)  Height: 6' 2"  (1.88 m)   Body mass index is 24.27 kg/m. Ideal Body Weight: Weight in (lb) to have BMI = 25: 194.3  GEN: no acute distress.  Has lost weight but is not underweight now  HEENT: Atraumatic, Normocephalic.  Ears and Nose: No external deformity. CV: RRR, No M/G/R. No JVD. No thrill. No extra heart sounds. PULM: CTA B, no wheezes, crackles, rhonchi. No retractions. No resp. distress. No accessory muscle use. ABD: S, NT, ND. No rebound. No HSM. EXTR: No c/c/e PSYCH: Normally interactive. Conversant.  Foot exam-done today  Left ear- pt notes a non healing skin lesion on the left superior pinna.     Assessment and  Plan: Controlled type 2 diabetes mellitus without complication, without long-term current use of insulin ([place]) - Plan: Hemoglobin A1c  Skin lesion of left ear - Plan: Ambulatory referral to Dermatology  Hyperlipidemia, unspecified hyperlipidemia type  Essential hypertension, benign  History of CVA (cerebrovascular accident)  Malignant neoplasm of right upper lobe of lung ([place])  Pt here today for a follow-up visit Sadly Britton was dx with advanced lung cancer earlier this year- discovered when he was inpt with stroke  He is struggling with SE of chemo, losing weight, moving into a continuing care neighborhood with his wife.  At this time he declines medication for depression, he will let me know if he needs this BP is low- can stop lisinopril Check A1c today- may be able to reduce metformin dose as well Referral to derm for concern of possible skin cancer left ear  This visit occurred during the SARS-CoV-2 public health emergency.  Safety protocols were in place, including screening questions prior to the visit, additional usage of staff PPE, and extensive cleaning of exam room while observing appropriate contact time as indicated for disinfecting solutions.    Signed Lamar Blinks, MD  Addendum 9/24, received his A1c as below His A1c is slightly higher Currently taking Metformin, this may be exacerbating his chemo associated nausea Message to patient Results for orders placed or performed in visit on 12/24/19  Hemoglobin A1c  Result Value Ref Range   Hgb A1c MFr Bld 8.4 (H) <5.7 % of total Hgb   Mean Plasma Glucose 194 (calc)   eAG (mmol/L) 10.8 (calc)    Unfortunately, you were correct that your A1c has come up about one-point Tight glucose control is not our biggest concern for you at this time, and I do not want to over treat you and add any more side effects.  However, getting your A1c under 8% may help your overall health and response to cancer treatment.  I would  suggest adding a  medication called Januvia to your regimen.  It is not the strongest medication in our arsenal, but it tends to be well-tolerated without excessive side effects.  If you agree, I will send in a prescription for you to try.  Continue taking Metformin  Let me know your thoughts, take care

## 2019-12-23 ENCOUNTER — Inpatient Hospital Stay: Payer: Medicare HMO

## 2019-12-23 ENCOUNTER — Other Ambulatory Visit: Payer: Self-pay

## 2019-12-23 DIAGNOSIS — Z5111 Encounter for antineoplastic chemotherapy: Secondary | ICD-10-CM | POA: Diagnosis not present

## 2019-12-23 DIAGNOSIS — C3411 Malignant neoplasm of upper lobe, right bronchus or lung: Secondary | ICD-10-CM

## 2019-12-23 LAB — CBC WITH DIFFERENTIAL (CANCER CENTER ONLY)
Abs Immature Granulocytes: 0 10*3/uL (ref 0.00–0.07)
Basophils Absolute: 0 10*3/uL (ref 0.0–0.1)
Basophils Relative: 1 %
Eosinophils Absolute: 0 10*3/uL (ref 0.0–0.5)
Eosinophils Relative: 1 %
HCT: 28.4 % — ABNORMAL LOW (ref 39.0–52.0)
Hemoglobin: 9.7 g/dL — ABNORMAL LOW (ref 13.0–17.0)
Immature Granulocytes: 0 %
Lymphocytes Relative: 27 %
Lymphs Abs: 0.4 10*3/uL — ABNORMAL LOW (ref 0.7–4.0)
MCH: 30.9 pg (ref 26.0–34.0)
MCHC: 34.2 g/dL (ref 30.0–36.0)
MCV: 90.4 fL (ref 80.0–100.0)
Monocytes Absolute: 0.1 10*3/uL (ref 0.1–1.0)
Monocytes Relative: 7 %
Neutro Abs: 1 10*3/uL — ABNORMAL LOW (ref 1.7–7.7)
Neutrophils Relative %: 64 %
Platelet Count: 115 10*3/uL — ABNORMAL LOW (ref 150–400)
RBC: 3.14 MIL/uL — ABNORMAL LOW (ref 4.22–5.81)
RDW: 15.3 % (ref 11.5–15.5)
WBC Count: 1.6 10*3/uL — ABNORMAL LOW (ref 4.0–10.5)
nRBC: 0 % (ref 0.0–0.2)

## 2019-12-23 LAB — CMP (CANCER CENTER ONLY)
ALT: 28 U/L (ref 0–44)
AST: 21 U/L (ref 15–41)
Albumin: 3.5 g/dL (ref 3.5–5.0)
Alkaline Phosphatase: 76 U/L (ref 38–126)
Anion gap: 8 (ref 5–15)
BUN: 28 mg/dL — ABNORMAL HIGH (ref 8–23)
CO2: 22 mmol/L (ref 22–32)
Calcium: 9.1 mg/dL (ref 8.9–10.3)
Chloride: 107 mmol/L (ref 98–111)
Creatinine: 1.21 mg/dL (ref 0.61–1.24)
GFR, Est AFR Am: >60 mL/min (ref >60)
GFR, Estimated: >60 mL/min (ref >60)
Glucose, Bld: 202 mg/dL — ABNORMAL HIGH (ref 70–99)
Potassium: 4.9 mmol/L (ref 3.5–5.1)
Sodium: 137 mmol/L (ref 135–145)
Total Bilirubin: 1.1 mg/dL (ref 0.3–1.2)
Total Protein: 6.9 g/dL (ref 6.5–8.1)

## 2019-12-23 LAB — TSH: TSH: 0.834 u[IU]/mL (ref 0.320–4.118)

## 2019-12-24 ENCOUNTER — Ambulatory Visit (INDEPENDENT_AMBULATORY_CARE_PROVIDER_SITE_OTHER): Payer: Medicare HMO | Admitting: Family Medicine

## 2019-12-24 ENCOUNTER — Encounter: Payer: Self-pay | Admitting: Family Medicine

## 2019-12-24 VITALS — BP 98/60 | HR 105 | Resp 19 | Ht 74.0 in | Wt 189.0 lb

## 2019-12-24 DIAGNOSIS — E119 Type 2 diabetes mellitus without complications: Secondary | ICD-10-CM | POA: Diagnosis not present

## 2019-12-24 DIAGNOSIS — H6192 Disorder of left external ear, unspecified: Secondary | ICD-10-CM | POA: Diagnosis not present

## 2019-12-24 DIAGNOSIS — C3411 Malignant neoplasm of upper lobe, right bronchus or lung: Secondary | ICD-10-CM

## 2019-12-24 DIAGNOSIS — E785 Hyperlipidemia, unspecified: Secondary | ICD-10-CM

## 2019-12-24 DIAGNOSIS — I10 Essential (primary) hypertension: Secondary | ICD-10-CM | POA: Diagnosis not present

## 2019-12-24 DIAGNOSIS — Z8673 Personal history of transient ischemic attack (TIA), and cerebral infarction without residual deficits: Secondary | ICD-10-CM | POA: Diagnosis not present

## 2019-12-24 NOTE — Telephone Encounter (Signed)
  Radiation Oncology         [phone]) [phone] ________________________________  Name: Micheal Oliver: [id number]  Date of Service: 12/22/2019  DOB: 1951-01-20  Post Treatment Telephone Note  Diagnosis:   Stage IV, [id number], NSCLC, adenocarcinoma of the RUL.  Interval Since Last Radiation:  4 weeks   11/03/19 - 11/23/19:  The right upper lobe lung was treated to a dose of 37.5 Gy in 10 fractions using a 3-field 3D conformal technique.  Narrative:  The patient was contacted today for routine follow-up. During treatment he did very well with radiotherapy and did not have significant desquamation. He reports he is feeling better now, but did in retrospect have pain with swallowing, and did feel that this was pretty significant now that he can eat again. He reports he found hycet to be helpful but felt like he was being judged when he requested a refill of this.   Impression/Plan: 1. Stage IV, [id number], NSCLC, adenocarcinoma of the RUL. The patient has been doing well since completion of radiotherapy but was frustrated by esophagitis and the way his medication was handled as he was finding it helpful. I apologized for his experience, and let him know I"d be happy to pass along his feedback.  We discussed that we would be happy to continue to follow him as needed, but he will also continue to follow up with Dr. Julien Nordmann in medical oncology as he continues with systemic therapy.  2. Thyroid cancer. The patient plans to proceed with an evaluation with Dr. Harlow Asa. This will be followed expectantly.    Carola Rhine, PAC

## 2019-12-25 ENCOUNTER — Encounter: Payer: Self-pay | Admitting: Family Medicine

## 2019-12-25 LAB — HEMOGLOBIN A1C
Hgb A1c MFr Bld: 8.4 % of total Hgb — ABNORMAL HIGH (ref <5.7)
Mean Plasma Glucose: 194 (calc)
eAG (mmol/L): 10.8 (calc)

## 2019-12-25 MED ORDER — SITAGLIPTIN PHOSPHATE 100 MG PO TABS: 100.0000 mg | ORAL_TABLET | Freq: Every day | ORAL | 6 refills | Status: DC

## 2019-12-30 ENCOUNTER — Inpatient Hospital Stay: Payer: Medicare HMO

## 2019-12-30 ENCOUNTER — Other Ambulatory Visit: Payer: Self-pay

## 2019-12-30 DIAGNOSIS — Z5111 Encounter for antineoplastic chemotherapy: Secondary | ICD-10-CM | POA: Diagnosis not present

## 2019-12-30 DIAGNOSIS — C3411 Malignant neoplasm of upper lobe, right bronchus or lung: Secondary | ICD-10-CM

## 2019-12-30 LAB — CMP (CANCER CENTER ONLY)
ALT: 23 U/L (ref 0–44)
AST: 17 U/L (ref 15–41)
Albumin: 3.5 g/dL (ref 3.5–5.0)
Alkaline Phosphatase: 76 U/L (ref 38–126)
Anion gap: 6 (ref 5–15)
BUN: 17 mg/dL (ref 8–23)
CO2: 26 mmol/L (ref 22–32)
Calcium: 9.3 mg/dL (ref 8.9–10.3)
Chloride: 109 mmol/L (ref 98–111)
Creatinine: 1.28 mg/dL — ABNORMAL HIGH (ref 0.61–1.24)
GFR, Est AFR Am: >60 mL/min (ref >60)
GFR, Estimated: 57 mL/min — ABNORMAL LOW (ref >60)
Glucose, Bld: 199 mg/dL — ABNORMAL HIGH (ref 70–99)
Potassium: 4.4 mmol/L (ref 3.5–5.1)
Sodium: 141 mmol/L (ref 135–145)
Total Bilirubin: 0.5 mg/dL (ref 0.3–1.2)
Total Protein: 6.7 g/dL (ref 6.5–8.1)

## 2019-12-30 LAB — CBC WITH DIFFERENTIAL (CANCER CENTER ONLY)
Abs Immature Granulocytes: 0.04 10*3/uL (ref 0.00–0.07)
Basophils Absolute: 0 10*3/uL (ref 0.0–0.1)
Basophils Relative: 1 %
Eosinophils Absolute: 0.1 10*3/uL (ref 0.0–0.5)
Eosinophils Relative: 2 %
HCT: 23.6 % — ABNORMAL LOW (ref 39.0–52.0)
Hemoglobin: 8 g/dL — ABNORMAL LOW (ref 13.0–17.0)
Immature Granulocytes: 2 %
Lymphocytes Relative: 23 %
Lymphs Abs: 0.5 10*3/uL — ABNORMAL LOW (ref 0.7–4.0)
MCH: 30.8 pg (ref 26.0–34.0)
MCHC: 33.9 g/dL (ref 30.0–36.0)
MCV: 90.8 fL (ref 80.0–100.0)
Monocytes Absolute: 0.3 10*3/uL (ref 0.1–1.0)
Monocytes Relative: 15 %
Neutro Abs: 1.3 10*3/uL — ABNORMAL LOW (ref 1.7–7.7)
Neutrophils Relative %: 57 %
Platelet Count: 73 10*3/uL — ABNORMAL LOW (ref 150–400)
RBC: 2.6 MIL/uL — ABNORMAL LOW (ref 4.22–5.81)
RDW: 15.1 % (ref 11.5–15.5)
WBC Count: 2.2 10*3/uL — ABNORMAL LOW (ref 4.0–10.5)
nRBC: 0.9 % — ABNORMAL HIGH (ref 0.0–0.2)

## 2019-12-30 LAB — TSH: TSH: 0.965 u[IU]/mL (ref 0.320–4.118)

## 2020-01-04 ENCOUNTER — Other Ambulatory Visit: Payer: Self-pay

## 2020-01-04 ENCOUNTER — Ambulatory Visit (HOSPITAL_COMMUNITY)
Admission: RE | Admit: 2020-01-04 | Discharge: 2020-01-04 | Disposition: A | Discharge status: Home / Self Care | Payer: Medicare HMO | Source: Ambulatory Visit | Attending: Internal Medicine | Admitting: Internal Medicine

## 2020-01-04 DIAGNOSIS — Z5111 Encounter for antineoplastic chemotherapy: Secondary | ICD-10-CM | POA: Diagnosis not present

## 2020-01-04 DIAGNOSIS — C3490 Malignant neoplasm of unspecified part of unspecified bronchus or lung: Secondary | ICD-10-CM | POA: Diagnosis present

## 2020-01-04 DIAGNOSIS — K5730 Diverticulosis of large intestine without perforation or abscess without bleeding: Secondary | ICD-10-CM | POA: Diagnosis not present

## 2020-01-04 DIAGNOSIS — N210 Calculus in bladder: Secondary | ICD-10-CM | POA: Diagnosis not present

## 2020-01-04 DIAGNOSIS — I700 Atherosclerosis of aorta: Secondary | ICD-10-CM | POA: Diagnosis not present

## 2020-01-04 DIAGNOSIS — C3411 Malignant neoplasm of upper lobe, right bronchus or lung: Secondary | ICD-10-CM | POA: Diagnosis not present

## 2020-01-04 DIAGNOSIS — J219 Acute bronchiolitis, unspecified: Secondary | ICD-10-CM | POA: Diagnosis not present

## 2020-01-04 MED ORDER — IOHEXOL 300 MG/ML  SOLN
100.0000 mL | Freq: Once | INTRAMUSCULAR | Status: AC | PRN
  Administered 2020-01-04: 100 mL via INTRAVENOUS

## 2020-01-04 NOTE — Progress Notes (Signed)
[facility] OFFICE PROGRESS NOTE  Copland, Gay Filler, MD [address] [place] [address]  DIAGNOSIS: Stage IV(T3, N2, M1a) non-small cell lung cancer, adenocarcinoma presented with large right upper lobe lung mass extending to the right suprahilar region in addition to right hilar and mediastinal lymphadenopathy with obstruction of the associated upper lobe bronchusandbilateral subcentimeter pulmonary nodulesdiagnosed in July 2021.  Biomarker Findings Microsatellite status - MS-Stable Tumor Mutational Burden - 3 Muts/Mb Genomic Findings For a complete list of the genes assayed, please refer to the Appendix. ERBB2 [id number], amplification - equivocal? HGF amplification - equivocal? MTAP loss exons 5-8 MYC amplification - equivocal? CDKN2A/B CDKN2B loss, CDKN2A loss CUL3 Y2f*19 RPTOR amplification TP53 [id number] Disease relevant genes with no reportable alterations: ALK, BRAF, EGFR, KRAS, MET, RET, ROS1  PDL1 Expression: Negative  PRIOR THERAPY: Palliative radiotherapy to the obstructive lung mass under the care of Dr. MoLisbeth RenshawThe past treatment is scheduled for8/23/21  CURRENT THERAPY: Systemic chemotherapy with carboplatin for AUC of 5, Alimta 500 mg/M2 and Keytruda 200 mg IV every 3 weeks. First dose November 04, 2019.Status post 3 cycles.   INTERVAL HISTORY: Micheal Bellanca[age].o. male returns to the clinic today for a follow-up visit.  The patient is feeling well today without any concerning complaints. In the interval, the patient saw Dr. GeHarlow Asarom surgery for a thyroid nodule.  Nodule is benign and no surgery is indicated at this time. Otherwise, he has been tolerating his current treatment well without any concerning adverse effects except for fatigue and decreased appetite. He typically feels tired and has a decreased appetite 2 days after treatment. By day 3, he has constipation and takes ex-lax. Then he typically starts  feeling better.  The patient denies any recent fever or night sweats. His weight is stable and he follows with a member of the nutritionist team. Denies any chest pain, cough, or hemoptysis but reports dyspnea on exertion. He is wondering if he would qualify for a handicap placard due to feeling short of breath walking in the parking lot and needing to take breaks. He feels like his cough is worsening. His cough is exacerbated after eating dinner. He presently does not take reflux medication, but had in the past. He denies any current nausea, vomiting, diarrhea. He denies any headache or visual changes.  He denies any rashes but he is seeing a dermatologist for a lesion on his ear tomorrow. The patient recently had a restaging CT scan performed.  He is here today for evaluation and to review his scan results before starting cycle #4.  MEDICAL HISTORY: Past Medical History:  Diagnosis Date  . Chicken pox   . DM2 (diabetes mellitus, type 2) ([place]  . History of kidney stones    passed several   . Hypertension   . Kidney stones    5-6 times  . Measles   . Mumps   . [place] [place]   x 2, last one was 09/18/2019- has some expressive aphasia    ALLERGIES:  is allergic to bee venom.  MEDICATIONS:  Current Outpatient Medications  Medication Sig Dispense Refill  . acetaminophen (TYLENOL) 500 MG tablet Take 500 mg by mouth every 6 (six) hours as needed.     . Marland Kitchentorvastatin (LIPITOR) 40 MG tablet Take 1 tablet (40 mg total) by mouth daily at 6 PM. 90 tablet 3  . clopidogrel (PLAVIX) 75 MG tablet Take 1 tablet (75 mg total) by mouth daily.  Okay to restart this medication on 10/01/2019. 90 tablet 3  . folic acid (FOLVITE) 1 MG tablet Take 1 tablet (1 mg total) by mouth daily. 30 tablet 4  . gabapentin (NEURONTIN) 300 MG capsule Take 1 capsule (300 mg total) by mouth at bedtime. 90 capsule 3  . HYDROcodone-homatropine (HYCODAN) 5-1.5 MG/5ML syrup Take 5 mLs by mouth every 6 (six) hours as needed for cough.  120 mL 0  . metFORMIN (GLUCOPHAGE) 500 MG tablet TAKE TWO TABLETS BY MOUTH TWICE A DAY WITH MEAL(S) 360 tablet 3  . Omega-3 Fatty Acids (FISH OIL) 1200 MG CAPS Take by mouth daily.    . prochlorperazine (COMPAZINE) 10 MG tablet Take 10 mg by mouth every 6 (six) hours as needed.     . sitaGLIPtin (JANUVIA) 100 MG tablet Take 1 tablet (100 mg total) by mouth daily. 30 tablet 6   No current facility-administered medications for this visit.   Facility-Administered Medications Ordered in Other Visits  Medication Dose Route Frequency Provider Last Rate Last Admin  . CARBOplatin (PARAPLATIN) 540 mg in sodium chloride 0.9 % 250 mL chemo infusion  540 mg Intravenous Once Curt Bears, MD      . dexamethasone (DECADRON) 10 mg in sodium chloride 0.9 % 50 mL IVPB  10 mg Intravenous Once Curt Bears, MD      . fosaprepitant (EMEND) 150 mg in sodium chloride 0.9 % 145 mL IVPB  150 mg Intravenous Once Curt Bears, MD      . palonosetron (ALOXI) injection 0.25 mg  0.25 mg Intravenous Once Curt Bears, MD      . pembrolizumab [facility]) 200 mg in sodium chloride 0.9 % 50 mL chemo infusion  200 mg Intravenous Once Curt Bears, MD      . PEMEtrexed (ALIMTA) 1,100 mg in sodium chloride 0.9 % 100 mL chemo infusion  500 mg/m2 (Treatment Plan Recorded) Intravenous Once Curt Bears, MD        SURGICAL HISTORY:  Past Surgical History:  Procedure Laterality Date  . COLONOSCOPY W/ POLYPECTOMY    . VIDEO BRONCHOSCOPY WITH ENDOBRONCHIAL ULTRASOUND N/A 09/30/2019   Procedure: VIDEO BRONCHOSCOPY WITH ENDOBRONCHIAL ULTRASOUND;  Surgeon: Collene Gobble, MD;  Location: [facility];  Service: Thoracic;  Laterality: N/A;  . WISDOM TOOTH EXTRACTION  1976-77    REVIEW OF SYSTEMS:   Review of Systems  Constitutional: Positive for fatigue and appetite change following chemo. Negative for chills, weight loss, or fever. HENT:   Negative for mouth sores, nosebleeds, sore throat and trouble swallowing.   Eyes: Negative for eye problems and icterus.  Respiratory: Positive for dyspnea on exertion positive and cough. Negative for hemoptysis  and wheezing.   Cardiovascular: Negative for chest pain and leg swelling.  Gastrointestinal: Positive for constipation following chemo. Negative for abdominal pain,  diarrhea, nausea and vomiting.  Genitourinary: Negative for bladder incontinence, difficulty urinating, dysuria, frequency and hematuria.   Musculoskeletal: Negative for back pain, gait problem, neck pain and neck stiffness.  Skin: Negative for itching and rash.  Neurological: Negative for dizziness, extremity weakness, gait problem, headaches, light-headedness and seizures.  Hematological: Negative for adenopathy. Does not bruise/bleed easily.  Psychiatric/Behavioral: Negative for confusion, depression and sleep disturbance. The patient is not nervous/anxious.      PHYSICAL EXAMINATION:  Blood pressure 135/74, pulse 100, temperature 98.1 F (36.7 C), temperature source Tympanic, resp. rate 17, height 6' 2"  (1.88 m), weight 190 lb 12.8 oz (86.5 kg), SpO2 100 %.  ECOG PERFORMANCE STATUS: 1 - Symptomatic but  completely ambulatory  Physical Exam  Constitutional: Oriented to person, place, and time and well-developed, well-nourished, and in no distress.  HENT:  Head: Normocephalic and atraumatic.  Mouth/Throat: Oropharynx is clear and moist. No oropharyngeal exudate.  Eyes: Conjunctivae are normal. Right eye exhibits no discharge. Left eye exhibits no discharge. No scleral icterus.  Neck: Normal range of motion. Neck supple.  Cardiovascular: Normal rate, regular rhythm, normal heart sounds and intact distal pulses.   Pulmonary/Chest: Effort normal and breath sounds normal. No respiratory distress. No wheezes. No rales.  Abdominal: Soft. Bowel sounds are normal. Exhibits no distension and no mass. There is no tenderness.  Musculoskeletal: Normal range of motion. Exhibits no edema.   Lymphadenopathy:    No cervical adenopathy.  Neurological: Alert and oriented to person, place, and time. Exhibits normal muscle tone. Gait normal. Coordination normal.  Skin: Skin is warm and dry. Positive for erythematous lesion on left ear. Not diaphoretic. No erythema. No pallor.  Psychiatric: Mood, memory and judgment normal.  Vitals reviewed.  LABORATORY DATA: Lab Results  Component Value Date   WBC 3.3 (L) 01/06/2020   HGB 9.0 (L) 01/06/2020   HCT 27.3 (L) 01/06/2020   MCV 95.1 01/06/2020   PLT 240 01/06/2020      Chemistry      Component Value Date/Time   NA 141 01/06/2020 0838   K 4.2 01/06/2020 0838   CL 112 (H) 01/06/2020 0838   CO2 24 01/06/2020 0838   BUN 16 01/06/2020 0838   CREATININE 1.15 01/06/2020 0838   CREATININE 1.13 10/16/2013 1118      Component Value Date/Time   CALCIUM 9.4 01/06/2020 0838   ALKPHOS 87 01/06/2020 0838   AST 25 01/06/2020 0838   ALT 25 01/06/2020 0838   BILITOT 0.5 01/06/2020 0838       RADIOGRAPHIC STUDIES:  CT Chest W Contrast  Result Date: 01/04/2020 CLINICAL DATA:  Non-small cell right upper lobe lung cancer diagnosed in July with ongoing chemotherapy, completed radiation therapy. Restaging. EXAM: CT CHEST, ABDOMEN, AND PELVIS WITH CONTRAST TECHNIQUE: Multidetector CT imaging of the chest, abdomen and pelvis was performed following the standard protocol during bolus administration of intravenous contrast. CONTRAST:  140m OMNIPAQUE IOHEXOL 300 MG/ML  SOLN COMPARISON:  10/26/2019 PET-CT.  09/19/2019 chest CT. FINDINGS: CT CHEST FINDINGS Cardiovascular: Normal heart size. No significant pericardial effusion/thickening. Normal course and caliber of the thoracic aorta. Top-normal caliber main pulmonary artery (3.0 cm diameter). No central pulmonary emboli. Mediastinum/Nodes: No discrete thyroid nodules. Unremarkable esophagus. No axillary adenopathy. Mildly enlarged 1.0 cm lower right paratracheal node (series 2/image 22), mildly  decreased from 1.3 cm. Subcarinal 0.7 cm node (series 2/image 20) is decreased from 1.7 cm. No new pathologically enlarged mediastinal nodes. No pathologically enlarged hilar nodes. Lungs/Pleura: No pneumothorax. No pleural effusion. Central right upper lobe 4.5 x 3.2 cm lung mass (series 6/image 34) with associated volume loss, decreased from 6.0 x 3.9 cm. Calcified subcentimeter peripheral left lower lobe granuloma is stable. Numerous previously visualized scattered solid pulmonary nodules in both lungs, largest 5 mm in the posterior right lower lobe (series 6/image 91), not appreciably changed. Minimal new tree-in-bud type opacity in the anterior left upper lobe (series 6/image 62). Musculoskeletal: No aggressive appearing focal osseous lesions. Mild thoracic spondylosis. Subcutaneous loop recorder in the ventral left chest wall. CT ABDOMEN PELVIS FINDINGS Hepatobiliary: Normal liver with no liver mass. Normal gallbladder with no radiopaque cholelithiasis. No biliary ductal dilatation. Pancreas: Normal, with no mass or duct dilation. Spleen: Normal  size. No mass. Adrenals/Urinary Tract: Normal adrenals. Normal kidneys with no hydronephrosis and no renal mass. Dependent 11 mm right bladder stone. No bladder wall thickening. Stomach/Bowel: Normal non-distended stomach. Normal caliber small bowel with no small bowel wall thickening. Normal appendix. Oral contrast transits to the colon. Marked diffuse colonic diverticulosis, most prominent in the sigmoid colon, with no acute large bowel wall thickening or significant pericolonic fat stranding. Vascular/Lymphatic: Atherosclerotic nonaneurysmal abdominal aorta. Patent portal, splenic, hepatic and renal veins. No pathologically enlarged lymph nodes in the abdomen or pelvis. Reproductive: Normal size prostate. Other: No pneumoperitoneum, ascites or focal fluid collection. Musculoskeletal: No aggressive appearing focal osseous lesions. Moderate lumbar spondylosis.  IMPRESSION: 1. Central right upper lobe lung mass is decreased in size. 2. Mild mediastinal lymphadenopathy is mildly decreased. 3. Subcentimeter bilateral pulmonary nodules are stable. Minimal new tree-in-bud type opacity in the anterior left upper lobe, favoring minimal nonspecific bronchiolitis. 4. No findings suspicious for new or progressive metastatic disease in the chest. No evidence of metastatic disease in the abdomen or pelvis. 5. Chronic findings include: Marked diffuse colonic diverticulosis. 11 mm bladder stone. Aortic Atherosclerosis (ICD10-I70.0). Electronically Signed   By: Ilona Sorrel M.D.   On: 01/04/2020 09:23   CT Abdomen Pelvis W Contrast  Result Date: 01/04/2020 CLINICAL DATA:  Non-small cell right upper lobe lung cancer diagnosed in July with ongoing chemotherapy, completed radiation therapy. Restaging. EXAM: CT CHEST, ABDOMEN, AND PELVIS WITH CONTRAST TECHNIQUE: Multidetector CT imaging of the chest, abdomen and pelvis was performed following the standard protocol during bolus administration of intravenous contrast. CONTRAST:  17m OMNIPAQUE IOHEXOL 300 MG/ML  SOLN COMPARISON:  10/26/2019 PET-CT.  09/19/2019 chest CT. FINDINGS: CT CHEST FINDINGS Cardiovascular: Normal heart size. No significant pericardial effusion/thickening. Normal course and caliber of the thoracic aorta. Top-normal caliber main pulmonary artery (3.0 cm diameter). No central pulmonary emboli. Mediastinum/Nodes: No discrete thyroid nodules. Unremarkable esophagus. No axillary adenopathy. Mildly enlarged 1.0 cm lower right paratracheal node (series 2/image 22), mildly decreased from 1.3 cm. Subcarinal 0.7 cm node (series 2/image 20) is decreased from 1.7 cm. No new pathologically enlarged mediastinal nodes. No pathologically enlarged hilar nodes. Lungs/Pleura: No pneumothorax. No pleural effusion. Central right upper lobe 4.5 x 3.2 cm lung mass (series 6/image 34) with associated volume loss, decreased from 6.0 x 3.9  cm. Calcified subcentimeter peripheral left lower lobe granuloma is stable. Numerous previously visualized scattered solid pulmonary nodules in both lungs, largest 5 mm in the posterior right lower lobe (series 6/image 91), not appreciably changed. Minimal new tree-in-bud type opacity in the anterior left upper lobe (series 6/image 62). Musculoskeletal: No aggressive appearing focal osseous lesions. Mild thoracic spondylosis. Subcutaneous loop recorder in the ventral left chest wall. CT ABDOMEN PELVIS FINDINGS Hepatobiliary: Normal liver with no liver mass. Normal gallbladder with no radiopaque cholelithiasis. No biliary ductal dilatation. Pancreas: Normal, with no mass or duct dilation. Spleen: Normal size. No mass. Adrenals/Urinary Tract: Normal adrenals. Normal kidneys with no hydronephrosis and no renal mass. Dependent 11 mm right bladder stone. No bladder wall thickening. Stomach/Bowel: Normal non-distended stomach. Normal caliber small bowel with no small bowel wall thickening. Normal appendix. Oral contrast transits to the colon. Marked diffuse colonic diverticulosis, most prominent in the sigmoid colon, with no acute large bowel wall thickening or significant pericolonic fat stranding. Vascular/Lymphatic: Atherosclerotic nonaneurysmal abdominal aorta. Patent portal, splenic, hepatic and renal veins. No pathologically enlarged lymph nodes in the abdomen or pelvis. Reproductive: Normal size prostate. Other: No pneumoperitoneum, ascites or focal fluid collection. Musculoskeletal:  No aggressive appearing focal osseous lesions. Moderate lumbar spondylosis. IMPRESSION: 1. Central right upper lobe lung mass is decreased in size. 2. Mild mediastinal lymphadenopathy is mildly decreased. 3. Subcentimeter bilateral pulmonary nodules are stable. Minimal new tree-in-bud type opacity in the anterior left upper lobe, favoring minimal nonspecific bronchiolitis. 4. No findings suspicious for new or progressive metastatic  disease in the chest. No evidence of metastatic disease in the abdomen or pelvis. 5. Chronic findings include: Marked diffuse colonic diverticulosis. 11 mm bladder stone. Aortic Atherosclerosis (ICD10-I70.0). Electronically Signed   By: Ilona Sorrel M.D.   On: 01/04/2020 09:23     ASSESSMENT/PLAN:  This is a very pleasant 25 year oldCaucasianmale recently diagnosed with a stage IV (T3, N2, M1 a) non-small cell lung cancer, adenocarcinoma presented with large right upper lobe lung mass with extension to the right suprahilar region as well as right hilar and mediastinal lymphadenopathy with obstruction of the right upper lobe and bilateral pulmonary nodules diagnosed in July 2021. Negative PDL1 expression. He has no actionable mutations.   The patient completed palliative radiotherapy to the obstructive lung mass under the care of Dr. Lisbeth Renshaw. The last treatment was scheduled for 11/23/19  He is currently undergoing palliative systemic chemotherapy with carboplatin for an AUC of 5, Alimta 500 mg/m2, and Keytruda 200 mg IV every 3 weeks. He is status post 3 cycles and tolerated itwell except for fatigue and decreased appetite.  The patient recently had a restaging CT scan performed.  Dr. Julien Nordmann personally and independently reviewed the scan and discussed results with the patient today.  The scan showed no evidence of disease progression.  Dr. Julien Nordmann recommends the patient continue on the same treatment at the same dose.  He will receive cycle #4 today.  We will see him back for follow-up visit in 3 weeks for evaluation before starting cycle #5.  He will continue to follow with a member of the nutritionist team. His weight is stable.   I will refill his hycodan for his cough. I also instructed the patient to try taking a PPI since his cough is exacerbated by eating. I also encouraged him to take his pills with a full glass of water.   I have provided the patient with a handicap placard, which is  appropriate due to his stage IV lung cancer and dyspnea on exertion.   We will arrange for the patient to have the flu shot while in the clinic today.   The patient was advised to call immediately if he has any concerning symptoms in the interval. The patient voices understanding of current disease status and treatment options and is in agreement with the current care plan. All questions were answered. The patient knows to call the clinic with any problems, questions or concerns. We can certainly see the patient much sooner if necessary  No orders of the defined types were placed in this encounter.    Marke Goodwyn L Almer Bushey, PA-C 01/06/20   ADDENDUM: Hematology/Oncology Attending: I had a face-to-face encounter with the patient today.  I recommended his care plan.  This is a very pleasant 69 years old white male diagnosed with a stage IV non-small cell lung cancer, adenocarcinoma with no actionable mutations and negative PD-L1 expression. The patient is currently undergoing systemic chemotherapy with carboplatin, Alimta and Keytruda status post 3 cycles. He has been tolerating his treatment well with no concerning adverse effects except for fatigue and lack of appetite. He had repeat CT scan of the chest, abdomen pelvis  performed recently.  I personally and independently reviewed the scan images and discussed the result and showed the images to the patient today. His scan showed improvement of his disease after the first 3 cycles of his treatment. I recommended for the patient to continue his treatment and he will proceed with cycle #4 today. For the cough he will continue with Hycodan. The patient will come back for follow-up visit in 3 weeks for evaluation before starting cycle #5. He was advised to call immediately if he has any concerning symptoms in the interval.  Disclaimer: This note was dictated with voice recognition software. Similar sounding words can inadvertently be  transcribed and may be missed upon review. Eilleen Kempf, MD 01/06/20

## 2020-01-05 DIAGNOSIS — D440 Neoplasm of uncertain behavior of thyroid gland: Secondary | ICD-10-CM | POA: Diagnosis not present

## 2020-01-05 DIAGNOSIS — E042 Nontoxic multinodular goiter: Secondary | ICD-10-CM | POA: Diagnosis not present

## 2020-01-06 ENCOUNTER — Inpatient Hospital Stay: Payer: Medicare HMO | Admitting: Nutrition

## 2020-01-06 ENCOUNTER — Inpatient Hospital Stay: Payer: Medicare HMO

## 2020-01-06 ENCOUNTER — Other Ambulatory Visit: Payer: Self-pay

## 2020-01-06 ENCOUNTER — Inpatient Hospital Stay: Payer: Medicare HMO | Attending: Internal Medicine | Admitting: Physician Assistant

## 2020-01-06 ENCOUNTER — Encounter: Payer: Self-pay | Admitting: Physician Assistant

## 2020-01-06 VITALS — BP 135/74 | HR 100 | Temp 98.1°F | Resp 17 | Ht 74.0 in | Wt 190.8 lb

## 2020-01-06 DIAGNOSIS — C3411 Malignant neoplasm of upper lobe, right bronchus or lung: Secondary | ICD-10-CM

## 2020-01-06 DIAGNOSIS — Z79899 Other long term (current) drug therapy: Secondary | ICD-10-CM | POA: Diagnosis not present

## 2020-01-06 DIAGNOSIS — Z23 Encounter for immunization: Secondary | ICD-10-CM | POA: Insufficient documentation

## 2020-01-06 DIAGNOSIS — Z5112 Encounter for antineoplastic immunotherapy: Secondary | ICD-10-CM

## 2020-01-06 DIAGNOSIS — Z5111 Encounter for antineoplastic chemotherapy: Secondary | ICD-10-CM | POA: Diagnosis not present

## 2020-01-06 DIAGNOSIS — R059 Cough, unspecified: Secondary | ICD-10-CM

## 2020-01-06 LAB — CMP (CANCER CENTER ONLY)
ALT: 25 U/L (ref 0–44)
AST: 25 U/L (ref 15–41)
Albumin: 3.6 g/dL (ref 3.5–5.0)
Alkaline Phosphatase: 87 U/L (ref 38–126)
Anion gap: 5 (ref 5–15)
BUN: 16 mg/dL (ref 8–23)
CO2: 24 mmol/L (ref 22–32)
Calcium: 9.4 mg/dL (ref 8.9–10.3)
Chloride: 112 mmol/L — ABNORMAL HIGH (ref 98–111)
Creatinine: 1.15 mg/dL (ref 0.61–1.24)
GFR, Estimated: >60 mL/min (ref >60)
Glucose, Bld: 158 mg/dL — ABNORMAL HIGH (ref 70–99)
Potassium: 4.2 mmol/L (ref 3.5–5.1)
Sodium: 141 mmol/L (ref 135–145)
Total Bilirubin: 0.5 mg/dL (ref 0.3–1.2)
Total Protein: 6.8 g/dL (ref 6.5–8.1)

## 2020-01-06 LAB — CBC WITH DIFFERENTIAL (CANCER CENTER ONLY)
Abs Immature Granulocytes: 0.12 10*3/uL — ABNORMAL HIGH (ref 0.00–0.07)
Basophils Absolute: 0 10*3/uL (ref 0.0–0.1)
Basophils Relative: 1 %
Eosinophils Absolute: 0.1 10*3/uL (ref 0.0–0.5)
Eosinophils Relative: 2 %
HCT: 27.3 % — ABNORMAL LOW (ref 39.0–52.0)
Hemoglobin: 9 g/dL — ABNORMAL LOW (ref 13.0–17.0)
Immature Granulocytes: 4 %
Lymphocytes Relative: 17 %
Lymphs Abs: 0.6 10*3/uL — ABNORMAL LOW (ref 0.7–4.0)
MCH: 31.4 pg (ref 26.0–34.0)
MCHC: 33 g/dL (ref 30.0–36.0)
MCV: 95.1 fL (ref 80.0–100.0)
Monocytes Absolute: 0.6 10*3/uL (ref 0.1–1.0)
Monocytes Relative: 20 %
Neutro Abs: 1.9 10*3/uL (ref 1.7–7.7)
Neutrophils Relative %: 56 %
Platelet Count: 240 10*3/uL (ref 150–400)
RBC: 2.87 MIL/uL — ABNORMAL LOW (ref 4.22–5.81)
RDW: 19 % — ABNORMAL HIGH (ref 11.5–15.5)
WBC Count: 3.3 10*3/uL — ABNORMAL LOW (ref 4.0–10.5)
nRBC: 0 % (ref 0.0–0.2)

## 2020-01-06 MED ORDER — HYDROCODONE-HOMATROPINE 5-1.5 MG/5ML PO SYRP: 5.0000 mL | ORAL_SOLUTION | Freq: Four times a day (QID) | ORAL | 0 refills | Status: DC | PRN

## 2020-01-06 MED ORDER — SODIUM CHLORIDE 0.9 % IV SOLN
500.0000 mg/m2 | Freq: Once | INTRAVENOUS | Status: AC
  Administered 2020-01-06: 1100 mg via INTRAVENOUS
  Filled 2020-01-06: qty 40

## 2020-01-06 MED ORDER — SODIUM CHLORIDE 0.9 % IV SOLN
538.5000 mg | Freq: Once | INTRAVENOUS | Status: AC
  Administered 2020-01-06: 540 mg via INTRAVENOUS
  Filled 2020-01-06: qty 54

## 2020-01-06 MED ORDER — PALONOSETRON HCL INJECTION 0.25 MG/5ML
INTRAVENOUS | Status: AC
  Filled 2020-01-06: qty 5

## 2020-01-06 MED ORDER — INFLUENZA VAC A&B SA ADJ QUAD 0.5 ML IM PRSY
PREFILLED_SYRINGE | INTRAMUSCULAR | Status: AC
  Filled 2020-01-06: qty 0.5

## 2020-01-06 MED ORDER — SODIUM CHLORIDE 0.9 % IV SOLN
Freq: Once | INTRAVENOUS | Status: AC
  Filled 2020-01-06: qty 250

## 2020-01-06 MED ORDER — PALONOSETRON HCL INJECTION 0.25 MG/5ML
0.2500 mg | Freq: Once | INTRAVENOUS | Status: AC
  Administered 2020-01-06: 0.25 mg via INTRAVENOUS

## 2020-01-06 MED ORDER — INFLUENZA VAC A&B SA ADJ QUAD 0.5 ML IM PRSY
0.5000 mL | PREFILLED_SYRINGE | Freq: Once | INTRAMUSCULAR | Status: AC
  Administered 2020-01-06: 0.5 mL via INTRAMUSCULAR

## 2020-01-06 MED ORDER — SODIUM CHLORIDE 0.9 % IV SOLN
200.0000 mg | Freq: Once | INTRAVENOUS | Status: AC
  Administered 2020-01-06: 200 mg via INTRAVENOUS
  Filled 2020-01-06: qty 8

## 2020-01-06 MED ORDER — SODIUM CHLORIDE 0.9 % IV SOLN
10.0000 mg | Freq: Once | INTRAVENOUS | Status: AC
  Administered 2020-01-06: 10 mg via INTRAVENOUS
  Filled 2020-01-06: qty 10

## 2020-01-06 MED ORDER — SODIUM CHLORIDE 0.9 % IV SOLN
150.0000 mg | Freq: Once | INTRAVENOUS | Status: AC
  Administered 2020-01-06: 150 mg via INTRAVENOUS
  Filled 2020-01-06: qty 150

## 2020-01-06 NOTE — Patient Instructions (Signed)
[facility] Discharge Instructions for Patients Receiving Chemotherapy  Today you received the following chemotherapy agents: pembrolizumab/pemetrexed/carboplatin.  To help prevent nausea and vomiting after your treatment, we encourage you to take your nausea medication as directed.   If you develop nausea and vomiting that is not controlled by your nausea medication, call the clinic.   BELOW ARE SYMPTOMS THAT SHOULD BE REPORTED IMMEDIATELY:  *FEVER GREATER THAN 100.5 F  *CHILLS WITH OR WITHOUT FEVER  NAUSEA AND VOMITING THAT IS NOT CONTROLLED WITH YOUR NAUSEA MEDICATION  *UNUSUAL SHORTNESS OF BREATH  *UNUSUAL BRUISING OR BLEEDING  TENDERNESS IN MOUTH AND THROAT WITH OR WITHOUT PRESENCE OF ULCERS  *URINARY PROBLEMS  *BOWEL PROBLEMS  UNUSUAL RASH Items with * indicate a potential emergency and should be followed up as soon as possible.  Feel free to call the clinic should you have any questions or concerns. The clinic phone number is [phone].  Please show the [facility] at check-in to the Emergency Department and triage nurse.

## 2020-01-06 NOTE — Progress Notes (Signed)
Nutrition follow-up completed with patient who receives chemotherapy for lung cancer. Weight is stable and was documented as 190.8 pounds on October 6 increase slightly from 189 pounds September 23. Patient reports he feels better the farther out from chemotherapy he gets. He continues to have some fatigue and decreased appetite. Reports nausea occurs especially when he is coughing. Patient has no questions at this time.  Nutrition diagnosis: Unintended weight loss is stable.  Intervention: Patient was educated to continue to try to eat smaller amounts more often. Encouraged bland foods especially if he is experiencing nausea. Encouraged oral nutrition supplements if tolerated.  Monitoring, evaluation, goals: Patient will tolerate adequate calories and protein for weight maintenance.  Next visit: Wednesday, October 27 during infusion.  **Disclaimer: This note was dictated with voice recognition software. Similar sounding words can inadvertently be transcribed and this note may contain transcription errors which may not have been corrected upon publication of note.**

## 2020-01-07 DIAGNOSIS — L821 Other seborrheic keratosis: Secondary | ICD-10-CM | POA: Diagnosis not present

## 2020-01-07 DIAGNOSIS — G548 Other nerve root and plexus disorders: Secondary | ICD-10-CM | POA: Diagnosis not present

## 2020-01-07 DIAGNOSIS — D485 Neoplasm of uncertain behavior of skin: Secondary | ICD-10-CM | POA: Diagnosis not present

## 2020-01-07 DIAGNOSIS — H61002 Unspecified perichondritis of left external ear: Secondary | ICD-10-CM | POA: Diagnosis not present

## 2020-01-11 ENCOUNTER — Ambulatory Visit (INDEPENDENT_AMBULATORY_CARE_PROVIDER_SITE_OTHER): Payer: Medicare HMO

## 2020-01-11 DIAGNOSIS — I639 Cerebral infarction, unspecified: Secondary | ICD-10-CM | POA: Diagnosis not present

## 2020-01-12 LAB — CUP PACEART REMOTE DEVICE CHECK
Date Time Interrogation Session: 20211011121124
Implantable Pulse Generator Implant Date: 20210907

## 2020-01-12 NOTE — Progress Notes (Signed)
[facility] Summary Report / Loop Recorder 

## 2020-01-13 ENCOUNTER — Inpatient Hospital Stay: Payer: Medicare HMO

## 2020-01-13 ENCOUNTER — Other Ambulatory Visit: Payer: Self-pay

## 2020-01-13 DIAGNOSIS — C3411 Malignant neoplasm of upper lobe, right bronchus or lung: Secondary | ICD-10-CM

## 2020-01-13 DIAGNOSIS — Z5111 Encounter for antineoplastic chemotherapy: Secondary | ICD-10-CM | POA: Diagnosis not present

## 2020-01-13 LAB — CMP (CANCER CENTER ONLY)
ALT: 15 U/L (ref 0–44)
AST: 17 U/L (ref 15–41)
Albumin: 3.5 g/dL (ref 3.5–5.0)
Alkaline Phosphatase: 83 U/L (ref 38–126)
Anion gap: 7 (ref 5–15)
BUN: 25 mg/dL — ABNORMAL HIGH (ref 8–23)
CO2: 25 mmol/L (ref 22–32)
Calcium: 8.9 mg/dL (ref 8.9–10.3)
Chloride: 107 mmol/L (ref 98–111)
Creatinine: 1.19 mg/dL (ref 0.61–1.24)
GFR, Estimated: >60 mL/min (ref >60)
Glucose, Bld: 126 mg/dL — ABNORMAL HIGH (ref 70–99)
Potassium: 4.6 mmol/L (ref 3.5–5.1)
Sodium: 139 mmol/L (ref 135–145)
Total Bilirubin: 0.6 mg/dL (ref 0.3–1.2)
Total Protein: 7 g/dL (ref 6.5–8.1)

## 2020-01-13 LAB — CBC WITH DIFFERENTIAL (CANCER CENTER ONLY)
Abs Immature Granulocytes: 0 10*3/uL (ref 0.00–0.07)
Basophils Absolute: 0 10*3/uL (ref 0.0–0.1)
Basophils Relative: 0 %
Eosinophils Absolute: 0 10*3/uL (ref 0.0–0.5)
Eosinophils Relative: 3 %
HCT: 24.8 % — ABNORMAL LOW (ref 39.0–52.0)
Hemoglobin: 8.2 g/dL — ABNORMAL LOW (ref 13.0–17.0)
Lymphocytes Relative: 37 %
Lymphs Abs: 0.5 10*3/uL — ABNORMAL LOW (ref 0.7–4.0)
MCH: 30.6 pg (ref 26.0–34.0)
MCHC: 33.1 g/dL (ref 30.0–36.0)
MCV: 92.5 fL (ref 80.0–100.0)
Monocytes Absolute: 0.1 10*3/uL (ref 0.1–1.0)
Monocytes Relative: 8 %
Neutro Abs: 0.7 10*3/uL — ABNORMAL LOW (ref 1.7–7.7)
Neutrophils Relative %: 52 %
Platelet Count: 135 10*3/uL — ABNORMAL LOW (ref 150–400)
RBC: 2.68 MIL/uL — ABNORMAL LOW (ref 4.22–5.81)
RDW: 17.5 % — ABNORMAL HIGH (ref 11.5–15.5)
WBC Count: 1.4 10*3/uL — ABNORMAL LOW (ref 4.0–10.5)
nRBC: 0 % (ref 0.0–0.2)

## 2020-01-18 ENCOUNTER — Telehealth: Payer: Self-pay

## 2020-01-18 NOTE — Telephone Encounter (Signed)
ILR implanted for CVA. R-R waves are regular. Reprogrammed device settings to "less sensitive" per protocol.

## 2020-01-20 ENCOUNTER — Other Ambulatory Visit: Payer: Self-pay

## 2020-01-20 ENCOUNTER — Inpatient Hospital Stay: Payer: Medicare HMO

## 2020-01-20 DIAGNOSIS — C3411 Malignant neoplasm of upper lobe, right bronchus or lung: Secondary | ICD-10-CM

## 2020-01-20 DIAGNOSIS — Z5111 Encounter for antineoplastic chemotherapy: Secondary | ICD-10-CM | POA: Diagnosis not present

## 2020-01-20 LAB — TSH: TSH: 0.989 u[IU]/mL (ref 0.320–4.118)

## 2020-01-26 ENCOUNTER — Other Ambulatory Visit: Payer: Self-pay | Admitting: Physician Assistant

## 2020-01-26 DIAGNOSIS — C3411 Malignant neoplasm of upper lobe, right bronchus or lung: Secondary | ICD-10-CM

## 2020-01-27 ENCOUNTER — Other Ambulatory Visit: Payer: Self-pay | Admitting: Medical Oncology

## 2020-01-27 ENCOUNTER — Inpatient Hospital Stay: Payer: Medicare HMO

## 2020-01-27 ENCOUNTER — Encounter: Payer: Self-pay | Admitting: Internal Medicine

## 2020-01-27 ENCOUNTER — Inpatient Hospital Stay (HOSPITAL_BASED_OUTPATIENT_CLINIC_OR_DEPARTMENT_OTHER): Payer: Medicare HMO | Admitting: Internal Medicine

## 2020-01-27 ENCOUNTER — Other Ambulatory Visit: Payer: Self-pay | Admitting: Physician Assistant

## 2020-01-27 ENCOUNTER — Inpatient Hospital Stay: Payer: Medicare HMO | Admitting: Nutrition

## 2020-01-27 VITALS — BP 132/80 | HR 98 | Temp 96.9°F | Resp 17 | Ht 74.0 in | Wt 188.3 lb

## 2020-01-27 DIAGNOSIS — I10 Essential (primary) hypertension: Secondary | ICD-10-CM

## 2020-01-27 DIAGNOSIS — T451X5A Adverse effect of antineoplastic and immunosuppressive drugs, initial encounter: Secondary | ICD-10-CM

## 2020-01-27 DIAGNOSIS — D6481 Anemia due to antineoplastic chemotherapy: Secondary | ICD-10-CM

## 2020-01-27 DIAGNOSIS — Z5112 Encounter for antineoplastic immunotherapy: Secondary | ICD-10-CM

## 2020-01-27 DIAGNOSIS — C3411 Malignant neoplasm of upper lobe, right bronchus or lung: Secondary | ICD-10-CM

## 2020-01-27 DIAGNOSIS — Z5111 Encounter for antineoplastic chemotherapy: Secondary | ICD-10-CM

## 2020-01-27 LAB — CBC WITH DIFFERENTIAL (CANCER CENTER ONLY)
Abs Immature Granulocytes: 0.11 K/uL — ABNORMAL HIGH (ref 0.00–0.07)
Basophils Absolute: 0 K/uL (ref 0.0–0.1)
Basophils Relative: 1 %
Eosinophils Absolute: 0.1 K/uL (ref 0.0–0.5)
Eosinophils Relative: 2 %
HCT: 24.1 % — ABNORMAL LOW (ref 39.0–52.0)
Hemoglobin: 8 g/dL — ABNORMAL LOW (ref 13.0–17.0)
Immature Granulocytes: 4 %
Lymphocytes Relative: 22 %
Lymphs Abs: 0.6 K/uL — ABNORMAL LOW (ref 0.7–4.0)
MCH: 32.5 pg (ref 26.0–34.0)
MCHC: 33.2 g/dL (ref 30.0–36.0)
MCV: 98 fL (ref 80.0–100.0)
Monocytes Absolute: 0.6 K/uL (ref 0.1–1.0)
Monocytes Relative: 20 %
Neutro Abs: 1.5 K/uL — ABNORMAL LOW (ref 1.7–7.7)
Neutrophils Relative %: 51 %
Platelet Count: 195 K/uL (ref 150–400)
RBC: 2.46 MIL/uL — ABNORMAL LOW (ref 4.22–5.81)
RDW: 20.2 % — ABNORMAL HIGH (ref 11.5–15.5)
WBC Count: 2.9 K/uL — ABNORMAL LOW (ref 4.0–10.5)
nRBC: 0 % (ref 0.0–0.2)

## 2020-01-27 LAB — CMP (CANCER CENTER ONLY)
ALT: 15 U/L (ref 0–44)
AST: 19 U/L (ref 15–41)
Albumin: 3.6 g/dL (ref 3.5–5.0)
Alkaline Phosphatase: 84 U/L (ref 38–126)
Anion gap: 11 (ref 5–15)
BUN: 17 mg/dL (ref 8–23)
CO2: 21 mmol/L — ABNORMAL LOW (ref 22–32)
Calcium: 8.8 mg/dL — ABNORMAL LOW (ref 8.9–10.3)
Chloride: 110 mmol/L (ref 98–111)
Creatinine: 1.11 mg/dL (ref 0.61–1.24)
GFR, Estimated: >60 mL/min (ref >60)
Glucose, Bld: 121 mg/dL — ABNORMAL HIGH (ref 70–99)
Potassium: 3.9 mmol/L (ref 3.5–5.1)
Sodium: 142 mmol/L (ref 135–145)
Total Bilirubin: 0.6 mg/dL (ref 0.3–1.2)
Total Protein: 6.8 g/dL (ref 6.5–8.1)

## 2020-01-27 LAB — SAMPLE TO BLOOD BANK

## 2020-01-27 LAB — TSH: TSH: 1.01 u[IU]/mL (ref 0.320–4.118)

## 2020-01-27 MED ORDER — SODIUM CHLORIDE 0.9 % IV SOLN
200.0000 mg | Freq: Once | INTRAVENOUS | Status: AC
  Administered 2020-01-27: 200 mg via INTRAVENOUS
  Filled 2020-01-27: qty 8

## 2020-01-27 MED ORDER — SODIUM CHLORIDE 0.9 % IV SOLN
500.0000 mg/m2 | Freq: Once | INTRAVENOUS | Status: AC
  Administered 2020-01-27: 1100 mg via INTRAVENOUS
  Filled 2020-01-27: qty 40

## 2020-01-27 MED ORDER — SODIUM CHLORIDE 0.9 % IV SOLN
Freq: Once | INTRAVENOUS | Status: AC
  Filled 2020-01-27: qty 250

## 2020-01-27 MED ORDER — PROCHLORPERAZINE MALEATE 10 MG PO TABS
ORAL_TABLET | ORAL | Status: AC
  Filled 2020-01-27: qty 1

## 2020-01-27 MED ORDER — PROCHLORPERAZINE MALEATE 10 MG PO TABS
10.0000 mg | ORAL_TABLET | Freq: Once | ORAL | Status: AC
  Administered 2020-01-27: 10 mg via ORAL

## 2020-01-27 NOTE — Progress Notes (Signed)
[facility] [phone]   [phone]  OFFICE PROGRESS NOTE  Cop[place], Gay Filler, MD [address] [place] [address]  DIAGNOSIS: stage IV (T3, N2, M1a) non-small cell lung cancer, adenocarcinoma presented with large right upper lobe lung mass extending to the right suprahilar region in addition to right hilar and mediastinal lymphadenopathy with obstruction of the associated upper lobe bronchus and bilateral subcentimeter pulmonary nodules diagnosed in July 2021.  Biomarker Findings Microsatellite status - MS-Stable Tumor Mutational Burden - 3 Muts/Mb Genomic Findings For a complete list of the genes assayed, please refer to the Appendix. ERBB2 [id number], amplification - equivocal? HGF amplification - equivocal? MTAP loss exons 5-8 MYC amplification - equivocal? CDKN2A/B CDKN2B loss, CDKN2A loss CUL3 Y54f*19 RPTOR amplification TP53 [id number] Disease relevant genes with no reportable alterations: ALK, BRAF, EGFR, KRAS, MET, RET, ROS1  PDL1 Expression: Negative  PRIOR THERAPY: Palliative radiotherapy to the obstructive lung mass under the care of Dr. MoLisbeth RenshawThe past treatment is scheduled for8/23/21.  CURRENT THERAPY: Systemic chemotherapy with carboplatin for AUC of 5, Alimta 500 mg/M2 and Keytruda 200 mg IV every 3 weeks.  First dose November 04, 2019.  Status post 4 cycles.  Starting from cycle #5 he will be on maintenance treatment with Alimta and Keytruda every 3 weeks.  INTERVAL HISTORY: [place] Baria[age] male returns to the clinic today for follow-up visit.  The patient is feeling fine today with no concerning complaints except for nasal congestion as well as fatigue.  He denied having any chest pain, shortness of breath, cough or hemoptysis.  He denied having any fever or chills.  He has no nausea, vomiting, diarrhea or constipation.  He has no headache or visual changes.  The patient tolerated the  previous cycle of his treatment well.  He is here today for evaluation before starting cycle #5.  MEDICAL HISTORY: Past Medical History:  Diagnosis Date  . Chicken pox   . DM2 (di[place] mellitus, type 2) ([facility]  . History of kidney stones    passed several   . Hypertension   . Kidney stones    5-6 times  . Measles   . Mumps   . [place]   [place] [facility], last one was 09/18/2019- has some expressive aphasia    ALLERGIES:  is allergic to bee venom.  MEDICATIONS:  Current Outpatient Medications  Medication Sig Dispense Refill  . acetaminophen (TYLENOL) 500 MG tablet Take 500 mg by mouth every 6 (six) hours as needed.     . Marland Kitchentorvastatin (LIPITOR) 40 MG tablet Take 1 tablet (40 mg total) by mouth daily at 6 PM. 90 tablet 3  . clopidogrel (PLAVIX) 75 MG tablet Take 1 tablet (75 mg total) by mouth daily. Okay to restart this medication on 10/01/2019. 90 tablet 3  . folic acid (FOLVITE) 1 MG tablet Take 1 tablet (1 mg total) by mouth daily. 30 tablet 4  . gabapentin (NEURONTIN) 300 MG capsule Take 1 capsule (300 mg total) by mouth at bedtime. 90 capsule 3  . HYDROcodone-homatropine (HYCODAN) 5-1.5 MG/5ML syrup Take 5 mLs by mouth every 6 (six) hours as needed for cough. 120 mL 0  . metFORMIN (GLUCOPHAGE) 500 MG tablet TAKE TWO TABLETS BY MOUTH TWICE A DAY WITH MEAL(S) 360 tablet 3  . Omega-3 Fatty Acids (FISH OIL) 1200 MG CAPS Take by mouth daily.    . prochlorperazine (COMPAZINE) 10 MG tablet Take 10 mg  by mouth every 6 (six) hours as needed.     . sitaGLIPtin (JANUVIA) 100 MG tablet Take 1 tablet (100 mg total) by mouth daily. 30 tablet 6   No current facility-administered medications for this visit.    SURGICAL HISTORY:  Past Surgical History:  Procedure Laterality Date  . COLONOSCOPY W/ POLYPECTOMY    . VIDEO BRONCHOSCOPY WITH ENDOBRONCHIAL ULTRASOUND N/A 09/30/2019   Procedure: VIDEO BRONCHOSCOPY WITH ENDOBRONCHIAL ULTRASOUND;  Surgeon: Collene Gobble, MD;  Location: [facility];  Service:  Thoracic;  Laterality: N/A;  . WISDOM TOOTH EXTRACTION  1976-77    REVIEW OF SYSTEMS:  Constitutional: positive for fatigue Eyes: negative Ears, nose, mouth, throat, and face: positive for nasal congestion Respiratory: positive for cough Cardiovascular: negative Gastrointestinal: negative Genitourinary:negative Integument/breast: negative Hematologic/lymphatic: negative Musculoskeletal:negative Neurological: negative Behavioral/Psych: negative Endocrine: negative Allergic/Immunologic: negative   PHYSICAL EXAMINATION: General appearance: alert, cooperative, fatigued and no distress Head: Normocephalic, without obvious abnormality, atraumatic Neck: no adenopathy, no JVD, supple, symmetrical, trachea midline and thyroid not enlarged, symmetric, no tenderness/mass/nodules Lymph nodes: Cervical, supraclavicular, and axillary nodes normal. Resp: clear to auscultation bilaterally Back: symmetric, no curvature. ROM normal. No CVA tenderness. Cardio: regular rate and rhythm, S1, S2 normal, no murmur, click, rub or gallop GI: soft, non-tender; bowel sounds normal; no masses,  no organomegaly Extremities: extremities normal, atraumatic, no cyanosis or edema Neurologic: Alert and oriented X 3, normal strength and tone. Normal symmetric reflexes. Normal coordination and gait  ECOG PERFORMANCE STATUS: 1 - Symptomatic but completely ambulatory  Blood pressure 132/80, pulse 98, temperature (!) 96.9 F (36.1 C), temperature source Tympanic, resp. rate 17, height _0  (1.88 m), weight 188 lb 4.8 oz (85.4 kg), SpO2 100 %.  LABORATORY DATA: Lab Results  Component Value Date   WBC 1.4 (L) 01/13/2020   HGB 8.2 (L) 01/13/2020   HCT 24.8 (L) 01/13/2020   MCV 92.5 01/13/2020   PLT 135 (L) 01/13/2020      Chemistry      Component Value Date/Time   NA 139 01/13/2020 0903   K 4.6 01/13/2020 0903   CL 107 01/13/2020 0903   CO2 25 01/13/2020 0903   BUN 25 (H) 01/13/2020 0903   CREATININE 1.19  01/13/2020 0903   CREATININE 1.13 10/16/2013 1118      Component Value Date/Time   CALCIUM 8.9 01/13/2020 0903   ALKPHOS 83 01/13/2020 0903   AST 17 01/13/2020 0903   ALT 15 01/13/2020 0903   BILITOT 0.6 01/13/2020 0903       RADIOGRAPHIC STUDIES: CT Chest W Contrast  Result Date: 01/04/2020 CLINICAL DATA:  Non-small cell right upper lobe lung cancer diagnosed in July with ongoing chemotherapy, completed radiation therapy. Restaging. EXAM: CT CHEST, ABDOMEN, AND PELVIS WITH CONTRAST TECHNIQUE: Multidetector CT imaging of the chest, abdomen and pelvis was performed following the standard protocol during bolus administration of intravenous contrast. CONTRAST:  181m OMNIPAQUE IOHEXOL 300 MG/ML  SOLN COMPARISON:  10/26/2019 PET-CT.  09/19/2019 chest CT. FINDINGS: CT CHEST FINDINGS Cardiovascular: Normal heart size. No significant pericardial effusion/thickening. Normal course and caliber of the thoracic aorta. Top-normal caliber main pulmonary artery (3.0 cm diameter). No central pulmonary emboli. Mediastinum/Nodes: No discrete thyroid nodules. Unremarkable esophagus. No axillary adenopathy. Mildly enlarged 1.0 cm lower right paratracheal node (series 2/image 22), mildly decreased from 1.3 cm. Subcarinal 0.7 cm node (series 2/image 20) is decreased from 1.7 cm. No new pathologically enlarged mediastinal nodes. No pathologically enlarged hilar nodes. Lungs/Pleura: No pneumothorax. No pleural effusion. Central right  upper lobe 4.5 x 3.2 cm lung mass (series 6/image 34) with associated volume loss, decreased from 6.0 x 3.9 cm. Calcified subcentimeter peripheral left lower lobe granuloma is stable. Numerous previously visualized scattered solid pulmonary nodules in both lungs, largest 5 mm in the posterior right lower lobe (series 6/image 91), not appreciably changed. Minimal new tree-in-bud type opacity in the anterior left upper lobe (series 6/image 62). Musculoskeletal: No aggressive appearing focal  osseous lesions. Mild thoracic spondylosis. Subcutaneous loop recorder in the ventral left chest wall. CT ABDOMEN PELVIS FINDINGS Hepatobiliary: Normal liver with no liver mass. Normal gallbladder with no radiopaque cholelithiasis. No biliary ductal dilatation. Pancreas: Normal, with no mass or duct dilation. Spleen: Normal size. No mass. Adrenals/Urinary Tract: Normal adrenals. Normal kidneys with no hydronephrosis and no renal mass. Dependent 11 mm right bladder stone. No bladder wall thickening. Stomach/Bowel: Normal non-distended stomach. Normal caliber small bowel with no small bowel wall thickening. Normal appendix. Oral contrast transits to the colon. Marked diffuse colonic diverticulosis, most prominent in the sigmoid colon, with no acute large bowel wall thickening or significant pericolonic fat stranding. Vascular/Lymphatic: Atherosclerotic nonaneurysmal abdominal aorta. Patent portal, splenic, hepatic and renal veins. No pathologically enlarged lymph nodes in the abdomen or pelvis. Reproductive: Normal size prostate. Other: No pneumoperitoneum, ascites or focal fluid collection. Musculoskeletal: No aggressive appearing focal osseous lesions. Moderate lumbar spondylosis. IMPRESSION: 1. Central right upper lobe lung mass is decreased in size. 2. Mild mediastinal lymphadenopathy is mildly decreased. 3. Subcentimeter bilateral pulmonary nodules are stable. Minimal new tree-in-bud type opacity in the anterior left upper lobe, favoring minimal nonspecific bronchiolitis. 4. No findings suspicious for new or progressive metastatic disease in the chest. No evidence of metastatic disease in the abdomen or pelvis. 5. Chronic findings include: Marked diffuse colonic diverticulosis. 11 mm bladder stone. Aortic Atherosclerosis (ICD10-I70.0). Electronically Signed   By: Ilona Sorrel M.D.   On: 01/04/2020 09:23   CT Abdomen Pelvis W Contrast  Result Date: 01/04/2020 CLINICAL DATA:  Non-small cell right upper lobe  lung cancer diagnosed in July with ongoing chemotherapy, completed radiation therapy. Restaging. EXAM: CT CHEST, ABDOMEN, AND PELVIS WITH CONTRAST TECHNIQUE: Multidetector CT imaging of the chest, abdomen and pelvis was performed following the standard protocol during bolus administration of intravenous contrast. CONTRAST:  159m OMNIPAQUE IOHEXOL 300 MG/ML  SOLN COMPARISON:  10/26/2019 PET-CT.  09/19/2019 chest CT. FINDINGS: CT CHEST FINDINGS Cardiovascular: Normal heart size. No significant pericardial effusion/thickening. Normal course and caliber of the thoracic aorta. Top-normal caliber main pulmonary artery (3.0 cm diameter). No central pulmonary emboli. Mediastinum/Nodes: No discrete thyroid nodules. Unremarkable esophagus. No axillary adenopathy. Mildly enlarged 1.0 cm lower right paratracheal node (series 2/image 22), mildly decreased from 1.3 cm. Subcarinal 0.7 cm node (series 2/image 20) is decreased from 1.7 cm. No new pathologically enlarged mediastinal nodes. No pathologically enlarged hilar nodes. Lungs/Pleura: No pneumothorax. No pleural effusion. Central right upper lobe 4.5 x 3.2 cm lung mass (series 6/image 34) with associated volume loss, decreased from 6.0 x 3.9 cm. Calcified subcentimeter peripheral left lower lobe granuloma is stable. Numerous previously visualized scattered solid pulmonary nodules in both lungs, largest 5 mm in the posterior right lower lobe (series 6/image 91), not appreciably changed. Minimal new tree-in-bud type opacity in the anterior left upper lobe (series 6/image 62). Musculoskeletal: No aggressive appearing focal osseous lesions. Mild thoracic spondylosis. Subcutaneous loop recorder in the ventral left chest wall. CT ABDOMEN PELVIS FINDINGS Hepatobiliary: Normal liver with no liver mass. Normal gallbladder with no radiopaque cholelithiasis.  No biliary ductal dilatation. Pancreas: Normal, with no mass or duct dilation. Spleen: Normal size. No mass. Adrenals/Urinary  Tract: Normal adrenals. Normal kidneys with no hydronephrosis and no renal mass. Dependent 11 mm right bladder stone. No bladder wall thickening. Stomach/Bowel: Normal non-distended stomach. Normal caliber small bowel with no small bowel wall thickening. Normal appendix. Oral contrast transits to the colon. Marked diffuse colonic diverticulosis, most prominent in the sigmoid colon, with no acute large bowel wall thickening or significant pericolonic fat stranding. Vascular/Lymphatic: Atherosclerotic nonaneurysmal abdominal aorta. Patent portal, splenic, hepatic and renal veins. No pathologically enlarged lymph nodes in the abdomen or pelvis. Reproductive: Normal size prostate. Other: No pneumoperitoneum, ascites or focal fluid collection. Musculoskeletal: No aggressive appearing focal osseous lesions. Moderate lumbar spondylosis. IMPRESSION: 1. Central right upper lobe lung mass is decreased in size. 2. Mild mediastinal lymphadenopathy is mildly decreased. 3. Subcentimeter bilateral pulmonary nodules are stable. Minimal new tree-in-bud type opacity in the anterior left upper lobe, favoring minimal nonspecific bronchiolitis. 4. No findings suspicious for new or progressive metastatic disease in the chest. No evidence of metastatic disease in the abdomen or pelvis. 5. Chronic findings include: Marked diffuse colonic diverticulosis. 11 mm bladder stone. Aortic Atherosclerosis (ICD10-I70.0). Electronically Signed   By: Ilona Sorrel M.D.   On: 01/04/2020 09:23   CUP PACEART REMOTE DEVICE CHECK  Result Date: 01/12/2020 ILR summary report received. Battery status OK. Normal device function. No new symptom episodes, tachy episodes, brady, or pause episodes. No new AF episodes. 2 "AF" episodes stored and previously reviewed as alerts, felt to be sinus with ectopics (see previous encounter summaries for details). No true AF noted to date. Monthly summary reports and ROV/PRN   ASSESSMENT AND PLAN: This is a very  pleasant 69 years old white male recently diagnosed with a stage IV (T3, N2, M1 a) non-small cell lung cancer, adenocarcinoma presented with large right upper lobe lung mass with extension to the right suprahilar region as well as right hilar and mediastinal lymphadenopathy with obstruction of the right upper lobe and bilateral pulmonary nodules diagnosed in July 2021. The molecular study shows no actionable mutations and PD-L1 expression is negative. He had a short course of palliative radiotherapy to the obstructive lung mass under the care of Dr. Lisbeth Renshaw. The patient is currently undergoing systemic chemotherapy with carboplatin for AUC of 5, Alimta 500 mg/M2 and Keytruda 200 mg IV every 3 weeks status post 4 cycles.  Starting from cycle #5 he will be on maintenance treatment with Alimta and Keytruda every 3 weeks The patient has been tolerating this treatment fairly well with no concerning adverse effects. I recommended for him to proceed with cycle #5 today as planned. For the chemotherapy-induced anemia, I will arrange for the patient to receive 2 units of PRBCs transfusion this week. He will come back for follow-up visit in 3 weeks for evaluation before the next cycle of his treatment. For the thyroid malignancy, he was referred to Dr. Harlow Asa for evaluation and surgical intervention. The patient was advised to call immediately if he has any concerning symptoms. The patient voices understanding of current disease status and treatment options and is in agreement with the current care plan.  All questions were answered. The patient knows to call the clinic with any problems, questions or concerns. We can certainly see the patient much sooner if necessary.  Disclaimer: This note was dictated with voice recognition software. Similar sounding words can inadvertently be transcribed and may not be corrected upon review.

## 2020-01-27 NOTE — Patient Instructions (Signed)
[facility] Discharge Instructions for Patients Receiving Chemotherapy  Today you received the following chemotherapy agents Pembrolizumab (KEYTRUDA) & Pemetrexed (ALIMTA).  To help prevent nausea and vomiting after your treatment, we encourage you to take your nausea medication as prescribed.   If you develop nausea and vomiting that is not controlled by your nausea medication, call the clinic.   BELOW ARE SYMPTOMS THAT SHOULD BE REPORTED IMMEDIATELY:  *FEVER GREATER THAN 100.5 F  *CHILLS WITH OR WITHOUT FEVER  NAUSEA AND VOMITING THAT IS NOT CONTROLLED WITH YOUR NAUSEA MEDICATION  *UNUSUAL SHORTNESS OF BREATH  *UNUSUAL BRUISING OR BLEEDING  TENDERNESS IN MOUTH AND THROAT WITH OR WITHOUT PRESENCE OF ULCERS  *URINARY PROBLEMS  *BOWEL PROBLEMS  UNUSUAL RASH Items with * indicate a potential emergency and should be followed up as soon as possible.  Feel free to call the clinic should you have any questions or concerns. The clinic phone number is [phone].  Please show the [facility] at check-in to the Emergency Department and triage nurse.

## 2020-01-27 NOTE — Progress Notes (Signed)
Nutrition follow up completed with patient receiving treatment for lung cancer. Wt decreased and documented as 188.3 pounds from 190.8 pounds on October 6. No nausea or vomiting. Reports decreased appetite continues but is feeling like he is eating better. Continues to cough.  Nutrition Diagnosis: Unintended wt loss continues.  Intervention: Educated to continue high calorie, high protein meals and snacks. Monitor nausea and take medications as needed. Questions answered and teach back method used. Patient has my contact information.  Monitoring, Evaluation, Goals: Patient will tolerate adequate calories and protein to minimize wt loss.  Next Visit: To be scheduled as needed. Patient will contact me with questions.

## 2020-01-28 ENCOUNTER — Inpatient Hospital Stay: Payer: Medicare HMO

## 2020-01-28 ENCOUNTER — Other Ambulatory Visit: Payer: Self-pay | Admitting: Physician Assistant

## 2020-01-28 ENCOUNTER — Other Ambulatory Visit: Payer: Self-pay

## 2020-01-28 DIAGNOSIS — D649 Anemia, unspecified: Secondary | ICD-10-CM

## 2020-01-28 DIAGNOSIS — D6481 Anemia due to antineoplastic chemotherapy: Secondary | ICD-10-CM

## 2020-01-28 DIAGNOSIS — Z5111 Encounter for antineoplastic chemotherapy: Secondary | ICD-10-CM | POA: Diagnosis not present

## 2020-01-28 LAB — PREPARE RBC (CROSSMATCH)

## 2020-01-28 LAB — ABO/RH: ABO/RH(D): B POS

## 2020-01-28 MED ORDER — DIPHENHYDRAMINE HCL 25 MG PO CAPS
ORAL_CAPSULE | ORAL | Status: AC
  Filled 2020-01-28: qty 1

## 2020-01-28 MED ORDER — SODIUM CHLORIDE 0.9 % IV SOLN
Freq: Once | INTRAVENOUS | Status: AC
  Filled 2020-01-28: qty 250

## 2020-01-28 MED ORDER — ACETAMINOPHEN 325 MG PO TABS
650.0000 mg | ORAL_TABLET | Freq: Once | ORAL | Status: AC
  Administered 2020-01-28: 650 mg via ORAL

## 2020-01-28 MED ORDER — DIPHENHYDRAMINE HCL 25 MG PO CAPS
25.0000 mg | ORAL_CAPSULE | Freq: Once | ORAL | Status: AC
  Administered 2020-01-28: 25 mg via ORAL

## 2020-01-28 MED ORDER — ACETAMINOPHEN 325 MG PO TABS
ORAL_TABLET | ORAL | Status: AC
  Filled 2020-01-28: qty 2

## 2020-01-28 NOTE — Progress Notes (Signed)
Pt received 2 units PRBCs today, tolerated well.  Able to eat/drink and ambulate to restroom as needed.

## 2020-01-28 NOTE — Patient Instructions (Signed)
Blood Transfusion, Adult, Care After This sheet gives you information about how to care for yourself after your procedure. Your doctor may also give you more specific instructions. If you have problems or questions, contact your doctor. What can I expect after the procedure? After the procedure, it is common to have:  Bruising and soreness at the IV site.  A fever or chills on the day of the procedure. This may be your body's response to the new blood cells received.  A headache. Follow these instructions at home: Insertion site care      Follow instructions from your doctor about how to take care of your insertion site. This is where an IV tube was put into your vein. Make sure you: ? Wash your hands with soap and water before and after you change your bandage (dressing). If you cannot use soap and water, use hand sanitizer. ? Change your bandage as told by your doctor.  Check your insertion site every day for signs of infection. Check for: ? Redness, swelling, or pain. ? Bleeding from the site. ? Warmth. ? Pus or a bad smell. General instructions  Take over-the-counter and prescription medicines only as told by your doctor.  Rest as told by your doctor.  Go back to your normal activities as told by your doctor.  Keep all follow-up visits as told by your doctor. This is important. Contact a doctor if:  You have itching or red, swollen areas of skin (hives).  You feel worried or nervous (anxious).  You feel weak after doing your normal activities.  You have redness, swelling, warmth, or pain around the insertion site.  You have blood coming from the insertion site, and the blood does not stop with pressure.  You have pus or a bad smell coming from the insertion site. Get help right away if:  You have signs of a serious reaction. This may be coming from an allergy or the body's defense system (immune system). Signs include: ? Trouble breathing or shortness of  breath. ? Swelling of the face or feeling warm (flushed). ? Fever or chills. ? Head, chest, or back pain. ? Dark pee (urine) or blood in the pee. ? Widespread rash. ? Fast heartbeat. ? Feeling dizzy or light-headed. You may receive your blood transfusion in an outpatient setting. If so, you will be told whom to contact to report any reactions. These symptoms may be an emergency. Do not wait to see if the symptoms will go away. Get medical help right away. Call your local emergency services (911 in the [place] Do not drive yourself to the hospital. Summary  Bruising and soreness at the IV site are common.  Check your insertion site every day for signs of infection.  Rest as told by your doctor. Go back to your normal activities as told by your doctor.  Get help right away if you have signs of a serious reaction. This information is not intended to replace advice given to you by your health care provider. Make sure you discuss any questions you have with your health care provider. Document Revised: 09/11/2018 Document Reviewed: 09/11/2018 Elsevier Patient Education  [place].      Blood Transfusion, Adult A blood transfusion is a procedure in which you receive blood or a type of blood cell (blood component) through an IV. You may need a blood transfusion when your blood level is low. This may result from a bleeding disorder, illness, injury, or surgery. The blood may  come from a donor. You may also be able to donate blood for yourself (autologous blood donation) before a planned surgery. The blood given in a transfusion is made up of different blood components. You may receive:  Red blood cells. These carry oxygen to the cells in the body.  Platelets. These help your blood to clot.  Plasma. This is the liquid part of your blood. It carries proteins and other substances throughout the body.  White blood cells. These help you fight infections. If you have hemophilia or  another clotting disorder, you may also receive other types of blood products. Tell a health care provider about:  Any blood disorders you have.  Any previous reactions you have had during a blood transfusion.  Any allergies you have.  All medicines you are taking, including vitamins, herbs, eye drops, creams, and over-the-counter medicines.  Any surgeries you have had.  Any medical conditions you have, including any recent fever or cold symptoms.  Whether you are pregnant or may be pregnant. What are the risks? Generally, this is a safe procedure. However, problems may occur.  The most common problems include: ? A mild allergic reaction, such as red, swollen areas of skin (hives) and itching. ? Fever or chills. This may be the body's response to new blood cells received. This may occur during or up to 4 hours after the transfusion.  More serious problems may include: ? Transfusion-associated circulatory overload (TACO), or too much fluid in the lungs. This may cause breathing problems. ? A serious allergic reaction, such as difficulty breathing or swelling around the face and lips. ? Transfusion-related acute lung injury (TRALI), which causes breathing difficulty and low oxygen in the blood. This can occur within hours of the transfusion or several days later. ? Iron overload. This can happen after receiving many blood transfusions over a period of time. ? Infection or virus being transmitted. This is rare because donated blood is carefully tested before it is given. ? Hemolytic transfusion reaction. This is rare. It happens when your body's defense system (immune system)tries to attack the new blood cells. Symptoms may include fever, chills, nausea, low blood pressure, and low back or chest pain. ? Transfusion-associated graft-versus-host disease (TAGVHD). This is rare. It happens when donated cells attack your body's healthy tissues. What happens before the  procedure? Medicines Ask your health care provider about:  Changing or stopping your regular medicines. This is especially important if you are taking diabetes medicines or blood thinners.  Taking medicines such as aspirin and ibuprofen. These medicines can thin your blood. Do not take these medicines unless your health care provider tells you to take them.  Taking over-the-counter medicines, vitamins, herbs, and supplements. General instructions  Follow instructions from your health care provider about eating and drinking restrictions.  You will have a blood test to determine your blood type. This is necessary to know what kind of blood your body will accept and to match it to the donor blood.  If you are going to have a planned surgery, you may be able to do an autologous blood donation. This may be done in case you need to have a transfusion.  You will have your temperature, blood pressure, and pulse monitored before the transfusion.  If you have had an allergic reaction to a transfusion in the past, you may be given medicine to help prevent a reaction. This medicine may be given to you by mouth (orally) or through an IV.  Set aside time  for the blood transfusion. This procedure generally takes 1-4 hours to complete. What happens during the procedure?   An IV will be inserted into one of your veins.  The bag of donated blood will be attached to your IV. The blood will then enter through your vein.  Your temperature, blood pressure, and pulse will be monitored regularly during the transfusion. This monitoring is done to detect early signs of a transfusion reaction.  Tell your nurse right away if you have any of these symptoms during the transfusion: ? Shortness of breath or trouble breathing. ? Chest or back pain. ? Fever or chills. ? Hives or itching.  If you have any signs or symptoms of a reaction, your transfusion will be stopped and you may be given medicine.  When the  transfusion is complete, your IV will be removed.  Pressure may be applied to the IV site for a few minutes.  A bandage (dressing)will be applied. The procedure may vary among health care providers and hospitals. What happens after the procedure?  Your temperature, blood pressure, pulse, breathing rate, and blood oxygen level will be monitored until you leave the hospital or clinic.  Your blood may be tested to see how you are responding to the transfusion.  You may be warmed with fluids or blankets to maintain a normal body temperature.  If you receive your blood transfusion in an outpatient setting, you will be told whom to contact to report any reactions. Where to find more information For more information on blood transfusions, visit the American Red [employer]: redcross.org Summary  A blood transfusion is a procedure in which you receive blood or a type of blood cell (blood component) through an IV.  The blood you receive may come from a donor or be donated by yourself (autologous blood donation) before a planned surgery.  The blood given in a transfusion is made up of different blood components. You may receive red blood cells, platelets, plasma, or white blood cells depending on the condition treated.  Your temperature, blood pressure, and pulse will be monitored before, during, and after the transfusion.  After the transfusion, your blood may be tested to see how your body has responded. This information is not intended to replace advice given to you by your health care provider. Make sure you discuss any questions you have with your health care provider. Document Revised: 09/11/2018 Document Reviewed: 09/11/2018 Elsevier Patient Education  [place].

## 2020-01-29 LAB — BPAM RBC
Blood Product Expiration Date: 202111162359
Blood Product Expiration Date: 202111192359
ISSUE DATE / TIME: 202110280922
ISSUE DATE / TIME: 202110280922
Unit Type and Rh: 7300
Unit Type and Rh: 7300

## 2020-01-29 LAB — TYPE AND SCREEN
ABO/RH(D): B POS
Antibody Screen: NEGATIVE
Unit division: 0
Unit division: 0

## 2020-02-15 ENCOUNTER — Ambulatory Visit (INDEPENDENT_AMBULATORY_CARE_PROVIDER_SITE_OTHER): Payer: Medicare HMO

## 2020-02-15 DIAGNOSIS — I639 Cerebral infarction, unspecified: Secondary | ICD-10-CM

## 2020-02-15 LAB — CUP PACEART REMOTE DEVICE CHECK
Date Time Interrogation Session: 20211113120847
Implantable Pulse Generator Implant Date: 20210907

## 2020-02-15 NOTE — Progress Notes (Signed)
[facility] OFFICE PROGRESS NOTE  Micheal Oliver, Micheal Filler, MD [address] [place] [address]  DIAGNOSIS: Stage IV(T3, N2, M1a) non-small cell lung cancer, adenocarcinoma presented with large right upper lobe lung mass extending to the right suprahilar region in addition to right hilar and mediastinal lymphadenopathy with obstruction of the associated upper lobe bronchusandbilateral subcentimeter pulmonary nodulesdiagnosed in July 2021.  Biomarker Findings Microsatellite status - MS-Stable Tumor Mutational Burden - 3 Muts/Mb Genomic Findings For a complete list of the genes assayed, please refer to the Appendix. ERBB2 [id number], amplification - equivocal? HGF amplification - equivocal? MTAP loss exons 5-8 MYC amplification - equivocal? CDKN2A/B CDKN2B loss, CDKN2A loss CUL3 Y79f*19 RPTOR amplification TP53 [id number] 7 Disease relevant genes with no reportable alterations: ALK, BRAF, EGFR, KRAS, MET, RET, ROS1  PDL1 Expression: Negative  PRIOR THERAPY: Palliative radiotherapy to the obstructive lung mass under the care of Dr. MoLisbeth RenshawThe past treatment is scheduled for8/23/21  CURRENT THERAPY: Systemic chemotherapy with carboplatin for AUC of 5, Alimta 500 mg/M2 and Keytruda 200 mg IV every 3 weeks. First dose November 04, 2019.Status post 5 cycles. Starting from cycle #5 the patient has been on maintenance Alimta and Keytruda IV every 3 weeks.   INTERVAL HISTORY: Micheal [name]89.o. male returns to the clinic today for a follow-up visit.  The patient is feeling well today without any concerning complaints.  In the interval since his last appointment, the patient had received 2 units of blood secondary to chemotherapy-induced anemia.  The patient states his fatigue is significantly improved at this time. The patient denies any fever, chills, or night sweats.  The patient has been following with a member the nutritionist team for his  decreased appetite.  The patient states his appetite is improved at this time. He has gained about 4 lbs since his last appointment. The patient denies any chest pain, shortness of breath, cough, or hemoptysis. He states he has a minimal cough and has not needed to take hycodan in 2 weeks or so.  He denies any nausea, vomiting, or diarrhea. The patient's constipation has resolved as well. The patient denies any headaches. He reports worsening vision with reading and made an appointment with his eye doctor in January. He denies any rashes or skin changes.  The patient recently had an appointment with his dermatologist who removed a benign skin lesion from his L ear.  The patient is here today for evaluation and repeat blood work before starting cycle #6.  MEDICAL HISTORY: Past Medical History:  Diagnosis Date  . Chicken pox   . DM2 (di[place], type 2) ([facility]  . History of kidney stones    passed several   . Hypertension   . Kidney stones    5-6 times  . Measles   . Mumps   . Stroke ([facility]   x 2, last one was 09/18/2019- has some expressive aphasia    ALLERGIES:  is allergic to bee venom.  MEDICATIONS:  Current Outpatient Medications  Medication Sig Dispense Refill  . acetaminophen (TYLENOL) 500 MG tablet Take 500 mg by mouth every 6 (six) hours as needed.     . Marland Kitchentorvastatin (LIPITOR) 40 MG tablet Take 1 tablet (40 mg total) by mouth daily at 6 PM. 90 tablet 3  . clopidogrel (PLAVIX) 75 MG tablet Take 1 tablet (75 mg total) by mouth daily. Okay to restart this medication on 10/01/2019. 90 tablet 3  . folic acid (FOLVITE) 1  MG tablet Take 1 tablet (1 mg total) by mouth daily. 30 tablet 4  . gabapentin (NEURONTIN) 300 MG capsule Take 1 capsule (300 mg total) by mouth at bedtime. 90 capsule 3  . HYDROcodone-homatropine (HYCODAN) 5-1.5 MG/5ML syrup Take 5 mLs by mouth every 6 (six) hours as needed for cough. 120 mL 0  . metFORMIN (GLUCOPHAGE) 500 MG tablet TAKE TWO TABLETS BY MOUTH TWICE  A DAY WITH MEAL(S) 360 tablet 3  . Omega-3 Fatty Acids (FISH OIL) 1200 MG CAPS Take by mouth daily.    . prochlorperazine (COMPAZINE) 10 MG tablet Take 10 mg by mouth every 6 (six) hours as needed.     . sitaGLIPtin (JANUVIA) 100 MG tablet Take 1 tablet (100 mg total) by mouth daily. 30 tablet 6  . triamcinolone cream (KENALOG) 0.1 % Apply topically daily.     No current facility-administered medications for this visit.    SURGICAL HISTORY:  Past Surgical History:  Procedure Laterality Date  . COLONOSCOPY W/ POLYPECTOMY    . VIDEO BRONCHOSCOPY WITH ENDOBRONCHIAL ULTRASOUND N/A 09/30/2019   Procedure: VIDEO BRONCHOSCOPY WITH ENDOBRONCHIAL ULTRASOUND;  Surgeon: Byrum, Robert S, MD;  Location: [facility] OR;  Service: Thoracic;  Laterality: N/A;  . WISDOM TOOTH EXTRACTION  1976-77    REVIEW OF SYSTEMS:   Review of Systems  Constitutional: Negative for appetite change, chills, fatigue (improved), fever and unexpected weight change.  HENT: Negative for mouth sores, nosebleeds, sore throat and trouble swallowing.   Eyes: Negative for eye problems and icterus.  Respiratory: Negative for cough (improved), hemoptysis, shortness of breath and wheezing.   Cardiovascular: Negative for chest pain and leg swelling.  Gastrointestinal: Negative for abdominal pain, constipation (improved), diarrhea, nausea and vomiting.  Genitourinary: Negative for bladder incontinence, difficulty urinating, dysuria, frequency and hematuria.   Musculoskeletal: Negative for back pain, gait problem, neck pain and neck stiffness.  Skin: Negative for itching and rash.  Neurological: Negative for dizziness, extremity weakness, gait problem, headaches, light-headedness and seizures.  Hematological: Negative for adenopathy. Does not bruise/bleed easily.  Psychiatric/Behavioral: Negative for confusion, depression and sleep disturbance. The patient is not nervous/anxious.     PHYSICAL EXAMINATION:  Blood pressure 136/85, pulse 96,  temperature 98.8 F (37.1 C), temperature source Tympanic, resp. rate 17, height 6' 2" (1.88 m), weight 192 lb (87.1 kg), SpO2 100 %.  ECOG PERFORMANCE STATUS: 1 - Symptomatic but completely ambulatory  Physical Exam  Constitutional: Oriented to person, place, and time and well-developed, well-nourished, and in no distress. HENT:  Head: Normocephalic and atraumatic.  Mouth/Throat: Oropharynx is clear and moist. No oropharyngeal exudate.  Eyes: Conjunctivae are normal. Right eye exhibits no discharge. Left eye exhibits no discharge. No scleral icterus.  Neck: Normal range of motion. Neck supple.  Cardiovascular: Normal rate, regular rhythm, normal heart sounds and intact distal pulses.   Pulmonary/Chest: Effort normal and breath sounds normal. No respiratory distress. No wheezes. No rales.  Abdominal: Soft. Bowel sounds are normal. Exhibits no distension and no mass. There is no tenderness.  Musculoskeletal: Normal range of motion. Exhibits no edema.  Lymphadenopathy:    No cervical adenopathy.  Neurological: Alert and oriented to person, place, and time. Exhibits normal muscle tone. Gait normal. Coordination normal.  Skin: Skin is warm and dry. No rash noted. Not diaphoretic. No erythema. No pallor.  Psychiatric: Mood, memory and judgment normal.  Vitals reviewed.  LABORATORY DATA: Lab Results  Component Value Date   WBC 6.4 02/17/2020   HGB 9.3 (L) 02/17/2020     HCT 28.6 (L) 02/17/2020   MCV 96.3 02/17/2020   PLT 234 02/17/2020      Chemistry      Component Value Date/Time   NA 142 01/27/2020 0912   K 3.9 01/27/2020 0912   CL 110 01/27/2020 0912   CO2 21 (L) 01/27/2020 0912   BUN 17 01/27/2020 0912   CREATININE 1.11 01/27/2020 0912   CREATININE 1.13 10/16/2013 1118      Component Value Date/Time   CALCIUM 8.8 (L) 01/27/2020 0912   ALKPHOS 84 01/27/2020 0912   AST 19 01/27/2020 0912   ALT 15 01/27/2020 0912   BILITOT 0.6 01/27/2020 0912       RADIOGRAPHIC  STUDIES:  CUP PACEART REMOTE DEVICE CHECK  Result Date: 02/15/2020 ILR summary report received. Battery status OK. Normal device function. 3 AF events appears true a fib, variable R-R intervals noted, longest duration 2 minutes.  No new symptom, tachy, brady, or pause episodes. Monthly summary reports and ROV/PRN.  Routed to triage to eval a fib events.  R. Powers, LPN, CVRS Reviewed with Dr. Lambert, episodes appears SR. Continue to monitor. LW    ASSESSMENT/PLAN:  This is a very pleasant 69 year oldCaucasianmale recently diagnosed with a stage IV (T3, N2, M1 a) non-small cell lung cancer, adenocarcinoma presented with large right upper lobe lung mass with extension to the right suprahilar region as well as right hilar and mediastinal lymphadenopathy with obstruction of the right upper lobe and bilateral pulmonary nodules diagnosed in July 2021. Negative PDL1 expression. He has no actionable mutations.   The patientcompletedpalliative radiotherapy to the obstructive lung mass under the care of Dr. Moody. The last treatmentwasscheduled for 11/23/19  He is currently undergoing palliative systemic chemotherapy with carboplatin for an AUC of 5, Alimta 500 mg/m2, and Keytruda 200 mg IV every 3 weeks. He is status post 5 cycles and tolerated itwell except for fatigue and decreased appetite.  Starting from cycle #5, the patient has been on maintenance Alimta 500 mg per metered squared and Keytruda 200 mg IV every 3 weeks.  Labs were reviewed.  Recommend that he proceed with cycle #6 today scheduled.  I will arrange for the patient to have a restaging CT scan of the chest, abdomen, and pelvis prior to starting his next cycle of chemotherapy.  We will see him back for follow-up visit in 3 weeks for evaluation and to review his scan results before starting cycle #7.  The patient was advised to call immediately if he has any concerning symptoms in the interval. The patient voices understanding  of current disease status and treatment options and is in agreement with the current care plan. All questions were answered. The patient knows to call the clinic with any problems, questions or concerns. We can certainly see the patient much sooner if necessary        Orders Placed This Encounter  Procedures  . CT Chest W Contrast    Standing Status:   Future    Standing Expiration Date:   02/16/2021    Order Specific Question:   If indicated for the ordered procedure, I authorize the administration of contrast media per Radiology protocol    Answer:   Yes    Order Specific Question:   Preferred imaging location?    Answer:   [facility]  . CT Abdomen Pelvis W Contrast    Standing Status:   Future    Standing Expiration Date:   02/16/2021    Order Specific   Question:   If indicated for the ordered procedure, I authorize the administration of contrast media per Radiology protocol    Answer:   Yes    Order Specific Question:   Preferred imaging location?    Answer:   [facility]    Order Specific Question:   Is Oral Contrast requested for this exam?    Answer:   Yes, Per Radiology protocol     Naama Sappington L Terris Germano, PA-C 02/17/20

## 2020-02-16 NOTE — Progress Notes (Signed)
[facility] Summary Report / Loop Recorder 

## 2020-02-17 ENCOUNTER — Other Ambulatory Visit: Payer: Self-pay

## 2020-02-17 ENCOUNTER — Inpatient Hospital Stay: Payer: Medicare HMO

## 2020-02-17 ENCOUNTER — Inpatient Hospital Stay: Payer: Medicare HMO | Attending: Internal Medicine | Admitting: Physician Assistant

## 2020-02-17 VITALS — BP 136/85 | HR 96 | Temp 98.8°F | Resp 17 | Ht 74.0 in | Wt 192.0 lb

## 2020-02-17 DIAGNOSIS — Z5111 Encounter for antineoplastic chemotherapy: Secondary | ICD-10-CM

## 2020-02-17 DIAGNOSIS — C3490 Malignant neoplasm of unspecified part of unspecified bronchus or lung: Secondary | ICD-10-CM

## 2020-02-17 DIAGNOSIS — I10 Essential (primary) hypertension: Secondary | ICD-10-CM | POA: Insufficient documentation

## 2020-02-17 DIAGNOSIS — Z5112 Encounter for antineoplastic immunotherapy: Secondary | ICD-10-CM

## 2020-02-17 DIAGNOSIS — E119 Type 2 diabetes mellitus without complications: Secondary | ICD-10-CM | POA: Insufficient documentation

## 2020-02-17 DIAGNOSIS — Z8673 Personal history of transient ischemic attack (TIA), and cerebral infarction without residual deficits: Secondary | ICD-10-CM | POA: Diagnosis not present

## 2020-02-17 DIAGNOSIS — C3411 Malignant neoplasm of upper lobe, right bronchus or lung: Secondary | ICD-10-CM

## 2020-02-17 DIAGNOSIS — Z923 Personal history of irradiation: Secondary | ICD-10-CM | POA: Insufficient documentation

## 2020-02-17 DIAGNOSIS — Z79899 Other long term (current) drug therapy: Secondary | ICD-10-CM | POA: Insufficient documentation

## 2020-02-17 DIAGNOSIS — R630 Anorexia: Secondary | ICD-10-CM | POA: Insufficient documentation

## 2020-02-17 DIAGNOSIS — R5383 Other fatigue: Secondary | ICD-10-CM | POA: Diagnosis not present

## 2020-02-17 DIAGNOSIS — Z7984 Long term (current) use of oral hypoglycemic drugs: Secondary | ICD-10-CM | POA: Diagnosis not present

## 2020-02-17 LAB — CBC WITH DIFFERENTIAL (CANCER CENTER ONLY)
Abs Immature Granulocytes: 0.08 10*3/uL — ABNORMAL HIGH (ref 0.00–0.07)
Basophils Absolute: 0 10*3/uL (ref 0.0–0.1)
Basophils Relative: 1 %
Eosinophils Absolute: 0.1 10*3/uL (ref 0.0–0.5)
Eosinophils Relative: 1 %
HCT: 28.6 % — ABNORMAL LOW (ref 39.0–52.0)
Hemoglobin: 9.3 g/dL — ABNORMAL LOW (ref 13.0–17.0)
Immature Granulocytes: 1 %
Lymphocytes Relative: 10 %
Lymphs Abs: 0.6 10*3/uL — ABNORMAL LOW (ref 0.7–4.0)
MCH: 31.3 pg (ref 26.0–34.0)
MCHC: 32.5 g/dL (ref 30.0–36.0)
MCV: 96.3 fL (ref 80.0–100.0)
Monocytes Absolute: 0.8 10*3/uL (ref 0.1–1.0)
Monocytes Relative: 12 %
Neutro Abs: 4.8 10*3/uL (ref 1.7–7.7)
Neutrophils Relative %: 75 %
Platelet Count: 234 10*3/uL (ref 150–400)
RBC: 2.97 MIL/uL — ABNORMAL LOW (ref 4.22–5.81)
RDW: 22.2 % — ABNORMAL HIGH (ref 11.5–15.5)
WBC Count: 6.4 10*3/uL (ref 4.0–10.5)
nRBC: 0 % (ref 0.0–0.2)

## 2020-02-17 LAB — CMP (CANCER CENTER ONLY)
ALT: 23 U/L (ref 0–44)
AST: 29 U/L (ref 15–41)
Albumin: 3.5 g/dL (ref 3.5–5.0)
Alkaline Phosphatase: 76 U/L (ref 38–126)
Anion gap: 10 (ref 5–15)
BUN: 18 mg/dL (ref 8–23)
CO2: 25 mmol/L (ref 22–32)
Calcium: 9.1 mg/dL (ref 8.9–10.3)
Chloride: 109 mmol/L (ref 98–111)
Creatinine: 1.16 mg/dL (ref 0.61–1.24)
GFR, Estimated: >60 mL/min (ref >60)
Glucose, Bld: 143 mg/dL — ABNORMAL HIGH (ref 70–99)
Potassium: 4.3 mmol/L (ref 3.5–5.1)
Sodium: 144 mmol/L (ref 135–145)
Total Bilirubin: 0.6 mg/dL (ref 0.3–1.2)
Total Protein: 6.8 g/dL (ref 6.5–8.1)

## 2020-02-17 LAB — TSH: TSH: 1.459 u[IU]/mL (ref 0.320–4.118)

## 2020-02-17 MED ORDER — SODIUM CHLORIDE 0.9 % IV SOLN
500.0000 mg/m2 | Freq: Once | INTRAVENOUS | Status: AC
  Administered 2020-02-17: 1100 mg via INTRAVENOUS
  Filled 2020-02-17: qty 40

## 2020-02-17 MED ORDER — PROCHLORPERAZINE MALEATE 10 MG PO TABS
10.0000 mg | ORAL_TABLET | Freq: Once | ORAL | Status: AC
  Administered 2020-02-17: 10 mg via ORAL

## 2020-02-17 MED ORDER — PROCHLORPERAZINE MALEATE 10 MG PO TABS
ORAL_TABLET | ORAL | Status: AC
  Filled 2020-02-17: qty 1

## 2020-02-17 MED ORDER — SODIUM CHLORIDE 0.9 % IV SOLN
200.0000 mg | Freq: Once | INTRAVENOUS | Status: AC
  Administered 2020-02-17: 200 mg via INTRAVENOUS
  Filled 2020-02-17: qty 8

## 2020-02-17 MED ORDER — CYANOCOBALAMIN 1000 MCG/ML IJ SOLN
INTRAMUSCULAR | Status: AC
  Filled 2020-02-17: qty 1

## 2020-02-17 MED ORDER — SODIUM CHLORIDE 0.9 % IV SOLN
Freq: Once | INTRAVENOUS | Status: AC
  Filled 2020-02-17: qty 250

## 2020-02-17 MED ORDER — CYANOCOBALAMIN 1000 MCG/ML IJ SOLN
1000.0000 ug | Freq: Once | INTRAMUSCULAR | Status: AC
  Administered 2020-02-17: 1000 ug via INTRAMUSCULAR

## 2020-03-08 ENCOUNTER — Ambulatory Visit (HOSPITAL_COMMUNITY): Payer: Medicare HMO

## 2020-03-08 ENCOUNTER — Encounter (HOSPITAL_COMMUNITY): Payer: Self-pay

## 2020-03-08 ENCOUNTER — Other Ambulatory Visit: Payer: Self-pay

## 2020-03-08 ENCOUNTER — Ambulatory Visit (HOSPITAL_COMMUNITY)
Admission: RE | Admit: 2020-03-08 | Discharge: 2020-03-08 | Disposition: A | Discharge status: Home / Self Care | Payer: Medicare HMO | Source: Ambulatory Visit | Attending: Physician Assistant | Admitting: Physician Assistant

## 2020-03-08 DIAGNOSIS — C3411 Malignant neoplasm of upper lobe, right bronchus or lung: Secondary | ICD-10-CM | POA: Diagnosis not present

## 2020-03-08 DIAGNOSIS — N210 Calculus in bladder: Secondary | ICD-10-CM | POA: Diagnosis not present

## 2020-03-08 DIAGNOSIS — J984 Other disorders of lung: Secondary | ICD-10-CM | POA: Diagnosis not present

## 2020-03-08 DIAGNOSIS — N200 Calculus of kidney: Secondary | ICD-10-CM | POA: Diagnosis not present

## 2020-03-08 DIAGNOSIS — C3490 Malignant neoplasm of unspecified part of unspecified bronchus or lung: Secondary | ICD-10-CM | POA: Diagnosis not present

## 2020-03-08 DIAGNOSIS — J90 Pleural effusion, not elsewhere classified: Secondary | ICD-10-CM | POA: Diagnosis not present

## 2020-03-08 DIAGNOSIS — K5730 Diverticulosis of large intestine without perforation or abscess without bleeding: Secondary | ICD-10-CM | POA: Diagnosis not present

## 2020-03-08 DIAGNOSIS — M47814 Spondylosis without myelopathy or radiculopathy, thoracic region: Secondary | ICD-10-CM | POA: Diagnosis not present

## 2020-03-08 MED ORDER — SODIUM CHLORIDE (PF) 0.9 % IJ SOLN
INTRAMUSCULAR | Status: AC
  Filled 2020-03-08: qty 50

## 2020-03-08 MED ORDER — IOHEXOL 300 MG/ML  SOLN
100.0000 mL | Freq: Once | INTRAMUSCULAR | Status: AC | PRN
  Administered 2020-03-08: 100 mL via INTRAVENOUS

## 2020-03-09 ENCOUNTER — Encounter: Payer: Self-pay | Admitting: Internal Medicine

## 2020-03-09 ENCOUNTER — Other Ambulatory Visit: Payer: Self-pay

## 2020-03-09 ENCOUNTER — Inpatient Hospital Stay: Payer: Medicare HMO

## 2020-03-09 ENCOUNTER — Inpatient Hospital Stay: Payer: Medicare HMO | Admitting: Nutrition

## 2020-03-09 ENCOUNTER — Inpatient Hospital Stay: Payer: Medicare HMO | Attending: Internal Medicine | Admitting: Internal Medicine

## 2020-03-09 VITALS — BP 139/82 | HR 92 | Temp 98.0°F | Resp 20 | Ht 74.0 in | Wt 195.8 lb

## 2020-03-09 DIAGNOSIS — C3411 Malignant neoplasm of upper lobe, right bronchus or lung: Secondary | ICD-10-CM

## 2020-03-09 DIAGNOSIS — R590 Localized enlarged lymph nodes: Secondary | ICD-10-CM | POA: Diagnosis not present

## 2020-03-09 DIAGNOSIS — I10 Essential (primary) hypertension: Secondary | ICD-10-CM

## 2020-03-09 DIAGNOSIS — Z79899 Other long term (current) drug therapy: Secondary | ICD-10-CM | POA: Insufficient documentation

## 2020-03-09 DIAGNOSIS — Z5111 Encounter for antineoplastic chemotherapy: Secondary | ICD-10-CM | POA: Diagnosis not present

## 2020-03-09 DIAGNOSIS — Z923 Personal history of irradiation: Secondary | ICD-10-CM | POA: Insufficient documentation

## 2020-03-09 DIAGNOSIS — Z5112 Encounter for antineoplastic immunotherapy: Secondary | ICD-10-CM

## 2020-03-09 LAB — CBC WITH DIFFERENTIAL (CANCER CENTER ONLY)
Abs Immature Granulocytes: 0.05 10*3/uL (ref 0.00–0.07)
Basophils Absolute: 0.1 10*3/uL (ref 0.0–0.1)
Basophils Relative: 1 %
Eosinophils Absolute: 0.1 10*3/uL (ref 0.0–0.5)
Eosinophils Relative: 1 %
HCT: 31.6 % — ABNORMAL LOW (ref 39.0–52.0)
Hemoglobin: 10.2 g/dL — ABNORMAL LOW (ref 13.0–17.0)
Immature Granulocytes: 1 %
Lymphocytes Relative: 17 %
Lymphs Abs: 1 10*3/uL (ref 0.7–4.0)
MCH: 32.4 pg (ref 26.0–34.0)
MCHC: 32.3 g/dL (ref 30.0–36.0)
MCV: 100.3 fL — ABNORMAL HIGH (ref 80.0–100.0)
Monocytes Absolute: 0.7 10*3/uL (ref 0.1–1.0)
Monocytes Relative: 13 %
Neutro Abs: 3.8 10*3/uL (ref 1.7–7.7)
Neutrophils Relative %: 67 %
Platelet Count: 195 10*3/uL (ref 150–400)
RBC: 3.15 MIL/uL — ABNORMAL LOW (ref 4.22–5.81)
RDW: 19.9 % — ABNORMAL HIGH (ref 11.5–15.5)
WBC Count: 5.6 10*3/uL (ref 4.0–10.5)
nRBC: 0 % (ref 0.0–0.2)

## 2020-03-09 LAB — CMP (CANCER CENTER ONLY)
ALT: 48 U/L — ABNORMAL HIGH (ref 0–44)
AST: 41 U/L (ref 15–41)
Albumin: 3.6 g/dL (ref 3.5–5.0)
Alkaline Phosphatase: 86 U/L (ref 38–126)
Anion gap: 8 (ref 5–15)
BUN: 15 mg/dL (ref 8–23)
CO2: 25 mmol/L (ref 22–32)
Calcium: 9.4 mg/dL (ref 8.9–10.3)
Chloride: 108 mmol/L (ref 98–111)
Creatinine: 1.15 mg/dL (ref 0.61–1.24)
GFR, Estimated: >60 mL/min (ref >60)
Glucose, Bld: 127 mg/dL — ABNORMAL HIGH (ref 70–99)
Potassium: 4.1 mmol/L (ref 3.5–5.1)
Sodium: 141 mmol/L (ref 135–145)
Total Bilirubin: 0.7 mg/dL (ref 0.3–1.2)
Total Protein: 7.3 g/dL (ref 6.5–8.1)

## 2020-03-09 LAB — TSH: TSH: 1.522 u[IU]/mL (ref 0.320–4.118)

## 2020-03-09 MED ORDER — PROCHLORPERAZINE MALEATE 10 MG PO TABS
ORAL_TABLET | ORAL | Status: AC
  Filled 2020-03-09: qty 1

## 2020-03-09 MED ORDER — SODIUM CHLORIDE 0.9 % IV SOLN
Freq: Once | INTRAVENOUS | Status: AC
  Filled 2020-03-09: qty 250

## 2020-03-09 MED ORDER — PROCHLORPERAZINE MALEATE 10 MG PO TABS
10.0000 mg | ORAL_TABLET | Freq: Once | ORAL | Status: AC
  Administered 2020-03-09: 10 mg via ORAL

## 2020-03-09 MED ORDER — SODIUM CHLORIDE 0.9 % IV SOLN
200.0000 mg | Freq: Once | INTRAVENOUS | Status: AC
  Administered 2020-03-09: 200 mg via INTRAVENOUS
  Filled 2020-03-09: qty 8

## 2020-03-09 MED ORDER — SODIUM CHLORIDE 0.9 % IV SOLN
500.0000 mg/m2 | Freq: Once | INTRAVENOUS | Status: AC
  Administered 2020-03-09: 1100 mg via INTRAVENOUS
  Filled 2020-03-09: qty 40

## 2020-03-09 NOTE — Patient Instructions (Signed)
[facility] Discharge Instructions for Patients Receiving Chemotherapy  Today you received the following chemotherapy agents Keytruda; Alimta  To help prevent nausea and vomiting after your treatment, we encourage you to take your nausea medication as directed   If you develop nausea and vomiting that is not controlled by your nausea medication, call the clinic.   BELOW ARE SYMPTOMS THAT SHOULD BE REPORTED IMMEDIATELY:  *FEVER GREATER THAN 100.5 F  *CHILLS WITH OR WITHOUT FEVER  NAUSEA AND VOMITING THAT IS NOT CONTROLLED WITH YOUR NAUSEA MEDICATION  *UNUSUAL SHORTNESS OF BREATH  *UNUSUAL BRUISING OR BLEEDING  TENDERNESS IN MOUTH AND THROAT WITH OR WITHOUT PRESENCE OF ULCERS  *URINARY PROBLEMS  *BOWEL PROBLEMS  UNUSUAL RASH Items with * indicate a potential emergency and should be followed up as soon as possible.  Feel free to call the clinic should you have any questions or concerns. The clinic phone number is [phone].  Please show the CHEMO ALERT CARD at check-in to the Emergency Department and triage nurse.   

## 2020-03-09 NOTE — Progress Notes (Signed)
Nutrition follow-up completed with patient during infusion for lung cancer. Patient's weight has improved and was documented as 195.8 pounds today up from 188.3 pounds on October 27. Patient reports his appetite is good and he is eating better. He has occasional nausea which he describes as unexpected but usually happens around dinnertime. Continues to have some taste alterations but they are manageable.  Nutrition diagnosis: Unintended weight loss improved.  Intervention: Educated patient to take nausea medicine if he anticipates nausea. Education provided on bland diet and other strategies for eating with nausea and vomiting. Reviewed brief information on taste alterations. Recommended patient could try Carnation breakfast essentials at dinner if food intake is decreased.  Monitoring, evaluation, goals: Patient will tolerate adequate calories and protein for weight maintenance.  No follow-up has been scheduled.  Patient has contact information for questions or concerns.  **Disclaimer: This note was dictated with voice recognition software. Similar sounding words can inadvertently be transcribed and this note may contain transcription errors which may not have been corrected upon publication of note.**

## 2020-03-09 NOTE — Progress Notes (Signed)
Micheal [phone]   [phone]  OFFICE PROGRESS NOTE  Micheal Oliver, Micheal Oliver, Micheal Oliver [address] [place] [address]  DIAGNOSIS: stage IV (T3, N2, M1a) non-small cell lung cancer, adenocarcinoma presented with large right upper lobe lung mass extending to the right suprahilar region in addition to right hilar and mediastinal lymphadenopathy with obstruction of the associated upper lobe bronchus and bilateral subcentimeter pulmonary nodules diagnosed in July 2021.  Biomarker Findings Microsatellite status - MS-Stable Tumor Mutational Burden - 3 Muts/Mb Genomic Findings For a complete list of the genes assayed, please refer to the Appendix. ERBB2 [id number], amplification - equivocal? HGF amplification - equivocal? MTAP loss exons 5-8 MYC amplification - equivocal? CDKN2A/B CDKN2B loss, CDKN2A loss CUL3 Y69f*19 RPTOR amplification TP53 [id number] Disease relevant genes with no reportable alterations: ALK, BRAF, EGFR, KRAS, MET, RET, ROS1  PDL1 Expression: Negative  PRIOR THERAPY: Palliative radiotherapy to the obstructive lung mass under the care of Dr. MoLisbeth RenshawThe past treatment is scheduled for8/23/21.  CURRENT THERAPY: Systemic chemotherapy with carboplatin for AUC of 5, Alimta 500 mg/M2 and Keytruda 200 mg IV every 3 weeks.  First dose November 04, 2019.  Status post 6 cycles.  Starting from cycle #5 he will be on maintenance treatment with Alimta and Keytruda every 3 weeks.  INTERVAL HISTORY: Micheal Hawkes69 1.o. male returns to the clinic today for follow-up visit.  The patient is feeling fine today with no concerning complaints except for mild shortness of breath with exertion and fatigue.  He felt much better after receiving 2 units of PRBCs transfusion.He denied having any nausea, vomiting, diarrhea or constipation.  He denied having any headache or visual changes.  He has no significant weight loss or night  sweats.  He continues to tolerate his treatment with maintenance Alimta and Keytruda fairly well.  He had repeat CT scan of the chest, abdomen pelvis performed recently and he is here for evaluation and discussion of his scan results.  MEDICAL HISTORY: Past Medical History:  Diagnosis Date  . Chicken pox   . DM2 (diabetes mellitus, type 2) ([facility]  . History of kidney stones    passed several   . Hypertension   . Kidney stones    5-6 times  . Measles   . Mumps   . Stroke ([facility]   x 2, last one was 09/18/2019- has some expressive aphasia    ALLERGIES:  is allergic to bee venom.  MEDICATIONS:  Current Outpatient Medications  Medication Sig Dispense Refill  . acetaminophen (TYLENOL) 500 MG tablet Take 500 mg by mouth every 6 (six) hours as needed.     . Marland Kitchentorvastatin (LIPITOR) 40 MG tablet Take 1 tablet (40 mg total) by mouth daily at 6 PM. 90 tablet 3  . clopidogrel (PLAVIX) 75 MG tablet Take 1 tablet (75 mg total) by mouth daily. Okay to restart this medication on 10/01/2019. 90 tablet 3  . folic acid (FOLVITE) 1 MG tablet Take 1 tablet (1 mg total) by mouth daily. 30 tablet 4  . gabapentin (NEURONTIN) 300 MG capsule Take 1 capsule (300 mg total) by mouth at bedtime. 90 capsule 3  . HYDROcodone-homatropine (HYCODAN) 5-1.5 MG/5ML syrup Take 5 mLs by mouth every 6 (six) hours as needed for cough. 120 mL 0  . metFORMIN (GLUCOPHAGE) 500 MG tablet TAKE TWO TABLETS BY MOUTH TWICE A DAY WITH MEAL(S) 360 tablet 3  . Omega-3 Fatty Acids (FISH  OIL) 1200 MG CAPS Take by mouth daily.    . prochlorperazine (COMPAZINE) 10 MG tablet Take 10 mg by mouth every 6 (six) hours as needed.     . sitaGLIPtin (JANUVIA) 100 MG tablet Take 1 tablet (100 mg total) by mouth daily. 30 tablet 6  . triamcinolone cream (KENALOG) 0.1 % Apply topically daily.     No current facility-administered medications for this visit.    SURGICAL HISTORY:  Past Surgical History:  Procedure Laterality Date  . COLONOSCOPY W/  POLYPECTOMY    . VIDEO BRONCHOSCOPY WITH ENDOBRONCHIAL ULTRASOUND N/A 09/30/2019   Procedure: VIDEO BRONCHOSCOPY WITH ENDOBRONCHIAL ULTRASOUND;  Surgeon: Collene Gobble, Micheal Oliver;  Location: [facility];  Service: Thoracic;  Laterality: N/A;  . WISDOM TOOTH EXTRACTION  1976-77    REVIEW OF SYSTEMS:  Constitutional: positive for fatigue Eyes: negative Ears, nose, mouth, throat, and face: negative Respiratory: positive for dyspnea on exertion Cardiovascular: negative Gastrointestinal: negative Genitourinary:negative Integument/breast: negative Hematologic/lymphatic: negative Musculoskeletal:negative Neurological: negative Behavioral/Psych: negative Endocrine: negative Allergic/Immunologic: negative   PHYSICAL EXAMINATION: General appearance: alert, cooperative, fatigued and no distress Head: Normocephalic, without obvious abnormality, atraumatic Neck: no adenopathy, no JVD, supple, symmetrical, trachea midline and thyroid not enlarged, symmetric, no tenderness/mass/nodules Lymph nodes: Cervical, supraclavicular, and axillary nodes normal. Resp: clear to auscultation bilaterally Back: symmetric, no curvature. ROM normal. No CVA tenderness. Cardio: regular rate and rhythm, S1, S2 normal, no murmur, click, rub or gallop GI: soft, non-tender; bowel sounds normal; no masses,  no organomegaly Extremities: extremities normal, atraumatic, no cyanosis or edema Neurologic: Alert and oriented X 3, normal strength and tone. Normal symmetric reflexes. Normal coordination and gait  ECOG PERFORMANCE STATUS: 1 - Symptomatic but completely ambulatory  Blood pressure 139/82, pulse 92, temperature 98 F (36.7 C), temperature source Tympanic, resp. rate 20, height _0  (1.88 m), weight 195 lb 12.8 oz (88.8 kg), SpO2 99 %.  LABORATORY DATA: Lab Results  Component Value Date   WBC 5.6 03/09/2020   HGB 10.2 (L) 03/09/2020   HCT 31.6 (L) 03/09/2020   MCV 100.3 (H) 03/09/2020   PLT 195 03/09/2020       Chemistry      Component Value Date/Time   NA 141 03/09/2020 0940   K 4.1 03/09/2020 0940   CL 108 03/09/2020 0940   CO2 25 03/09/2020 0940   BUN 15 03/09/2020 0940   CREATININE 1.15 03/09/2020 0940   CREATININE 1.13 10/16/2013 1118      Component Value Date/Time   CALCIUM 9.4 03/09/2020 0940   ALKPHOS 86 03/09/2020 0940   AST 41 03/09/2020 0940   ALT 48 (H) 03/09/2020 0940   BILITOT 0.7 03/09/2020 0940       RADIOGRAPHIC STUDIES: CT Chest W Contrast  Result Date: 03/08/2020 CLINICAL DATA:  Primary Cancer Type: Lung Imaging Indication: Assess response to therapy Interval therapy since last imaging? Yes Initial Cancer Diagnosis Date: 09/30/2019; Established by: Biopsy-proven Detailed Pathology: Stage IV non-small cell lung cancer, adenocarcinoma. Primary Tumor location: Right upper lobe. Surgeries: No. Chemotherapy: Yes; Ongoing? Yes; Most recent administration: 02/17/2020 Immunotherapy?  Yes; Type: Keytruda; Ongoing? Yes Radiation therapy? Yes; Date Range: 11/03/2019 - 11/23/2019; Target: Right upper lung. EXAM: CT CHEST, ABDOMEN, AND PELVIS WITH CONTRAST TECHNIQUE: Multidetector CT imaging of the chest, abdomen and pelvis was performed following the standard protocol during bolus administration of intravenous contrast. CONTRAST:  121m OMNIPAQUE IOHEXOL 300 MG/ML  SOLN COMPARISON:  Most recent CT chest, abdomen and pelvis 01/04/2020. 10/26/2019 PET-CT. FINDINGS: CT CHEST FINDINGS Cardiovascular:  Normal heart size. No significant pericardial effusion/thickening. Great vessels are normal in course and caliber. No central pulmonary emboli. Mediastinum/Nodes: No discrete thyroid nodules. Unremarkable esophagus. No axillary adenopathy. No new or residual pathologically enlarged mediastinal or hilar lymph nodes. Previously mildly enlarged right paratracheal, subcarinal and right hilar nodes have all decreased. Lungs/Pleura: No pneumothorax. Small dependent right pleural effusion is new. No left  pleural effusion. Central right upper lobe 4.5 x 3.1 cm mass (series 3/image 39), previously 4.5 x 3.2 cm, stable, with associated volume loss and distortion. New sharply marginated patchy consolidation and ground-glass opacity in the posterior right lower lobe, compatible with evolving postradiation change. Dominant solid left lower lobe 8 mm pulmonary nodule (series 3/image 90), previously 6 mm, increased. Numerous (greater than 10) additional smaller solid pulmonary nodules scattered in both lungs, not substantially changed. Musculoskeletal: No aggressive appearing focal osseous lesions. Minimal thoracic spondylosis. Subcutaneous loop recorder in the ventral left chest wall. CT ABDOMEN PELVIS FINDINGS Hepatobiliary: Normal liver with no liver mass. Normal gallbladder with no radiopaque cholelithiasis. No biliary ductal dilatation. Pancreas: Normal, with no mass or duct dilation. Spleen: Normal size. No mass. Adrenals/Urinary Tract: Normal adrenals. Nonobstructing 2 mm upper right and 3 mm lower left renal stones. No hydronephrosis. No renal masses. Layering 13 mm right bladder stone. Otherwise normal nondistended bladder. Stomach/Bowel: Normal non-distended stomach. Normal caliber small bowel with no small bowel wall thickening. Normal appendix. Oral contrast transits to rectum. Marked diffuse colonic diverticulosis, most prominent in the sigmoid colon, with no definite large bowel wall thickening or acute pericolonic fat stranding. Vascular/Lymphatic: Atherosclerotic nonaneurysmal abdominal aorta. Patent portal, splenic, hepatic and renal veins. No pathologically enlarged lymph nodes in the abdomen or pelvis. Reproductive: Normal size prostate. Other: No pneumoperitoneum, ascites or focal fluid collection. Musculoskeletal: No aggressive appearing focal osseous lesions. Marked lumbar spondylosis. IMPRESSION: 1. Mild mixed interval changes in the chest. 2. Central right upper lobe 4.5 cm lung mass is stable. New  postradiation change in the posterior right lower lobe. New small dependent right pleural effusion. 3. Dominant solid 8 mm left lower lobe pulmonary nodule, mildly increased, suspicious for enlarging pulmonary metastasis. Additional smaller solid pulmonary nodules scattered in both lungs, not substantially changed. 4. Decreased mediastinal and right hilar adenopathy. 5. No evidence of metastatic disease in the abdomen or pelvis. 6. Chronic findings include: Nonobstructing bilateral nephrolithiasis. Layering 13 mm right bladder stone. Marked diffuse colonic diverticulosis. Aortic Atherosclerosis (ICD10-I70.0). Electronically Signed   By: Ilona Sorrel M.D.   On: 03/08/2020 10:27   CT Abdomen Pelvis W Contrast  Result Date: 03/08/2020 CLINICAL DATA:  Primary Cancer Type: Lung Imaging Indication: Assess response to therapy Interval therapy since last imaging? Yes Initial Cancer Diagnosis Date: 09/30/2019; Established by: Biopsy-proven Detailed Pathology: Stage IV non-small cell lung cancer, adenocarcinoma. Primary Tumor location: Right upper lobe. Surgeries: No. Chemotherapy: Yes; Ongoing? Yes; Most recent administration: 02/17/2020 Immunotherapy?  Yes; Type: Keytruda; Ongoing? Yes Radiation therapy? Yes; Date Range: 11/03/2019 - 11/23/2019; Target: Right upper lung. EXAM: CT CHEST, ABDOMEN, AND PELVIS WITH CONTRAST TECHNIQUE: Multidetector CT imaging of the chest, abdomen and pelvis was performed following the standard protocol during bolus administration of intravenous contrast. CONTRAST:  132m OMNIPAQUE IOHEXOL 300 MG/ML  SOLN COMPARISON:  Most recent CT chest, abdomen and pelvis 01/04/2020. 10/26/2019 PET-CT. FINDINGS: CT CHEST FINDINGS Cardiovascular: Normal heart size. No significant pericardial effusion/thickening. Great vessels are normal in course and caliber. No central pulmonary emboli. Mediastinum/Nodes: No discrete thyroid nodules. Unremarkable esophagus. No axillary adenopathy. No new  or residual  pathologically enlarged mediastinal or hilar lymph nodes. Previously mildly enlarged right paratracheal, subcarinal and right hilar nodes have all decreased. Lungs/Pleura: No pneumothorax. Small dependent right pleural effusion is new. No left pleural effusion. Central right upper lobe 4.5 x 3.1 cm mass (series 3/image 39), previously 4.5 x 3.2 cm, stable, with associated volume loss and distortion. New sharply marginated patchy consolidation and ground-glass opacity in the posterior right lower lobe, compatible with evolving postradiation change. Dominant solid left lower lobe 8 mm pulmonary nodule (series 3/image 90), previously 6 mm, increased. Numerous (greater than 10) additional smaller solid pulmonary nodules scattered in both lungs, not substantially changed. Musculoskeletal: No aggressive appearing focal osseous lesions. Minimal thoracic spondylosis. Subcutaneous loop recorder in the ventral left chest wall. CT ABDOMEN PELVIS FINDINGS Hepatobiliary: Normal liver with no liver mass. Normal gallbladder with no radiopaque cholelithiasis. No biliary ductal dilatation. Pancreas: Normal, with no mass or duct dilation. Spleen: Normal size. No mass. Adrenals/Urinary Tract: Normal adrenals. Nonobstructing 2 mm upper right and 3 mm lower left renal stones. No hydronephrosis. No renal masses. Layering 13 mm right bladder stone. Otherwise normal nondistended bladder. Stomach/Bowel: Normal non-distended stomach. Normal caliber small bowel with no small bowel wall thickening. Normal appendix. Oral contrast transits to rectum. Marked diffuse colonic diverticulosis, most prominent in the sigmoid colon, with no definite large bowel wall thickening or acute pericolonic fat stranding. Vascular/Lymphatic: Atherosclerotic nonaneurysmal abdominal aorta. Patent portal, splenic, hepatic and renal veins. No pathologically enlarged lymph nodes in the abdomen or pelvis. Reproductive: Normal size prostate. Other: No pneumoperitoneum,  ascites or focal fluid collection. Musculoskeletal: No aggressive appearing focal osseous lesions. Marked lumbar spondylosis. IMPRESSION: 1. Mild mixed interval changes in the chest. 2. Central right upper lobe 4.5 cm lung mass is stable. New postradiation change in the posterior right lower lobe. New small dependent right pleural effusion. 3. Dominant solid 8 mm left lower lobe pulmonary nodule, mildly increased, suspicious for enlarging pulmonary metastasis. Additional smaller solid pulmonary nodules scattered in both lungs, not substantially changed. 4. Decreased mediastinal and right hilar adenopathy. 5. No evidence of metastatic disease in the abdomen or pelvis. 6. Chronic findings include: Nonobstructing bilateral nephrolithiasis. Layering 13 mm right bladder stone. Marked diffuse colonic diverticulosis. Aortic Atherosclerosis (ICD10-I70.0). Electronically Signed   By: Ilona Sorrel M.D.   On: 03/08/2020 10:27   CUP PACEART REMOTE DEVICE CHECK  Result Date: 02/15/2020 ILR summary report received. Battery status OK. Normal device function. 3 AF events appears true a fib, variable R-R intervals noted, longest duration 2 minutes.  No new symptom, tachy, brady, or pause episodes. Monthly summary reports and ROV/PRN.  Routed to triage to eval a fib events.  R. Powers, LPN, CVRS Reviewed with Dr. Quentin Ore, episodes appears SR. Continue to monitor. LW   ASSESSMENT AND PLAN: This is a very pleasant 69 years old white male recently diagnosed with a stage IV (T3, N2, M1 a) non-small cell lung cancer, adenocarcinoma presented with large right upper lobe lung mass with extension to the right suprahilar region as well as right hilar and mediastinal lymphadenopathy with obstruction of the right upper lobe and bilateral pulmonary nodules diagnosed in July 2021. The molecular study shows no actionable mutations and PD-L1 expression is negative. He had a short course of palliative radiotherapy to the obstructive lung  mass under the care of Dr. Lisbeth Renshaw. The patient is currently undergoing systemic chemotherapy with carboplatin for AUC of 5, Alimta 500 mg/M2 and Keytruda 200 mg IV every 3 weeks status  post 6 cycles.  Starting from cycle #5 he will be on maintenance treatment with Alimta and Keytruda every 3 weeks The patient has been tolerating this treatment well except for fatigue and mild shortness of breath. He had repeat CT scan of the chest, abdomen pelvis performed recently.  I personally and independently reviewed the scan images and discussed the results and showed the images to the patient today. His scan showed a stable disease with improvement on the mediastinal lymphadenopathy but there was a slightly enlarging left upper lobe lung nodule that need close monitoring on upcoming imaging studies. I recommended for the patient to continue his current treatment with maintenance Alimta and Keytruda and he will proceed with cycle #7 today. He will come back for follow-up visit in 3 weeks for evaluation before the next cycle of his treatment. The patient was advised to call immediately if he has any concerning symptoms in the interval. For the thyroid malignancy, he was referred to Dr. Harlow Asa for evaluation and surgical intervention.  The patient voices understanding of current disease status and treatment options and is in agreement with the current care plan.  All questions were answered. The patient knows to call the clinic with any problems, questions or concerns. We can certainly see the patient much sooner if necessary.  Disclaimer: This note was dictated with voice recognition software. Similar sounding words can inadvertently be transcribed and may not be corrected upon review.

## 2020-03-17 LAB — CUP PACEART REMOTE DEVICE CHECK
Date Time Interrogation Session: 20211216121141
Implantable Pulse Generator Implant Date: 20210907

## 2020-03-21 ENCOUNTER — Ambulatory Visit (INDEPENDENT_AMBULATORY_CARE_PROVIDER_SITE_OTHER): Payer: Medicare HMO

## 2020-03-21 DIAGNOSIS — I639 Cerebral infarction, unspecified: Secondary | ICD-10-CM

## 2020-03-24 ENCOUNTER — Ambulatory Visit (INDEPENDENT_AMBULATORY_CARE_PROVIDER_SITE_OTHER): Payer: Medicare HMO | Admitting: Family Medicine

## 2020-03-24 ENCOUNTER — Encounter: Payer: Self-pay | Admitting: Family Medicine

## 2020-03-24 ENCOUNTER — Other Ambulatory Visit: Payer: Self-pay

## 2020-03-24 VITALS — BP 122/88 | HR 94 | Resp 19 | Ht 74.0 in | Wt 196.0 lb

## 2020-03-24 DIAGNOSIS — E785 Hyperlipidemia, unspecified: Secondary | ICD-10-CM | POA: Diagnosis not present

## 2020-03-24 DIAGNOSIS — E119 Type 2 diabetes mellitus without complications: Secondary | ICD-10-CM | POA: Diagnosis not present

## 2020-03-24 DIAGNOSIS — C3411 Malignant neoplasm of upper lobe, right bronchus or lung: Secondary | ICD-10-CM | POA: Diagnosis not present

## 2020-03-24 DIAGNOSIS — Z8673 Personal history of transient ischemic attack (TIA), and cerebral infarction without residual deficits: Secondary | ICD-10-CM | POA: Diagnosis not present

## 2020-03-24 DIAGNOSIS — I10 Essential (primary) hypertension: Secondary | ICD-10-CM

## 2020-03-24 LAB — MICROALBUMIN / CREATININE URINE RATIO
Creatinine,U: 123.9 mg/dL
Microalb Creat Ratio: 4.7 mg/g (ref 0.0–30.0)
Microalb, Ur: 5.8 mg/dL — ABNORMAL HIGH (ref 0.0–1.9)

## 2020-03-24 LAB — HEMOGLOBIN A1C: Hgb A1c MFr Bld: 6.3 % (ref 4.6–6.5)

## 2020-03-24 MED ORDER — SITAGLIPTIN PHOSPHATE 100 MG PO TABS
100.0000 mg | ORAL_TABLET | Freq: Every day | ORAL | 3 refills | Status: DC
Start: 2020-03-24 — End: 2020-07-21

## 2020-03-24 MED ORDER — FOLIC ACID 1 MG PO TABS
1.0000 mg | ORAL_TABLET | Freq: Every day | ORAL | 3 refills | Status: DC
Start: 2020-03-24 — End: 2020-09-14

## 2020-03-24 NOTE — Addendum Note (Signed)
Addended by: Lamar Blinks C on: 03/24/2020 02:35 PM   Modules accepted: Orders

## 2020-03-24 NOTE — Progress Notes (Addendum)
Micheal Oliver at [facility] [address] [place], [place] [address] [phone]  Date:  03/24/2020   Name:  Micheal Oliver   DOB:  11/08/1950   MRN:  [id number]  PCP:  Darreld Mclean, MD    Chief Complaint: Diabetes   History of Present Illness:  Micheal Oliver is a 69 y.o. very pleasant male patient who presents with the following:  Patient today for a 48-monthfollow-up visit  Last seen by myself in September: - History of hypertension, diabetes, hyperlipidemia, family history of early CAD, ED Last seen by myself in June of this year. At that tme we were following up from the hospital admission for stroke, with incidental discovery of lung cancer He is now being treated by Dr. MJulien Nordmannfor right upper lobe lung cancer-unfortunately his lung cancer is stage IV.  Patient has never been a smoker  At last visit WEdgardwas doing reasonably well He and his wife Micheal Galawere moving into a smaller home in a retirement community-this continues to be a good choice  In September his A1c had gone up from 7.3 to 8.4% -we added Januvia to his Metformin regimen  Most recent visit with his oncologist, Dr. MJulien NordmannDecember 8: ASSESSMENT AND PLAN: This is a very pleasant 69years old white male recently diagnosed with a stage IV (T3, N2, M1 a) non-small cell lung cancer, adenocarcinoma presented with large right upper lobe lung mass with extension to the right suprahilar region as well as right hilar and mediastinal lymphadenopathy with obstruction of the right upper lobe and bilateral pulmonary nodules diagnosed in July 2021. The molecular study shows no actionable mutations and PD-L1 expression is negative. He had a short course of palliative radiotherapy to the obstructive lung mass under the care of Dr. MLisbeth Renshaw The patient is currently undergoing systemic chemotherapy with carboplatin for AUC of 5, Alimta 500 mg/M2 and Keytruda 200 mg IV every 3  weeks status post 6 cycles.  Starting from cycle #5 he will be on maintenance treatment with Alimta and Keytruda every 3 weeks  He will follow-up with oncology in 3 weeks He had also been referred to Dr. GHarlow Asafor concern of thyroid cancer- per pt he does not appear to have thyroid cancer after all!  This is great news He received 2 units of blood recently and felt quite a bit better, hemoglobin improved to 10.2.  He notes that his energy level and exercise tolerance has improved since he received blood transfusion  He feels like his glucose is responding pretty well to addition of Januvia  Diabetic eye exam- - scheduled for 04/18/20 Urine microalbumin- will update today  Can repeat A1c today COVID vaccine complete including booster Flu shot up-to-date Patient Active Problem List   Diagnosis Date Noted  . Thyroid nodule 11/26/2019  . Oral thrush 11/10/2019  . Cough 11/10/2019  . Encounter for antineoplastic chemotherapy 10/28/2019  . Encounter for antineoplastic immunotherapy 10/28/2019  . Goals of care, counseling/discussion 10/15/2019  . Malignant neoplasm of right upper lobe of lung ([facility] [place] 10/11/2019  . Mediastinal lymphadenopathy 09/30/2019  . Mass of right lung 09/24/2019  . Stroke ([facility] 09/18/2019  . Hypertension associated with diabetes ([facility] [place] 09/18/2019  . Hyperlipidemia associated with type 2 diabetes mellitus ([facility] 09/18/2019  . Eyelid twitch 09/28/2018  . Controlled type 2 diabetes mellitus without complication, without long-term current use of insulin ([facility] [place] 07/12/2016  . Hyperlipidemia 01/10/2016  . Family history of early  CAD 07/11/2015  . Essential hypertension, benign 01/29/2014  . Erectile dysfunction 10/16/2013    Past Medical History:  Diagnosis Date  . Chicken pox   . DM2 (diabetes mellitus, type 2) ([place])   . History of kidney stones    passed several   . Hypertension   . Kidney stones    5-6 times  . Measles   . Mumps   . Stroke ([place])    x 2, last one  was 09/18/2019- has some expressive aphasia    Past Surgical History:  Procedure Laterality Date  . COLONOSCOPY W/ POLYPECTOMY    . VIDEO BRONCHOSCOPY WITH ENDOBRONCHIAL ULTRASOUND N/A 09/30/2019   Procedure: VIDEO BRONCHOSCOPY WITH ENDOBRONCHIAL ULTRASOUND;  Surgeon: Collene Gobble, MD;  Location: [place];  Service: Thoracic;  Laterality: N/A;  . WISDOM TOOTH EXTRACTION  1976-77    Social History   Tobacco Use  . Smoking status: Never Smoker  . Smokeless tobacco: Never Used  Vaping Use  . Vaping Use: Never used  Substance Use Topics  . Alcohol use: No  . Drug use: No    Family History  Problem Relation Age of Onset  . Alcohol abuse Father 72       Deceased  . Heart disease Father   . Lung disease Father   . Hypertension Father   . Stomach cancer Mother 51       Deceased  . Hypertension Paternal Grandmother   . Dementia Paternal Grandmother   . Stroke Paternal Grandmother   . Arthritis Maternal Grandmother   . Sudden death Paternal Grandfather   . Sudden death Maternal Grandfather   . Heart attack Paternal Aunt   . Other Maternal Aunt        Natural Causes  . Healthy Son   . Anxiety disorder Daughter     Allergies  Allergen Reactions  . Bee Venom     Lip swelling    Medication list has been reviewed and updated.  Current Outpatient Medications on File Prior to Visit  Medication Sig Dispense Refill  . acetaminophen (TYLENOL) 500 MG tablet Take 500 mg by mouth every 6 (six) hours as needed.     Marland Kitchen atorvastatin (LIPITOR) 40 MG tablet Take 1 tablet (40 mg total) by mouth daily at 6 PM. 90 tablet 3  . clopidogrel (PLAVIX) 75 MG tablet Take 1 tablet (75 mg total) by mouth daily. Okay to restart this medication on 10/01/2019. 90 tablet 3  . folic acid (FOLVITE) 1 MG tablet Take 1 tablet (1 mg total) by mouth daily. 30 tablet 4  . gabapentin (NEURONTIN) 300 MG capsule Take 1 capsule (300 mg total) by mouth at bedtime. 90 capsule 3  . HYDROcodone-homatropine (HYCODAN)  5-1.5 MG/5ML syrup Take 5 mLs by mouth every 6 (six) hours as needed for cough. 120 mL 0  . metFORMIN (GLUCOPHAGE) 500 MG tablet TAKE TWO TABLETS BY MOUTH TWICE A DAY WITH MEAL(S) 360 tablet 3  . Omega-3 Fatty Acids (FISH OIL) 1200 MG CAPS Take by mouth daily.    . prochlorperazine (COMPAZINE) 10 MG tablet Take 10 mg by mouth every 6 (six) hours as needed.     . sitaGLIPtin (JANUVIA) 100 MG tablet Take 1 tablet (100 mg total) by mouth daily. 30 tablet 6   No current facility-administered medications on file prior to visit.    Review of Systems:  As per HPI- otherwise negative.   Physical Examination: Vitals:   03/24/20 0820  BP: 122/88  Pulse: 94  Resp: 19  SpO2: 99%   Vitals:   03/24/20 0820  Weight: 196 lb (88.9 kg)  Height: _0  (1.88 m)   Body mass index is 25.16 kg/m. Ideal Body Weight: Weight in (lb) to have BMI = 25: 194.3  GEN: no acute distress.  Looks well  HEENT: Atraumatic, Normocephalic.  Ears and Nose: No external deformity. CV: RRR, No M/G/R. No JVD. No thrill. No extra heart sounds. PULM: CTA B, no wheezes, crackles, rhonchi. No retractions. No resp. distress. No accessory muscle use. ABD: S, NT, ND, +BS. No rebound. No HSM. EXTR: No c/c/e PSYCH: Normally interactive. Conversant.    Assessment and Plan: Controlled type 2 diabetes mellitus without complication, without long-term current use of insulin (HCC) - Plan: Hemoglobin A1c, Microalbumin / creatinine urine ratio  Hyperlipidemia, unspecified hyperlipidemia type  Essential hypertension, benign  History of CVA (cerebrovascular accident)  Malignant neoplasm of right upper lobe of lung [facility])    Patient today for follow-up of his diabetes.  He also suffered a stroke and was diagnosed with lung cancer over the last 6 months His only residual symptoms from his stroke is mild difficulty with word finding.  Otherwise he is doing okay in this regard.  He is undergoing chemotherapy per oncology and doing  relatively well  Blood pressure under good control  We added Januvia to his diabetes regimen at last visit, await A1c and also microalbumin urine today, will follow up with patient  Assuming A1c is ok will need to rx Januvia to 90 days  This visit occurred during the SARS-CoV-2 public health emergency.  Safety protocols were in place, including screening questions prior to the visit, additional usage of staff PPE, and extensive cleaning of exam room while observing appropriate contact time as indicated for disinfecting solutions.    Signed Lamar Blinks, MD  Results for orders placed or performed in visit on 03/24/20  Hemoglobin A1c  Result Value Ref Range   Hgb A1c MFr Bld 6.3 4.6 - 6.5 %  Microalbumin / creatinine urine ratio  Result Value Ref Range   Microalb, Ur 5.8 (H) 0.0 - 1.9 mg/dL   Creatinine,U 123.9 mg/dL   Microalb Creat Ratio 4.7 0.0 - 30.0 mg/g

## 2020-03-24 NOTE — Patient Instructions (Signed)
Good to see you again today- I hope that you continue to do well We will check your A1c and urine protein today- I will be in touch with your results As long as you are doing well we can visit in 4-6 months

## 2020-03-28 NOTE — Progress Notes (Signed)
[facility] OFFICE PROGRESS NOTE  Copland, Gay Filler, MD [address] [place] [address]  DIAGNOSIS: Stage IV(T3, N2, M1a) non-small cell lung cancer, adenocarcinoma presented with large right upper lobe lung mass extending to the right suprahilar region in addition to right hilar and mediastinal lymphadenopathy with obstruction of the associated upper lobe bronchusandbilateral subcentimeter pulmonary nodulesdiagnosed in July 2021.  Biomarker Findings Microsatellite status - MS-Stable Tumor Mutational Burden - 3 Muts/Mb Genomic Findings For a complete list of the genes assayed, please refer to the Appendix. ERBB2 [id number], amplification - equivocal HGF amplification - equivocal MTAP loss exons 5-8 MYC amplification - equivocal CDKN2A/B CDKN2B loss, CDKN2A loss CUL3 Y16f*19 RPTOR amplification TP53 [id number] Disease relevant genes with no reportable alterations: ALK, BRAF, EGFR, KRAS, MET, RET, ROS1  PDL1 Expression: Negative  PRIOR THERAPY: Palliative radiotherapy to the obstructive lung mass under the care of Dr. MoLisbeth RenshawThe past treatment is scheduled for8/23/21  CURRENT THERAPY: Systemic chemotherapy with carboplatin for AUC of 5, Alimta 500 mg/M2 and Keytruda 200 mg IV every 3 weeks. First dose November 04, 2019.Status post7cycles. Starting from cycle #5 the patient has been on maintenance Alimta and Keytruda IV every 3 weeks.   INTERVAL HISTORY: Micheal Sievers89.o. male returns to the clinic today for a follow up visit. The patient is feeling fairly well today without any concerning complaints. He needs a refill of his compazine. He states sometimes he has nausea at night time. He tolerated his last cycle of treatment well except for 1 episode of vomiting.. The patient denies any fever, chills, or night sweats. The patient has been following with a member the nutritionist team for his decreased appetite.  The patient  states his appetite is improved at this time. He has gained about 2 lbs since his last appointment. The patient denies any chest pain or hemoptysis. He believes his cough has slightly worsened. He denies sick contacts. He is requesting a refill of his hycodan. He reports mild shortness of breath with exertion. He denies any constipation or diarrhea. The patient denies any headaches. He reports worsening vision with reading and made an appointment with his eye doctor on January 17th. He denies any rashes or skin changes. The patient is here today for evaluation before starting cycle #8.     MEDICAL HISTORY: Past Medical History:  Diagnosis Date   Chicken pox    DM2 (diabetes mellitus, type 2) ([facility]   History of kidney stones    passed several    Hypertension    Kidney stones    5-6 times   Measles    Mumps    [place] [place]   [place], last one was 09/18/2019- has some expressive aphasia    ALLERGIES:  is allergic to bee venom.  MEDICATIONS:  Current Outpatient Medications  Medication Sig Dispense Refill   acetaminophen (TYLENOL) 500 MG tablet Take 500 mg by mouth every 6 (six) hours as needed.      atorvastatin (LIPITOR) 40 MG tablet Take 1 tablet (40 mg total) by mouth daily at 6 PM. 90 tablet 3   clopidogrel (PLAVIX) 75 MG tablet Take 1 tablet (75 mg total) by mouth daily. Okay to restart this medication on 10/01/2019. 90 tablet 3   folic acid (FOLVITE) 1 MG tablet Take 1 tablet (1 mg total) by mouth daily. 90 tablet 3   gabapentin (NEURONTIN) 300 MG capsule Take 1 capsule (300 mg total) by mouth at  bedtime. 90 capsule 3   metFORMIN (GLUCOPHAGE) 500 MG tablet TAKE TWO TABLETS BY MOUTH TWICE A DAY WITH MEAL(S) 360 tablet 3   Omega-3 Fatty Acids (FISH OIL) 1200 MG CAPS Take by mouth daily.     sitaGLIPtin (JANUVIA) 100 MG tablet Take 1 tablet (100 mg total) by mouth daily. 90 tablet 3   HYDROcodone-homatropine (HYCODAN) 5-1.5 MG/5ML syrup Take 5 mLs by mouth every 6 (six)  hours as needed for cough. 120 mL 0   prochlorperazine (COMPAZINE) 10 MG tablet Take 1 tablet (10 mg total) by mouth every 6 (six) hours as needed. 30 tablet 2   No current facility-administered medications for this visit.    SURGICAL HISTORY:  Past Surgical History:  Procedure Laterality Date   COLONOSCOPY W/ POLYPECTOMY     VIDEO BRONCHOSCOPY WITH ENDOBRONCHIAL ULTRASOUND N/A 09/30/2019   Procedure: VIDEO BRONCHOSCOPY WITH ENDOBRONCHIAL ULTRASOUND;  Surgeon: Collene Gobble, MD;  Location: [facility];  Service: Thoracic;  Laterality: N/A;   WISDOM TOOTH EXTRACTION  1976-77    REVIEW OF SYSTEMS:   Review of Systems  Constitutional: Negative for appetite change, chills, fatigue, fever and unexpected weight change.  HENT: Negative for mouth sores, nosebleeds, sore throat and trouble swallowing.   Eyes: Positive for worsening blurry vision. Negative for icterus.  Respiratory: Positive for slight increase in his cough. Positive for mild shortness of breath. Negative for hemoptysis wheezing.   Cardiovascular: Negative for chest pain and leg swelling.  Gastrointestinal: Positive for occassional nausea at night. Negative for abdominal pain, constipation, diarrhea, and vomiting (none at this time).  Genitourinary: Negative for bladder incontinence, difficulty urinating, dysuria, frequency and hematuria.   Musculoskeletal: Negative for back pain, gait problem, neck pain and neck stiffness.  Skin: Negative for itching and rash.  Neurological: Negative for dizziness, extremity weakness, gait problem, headaches, light-headedness and seizures.  Hematological: Negative for adenopathy. Does not bruise/bleed easily.  Psychiatric/Behavioral: Negative for confusion, depression and sleep disturbance. The patient is not nervous/anxious.  .     PHYSICAL EXAMINATION:  Blood pressure (!) 153/85, pulse (!) 102, temperature 97.8 F (36.6 C), temperature source Tympanic, resp. rate 15, height _0  (1.88 m),  weight 198 lb 8 oz (90 kg), SpO2 99 %.  ECOG PERFORMANCE STATUS: 1 - Symptomatic but completely ambulatory  Physical Exam  Constitutional: Oriented to person, place, and time and well-developed, well-nourished, and in no distress. HENT:  Head: Normocephalic and atraumatic.  Mouth/Throat: Oropharynx is clear and moist. No oropharyngeal exudate.  Eyes: Conjunctivae are normal. Right eye exhibits no discharge. Left eye exhibits no discharge. No scleral icterus.  Neck: Normal range of motion. Neck supple.  Cardiovascular: Normal rate, regular rhythm, normal heart sounds and intact distal pulses.   Pulmonary/Chest: Effort normal and breath sounds normal. No respiratory distress. No wheezes. No rales.  Abdominal: Soft. Bowel sounds are normal. Exhibits no distension and no mass. There is no tenderness.  Musculoskeletal: Normal range of motion. Exhibits no edema.  Lymphadenopathy:    No cervical adenopathy.  Neurological: Alert and oriented to person, place, and time. Exhibits normal muscle tone. Gait normal. Coordination normal.  Skin: Skin is warm and dry. No rash noted. Not diaphoretic. No erythema. No pallor.  Psychiatric: Mood, memory and judgment normal.  Vitals reviewed.   LABORATORY DATA: Lab Results  Component Value Date   WBC 6.7 03/30/2020   HGB 10.7 (L) 03/30/2020   HCT 32.6 (L) 03/30/2020   MCV 101.6 (H) 03/30/2020   PLT 222 03/30/2020  Chemistry      Component Value Date/Time   NA 142 03/30/2020 1052   K 4.3 03/30/2020 1052   CL 109 03/30/2020 1052   CO2 25 03/30/2020 1052   BUN 19 03/30/2020 1052   CREATININE 1.23 03/30/2020 1052   CREATININE 1.13 10/16/2013 1118      Component Value Date/Time   CALCIUM 9.8 03/30/2020 1052   ALKPHOS 79 03/30/2020 1052   AST 30 03/30/2020 1052   ALT 37 03/30/2020 1052   BILITOT 0.6 03/30/2020 1052       RADIOGRAPHIC STUDIES:  CT Chest W Contrast  Result Date: 03/08/2020 CLINICAL DATA:  Primary Cancer Type: Lung  Imaging Indication: Assess response to therapy Interval therapy since last imaging? Yes Initial Cancer Diagnosis Date: 09/30/2019; Established by: Biopsy-proven Detailed Pathology: Stage IV non-small cell lung cancer, adenocarcinoma. Primary Tumor location: Right upper lobe. Surgeries: No. Chemotherapy: Yes; Ongoing? Yes; Most recent administration: 02/17/2020 Immunotherapy?  Yes; Type: Keytruda; Ongoing? Yes Radiation therapy? Yes; Date Range: 11/03/2019 - 11/23/2019; Target: Right upper lung. EXAM: CT CHEST, ABDOMEN, AND PELVIS WITH CONTRAST TECHNIQUE: Multidetector CT imaging of the chest, abdomen and pelvis was performed following the standard protocol during bolus administration of intravenous contrast. CONTRAST:  157m OMNIPAQUE IOHEXOL 300 MG/ML  SOLN COMPARISON:  Most recent CT chest, abdomen and pelvis 01/04/2020. 10/26/2019 PET-CT. FINDINGS: CT CHEST FINDINGS Cardiovascular: Normal heart size. No significant pericardial effusion/thickening. Great vessels are normal in course and caliber. No central pulmonary emboli. Mediastinum/Nodes: No discrete thyroid nodules. Unremarkable esophagus. No axillary adenopathy. No new or residual pathologically enlarged mediastinal or hilar lymph nodes. Previously mildly enlarged right paratracheal, subcarinal and right hilar nodes have all decreased. Lungs/Pleura: No pneumothorax. Small dependent right pleural effusion is new. No left pleural effusion. Central right upper lobe 4.5 x 3.1 cm mass (series 3/image 39), previously 4.5 x 3.2 cm, stable, with associated volume loss and distortion. New sharply marginated patchy consolidation and ground-glass opacity in the posterior right lower lobe, compatible with evolving postradiation change. Dominant solid left lower lobe 8 mm pulmonary nodule (series 3/image 90), previously 6 mm, increased. Numerous (greater than 10) additional smaller solid pulmonary nodules scattered in both lungs, not substantially changed.  Musculoskeletal: No aggressive appearing focal osseous lesions. Minimal thoracic spondylosis. Subcutaneous loop recorder in the ventral left chest wall. CT ABDOMEN PELVIS FINDINGS Hepatobiliary: Normal liver with no liver mass. Normal gallbladder with no radiopaque cholelithiasis. No biliary ductal dilatation. Pancreas: Normal, with no mass or duct dilation. Spleen: Normal size. No mass. Adrenals/Urinary Tract: Normal adrenals. Nonobstructing 2 mm upper right and 3 mm lower left renal stones. No hydronephrosis. No renal masses. Layering 13 mm right bladder stone. Otherwise normal nondistended bladder. Stomach/Bowel: Normal non-distended stomach. Normal caliber small bowel with no small bowel wall thickening. Normal appendix. Oral contrast transits to rectum. Marked diffuse colonic diverticulosis, most prominent in the sigmoid colon, with no definite large bowel wall thickening or acute pericolonic fat stranding. Vascular/Lymphatic: Atherosclerotic nonaneurysmal abdominal aorta. Patent portal, splenic, hepatic and renal veins. No pathologically enlarged lymph nodes in the abdomen or pelvis. Reproductive: Normal size prostate. Other: No pneumoperitoneum, ascites or focal fluid collection. Musculoskeletal: No aggressive appearing focal osseous lesions. Marked lumbar spondylosis. IMPRESSION: 1. Mild mixed interval changes in the chest. 2. Central right upper lobe 4.5 cm lung mass is stable. New postradiation change in the posterior right lower lobe. New small dependent right pleural effusion. 3. Dominant solid 8 mm left lower lobe pulmonary nodule, mildly increased, suspicious for enlarging pulmonary metastasis.  Additional smaller solid pulmonary nodules scattered in both lungs, not substantially changed. 4. Decreased mediastinal and right hilar adenopathy. 5. No evidence of metastatic disease in the abdomen or pelvis. 6. Chronic findings include: Nonobstructing bilateral nephrolithiasis. Layering 13 mm right bladder  stone. Marked diffuse colonic diverticulosis. Aortic Atherosclerosis (ICD10-I70.0). Electronically Signed   By: Ilona Sorrel M.D.   On: 03/08/2020 10:27   CT Abdomen Pelvis W Contrast  Result Date: 03/08/2020 CLINICAL DATA:  Primary Cancer Type: Lung Imaging Indication: Assess response to therapy Interval therapy since last imaging? Yes Initial Cancer Diagnosis Date: 09/30/2019; Established by: Biopsy-proven Detailed Pathology: Stage IV non-small cell lung cancer, adenocarcinoma. Primary Tumor location: Right upper lobe. Surgeries: No. Chemotherapy: Yes; Ongoing? Yes; Most recent administration: 02/17/2020 Immunotherapy?  Yes; Type: Keytruda; Ongoing? Yes Radiation therapy? Yes; Date Range: 11/03/2019 - 11/23/2019; Target: Right upper lung. EXAM: CT CHEST, ABDOMEN, AND PELVIS WITH CONTRAST TECHNIQUE: Multidetector CT imaging of the chest, abdomen and pelvis was performed following the standard protocol during bolus administration of intravenous contrast. CONTRAST:  166m OMNIPAQUE IOHEXOL 300 MG/ML  SOLN COMPARISON:  Most recent CT chest, abdomen and pelvis 01/04/2020. 10/26/2019 PET-CT. FINDINGS: CT CHEST FINDINGS Cardiovascular: Normal heart size. No significant pericardial effusion/thickening. Great vessels are normal in course and caliber. No central pulmonary emboli. Mediastinum/Nodes: No discrete thyroid nodules. Unremarkable esophagus. No axillary adenopathy. No new or residual pathologically enlarged mediastinal or hilar lymph nodes. Previously mildly enlarged right paratracheal, subcarinal and right hilar nodes have all decreased. Lungs/Pleura: No pneumothorax. Small dependent right pleural effusion is new. No left pleural effusion. Central right upper lobe 4.5 x 3.1 cm mass (series 3/image 39), previously 4.5 x 3.2 cm, stable, with associated volume loss and distortion. New sharply marginated patchy consolidation and ground-glass opacity in the posterior right lower lobe, compatible with evolving  postradiation change. Dominant solid left lower lobe 8 mm pulmonary nodule (series 3/image 90), previously 6 mm, increased. Numerous (greater than 10) additional smaller solid pulmonary nodules scattered in both lungs, not substantially changed. Musculoskeletal: No aggressive appearing focal osseous lesions. Minimal thoracic spondylosis. Subcutaneous loop recorder in the ventral left chest wall. CT ABDOMEN PELVIS FINDINGS Hepatobiliary: Normal liver with no liver mass. Normal gallbladder with no radiopaque cholelithiasis. No biliary ductal dilatation. Pancreas: Normal, with no mass or duct dilation. Spleen: Normal size. No mass. Adrenals/Urinary Tract: Normal adrenals. Nonobstructing 2 mm upper right and 3 mm lower left renal stones. No hydronephrosis. No renal masses. Layering 13 mm right bladder stone. Otherwise normal nondistended bladder. Stomach/Bowel: Normal non-distended stomach. Normal caliber small bowel with no small bowel wall thickening. Normal appendix. Oral contrast transits to rectum. Marked diffuse colonic diverticulosis, most prominent in the sigmoid colon, with no definite large bowel wall thickening or acute pericolonic fat stranding. Vascular/Lymphatic: Atherosclerotic nonaneurysmal abdominal aorta. Patent portal, splenic, hepatic and renal veins. No pathologically enlarged lymph nodes in the abdomen or pelvis. Reproductive: Normal size prostate. Other: No pneumoperitoneum, ascites or focal fluid collection. Musculoskeletal: No aggressive appearing focal osseous lesions. Marked lumbar spondylosis. IMPRESSION: 1. Mild mixed interval changes in the chest. 2. Central right upper lobe 4.5 cm lung mass is stable. New postradiation change in the posterior right lower lobe. New small dependent right pleural effusion. 3. Dominant solid 8 mm left lower lobe pulmonary nodule, mildly increased, suspicious for enlarging pulmonary metastasis. Additional smaller solid pulmonary nodules scattered in both lungs,  not substantially changed. 4. Decreased mediastinal and right hilar adenopathy. 5. No evidence of metastatic disease in the abdomen or pelvis.  6. Chronic findings include: Nonobstructing bilateral nephrolithiasis. Layering 13 mm right bladder stone. Marked diffuse colonic diverticulosis. Aortic Atherosclerosis (ICD10-I70.0). Electronically Signed   By: Ilona Sorrel M.D.   On: 03/08/2020 10:27   CUP PACEART REMOTE DEVICE CHECK  Result Date: 03/17/2020 ILR summary report received. Battery status OK. Normal device function. No new symptom, tachy, brady, or pause episodes. No new AF episodes. Monthly summary reports and ROV/PRN Kathy Breach, RN, CCDS, [employer]    ASSESSMENT/PLAN:  This is a very pleasant 67 year oldCaucasianmale recently diagnosed with a stage IV (T3, N2, M1 a) non-small cell lung cancer, adenocarcinoma presented with large right upper lobe lung mass with extension to the right suprahilar region as well as right hilar and mediastinal lymphadenopathy with obstruction of the right upper lobe and bilateral pulmonary nodules diagnosed in July 2021. Negative PDL1 expression. He has no actionable mutations.   The patientcompletedpalliative radiotherapy to the obstructive lung mass under the care of Dr. Lisbeth Renshaw. The last treatmentwasscheduled for 11/23/19  He is currently undergoing palliative systemic chemotherapy with carboplatin for an AUC of 5, Alimta 500 mg/m2, and Keytruda 200 mg IV every 3 weeks. He is status post7cyclesand tolerated itwellexcept forfatigue and decreased appetite.  Starting from cycle #5, the patient has been on maintenance Alimta 500 mg per metered squared and Keytruda 200 mg IV every 3 weeks.  Labs were reviewed.  Recommend that he proceed with cycle #8 today scheduled.  We will see him back for follow-up visit in 3 weeks for evaluation before starting cycle #9.  I refilled his compazine today. I also gave the patient a written prescription  for hycodan. Hycodan has been on back order at various pharmacies, therefore, he was given the written prescription to take to any pharmacy that carries hycodan.   The patient was advised to call immediately if he has any concerning symptoms in the interval. The patient voices understanding of current disease status and treatment options and is in agreement with the current care plan. All questions were answered. The patient knows to call the clinic with any problems, questions or concerns. We can certainly see the patient much sooner if necessary             No orders of the defined types were placed in this encounter.    Hisham Provence L Alexie Lanni, PA-C 03/30/20

## 2020-03-30 ENCOUNTER — Inpatient Hospital Stay: Payer: Medicare HMO

## 2020-03-30 ENCOUNTER — Ambulatory Visit (HOSPITAL_BASED_OUTPATIENT_CLINIC_OR_DEPARTMENT_OTHER): Payer: Medicare HMO | Admitting: Medical

## 2020-03-30 ENCOUNTER — Inpatient Hospital Stay (HOSPITAL_BASED_OUTPATIENT_CLINIC_OR_DEPARTMENT_OTHER): Payer: Medicare HMO | Admitting: Physician Assistant

## 2020-03-30 ENCOUNTER — Encounter: Payer: Self-pay | Admitting: Physician Assistant

## 2020-03-30 ENCOUNTER — Other Ambulatory Visit: Payer: Self-pay

## 2020-03-30 VITALS — BP 153/85 | HR 102 | Temp 97.8°F | Resp 15 | Ht 74.0 in | Wt 198.5 lb

## 2020-03-30 VITALS — BP 137/91 | HR 81

## 2020-03-30 DIAGNOSIS — R059 Cough, unspecified: Secondary | ICD-10-CM | POA: Diagnosis not present

## 2020-03-30 DIAGNOSIS — C3411 Malignant neoplasm of upper lobe, right bronchus or lung: Secondary | ICD-10-CM

## 2020-03-30 DIAGNOSIS — Z5112 Encounter for antineoplastic immunotherapy: Secondary | ICD-10-CM | POA: Diagnosis not present

## 2020-03-30 DIAGNOSIS — T80818A Extravasation of other vesicant agent, initial encounter: Secondary | ICD-10-CM

## 2020-03-30 DIAGNOSIS — Z5111 Encounter for antineoplastic chemotherapy: Secondary | ICD-10-CM | POA: Diagnosis not present

## 2020-03-30 LAB — CMP (CANCER CENTER ONLY)
ALT: 37 U/L (ref 0–44)
AST: 30 U/L (ref 15–41)
Albumin: 3.6 g/dL (ref 3.5–5.0)
Alkaline Phosphatase: 79 U/L (ref 38–126)
Anion gap: 8 (ref 5–15)
BUN: 19 mg/dL (ref 8–23)
CO2: 25 mmol/L (ref 22–32)
Calcium: 9.8 mg/dL (ref 8.9–10.3)
Chloride: 109 mmol/L (ref 98–111)
Creatinine: 1.23 mg/dL (ref 0.61–1.24)
GFR, Estimated: >60 mL/min (ref >60)
Glucose, Bld: 148 mg/dL — ABNORMAL HIGH (ref 70–99)
Potassium: 4.3 mmol/L (ref 3.5–5.1)
Sodium: 142 mmol/L (ref 135–145)
Total Bilirubin: 0.6 mg/dL (ref 0.3–1.2)
Total Protein: 7.4 g/dL (ref 6.5–8.1)

## 2020-03-30 LAB — CBC WITH DIFFERENTIAL (CANCER CENTER ONLY)
Abs Immature Granulocytes: 0.05 10*3/uL (ref 0.00–0.07)
Basophils Absolute: 0.1 10*3/uL (ref 0.0–0.1)
Basophils Relative: 1 %
Eosinophils Absolute: 0.1 10*3/uL (ref 0.0–0.5)
Eosinophils Relative: 1 %
HCT: 32.6 % — ABNORMAL LOW (ref 39.0–52.0)
Hemoglobin: 10.7 g/dL — ABNORMAL LOW (ref 13.0–17.0)
Immature Granulocytes: 1 %
Lymphocytes Relative: 12 %
Lymphs Abs: 0.8 10*3/uL (ref 0.7–4.0)
MCH: 33.3 pg (ref 26.0–34.0)
MCHC: 32.8 g/dL (ref 30.0–36.0)
MCV: 101.6 fL — ABNORMAL HIGH (ref 80.0–100.0)
Monocytes Absolute: 0.7 10*3/uL (ref 0.1–1.0)
Monocytes Relative: 11 %
Neutro Abs: 5 10*3/uL (ref 1.7–7.7)
Neutrophils Relative %: 74 %
Platelet Count: 222 10*3/uL (ref 150–400)
RBC: 3.21 MIL/uL — ABNORMAL LOW (ref 4.22–5.81)
RDW: 16.8 % — ABNORMAL HIGH (ref 11.5–15.5)
WBC Count: 6.7 10*3/uL (ref 4.0–10.5)
nRBC: 0 % (ref 0.0–0.2)

## 2020-03-30 LAB — TSH: TSH: 1.397 u[IU]/mL (ref 0.320–4.118)

## 2020-03-30 MED ORDER — PROCHLORPERAZINE MALEATE 10 MG PO TABS
10.0000 mg | ORAL_TABLET | Freq: Once | ORAL | Status: AC
  Administered 2020-03-30: 10 mg via ORAL

## 2020-03-30 MED ORDER — PROCHLORPERAZINE MALEATE 10 MG PO TABS
ORAL_TABLET | ORAL | Status: AC
  Filled 2020-03-30: qty 1

## 2020-03-30 MED ORDER — CYANOCOBALAMIN 1000 MCG/ML IJ SOLN: 1000.0000 ug | Freq: Once | INTRAMUSCULAR | Status: DC

## 2020-03-30 MED ORDER — SODIUM CHLORIDE 0.9 % IV SOLN
200.0000 mg | Freq: Once | INTRAVENOUS | Status: AC
  Administered 2020-03-30: 200 mg via INTRAVENOUS
  Filled 2020-03-30: qty 8

## 2020-03-30 MED ORDER — SODIUM CHLORIDE 0.9 % IV SOLN
500.0000 mg/m2 | Freq: Once | INTRAVENOUS | Status: AC
  Administered 2020-03-30: 1100 mg via INTRAVENOUS
  Filled 2020-03-30: qty 40

## 2020-03-30 MED ORDER — HYDROCODONE-HOMATROPINE 5-1.5 MG/5ML PO SYRP: 5.0000 mL | ORAL_SOLUTION | Freq: Four times a day (QID) | ORAL | 0 refills | Status: DC | PRN

## 2020-03-30 MED ORDER — PROCHLORPERAZINE MALEATE 10 MG PO TABS: 10.0000 mg | ORAL_TABLET | Freq: Four times a day (QID) | ORAL | 2 refills | Status: DC | PRN

## 2020-03-30 MED ORDER — CYANOCOBALAMIN 1000 MCG/ML IJ SOLN
INTRAMUSCULAR | Status: AC
  Filled 2020-03-30: qty 1

## 2020-03-30 MED ORDER — SODIUM CHLORIDE 0.9 % IV SOLN
Freq: Once | INTRAVENOUS | Status: AC
  Filled 2020-03-30: qty 250

## 2020-03-30 NOTE — Progress Notes (Signed)
Patient just completed his Pembrolizumab and Pemetrexed infusion and was on his flush when nurse noted a facial grimace of discomfort. Patient was asked if he was okay and he verbalized "my arm has been hurting for over an hour now". On assessing nurse noted a large extravasation proximal/distal to IV insertion site. Flush was immediately stopped. IV needle removed, heat applied, arm elevated and protected from light with a towel. Physician was immediately notified. Pharmacy contacted and nurse was informed that both medications are non vesicant, non irritant and as such no protocol for intervention. Verbal education provided on importance of notifying nurse whenever there is any form of discomfort during treatment and her verbalized understanding. Patient observed for another 15 minutes after seen by physician and he verbalized he was okay to leave. Patient encouraged to call clinic with any questions or concerns that may arise after discharge and he was agreeable.

## 2020-03-30 NOTE — Patient Instructions (Signed)
[facility] Discharge Instructions for Patients Receiving Chemotherapy  Today you received the following chemotherapy agents Pembrolizumab (KEYTRUDA) & Pemetrexed (ALIMTA).  To help prevent nausea and vomiting after your treatment, we encourage you to take your nausea medication as prescribed.   If you develop nausea and vomiting that is not controlled by your nausea medication, call the clinic.   BELOW ARE SYMPTOMS THAT SHOULD BE REPORTED IMMEDIATELY:  *FEVER GREATER THAN 100.5 F  *CHILLS WITH OR WITHOUT FEVER  NAUSEA AND VOMITING THAT IS NOT CONTROLLED WITH YOUR NAUSEA MEDICATION  *UNUSUAL SHORTNESS OF BREATH  *UNUSUAL BRUISING OR BLEEDING  TENDERNESS IN MOUTH AND THROAT WITH OR WITHOUT PRESENCE OF ULCERS  *URINARY PROBLEMS  *BOWEL PROBLEMS  UNUSUAL RASH Items with * indicate a potential emergency and should be followed up as soon as possible.  Feel free to call the clinic should you have any questions or concerns. The clinic phone number is [phone].  Please show the [facility] at check-in to the Emergency Department and triage nurse.

## 2020-03-30 NOTE — Progress Notes (Signed)
   DATE:  03/30/2020      IV EXTRAVASATION (neutral)  MD:  Dr. Julien Nordmann  AGENT RECEIVED AT TIME OF EXTRAVASATION:   Alimta  IV SITE LOCATION: Right forearm  INTERVENTION:  1) IV stopped  2) 0 ml liquid/blood aspirated from IV site.  3)  Micheal Oliver was told to apply either cold packs or more heat packs to his arm several times daily.  He was told to use whichever felt better.  He does not need to return prior to his scheduled appointment given that it is a neutral agent that was infiltrated.  The patient was rather upset that according to him he is always stuck in the top of his hand or in his antecubital fossa area rather than his forearm.  He is unhappy about this.  He reports that he has been sitting there and pain for an hour however he was seen immediately after it was brought to my attention that an extravasation had taken place.  He also became somewhat unhappy when I inquired if he would like to get a port.  He adamantly stated that he did not want a port.   Review of Systems  Constitutional: Negative for chills, diaphoresis and fever.  HENT: Negative for trouble swallowing and voice change.   Respiratory: Negative for cough, chest tightness, shortness of breath and wheezing.   Cardiovascular: Negative for chest pain and palpitations.  Gastrointestinal: Negative for abdominal pain, constipation, diarrhea, nausea and vomiting.  Musculoskeletal: Negative for back pain and myalgias.  Skin:       Extravasation of Alimta is in the right forearm.  Neurological: Negative for dizziness, light-headedness and headaches.    Physical Exam Constitutional:      General: He is not in acute distress.    Appearance: He is not ill-appearing, toxic-appearing or diaphoretic.  HENT:     Head: Normocephalic and atraumatic.  Skin:    Comments: Evidence of a phlebotomy in the right lateral forearm.  Neurological:     Mental Status: He is alert.  Psychiatric:        Mood and Affect:  Affect is angry.        Behavior: Behavior is agitated.     Sandi Mealy, MHS, PA-C

## 2020-03-31 ENCOUNTER — Telehealth: Payer: Self-pay | Admitting: Physician Assistant

## 2020-03-31 NOTE — Telephone Encounter (Signed)
Scheduled appts per 12/29 los. Pt to get updated appt calendar at next visit per appt notes.  

## 2020-04-04 NOTE — Progress Notes (Signed)
[facility] Summary Report / Loop Recorder 

## 2020-04-07 ENCOUNTER — Telehealth: Payer: Self-pay

## 2020-04-07 MED ORDER — APIXABAN 5 MG PO TABS: 5.0000 mg | ORAL_TABLET | Freq: Two times a day (BID) | ORAL | 11 refills | Status: AC

## 2020-04-07 NOTE — Telephone Encounter (Signed)
ILR implanted for CVA. Alert received for AF on 04/05/20 @ 23:32, duration 4 hours 52 minutes. Patient reports he was asleep during this time. Spoke to Dr. Curt Bears, agrees that this is AF. Would to like to start patient in Eliquis 5 mg BID and STOP Plavix (started by neuro). Patient advised of changes. Explained AF and new medication management. Per Dr. Curt Bears he would like for patient to follow up with AF Clinic. Patient aware. Advised patient to call with any futher questions or concerns.

## 2020-04-07 NOTE — Telephone Encounter (Signed)
Called and spoke with patient, he is aware of appt on 04/19/2020 at 8:30 am with Adline Peals, PA.

## 2020-04-19 ENCOUNTER — Other Ambulatory Visit: Payer: Self-pay

## 2020-04-19 ENCOUNTER — Ambulatory Visit (HOSPITAL_COMMUNITY): Payer: Medicare HMO | Admitting: Physician Assistant

## 2020-04-19 ENCOUNTER — Encounter (HOSPITAL_COMMUNITY): Payer: Self-pay | Admitting: Physician Assistant

## 2020-04-19 ENCOUNTER — Ambulatory Visit (HOSPITAL_COMMUNITY)
Admission: RE | Admit: 2020-04-19 | Discharge: 2020-04-19 | Disposition: A | Discharge status: Home / Self Care | Payer: Medicare HMO | Source: Ambulatory Visit | Attending: Physician Assistant | Admitting: Physician Assistant

## 2020-04-19 VITALS — BP 132/80 | HR 111 | Ht 74.0 in | Wt 199.4 lb

## 2020-04-19 DIAGNOSIS — D6869 Other thrombophilia: Secondary | ICD-10-CM | POA: Diagnosis not present

## 2020-04-19 DIAGNOSIS — I480 Paroxysmal atrial fibrillation: Secondary | ICD-10-CM | POA: Insufficient documentation

## 2020-04-19 DIAGNOSIS — C3490 Malignant neoplasm of unspecified part of unspecified bronchus or lung: Secondary | ICD-10-CM | POA: Insufficient documentation

## 2020-04-19 DIAGNOSIS — Z79899 Other long term (current) drug therapy: Secondary | ICD-10-CM | POA: Insufficient documentation

## 2020-04-19 DIAGNOSIS — I10 Essential (primary) hypertension: Secondary | ICD-10-CM | POA: Diagnosis not present

## 2020-04-19 DIAGNOSIS — Z8249 Family history of ischemic heart disease and other diseases of the circulatory system: Secondary | ICD-10-CM | POA: Diagnosis not present

## 2020-04-19 DIAGNOSIS — Z7901 Long term (current) use of anticoagulants: Secondary | ICD-10-CM | POA: Insufficient documentation

## 2020-04-19 NOTE — Progress Notes (Signed)
Primary Care Physician: Darreld Mclean, MD Primary Electrophysiologist: Dr Curt Bears Referring Physician: Dr Reggy Eye Micheal Oliver is a 70 y.o. male with a history of DM, HTN, HLD, prior CVA, lung cancer, and atrial fibrillation who presents for follow up in the [facility]. Patient had a cryptogenic stroke 09/2019 and underwent ILR implant on 12/2019 with Dr Curt Bears. The patient was initially diagnosed with atrial fibrillation 04/07/20 on ILR with a ~ 5 hour episode. This occurred during the night while the patient was asleep. Patient was started on Eliquis for a CHADS2VASC score of 5.  Today, he denies symptoms of palpitations, chest pain, shortness of breath, orthopnea, PND, lower extremity edema, dizziness, presyncope, syncope, snoring, daytime somnolence, bleeding, or neurologic sequela. The patient is tolerating medications without difficulties and is otherwise without complaint today.    Atrial Fibrillation Risk Factors:  he does not have symptoms or diagnosis of sleep apnea. he does not have a history of rheumatic fever. he does not have a history of alcohol use. The patient does not have a history of early familial atrial fibrillation or other arrhythmias.  he has a BMI of Body mass index is 25.6 kg/m.Marland Kitchen Filed Weights   04/19/20 1139  Weight: 90.4 kg    Family History  Problem Relation Age of Onset  . Alcohol abuse Father 48       Deceased  . Heart disease Father   . Lung disease Father   . Hypertension Father   . Stomach cancer Mother 34       Deceased  . Hypertension Paternal Grandmother   . Dementia Paternal Grandmother   . Stroke Paternal Grandmother   . Arthritis Maternal Grandmother   . Sudden death Paternal Grandfather   . Sudden death Maternal Grandfather   . Heart attack Paternal Aunt   . Other Maternal Aunt        Natural Causes  . Healthy Son   . Anxiety disorder Daughter      Atrial Fibrillation Management  history:  Previous antiarrhythmic drugs: none Previous cardioversions: none Previous ablations: none CHADS2VASC score: 5 Anticoagulation history: Eliquis   Past Medical History:  Diagnosis Date  . Chicken pox   . DM2 (diabetes mellitus, type 2) ([place])   . History of kidney stones    passed several   . Hypertension   . Kidney stones    5-6 times  . Measles   . Mumps   . Stroke ([place])    x 2, last one was 09/18/2019- has some expressive aphasia   Past Surgical History:  Procedure Laterality Date  . COLONOSCOPY W/ POLYPECTOMY    . VIDEO BRONCHOSCOPY WITH ENDOBRONCHIAL ULTRASOUND N/A 09/30/2019   Procedure: VIDEO BRONCHOSCOPY WITH ENDOBRONCHIAL ULTRASOUND;  Surgeon: Collene Gobble, MD;  Location: [facility];  Service: Thoracic;  Laterality: N/A;  . WISDOM TOOTH EXTRACTION  1976-77    Current Outpatient Medications  Medication Sig Dispense Refill  . acetaminophen (TYLENOL) 500 MG tablet Take 500 mg by mouth every 6 (six) hours as needed.     Marland Kitchen apixaban (ELIQUIS) 5 MG TABS tablet Take 1 tablet (5 mg total) by mouth 2 (two) times daily. 60 tablet 11  . atorvastatin (LIPITOR) 40 MG tablet Take 1 tablet (40 mg total) by mouth daily at 6 PM. 90 tablet 3  . folic acid (FOLVITE) 1 MG tablet Take 1 tablet (1 mg total) by mouth daily. 90 tablet 3  . gabapentin (NEURONTIN) 300 MG  capsule Take 1 capsule (300 mg total) by mouth at bedtime. 90 capsule 3  . HYDROcodone-homatropine (HYCODAN) 5-1.5 MG/5ML syrup Take 5 mLs by mouth every 6 (six) hours as needed for cough. 120 mL 0  . metFORMIN (GLUCOPHAGE) 500 MG tablet TAKE TWO TABLETS BY MOUTH TWICE A DAY WITH MEAL(S) 360 tablet 3  . Omega-3 Fatty Acids (FISH OIL) 1200 MG CAPS Take by mouth daily.    . prochlorperazine (COMPAZINE) 10 MG tablet Take 1 tablet (10 mg total) by mouth every 6 (six) hours as needed. 30 tablet 2  . sitaGLIPtin (JANUVIA) 100 MG tablet Take 1 tablet (100 mg total) by mouth daily. 90 tablet 3   No current facility-administered  medications for this encounter.    Allergies  Allergen Reactions  . Bee Venom     Lip swelling    Social History   Socioeconomic History  . Marital status: Married    Spouse name: [name]  . Number of children: 2  . Years of education: MBA  . Highest education level: Not on file  Occupational History    Comment: retired, Banker   Tobacco Use  . Smoking status: Never Smoker  . Smokeless tobacco: Never Used  Vaping Use  . Vaping Use: Never used  Substance and Sexual Activity  . Alcohol use: No  . Drug use: No  . Sexual activity: Yes  Other Topics Concern  . Not on file  Social History Narrative   Lives at home with wife   Caffeine use- 15-20oz per day   Right handed    Social Determinants of Health   Financial Resource Strain: Not on file  Food Insecurity: Not on file  Transportation Needs: Not on file  Physical Activity: Not on file  Stress: Not on file  Social Connections: Not on file  Intimate Partner Violence: Not on file     ROS- All systems are reviewed and negative except as per the HPI above.  Physical Exam: Vitals:   04/19/20 1139  BP: 132/80  Pulse: (!) 111  Weight: 90.4 kg  Height: 6\' 2"  (1.88 m)    GEN- The patient is well appearing, alert and oriented x 3 today.   Head- normocephalic, atraumatic Eyes-  Sclera clear, conjunctiva pink Ears- hearing intact Oropharynx- clear Neck- supple  Lungs- Clear to ausculation bilaterally, normal work of breathing Heart- Regular rate and rhythm, no murmurs, rubs or gallops  GI- soft, NT, ND, + BS Extremities- no clubbing, cyanosis, or edema MS- no significant deformity or atrophy Skin- no rash or lesion Psych- euthymic mood, full affect Neuro- strength and sensation are intact  Wt Readings from Last 3 Encounters:  04/19/20 90.4 kg  03/30/20 90 kg  03/24/20 88.9 kg    EKG today demonstrates  Sinus tach Vent. rate 111 BPM PR interval 156 ms QRS duration 84 ms QT/QTc 330/448 ms  Echo  09/19/19 demonstrated  1. Left ventricular ejection fraction, by estimation, is 60 to 65%. The  left ventricle has normal function. The left ventricle has no regional  wall motion abnormalities. There is moderate concentric left ventricular  hypertrophy. Left ventricular  diastolic parameters are consistent with Grade I diastolic dysfunction  (impaired relaxation).  2. Right ventricular systolic function is normal. The right ventricular  size is normal.  3. Left atrial size was mildly dilated.  4. The mitral valve is normal in structure. Mild mitral valve  regurgitation. No evidence of mitral stenosis.  5. The aortic valve is normal in structure.  Aortic valve regurgitation is  trivial. No aortic stenosis is present.  6. The inferior vena cava is normal in size with greater than 50%  respiratory variability, suggesting right atrial pressure of 3 mmHg.   Conclusion(s)/Recommendation(s): No intracardiac source of embolism  detected on this transthoracic study. A transesophageal echocardiogram is  recommended to exclude cardiac source of embolism if clinically indicated.   Epic records are reviewed at length today  CHA2DS2-VASc Score = 5  The patient's score is based upon: CHF History: No HTN History: Yes Diabetes History: Yes Stroke History: Yes Vascular Disease History: No Age Score: 1 Gender Score: 0      ASSESSMENT AND PLAN: 1. Paroxysmal Atrial Fibrillation (ICD10:  I48.0) The patient's CHA2DS2-VASc score is 5, indicating a 7.2% annual risk of stroke.   General education about afib provided and questions answered. We also discussed his stroke risk and the risks and benefits of anticoagulation. Continue Eliquis 5 mg BID. Due for lab work tomorrow with cancer center.  Will not start rate control at this point as his median V rate was 80s. Can consider if he develops symptomatic afib.  2. Secondary Hypercoagulable State (ICD10:  D68.69) The patient is at significant risk  for stroke/thromboembolism based upon his CHA2DS2-VASc Score of 5.  Continue Apixaban (Eliquis).   3. HTN Stable, no changes today.  4. Lung cancer Stage IV, followed by Dr Julien Nordmann.   Follow up in the AF clinic in 3 months.    [facility] [address] [place], [place] [address] [phone] 04/19/2020 11:45 AM

## 2020-04-20 ENCOUNTER — Encounter: Payer: Self-pay | Admitting: Internal Medicine

## 2020-04-20 ENCOUNTER — Inpatient Hospital Stay (HOSPITAL_BASED_OUTPATIENT_CLINIC_OR_DEPARTMENT_OTHER): Payer: Medicare HMO | Admitting: Internal Medicine

## 2020-04-20 ENCOUNTER — Inpatient Hospital Stay: Payer: Medicare HMO | Attending: Internal Medicine

## 2020-04-20 ENCOUNTER — Inpatient Hospital Stay: Payer: Medicare HMO

## 2020-04-20 ENCOUNTER — Other Ambulatory Visit: Payer: Self-pay

## 2020-04-20 VITALS — BP 145/87 | HR 98 | Temp 97.1°F | Resp 16 | Ht 74.0 in | Wt 198.6 lb

## 2020-04-20 DIAGNOSIS — Z79899 Other long term (current) drug therapy: Secondary | ICD-10-CM | POA: Diagnosis not present

## 2020-04-20 DIAGNOSIS — C3411 Malignant neoplasm of upper lobe, right bronchus or lung: Secondary | ICD-10-CM

## 2020-04-20 DIAGNOSIS — Z5111 Encounter for antineoplastic chemotherapy: Secondary | ICD-10-CM | POA: Diagnosis not present

## 2020-04-20 DIAGNOSIS — C3490 Malignant neoplasm of unspecified part of unspecified bronchus or lung: Secondary | ICD-10-CM | POA: Diagnosis not present

## 2020-04-20 DIAGNOSIS — Z5112 Encounter for antineoplastic immunotherapy: Secondary | ICD-10-CM

## 2020-04-20 LAB — TSH: TSH: 1.604 u[IU]/mL (ref 0.320–4.118)

## 2020-04-20 LAB — CMP (CANCER CENTER ONLY)
ALT: 35 U/L (ref 0–44)
AST: 46 U/L — ABNORMAL HIGH (ref 15–41)
Albumin: 3.6 g/dL (ref 3.5–5.0)
Alkaline Phosphatase: 77 U/L (ref 38–126)
Anion gap: 13 (ref 5–15)
BUN: 19 mg/dL (ref 8–23)
CO2: 24 mmol/L (ref 22–32)
Calcium: 9.7 mg/dL (ref 8.9–10.3)
Chloride: 105 mmol/L (ref 98–111)
Creatinine: 1.31 mg/dL — ABNORMAL HIGH (ref 0.61–1.24)
GFR, Estimated: 59 mL/min — ABNORMAL LOW (ref >60)
Glucose, Bld: 137 mg/dL — ABNORMAL HIGH (ref 70–99)
Potassium: 4.5 mmol/L (ref 3.5–5.1)
Sodium: 142 mmol/L (ref 135–145)
Total Bilirubin: 0.5 mg/dL (ref 0.3–1.2)
Total Protein: 7.6 g/dL (ref 6.5–8.1)

## 2020-04-20 LAB — CBC WITH DIFFERENTIAL (CANCER CENTER ONLY)
Abs Immature Granulocytes: 0.09 10*3/uL — ABNORMAL HIGH (ref 0.00–0.07)
Basophils Absolute: 0.1 10*3/uL (ref 0.0–0.1)
Basophils Relative: 1 %
Eosinophils Absolute: 0.1 10*3/uL (ref 0.0–0.5)
Eosinophils Relative: 1 %
HCT: 31.3 % — ABNORMAL LOW (ref 39.0–52.0)
Hemoglobin: 10.5 g/dL — ABNORMAL LOW (ref 13.0–17.0)
Immature Granulocytes: 1 %
Lymphocytes Relative: 12 %
Lymphs Abs: 0.8 10*3/uL (ref 0.7–4.0)
MCH: 34.1 pg — ABNORMAL HIGH (ref 26.0–34.0)
MCHC: 33.5 g/dL (ref 30.0–36.0)
MCV: 101.6 fL — ABNORMAL HIGH (ref 80.0–100.0)
Monocytes Absolute: 0.8 10*3/uL (ref 0.1–1.0)
Monocytes Relative: 12 %
Neutro Abs: 4.6 10*3/uL (ref 1.7–7.7)
Neutrophils Relative %: 73 %
Platelet Count: 235 10*3/uL (ref 150–400)
RBC: 3.08 MIL/uL — ABNORMAL LOW (ref 4.22–5.81)
RDW: 15.4 % (ref 11.5–15.5)
WBC Count: 6.3 10*3/uL (ref 4.0–10.5)
nRBC: 0 % (ref 0.0–0.2)

## 2020-04-20 MED ORDER — CYANOCOBALAMIN 1000 MCG/ML IJ SOLN
1000.0000 ug | Freq: Once | INTRAMUSCULAR | Status: AC
  Administered 2020-04-20: 1000 ug via INTRAMUSCULAR

## 2020-04-20 MED ORDER — PROCHLORPERAZINE MALEATE 10 MG PO TABS
ORAL_TABLET | ORAL | Status: AC
  Filled 2020-04-20: qty 1

## 2020-04-20 MED ORDER — SODIUM CHLORIDE 0.9 % IV SOLN
Freq: Once | INTRAVENOUS | Status: AC
  Filled 2020-04-20: qty 250

## 2020-04-20 MED ORDER — PROCHLORPERAZINE MALEATE 10 MG PO TABS
10.0000 mg | ORAL_TABLET | Freq: Once | ORAL | Status: AC
Start: 2020-04-20 — End: 2020-04-20
  Administered 2020-04-20: 10 mg via ORAL

## 2020-04-20 MED ORDER — SODIUM CHLORIDE 0.9 % IV SOLN
500.0000 mg/m2 | Freq: Once | INTRAVENOUS | Status: AC
  Administered 2020-04-20: 1100 mg via INTRAVENOUS
  Filled 2020-04-20: qty 40

## 2020-04-20 MED ORDER — SODIUM CHLORIDE 0.9 % IV SOLN
200.0000 mg | Freq: Once | INTRAVENOUS | Status: AC
  Administered 2020-04-20: 200 mg via INTRAVENOUS
  Filled 2020-04-20: qty 8

## 2020-04-20 MED ORDER — CYANOCOBALAMIN 1000 MCG/ML IJ SOLN
INTRAMUSCULAR | Status: AC
  Filled 2020-04-20: qty 1

## 2020-04-20 NOTE — Progress Notes (Signed)
[facility] [phone]   [phone]  OFFICE PROGRESS NOTE  Oliver, Micheal Filler, MD [address] [place] [address]  DIAGNOSIS: stage IV (T3, N2, M1a) non-small cell lung cancer, adenocarcinoma presented with large right upper lobe lung mass extending to the right suprahilar region in addition to right hilar and mediastinal lymphadenopathy with obstruction of the associated upper lobe bronchus and bilateral subcentimeter pulmonary nodules diagnosed in July 2021.  Biomarker Findings Microsatellite status - MS-Stable Tumor Mutational Burden - 3 Muts/Mb Genomic Findings For a complete list of the genes assayed, please refer to the Appendix. ERBB2 [id number], amplification - equivocal? HGF amplification - equivocal? MTAP loss exons 5-8 MYC amplification - equivocal? CDKN2A/B CDKN2B loss, CDKN2A loss CUL3 Y28f*19 RPTOR amplification TP53 [id number] 7 Disease relevant genes with no reportable alterations: ALK, BRAF, EGFR, KRAS, MET, RET, ROS1  PDL1 Expression: Negative  PRIOR THERAPY: Palliative radiotherapy to the obstructive lung mass under the care of Dr. MoLisbeth RenshawThe past treatment is scheduled for8/23/21.  CURRENT THERAPY: Systemic chemotherapy with carboplatin for AUC of 5, Alimta 500 mg/M2 and Keytruda 200 mg IV every 3 weeks.  First dose November 04, 2019.  Status post 8 cycles.  Starting from cycle #5 he will be on maintenance treatment with Alimta and Keytruda every 3 weeks.  INTERVAL HISTORY: [name]89.o. male returns to the clinic today for follow-up visit.  The patient is feeling fine today with no concerning complaints except for fatigue.  He had an episode of atrial fibrillation 2 weeks ago and he was seen by his cardiologist and switched his treatment from Plavix to Eliquis.  The patient denied having any current chest pain but has shortness of breath with exertion with no cough or hemoptysis.  He  denied having any fever or chills.  He has no nausea, vomiting, diarrhea or constipation.  He denied having any significant weight loss or night sweats.  He is here today for evaluation before starting cycle #9 of his treatment.  MEDICAL HISTORY: Past Medical History:  Diagnosis Date  . Chicken pox   . DM2 (di[place], [place] 2) [place]  . History of kidney stones    passed several   . Hypertension   . Kidney stones    5-6 times  . Measles   . Mumps   . [place]   x 2, last one was 09/18/2019- has some expressive aphasia    ALLERGIES:  is allergic to bee venom.  MEDICATIONS:  Current Outpatient Medications  Medication Sig Dispense Refill  . acetaminophen (TYLENOL) 500 MG tablet Take 500 mg by mouth every 6 (six) hours as needed.     . Marland Kitchenpixaban (ELIQUIS) 5 MG TABS tablet Take 1 tablet (5 mg total) by mouth 2 (two) times daily. 60 tablet 11  . atorvastatin (LIPITOR) 40 MG tablet Take 1 tablet (40 mg total) by mouth daily at 6 PM. 90 tablet 3  . folic acid (FOLVITE) 1 MG tablet Take 1 tablet (1 mg total) by mouth daily. 90 tablet 3  . gabapentin (NEURONTIN) 300 MG capsule Take 1 capsule (300 mg total) by mouth at bedtime. 90 capsule 3  . HYDROcodone-homatropine (HYCODAN) 5-1.5 MG/5ML syrup Take 5 mLs by mouth every 6 (six) hours as needed for cough. 120 mL 0  . metFORMIN (GLUCOPHAGE) 500 MG tablet TAKE TWO TABLETS BY MOUTH TWICE A DAY WITH MEAL(S) 360 tablet 3  . Omega-3 Fatty Acids (  FISH OIL) 1200 MG CAPS Take by mouth daily.    . prochlorperazine (COMPAZINE) 10 MG tablet Take 1 tablet (10 mg total) by mouth every 6 (six) hours as needed. 30 tablet 2  . sitaGLIPtin (JANUVIA) 100 MG tablet Take 1 tablet (100 mg total) by mouth daily. 90 tablet 3   No current facility-administered medications for this visit.    SURGICAL HISTORY:  Past Surgical History:  Procedure Laterality Date  . COLONOSCOPY W/ POLYPECTOMY    . VIDEO BRONCHOSCOPY WITH ENDOBRONCHIAL ULTRASOUND N/A  09/30/2019   Procedure: VIDEO BRONCHOSCOPY WITH ENDOBRONCHIAL ULTRASOUND;  Surgeon: Micheal Gobble, MD;  Location: [facility];  Service: Thoracic;  Laterality: N/A;  . WISDOM TOOTH EXTRACTION  1976-77    REVIEW OF SYSTEMS:  A comprehensive review of systems was negative except for: Constitutional: positive for fatigue Respiratory: positive for dyspnea on exertion   PHYSICAL EXAMINATION: General appearance: alert, cooperative, fatigued and no distress Head: Normocephalic, without obvious abnormality, atraumatic Neck: no adenopathy, no JVD, supple, symmetrical, trachea midline and thyroid not enlarged, symmetric, no tenderness/mass/nodules Lymph nodes: Cervical, supraclavicular, and axillary nodes normal. Resp: clear to auscultation bilaterally Back: symmetric, no curvature. ROM normal. No CVA tenderness. Cardio: regular rate and rhythm, S1, S2 normal, no murmur, click, rub or gallop GI: soft, non-tender; bowel sounds normal; no masses,  no organomegaly Extremities: extremities normal, atraumatic, no cyanosis or edema  ECOG PERFORMANCE STATUS: 1 - Symptomatic but completely ambulatory  Blood pressure (!) 145/87, pulse 98, temperature (!) 97.1 F (36.2 C), temperature source Tympanic, resp. rate 16, height _0  (1.88 m), weight 198 lb 9.6 oz (90.1 kg), SpO2 100 %.  LABORATORY DATA: Lab Results  Component Value Date   WBC 6.3 04/20/2020   HGB 10.5 (L) 04/20/2020   HCT 31.3 (L) 04/20/2020   MCV 101.6 (H) 04/20/2020   PLT 235 04/20/2020      Chemistry      Component Value Date/Time   NA 142 03/30/2020 1052   K 4.3 03/30/2020 1052   CL 109 03/30/2020 1052   CO2 25 03/30/2020 1052   BUN 19 03/30/2020 1052   CREATININE 1.23 03/30/2020 1052   CREATININE 1.13 10/16/2013 1118      Component Value Date/Time   CALCIUM 9.8 03/30/2020 1052   ALKPHOS 79 03/30/2020 1052   AST 30 03/30/2020 1052   ALT 37 03/30/2020 1052   BILITOT 0.6 03/30/2020 1052       RADIOGRAPHIC STUDIES: No  results found.  ASSESSMENT AND PLAN: This is a very pleasant 70 years old white male recently diagnosed with a stage IV (T3, N2, M1 a) non-small cell lung cancer, adenocarcinoma presented with large right upper lobe lung mass with extension to the right suprahilar region as well as right hilar and mediastinal lymphadenopathy with obstruction of the right upper lobe and bilateral pulmonary nodules diagnosed in July 2021. The molecular study shows no actionable mutations and PD-L1 expression is negative. He had a short course of palliative radiotherapy to the obstructive lung mass under the care of Dr. Lisbeth Oliver. The patient is currently undergoing systemic chemotherapy with carboplatin for AUC of 5, Alimta 500 mg/M2 and Keytruda 200 mg IV every 3 weeks status post 8 cycles.  Starting from cycle #5 he will be on maintenance treatment with Alimta and Keytruda every 3 weeks The patient has been tolerating this treatment well with no concerning adverse effects except for fatigue and mild shortness of breath. I recommended for him to proceed with cycle #9  today as planned. I will see him back for follow-up visit in 3 weeks for evaluation with repeat CT scan of the chest, abdomen pelvis for restaging of his disease. The patient was advised to call immediately if he has any other concerning symptoms in the interval. For the thyroid malignancy, he was referred to Dr. Harlow Asa for evaluation and surgical intervention.  The patient voices understanding of current disease status and treatment options and is in agreement with the current care plan.  All questions were answered. The patient knows to call the clinic with any problems, questions or concerns. We can certainly see the patient much sooner if necessary.  Disclaimer: This note was dictated with voice recognition software. Similar sounding words can inadvertently be transcribed and may not be corrected upon review.

## 2020-04-20 NOTE — Patient Instructions (Signed)
[facility] Discharge Instructions for Patients Receiving Chemotherapy  Today you received the following chemotherapy agents Pembrolizumab (KEYTRUDA) & Pemetrexed (ALIMTA).  To help prevent nausea and vomiting after your treatment, we encourage you to take your nausea medication as prescribed.   If you develop nausea and vomiting that is not controlled by your nausea medication, call the clinic.   BELOW ARE SYMPTOMS THAT SHOULD BE REPORTED IMMEDIATELY:  *FEVER GREATER THAN 100.5 F  *CHILLS WITH OR WITHOUT FEVER  NAUSEA AND VOMITING THAT IS NOT CONTROLLED WITH YOUR NAUSEA MEDICATION  *UNUSUAL SHORTNESS OF BREATH  *UNUSUAL BRUISING OR BLEEDING  TENDERNESS IN MOUTH AND THROAT WITH OR WITHOUT PRESENCE OF ULCERS  *URINARY PROBLEMS  *BOWEL PROBLEMS  UNUSUAL RASH Items with * indicate a potential emergency and should be followed up as soon as possible.  Feel free to call the clinic should you have any questions or concerns. The clinic phone number is [phone].  Please show the [facility] at check-in to the Emergency Department and triage nurse.

## 2020-04-22 LAB — CUP PACEART REMOTE DEVICE CHECK
Date Time Interrogation Session: 20220118120817
Implantable Pulse Generator Implant Date: 20210907

## 2020-04-25 ENCOUNTER — Encounter: Payer: Self-pay | Admitting: Internal Medicine

## 2020-04-25 ENCOUNTER — Telehealth: Payer: Self-pay | Admitting: Internal Medicine

## 2020-04-25 ENCOUNTER — Ambulatory Visit (INDEPENDENT_AMBULATORY_CARE_PROVIDER_SITE_OTHER): Payer: Medicare HMO

## 2020-04-25 DIAGNOSIS — I639 Cerebral infarction, unspecified: Secondary | ICD-10-CM

## 2020-04-25 NOTE — Telephone Encounter (Signed)
Per 1/19 los, pt is a;ready scheduled for next appts

## 2020-04-26 ENCOUNTER — Telehealth: Payer: Self-pay | Admitting: Physician Assistant

## 2020-04-26 LAB — CUP PACEART REMOTE DEVICE CHECK
Date Time Interrogation Session: 20220125001917
Implantable Pulse Generator Implant Date: 20210907

## 2020-04-26 NOTE — Telephone Encounter (Signed)
ILR for AF, ongoing with onset 04/25/20, rates ok (although higher than average). [place]- Eliquis. Routing for further review of persistent AF.  Reviewed  presenting rhythm is rate controlled AF Episode detail looks like perhaps caurse AFib vs AFlutter, some appear to have sinus beats as well. Rates low 100's pt known AFib recently started on Eliquis.  Called patient to discuss and assess any symptoms.  Left a message with DC direct number. Recommend clinic visit with AFib clinic or office in the next couple days if asymptomatic, to assess rate and or rhythm control needs  Tommye Standard, PA-C

## 2020-04-27 ENCOUNTER — Encounter (HOSPITAL_COMMUNITY): Payer: Self-pay

## 2020-04-27 NOTE — Telephone Encounter (Signed)
Patient returned phone call.  States on 1/24 he did have several episodes of dizziness.   Per AF clinic notes on 1/18- "Will not start rate control at this point as his median V rate was 80s. Can consider if he develops symptomatic afib."   Advised pt I would forward info to AF clinic to determine if this current episode changes plan.

## 2020-04-28 NOTE — Telephone Encounter (Signed)
Pt will call if frequency continues. Will continue to monitor for now.

## 2020-05-05 NOTE — Progress Notes (Signed)
[facility] Summary Report / Loop Recorder 

## 2020-05-10 ENCOUNTER — Encounter (HOSPITAL_COMMUNITY): Payer: Self-pay

## 2020-05-10 ENCOUNTER — Other Ambulatory Visit: Payer: Self-pay

## 2020-05-10 ENCOUNTER — Ambulatory Visit (HOSPITAL_COMMUNITY)
Admission: RE | Admit: 2020-05-10 | Discharge: 2020-05-10 | Disposition: A | Discharge status: Home / Self Care | Payer: Medicare HMO | Source: Ambulatory Visit | Attending: Internal Medicine | Admitting: Internal Medicine

## 2020-05-10 DIAGNOSIS — C3490 Malignant neoplasm of unspecified part of unspecified bronchus or lung: Secondary | ICD-10-CM | POA: Diagnosis not present

## 2020-05-10 MED ORDER — IOHEXOL 350 MG/ML SOLN: 100.0000 mL | Freq: Once | INTRAVENOUS | Status: DC | PRN

## 2020-05-10 MED ORDER — IOHEXOL 300 MG/ML  SOLN
100.0000 mL | Freq: Once | INTRAMUSCULAR | Status: AC | PRN
  Administered 2020-05-10: 100 mL via INTRAVENOUS

## 2020-05-11 ENCOUNTER — Inpatient Hospital Stay: Payer: Medicare HMO | Attending: Internal Medicine | Admitting: Internal Medicine

## 2020-05-11 ENCOUNTER — Inpatient Hospital Stay: Payer: Medicare HMO

## 2020-05-11 ENCOUNTER — Other Ambulatory Visit: Payer: Self-pay

## 2020-05-11 ENCOUNTER — Encounter: Payer: Self-pay | Admitting: Internal Medicine

## 2020-05-11 VITALS — BP 155/85 | HR 90 | Temp 97.8°F | Resp 20 | Ht 74.0 in | Wt 199.6 lb

## 2020-05-11 DIAGNOSIS — I4819 Other persistent atrial fibrillation: Secondary | ICD-10-CM | POA: Diagnosis not present

## 2020-05-11 DIAGNOSIS — I10 Essential (primary) hypertension: Secondary | ICD-10-CM | POA: Insufficient documentation

## 2020-05-11 DIAGNOSIS — Z79899 Other long term (current) drug therapy: Secondary | ICD-10-CM | POA: Diagnosis not present

## 2020-05-11 DIAGNOSIS — Z5111 Encounter for antineoplastic chemotherapy: Secondary | ICD-10-CM | POA: Insufficient documentation

## 2020-05-11 DIAGNOSIS — C3411 Malignant neoplasm of upper lobe, right bronchus or lung: Secondary | ICD-10-CM | POA: Insufficient documentation

## 2020-05-11 DIAGNOSIS — Z7984 Long term (current) use of oral hypoglycemic drugs: Secondary | ICD-10-CM | POA: Diagnosis not present

## 2020-05-11 DIAGNOSIS — R059 Cough, unspecified: Secondary | ICD-10-CM

## 2020-05-11 DIAGNOSIS — E119 Type 2 diabetes mellitus without complications: Secondary | ICD-10-CM | POA: Diagnosis not present

## 2020-05-11 DIAGNOSIS — Z7901 Long term (current) use of anticoagulants: Secondary | ICD-10-CM | POA: Insufficient documentation

## 2020-05-11 DIAGNOSIS — Z8673 Personal history of transient ischemic attack (TIA), and cerebral infarction without residual deficits: Secondary | ICD-10-CM | POA: Diagnosis not present

## 2020-05-11 DIAGNOSIS — Z7952 Long term (current) use of systemic steroids: Secondary | ICD-10-CM | POA: Insufficient documentation

## 2020-05-11 DIAGNOSIS — Z5112 Encounter for antineoplastic immunotherapy: Secondary | ICD-10-CM | POA: Insufficient documentation

## 2020-05-11 DIAGNOSIS — Z5189 Encounter for other specified aftercare: Secondary | ICD-10-CM | POA: Diagnosis not present

## 2020-05-11 LAB — TSH: TSH: 1.746 u[IU]/mL (ref 0.320–4.118)

## 2020-05-11 LAB — CBC WITH DIFFERENTIAL (CANCER CENTER ONLY)
Abs Immature Granulocytes: 0.06 10*3/uL (ref 0.00–0.07)
Basophils Absolute: 0.1 10*3/uL (ref 0.0–0.1)
Basophils Relative: 1 %
Eosinophils Absolute: 0.1 10*3/uL (ref 0.0–0.5)
Eosinophils Relative: 1 %
HCT: 30.3 % — ABNORMAL LOW (ref 39.0–52.0)
Hemoglobin: 9.9 g/dL — ABNORMAL LOW (ref 13.0–17.0)
Immature Granulocytes: 1 %
Lymphocytes Relative: 15 %
Lymphs Abs: 0.8 10*3/uL (ref 0.7–4.0)
MCH: 33.7 pg (ref 26.0–34.0)
MCHC: 32.7 g/dL (ref 30.0–36.0)
MCV: 103.1 fL — ABNORMAL HIGH (ref 80.0–100.0)
Monocytes Absolute: 0.8 10*3/uL (ref 0.1–1.0)
Monocytes Relative: 15 %
Neutro Abs: 3.5 10*3/uL (ref 1.7–7.7)
Neutrophils Relative %: 67 %
Platelet Count: 223 10*3/uL (ref 150–400)
RBC: 2.94 MIL/uL — ABNORMAL LOW (ref 4.22–5.81)
RDW: 15.1 % (ref 11.5–15.5)
WBC Count: 5.3 10*3/uL (ref 4.0–10.5)
nRBC: 0 % (ref 0.0–0.2)

## 2020-05-11 LAB — CMP (CANCER CENTER ONLY)
ALT: 31 U/L (ref 0–44)
AST: 30 U/L (ref 15–41)
Albumin: 3.5 g/dL (ref 3.5–5.0)
Alkaline Phosphatase: 83 U/L (ref 38–126)
Anion gap: 9 (ref 5–15)
BUN: 18 mg/dL (ref 8–23)
CO2: 25 mmol/L (ref 22–32)
Calcium: 9.2 mg/dL (ref 8.9–10.3)
Chloride: 106 mmol/L (ref 98–111)
Creatinine: 1.14 mg/dL (ref 0.61–1.24)
GFR, Estimated: >60 mL/min (ref >60)
Glucose, Bld: 107 mg/dL — ABNORMAL HIGH (ref 70–99)
Potassium: 4.5 mmol/L (ref 3.5–5.1)
Sodium: 140 mmol/L (ref 135–145)
Total Bilirubin: 0.6 mg/dL (ref 0.3–1.2)
Total Protein: 7.1 g/dL (ref 6.5–8.1)

## 2020-05-11 MED ORDER — HYDROCODONE-HOMATROPINE 5-1.5 MG/5ML PO SYRP: 5.0000 mL | ORAL_SOLUTION | Freq: Four times a day (QID) | ORAL | 0 refills | Status: DC | PRN

## 2020-05-11 MED ORDER — DEXAMETHASONE 4 MG PO TABS: ORAL_TABLET | ORAL | 1 refills | Status: DC

## 2020-05-11 NOTE — Progress Notes (Signed)
[facility] [phone]   [phone]) [phone]  OFFICE PROGRESS NOTE  Copland, Gay Filler, MD [address] [place] [address]  DIAGNOSIS: Stage IV (T3, N2, M1a) non-small cell lung cancer, adenocarcinoma presented with large right upper lobe lung mass extending to the right suprahilar region in addition to right hilar and mediastinal lymphadenopathy with obstruction of the associated upper lobe bronchus and bilateral subcentimeter pulmonary nodules diagnosed in July 2021.  Biomarker Findings Microsatellite status - MS-Stable Tumor Mutational Burden - 3 Muts/Mb Genomic Findings For a complete list of the genes assayed, please refer to the Appendix. ERBB2 [id number], amplification - equivocal? HGF amplification - equivocal? MTAP loss exons 5-8 MYC amplification - equivocal? CDKN2A/B CDKN2B loss, CDKN2A loss CUL3 Y77f*19 RPTOR amplification TP53 [id number] 7 Disease relevant genes with no reportable alterations: ALK, BRAF, EGFR, KRAS, MET, RET, ROS1  PDL1 Expression: Negative  PRIOR THERAPY:  1) Palliative radiotherapy to the obstructive lung mass under the care of Dr. MoLisbeth RenshawThe past treatment is scheduled for8/23/21. 2) Systemic chemotherapy with carboplatin for AUC of 5, Alimta 500 mg/M2 and Keytruda 200 mg IV every 3 weeks.  First dose November 04, 2019.  Status post 9 cycles.  Starting from cycle #5 he will be on maintenance treatment with Alimta and Keytruda every 3 weeks.  CURRENT THERAPY: Second line systemic chemotherapy with docetaxel 75 mg/M2 and Cyramza 10 mg/KG every weeks with Neulasta support.  First dose May 18, 2020.  INTERVAL HISTORY: Micheal Amadon89.o. male returns to the clinic today for follow-up visit.  The patient is feeling fine today with no concerning complaints.  He has less fatigue and weakness in the last few days.  He denied having any current chest pain, shortness of breath but continues to  have mild cough with no hemoptysis.  He denied having any fever or chills.  He has no nausea, vomiting, diarrhea or constipation.  He has no headache or visual changes.  He had repeat CT scan of the chest, abdomen pelvis performed recently and he is here for evaluation and discussion of his scan results and treatment options.   MEDICAL HISTORY: Past Medical History:  Diagnosis Date  . Chicken pox   . DM2 (diabetes mellitus, type 2) ([facility]  . History of kidney stones    passed several   . Hypertension   . Kidney stones    5-6 times  . Measles   . Mumps   . [place]roke ([facility]   x 2, last one was 09/18/2019- has some expressive aphasia    ALLERGIES:  is allergic to bee venom.  MEDICATIONS:  Current Outpatient Medications  Medication Sig Dispense Refill  . acetaminophen (TYLENOL) 500 MG tablet Take 500 mg by mouth every 6 (six) hours as needed.     . [place] Kitchenpixaban (ELIQUIS) 5 MG TABS tablet Take 1 tablet (5 mg total) by mouth 2 (two) times daily. 60 tablet 11  . atorvastatin (LIPITOR) 40 MG tablet Take 1 tablet (40 mg total) by mouth daily at 6 PM. 90 tablet 3  . folic acid (FOLVITE) 1 MG tablet Take 1 tablet (1 mg total) by mouth daily. 90 tablet 3  . gabapentin (NEURONTIN) 300 MG capsule Take 1 capsule (300 mg total) by mouth at bedtime. 90 capsule 3  . HYDROcodone-homatropine (HYCODAN) 5-1.5 MG/5ML syrup Take 5 mLs by mouth every 6 (six) hours as needed for cough. 120 mL 0  . metFORMIN (GLUCOPHAGE)  500 MG tablet TAKE TWO TABLETS BY MOUTH TWICE A DAY WITH MEAL(S) 360 tablet 3  . Omega-3 Fatty Acids (FISH OIL) 1200 MG CAPS Take by mouth daily.    . prochlorperazine (COMPAZINE) 10 MG tablet Take 1 tablet (10 mg total) by mouth every 6 (six) hours as needed. 30 tablet 2  . sitaGLIPtin (JANUVIA) 100 MG tablet Take 1 tablet (100 mg total) by mouth daily. 90 tablet 3   No current facility-administered medications for this visit.    SURGICAL HISTORY:  Past Surgical History:  Procedure Laterality  Date  . COLONOSCOPY W/ POLYPECTOMY    . VIDEO BRONCHOSCOPY WITH ENDOBRONCHIAL ULTRASOUND N/A 09/30/2019   Procedure: VIDEO BRONCHOSCOPY WITH ENDOBRONCHIAL ULTRASOUND;  Surgeon: Collene Gobble, MD;  Location: [facility];  Service: Thoracic;  Laterality: N/A;  . WISDOM TOOTH EXTRACTION  1976-77    REVIEW OF SYSTEMS:  Constitutional: positive for fatigue Eyes: negative Ears, nose, mouth, throat, and face: negative Respiratory: positive for cough Cardiovascular: negative Gastrointestinal: negative Genitourinary:negative Integument/breast: negative Hematologic/lymphatic: negative Musculoskeletal:negative Neurological: negative Behavioral/Psych: negative Endocrine: negative Allergic/Immunologic: negative   PHYSICAL EXAMINATION: General appearance: alert, cooperative, fatigued and no distress Head: Normocephalic, without obvious abnormality, atraumatic Neck: no adenopathy, no JVD, supple, symmetrical, trachea midline and thyroid not enlarged, symmetric, no tenderness/mass/nodules Lymph nodes: Cervical, supraclavicular, and axillary nodes normal. Resp: clear to auscultation bilaterally Back: symmetric, no curvature. ROM normal. No CVA tenderness. Cardio: regular rate and rhythm, S1, S2 normal, no murmur, click, rub or gallop GI: soft, non-tender; bowel sounds normal; no masses,  no organomegaly Extremities: extremities normal, atraumatic, no cyanosis or edema Neurologic: Alert and oriented X 3, normal strength and tone. Normal symmetric reflexes. Normal coordination and gait  ECOG PERFORMANCE STATUS: 1 - Symptomatic but completely ambulatory  Blood pressure (!) 155/85, pulse 90, temperature 97.8 F (36.6 C), temperature source Tympanic, resp. rate 20, height 6' 2"  (1.88 m), weight 199 lb 9.6 oz (90.5 kg), SpO2 96 %.  LABORATORY DATA: Lab Results  Component Value Date   WBC 5.3 05/11/2020   HGB 9.9 (L) 05/11/2020   HCT 30.3 (L) 05/11/2020   MCV 103.1 (H) 05/11/2020   PLT 223 05/11/2020       Chemistry      Component Value Date/Time   NA 142 04/20/2020 1135   K 4.5 04/20/2020 1135   CL 105 04/20/2020 1135   CO2 24 04/20/2020 1135   BUN 19 04/20/2020 1135   CREATININE 1.31 (H) 04/20/2020 1135   CREATININE 1.13 10/16/2013 1118      Component Value Date/Time   CALCIUM 9.7 04/20/2020 1135   ALKPHOS 77 04/20/2020 1135   AST 46 (H) 04/20/2020 1135   ALT 35 04/20/2020 1135   BILITOT 0.5 04/20/2020 1135       RADIOGRAPHIC STUDIES: CT Chest W Contrast  Result Date: 05/10/2020 CLINICAL DATA:  Primary Cancer Type: Lung Imaging Indication: Assess response to therapy Interval therapy since last imaging? Yes Initial Cancer Diagnosis Date: 06/30/2021l Established by: Biopsy-proven Detailed Pathology: Stage IV non-small cell lung cancer, adenocarcinoma. Primary Tumor location:  Right upper lobe.  Left lower lobe nodule. Surgeries: No. Chemotherapy: Yes; Ongoing? Yes; Most recent administration: 04/20/2020 Immunotherapy?  Yes; Type: Keytruda; ongoing? Yes Radiation therapy? Yes; Date Range: 11/03/2019 - 11/23/2019; Target: Right upper lung. EXAM: CT CHEST, ABDOMEN, AND PELVIS WITH CONTRAST TECHNIQUE: Multidetector CT imaging of the chest, abdomen and pelvis was performed following the standard protocol during bolus administration of intravenous contrast. CONTRAST:  164m OMNIPAQUE IOHEXOL 300 MG/ML  SOLN, additional oral enteric contrast COMPARISON:  Most recent CT chest, abdomen and pelvis 03/08/2020. 10/26/2019 PET-CT. FINDINGS: CT CHEST FINDINGS Cardiovascular: No significant vascular findings. Normal heart size. No pericardial effusion. Mediastinum/Nodes: Unchanged post treatment appearance of soft tissue about the right hilum (series 2, image 29). No discretely enlarged mediastinal, hilar, or axillary lymph nodes. Thyroid gland, trachea, and esophagus demonstrate no significant findings. Lungs/Pleura: Moderate right pleural effusion, increased compared to prior examination. Significant  interval increase in now very dense, masslike consolidation of the posterior right lower lobe (series 6, image 94). No significant change in suprahilar and paramedian consolidation and fibrosis about a mass of the medial right upper lobe, axial component measuring approximately 3.9 x 2.3 cm (series 6, image 63). Slight interval increase in minimal radiation fibrosis of the medial left upper lobe (series 6, image 69). There are numerous small bilateral pulmonary nodules, which are slightly increased in size compared to prior examination, an index nodule of the superior segment left lower lobe measuring 7 mm, previously 5 mm (series 6, image 81), and index nodule of the central right middle lobe measuring 6 mm, previously 4 mm (series 6, image 96). The largest nodule in the peripheral left lower lobe measures 1.1 x 0.9 cm, previously 7 mm (series 6, image 116). Musculoskeletal: No chest wall mass or suspicious bone lesions identified. CT ABDOMEN PELVIS FINDINGS Hepatobiliary: No solid liver abnormality is seen. No gallstones, gallbladder wall thickening, or biliary dilatation. Pancreas: Unremarkable. No pancreatic ductal dilatation or surrounding inflammatory changes. Spleen: Normal in size without significant abnormality. Adrenals/Urinary Tract: Adrenal glands are unremarkable. Tiny nonobstructive calculus of the superior pole of the right kidney (series 2, image 76) and inferior pole of the left kidney (series 4, image 94). Redemonstrated 1.2 cm calculus within the posterior right aspect of the bladder. Stomach/Bowel: Stomach is within normal limits. Diverticulum of the transverse portion of the duodenum. Appendix appears normal. No evidence of bowel wall thickening, distention, or inflammatory changes. Severe sigmoid diverticulosis. Vascular/Lymphatic: No significant vascular findings are present. No enlarged abdominal or pelvic lymph nodes. Reproductive: No mass or other abnormality. Other: Fat containing left  inguinal hernia. No abdominopelvic ascites. Musculoskeletal: No acute or significant osseous findings. IMPRESSION: 1. There are numerous small bilateral pulmonary nodules, slightly increased in size compared to prior examination and consistent with worsened pulmonary metastatic disease. 2. Significant interval increase in now very dense, masslike consolidation of the posterior right lower lobe. Generally favor developing radiation fibrosis given appearance. Attention on follow-up. 3. Unchanged post treatment appearance of paramedian right upper lobe mass and the right hilum. 4. No discretely enlarged mediastinal or hilar lymph nodes. 5. No evidence of distant metastatic disease within the abdomen or pelvis. 6. Nonobstructive bilateral nephrolithiasis. Redemonstrated 1.2 cm calculus within the posterior right aspect of the bladder. No hydronephrosis. Electronically Signed   By: Eddie Candle M.D.   On: 05/10/2020 11:54   CT Abdomen Pelvis W Contrast  Result Date: 05/10/2020 CLINICAL DATA:  Primary Cancer Type: Lung Imaging Indication: Assess response to therapy Interval therapy since last imaging? Yes Initial Cancer Diagnosis Date: 06/30/2021l Established by: Biopsy-proven Detailed Pathology: Stage IV non-small cell lung cancer, adenocarcinoma. Primary Tumor location:  Right upper lobe.  Left lower lobe nodule. Surgeries: No. Chemotherapy: Yes; Ongoing? Yes; Most recent administration: 04/20/2020 Immunotherapy?  Yes; Type: Keytruda; ongoing? Yes Radiation therapy? Yes; Date Range: 11/03/2019 - 11/23/2019; Target: Right upper lung. EXAM: CT CHEST, ABDOMEN, AND PELVIS WITH CONTRAST TECHNIQUE: Multidetector CT imaging of the chest, abdomen  and pelvis was performed following the standard protocol during bolus administration of intravenous contrast. CONTRAST:  192m OMNIPAQUE IOHEXOL 300 MG/ML SOLN, additional oral enteric contrast COMPARISON:  Most recent CT chest, abdomen and pelvis 03/08/2020. 10/26/2019 PET-CT.  FINDINGS: CT CHEST FINDINGS Cardiovascular: No significant vascular findings. Normal heart size. No pericardial effusion. Mediastinum/Nodes: Unchanged post treatment appearance of soft tissue about the right hilum (series 2, image 29). No discretely enlarged mediastinal, hilar, or axillary lymph nodes. Thyroid gland, trachea, and esophagus demonstrate no significant findings. Lungs/Pleura: Moderate right pleural effusion, increased compared to prior examination. Significant interval increase in now very dense, masslike consolidation of the posterior right lower lobe (series 6, image 94). No significant change in suprahilar and paramedian consolidation and fibrosis about a mass of the medial right upper lobe, axial component measuring approximately 3.9 x 2.3 cm (series 6, image 63). Slight interval increase in minimal radiation fibrosis of the medial left upper lobe (series 6, image 69). There are numerous small bilateral pulmonary nodules, which are slightly increased in size compared to prior examination, an index nodule of the superior segment left lower lobe measuring 7 mm, previously 5 mm (series 6, image 81), and index nodule of the central right middle lobe measuring 6 mm, previously 4 mm (series 6, image 96). The largest nodule in the peripheral left lower lobe measures 1.1 x 0.9 cm, previously 7 mm (series 6, image 116). Musculoskeletal: No chest wall mass or suspicious bone lesions identified. CT ABDOMEN PELVIS FINDINGS Hepatobiliary: No solid liver abnormality is seen. No gallstones, gallbladder wall thickening, or biliary dilatation. Pancreas: Unremarkable. No pancreatic ductal dilatation or surrounding inflammatory changes. Spleen: Normal in size without significant abnormality. Adrenals/Urinary Tract: Adrenal glands are unremarkable. Tiny nonobstructive calculus of the superior pole of the right kidney (series 2, image 76) and inferior pole of the left kidney (series 4, image 94). Redemonstrated 1.2 cm  calculus within the posterior right aspect of the bladder. Stomach/Bowel: Stomach is within normal limits. Diverticulum of the transverse portion of the duodenum. Appendix appears normal. No evidence of bowel wall thickening, distention, or inflammatory changes. Severe sigmoid diverticulosis. Vascular/Lymphatic: No significant vascular findings are present. No enlarged abdominal or pelvic lymph nodes. Reproductive: No mass or other abnormality. Other: Fat containing left inguinal hernia. No abdominopelvic ascites. Musculoskeletal: No acute or significant osseous findings. IMPRESSION: 1. There are numerous small bilateral pulmonary nodules, slightly increased in size compared to prior examination and consistent with worsened pulmonary metastatic disease. 2. Significant interval increase in now very dense, masslike consolidation of the posterior right lower lobe. Generally favor developing radiation fibrosis given appearance. Attention on follow-up. 3. Unchanged post treatment appearance of paramedian right upper lobe mass and the right hilum. 4. No discretely enlarged mediastinal or hilar lymph nodes. 5. No evidence of distant metastatic disease within the abdomen or pelvis. 6. Nonobstructive bilateral nephrolithiasis. Redemonstrated 1.2 cm calculus within the posterior right aspect of the bladder. No hydronephrosis. Electronically Signed   By: AEddie CandleM.D.   On: 05/10/2020 11:54   [facility] PACEART REMOTE DEVICE CHECK  Result Date: 04/26/2020 ILR for AF, ongoing with onset 04/25/20, rates ok (although higher than average). [place] Eliquis. Routing for further review of persistent AF. Reviewed presenting rhythm is rate controlled AF Episode detail looks like perhaps caurse AFib vs AFlutter, some appear to have sinus beats as well. Rates low 100's pt known AFib recently started on Eliquis. Called patient to discuss and assess any symptoms.  Left a message with DC direct number.  Recommend clinic visit with AFib clinic  or office in the next couple days if asymptomatic, to assess rate and or rhythm control needs Tommye Standard, PA-C  [facility] [place]  Result Date: 04/22/2020 ILR summary report received. Battery status OK. Normal device function. No new symptom, tachy, brady, or pause episodes. One previously viewed AF episode that was 4 hours and 52 minutes, AF burden is 0.6% of the time.  [place] started per previous report.  Monthly summary reports and ROV/PRN Kathy Breach, RN, CCDS, [employer]   ASSESSMENT AND PLAN: This is a very pleasant 70 years old white male recently diagnosed with a stage IV (T3, N2, M1 a) non-small cell lung cancer, adenocarcinoma presented with large right upper lobe lung mass with extension to the right suprahilar region as well as right hilar and mediastinal lymphadenopathy with obstruction of the right upper lobe and bilateral pulmonary nodules diagnosed in July 2021. The molecular study shows no actionable mutations and PD-L1 expression is negative. He had a short course of palliative radiotherapy to the obstructive lung mass under the care of Dr. Lisbeth Renshaw. The patient underwent systemic chemotherapy with carboplatin for AUC of 5, Alimta 500 mg/M2 and Keytruda 200 mg IV every 3 weeks status post 9 cycles.  Starting from cycle #5 he will be on maintenance treatment with Alimta and Keytruda every 3 weeks.  He has been tolerating this treatment well with no concerning complaints except for fatigue and cough. He had repeat CT scan of the chest, abdomen pelvis performed recently.  I personally and independently reviewed the scan images and discussed the results with the patient today. Unfortunately his scan showed some evidence for disease progression. I had a lengthy discussion with the patient today about his current condition and treatment options. I recommended for the patient to discontinue his current treatment with maintenance Alimta and Keytruda because of the disease  progression. I discussed with him other options for treatment including palliative care versus second line systemic chemotherapy with docetaxel 75 mg/M2 and Cyramza 10 mg/KG every 3 weeks with Neulasta support. The patient is interested in proceeding with systemic chemotherapy.  I discussed with him the adverse effect of this treatment including but not limited to alopecia, myelosuppression, nausea and vomiting, peripheral neuropathy, liver or renal dysfunction as well as the hemorrhagic effect from [place]. He is expected to start the first cycle of this treatment next week. I will call his pharmacy with prescription for Decadron 8 mg p.o. twice daily the day before, day of and day after the chemotherapy as well as refill of Hycodan. The patient will come back for follow-up visit in 2 weeks for evaluation and management of any adverse effect of his treatment. He was advised to call immediately if he has any other concerning symptoms in the interval. For the thyroid malignancy, he was referred to Dr. Harlow Asa for evaluation and surgical intervention.  The patient voices understanding of current disease status and treatment options and is in agreement with the current care plan.  All questions were answered. The patient knows to call the clinic with any problems, questions or concerns. We can certainly see the patient much sooner if necessary.  Disclaimer: This note was dictated with voice recognition software. Similar sounding words can inadvertently be transcribed and may not be corrected upon review.

## 2020-05-11 NOTE — Progress Notes (Signed)
DISCONTINUE ON PATHWAY REGIMEN - Non-Small Cell Lung     A cycle is every 21 days:     Pembrolizumab      Pemetrexed      Carboplatin   **Always confirm dose/schedule in your pharmacy ordering system**  REASON: Disease Progression PRIOR TREATMENT: [id number]: Pembrolizumab 200 mg + Pemetrexed 500 mg/m2 + Carboplatin AUC=5 q21 Days x 4 Cycles TREATMENT RESPONSE: Progressive Disease (PD)  START ON PATHWAY REGIMEN - Non-Small Cell Lung     A cycle is every 21 days:     Ramucirumab      Docetaxel   **Always confirm dose/schedule in your pharmacy ordering system**  Patient Characteristics: Stage IV Metastatic, Nonsquamous, Second Line - Chemotherapy/Immunotherapy, PS = 0, 1, No Prior PD-1/PD-L1  Inhibitor or Prior PD-1/PD-L1 Inhibitor + Chemotherapy, and Not a Candidate for Immunotherapy Therapeutic Status: Stage IV Metastatic Histology: Nonsquamous Cell ROS1 Rearrangement Status: Negative Other Mutations/Biomarkers: No Other Actionable Mutations Chemotherapy/Immunotherapy LOT: Second Line Chemotherapy/Immunotherapy Molecular Targeted Therapy: Not Appropriate KRAS G12C Mutation Status: Negative MET Exon 14 Mutation Status: Negative RET Gene Fusion Status: Negative EGFR Mutation Status: Negative/Wild Type NTRK Gene Fusion Status: Negative PD-L1 Expression Status: PD-L1 Negative ALK Rearrangement Status: Negative BRAF V600E Mutation Status: Negative ECOG Performance Status: 1 Immunotherapy Candidate Status: Not a Candidate for Immunotherapy Prior Immunotherapy Status: Prior PD-1/PD-L1 Inhibitor + Chemotherapy Intent of Therapy: Non-Curative / Palliative Intent, Discussed with Patient

## 2020-05-13 ENCOUNTER — Telehealth: Payer: Self-pay | Admitting: Internal Medicine

## 2020-05-13 NOTE — Telephone Encounter (Signed)
Scheduled per 2/9 los. Called and spoke with pt, confirmed 2/17 appt. Pt will refer to mychart for other appts

## 2020-05-16 NOTE — Progress Notes (Deleted)
[facility] OFFICE PROGRESS NOTE  Copland, Gay Filler, MD [address] [place] [address]  DIAGNOSIS: Stage IV (T3, N2, M1a) non-small cell lung cancer, adenocarcinoma presented with large right upper lobe lung mass extending to the right suprahilar region in addition to right hilar and mediastinal lymphadenopathy with obstruction of the associated upper lobe bronchus and bilateral subcentimeter pulmonary nodules diagnosed in July 2021.  Biomarker Findings Microsatellite status - MS-Stable Tumor Mutational Burden - 3 Muts/Mb Genomic Findings For a complete list of the genes assayed, please refer to the Appendix. ERBB2 [id number], amplification - equivocal? HGF amplification - equivocal? MTAP loss exons 5-8 MYC amplification - equivocal? CDKN2A/B CDKN2B loss, CDKN2A loss CUL3 Y7f*19 RPTOR amplification TP53 [id number] Disease relevant genes with no reportable alterations: ALK, BRAF, EGFR, KRAS, MET, RET, ROS1  PDL1 Expression: Negative  PRIOR THERAPY: 1) Palliative radiotherapy to the obstructive lung mass under the care of Dr. MoLisbeth RenshawThe past treatment is scheduled for8/23/21 2) 2) Systemic chemotherapy with carboplatin for AUC of 5, Alimta 500 mg/M2 and Keytruda 200 mg IV every 3 weeks.  First dose November 04, 2019.  Status post 9 cycles.  Starting from cycle #5 he will be on maintenance treatment with Alimta and Keytruda every 3 weeks.  CURRENT THERAPY: Second line systemic chemotherapy with docetaxel 75 mg/M2 and Cyramza 10 mg/KG every weeks with Neulasta support.  First dose May 19, 2020.  INTERVAL HISTORY: Micheal Oliver.o. male returns to the clinic today for a follow up visit. The patient is feeling _ today without any concerning complaints. The patient recently showed evidence of disease progression and his treatment was switched to docetaxel and cyramza. He is scheduled for his first cycle of treatment today. He _ his  pre-medications as prescribed today.   Today, the patient denies fevers, chills, night sweats or weight loss. He was seen by a member of the nutritionist team in the past. He reports baseline dyspnea on exertion and cough for which he takes hycodan. He denies chest pain or hemoptysis. He denies nausea, vomiting, diarrhea, or constipation. He denies headaches. He recently had a follow up with his eye doctor for _. He is here for evaluation before starting cycle #1 of his new treatment.   MEDICAL HISTORY: Past Medical History:  Diagnosis Date  . Chicken pox   . DM2 (diabetes mellitus, type 2) ([facility]  . History of kidney stones    passed several   . Hypertension   . Kidney stones    5-6 times  . Measles   . Mumps   . Stroke ([facility]   x 2, last one was 09/18/2019- has some expressive aphasia    ALLERGIES:  is allergic to bee venom.  MEDICATIONS:  Current Outpatient Medications  Medication Sig Dispense Refill  . acetaminophen (TYLENOL) 500 MG tablet Take 500 mg by mouth every 6 (six) hours as needed.     . Marland Kitchenpixaban (ELIQUIS) 5 MG TABS tablet Take 1 tablet (5 mg total) by mouth 2 (two) times daily. 60 tablet 11  . atorvastatin (LIPITOR) 40 MG tablet Take 1 tablet (40 mg total) by mouth daily at 6 PM. 90 tablet 3  . dexamethasone (DECADRON) 4 MG tablet 2 tablet p.o. twice daily the day before, day of and day after chemotherapy every 3 weeks. 40 tablet 1  . folic acid (FOLVITE) 1 MG tablet Take 1 tablet (1 mg total) by mouth daily. 90 tablet 3  .  gabapentin (NEURONTIN) 300 MG capsule Take 1 capsule (300 mg total) by mouth at bedtime. 90 capsule 3  . HYDROcodone-homatropine (HYCODAN) 5-1.5 MG/5ML syrup Take 5 mLs by mouth every 6 (six) hours as needed for cough. 120 mL 0  . metFORMIN (GLUCOPHAGE) 500 MG tablet TAKE TWO TABLETS BY MOUTH TWICE A DAY WITH MEAL(S) 360 tablet 3  . Omega-3 Fatty Acids (FISH OIL) 1200 MG CAPS Take by mouth daily.    . prochlorperazine (COMPAZINE) 10 MG tablet Take 1  tablet (10 mg total) by mouth every 6 (six) hours as needed. 30 tablet 2  . sitaGLIPtin (JANUVIA) 100 MG tablet Take 1 tablet (100 mg total) by mouth daily. 90 tablet 3   No current facility-administered medications for this visit.    SURGICAL HISTORY:  Past Surgical History:  Procedure Laterality Date  . COLONOSCOPY W/ POLYPECTOMY    . VIDEO BRONCHOSCOPY WITH ENDOBRONCHIAL ULTRASOUND N/A 09/30/2019   Procedure: VIDEO BRONCHOSCOPY WITH ENDOBRONCHIAL ULTRASOUND;  Surgeon: Collene Gobble, MD;  Location: [facility];  Service: Thoracic;  Laterality: N/A;  . WISDOM TOOTH EXTRACTION  1976-77    REVIEW OF SYSTEMS:   Review of Systems  Constitutional: Negative for appetite change, chills, fatigue, fever and unexpected weight change.  HENT:   Negative for mouth sores, nosebleeds, sore throat and trouble swallowing.   Eyes: Negative for eye problems and icterus.  Respiratory: Negative for cough, hemoptysis, shortness of breath and wheezing.   Cardiovascular: Negative for chest pain and leg swelling.  Gastrointestinal: Negative for abdominal pain, constipation, diarrhea, nausea and vomiting.  Genitourinary: Negative for bladder incontinence, difficulty urinating, dysuria, frequency and hematuria.   Musculoskeletal: Negative for back pain, gait problem, neck pain and neck stiffness.  Skin: Negative for itching and rash.  Neurological: Negative for dizziness, extremity weakness, gait problem, headaches, light-headedness and seizures.  Hematological: Negative for adenopathy. Does not bruise/bleed easily.  Psychiatric/Behavioral: Negative for confusion, depression and sleep disturbance. The patient is not nervous/anxious.     PHYSICAL EXAMINATION:  There were no vitals taken for this visit.  ECOG PERFORMANCE STATUS: {CHL ONC ECOG [id number]  Physical Exam  Constitutional: Oriented to person, place, and time and well-developed, well-nourished, and in no distress. No distress.  HENT:  Head:  Normocephalic and atraumatic.  Mouth/Throat: Oropharynx is clear and moist. No oropharyngeal exudate.  Eyes: Conjunctivae are normal. Right eye exhibits no discharge. Left eye exhibits no discharge. No scleral icterus.  Neck: Normal range of motion. Neck supple.  Cardiovascular: Normal rate, regular rhythm, normal heart sounds and intact distal pulses.   Pulmonary/Chest: Effort normal and breath sounds normal. No respiratory distress. No wheezes. No rales.  Abdominal: Soft. Bowel sounds are normal. Exhibits no distension and no mass. There is no tenderness.  Musculoskeletal: Normal range of motion. Exhibits no edema.  Lymphadenopathy:    No cervical adenopathy.  Neurological: Alert and oriented to person, place, and time. Exhibits normal muscle tone. Gait normal. Coordination normal.  Skin: Skin is warm and dry. No rash noted. Not diaphoretic. No erythema. No pallor.  Psychiatric: Mood, memory and judgment normal.  Vitals reviewed.  LABORATORY DATA: Lab Results  Component Value Date   WBC 5.3 05/11/2020   HGB 9.9 (L) 05/11/2020   HCT 30.3 (L) 05/11/2020   MCV 103.1 (H) 05/11/2020   PLT 223 05/11/2020      Chemistry      Component Value Date/Time   NA 140 05/11/2020 0934   K 4.5 05/11/2020 0934   CL 106 05/11/2020  0934   CO2 25 05/11/2020 0934   BUN 18 05/11/2020 0934   CREATININE 1.14 05/11/2020 0934   CREATININE 1.13 10/16/2013 1118      Component Value Date/Time   CALCIUM 9.2 05/11/2020 0934   ALKPHOS 83 05/11/2020 0934   AST 30 05/11/2020 0934   ALT 31 05/11/2020 0934   BILITOT 0.6 05/11/2020 0934       RADIOGRAPHIC STUDIES:  CT Chest W Contrast  Result Date: 05/10/2020 CLINICAL DATA:  Primary Cancer Type: Lung Imaging Indication: Assess response to therapy Interval therapy since last imaging? Yes Initial Cancer Diagnosis Date: 06/30/2021l Established by: Biopsy-proven Detailed Pathology: Stage IV non-small cell lung cancer, adenocarcinoma. Primary Tumor location:   Right upper lobe.  Left lower lobe nodule. Surgeries: No. Chemotherapy: Yes; Ongoing? Yes; Most recent administration: 04/20/2020 Immunotherapy?  Yes; Type: Keytruda; ongoing? Yes Radiation therapy? Yes; Date Range: 11/03/2019 - 11/23/2019; Target: Right upper lung. EXAM: CT CHEST, ABDOMEN, AND PELVIS WITH CONTRAST TECHNIQUE: Multidetector CT imaging of the chest, abdomen and pelvis was performed following the standard protocol during bolus administration of intravenous contrast. CONTRAST:  175m OMNIPAQUE IOHEXOL 300 MG/ML SOLN, additional oral enteric contrast COMPARISON:  Most recent CT chest, abdomen and pelvis 03/08/2020. 10/26/2019 PET-CT. FINDINGS: CT CHEST FINDINGS Cardiovascular: No significant vascular findings. Normal heart size. No pericardial effusion. Mediastinum/Nodes: Unchanged post treatment appearance of soft tissue about the right hilum (series 2, image 29). No discretely enlarged mediastinal, hilar, or axillary lymph nodes. Thyroid gland, trachea, and esophagus demonstrate no significant findings. Lungs/Pleura: Moderate right pleural effusion, increased compared to prior examination. Significant interval increase in now very dense, masslike consolidation of the posterior right lower lobe (series 6, image 94). No significant change in suprahilar and paramedian consolidation and fibrosis about a mass of the medial right upper lobe, axial component measuring approximately 3.9 x 2.3 cm (series 6, image 63). Slight interval increase in minimal radiation fibrosis of the medial left upper lobe (series 6, image 69). There are numerous small bilateral pulmonary nodules, which are slightly increased in size compared to prior examination, an index nodule of the superior segment left lower lobe measuring 7 mm, previously 5 mm (series 6, image 81), and index nodule of the central right middle lobe measuring 6 mm, previously 4 mm (series 6, image 96). The largest nodule in the peripheral left lower lobe  measures 1.1 x 0.9 cm, previously 7 mm (series 6, image 116). Musculoskeletal: No chest wall mass or suspicious bone lesions identified. CT ABDOMEN PELVIS FINDINGS Hepatobiliary: No solid liver abnormality is seen. No gallstones, gallbladder wall thickening, or biliary dilatation. Pancreas: Unremarkable. No pancreatic ductal dilatation or surrounding inflammatory changes. Spleen: Normal in size without significant abnormality. Adrenals/Urinary Tract: Adrenal glands are unremarkable. Tiny nonobstructive calculus of the superior pole of the right kidney (series 2, image 76) and inferior pole of the left kidney (series 4, image 94). Redemonstrated 1.2 cm calculus within the posterior right aspect of the bladder. Stomach/Bowel: Stomach is within normal limits. Diverticulum of the transverse portion of the duodenum. Appendix appears normal. No evidence of bowel wall thickening, distention, or inflammatory changes. Severe sigmoid diverticulosis. Vascular/Lymphatic: No significant vascular findings are present. No enlarged abdominal or pelvic lymph nodes. Reproductive: No mass or other abnormality. Other: Fat containing left inguinal hernia. No abdominopelvic ascites. Musculoskeletal: No acute or significant osseous findings. IMPRESSION: 1. There are numerous small bilateral pulmonary nodules, slightly increased in size compared to prior examination and consistent with worsened pulmonary metastatic disease. 2. Significant interval increase  in now very dense, masslike consolidation of the posterior right lower lobe. Generally favor developing radiation fibrosis given appearance. Attention on follow-up. 3. Unchanged post treatment appearance of paramedian right upper lobe mass and the right hilum. 4. No discretely enlarged mediastinal or hilar lymph nodes. 5. No evidence of distant metastatic disease within the abdomen or pelvis. 6. Nonobstructive bilateral nephrolithiasis. Redemonstrated 1.2 cm calculus within the posterior  right aspect of the bladder. No hydronephrosis. Electronically Signed   By: Eddie Candle M.D.   On: 05/10/2020 11:54   CT Abdomen Pelvis W Contrast  Result Date: 05/10/2020 CLINICAL DATA:  Primary Cancer Type: Lung Imaging Indication: Assess response to therapy Interval therapy since last imaging? Yes Initial Cancer Diagnosis Date: 06/30/2021l Established by: Biopsy-proven Detailed Pathology: Stage IV non-small cell lung cancer, adenocarcinoma. Primary Tumor location:  Right upper lobe.  Left lower lobe nodule. Surgeries: No. Chemotherapy: Yes; Ongoing? Yes; Most recent administration: 04/20/2020 Immunotherapy?  Yes; Type: Keytruda; ongoing? Yes Radiation therapy? Yes; Date Range: 11/03/2019 - 11/23/2019; Target: Right upper lung. EXAM: CT CHEST, ABDOMEN, AND PELVIS WITH CONTRAST TECHNIQUE: Multidetector CT imaging of the chest, abdomen and pelvis was performed following the standard protocol during bolus administration of intravenous contrast. CONTRAST:  1100m OMNIPAQUE IOHEXOL 300 MG/ML SOLN, additional oral enteric contrast COMPARISON:  Most recent CT chest, abdomen and pelvis 03/08/2020. 10/26/2019 PET-CT. FINDINGS: CT CHEST FINDINGS Cardiovascular: No significant vascular findings. Normal heart size. No pericardial effusion. Mediastinum/Nodes: Unchanged post treatment appearance of soft tissue about the right hilum (series 2, image 29). No discretely enlarged mediastinal, hilar, or axillary lymph nodes. Thyroid gland, trachea, and esophagus demonstrate no significant findings. Lungs/Pleura: Moderate right pleural effusion, increased compared to prior examination. Significant interval increase in now very dense, masslike consolidation of the posterior right lower lobe (series 6, image 94). No significant change in suprahilar and paramedian consolidation and fibrosis about a mass of the medial right upper lobe, axial component measuring approximately 3.9 x 2.3 cm (series 6, image 63). Slight interval increase  in minimal radiation fibrosis of the medial left upper lobe (series 6, image 69). There are numerous small bilateral pulmonary nodules, which are slightly increased in size compared to prior examination, an index nodule of the superior segment left lower lobe measuring 7 mm, previously 5 mm (series 6, image 81), and index nodule of the central right middle lobe measuring 6 mm, previously 4 mm (series 6, image 96). The largest nodule in the peripheral left lower lobe measures 1.1 x 0.9 cm, previously 7 mm (series 6, image 116). Musculoskeletal: No chest wall mass or suspicious bone lesions identified. CT ABDOMEN PELVIS FINDINGS Hepatobiliary: No solid liver abnormality is seen. No gallstones, gallbladder wall thickening, or biliary dilatation. Pancreas: Unremarkable. No pancreatic ductal dilatation or surrounding inflammatory changes. Spleen: Normal in size without significant abnormality. Adrenals/Urinary Tract: Adrenal glands are unremarkable. Tiny nonobstructive calculus of the superior pole of the right kidney (series 2, image 76) and inferior pole of the left kidney (series 4, image 94). Redemonstrated 1.2 cm calculus within the posterior right aspect of the bladder. Stomach/Bowel: Stomach is within normal limits. Diverticulum of the transverse portion of the duodenum. Appendix appears normal. No evidence of bowel wall thickening, distention, or inflammatory changes. Severe sigmoid diverticulosis. Vascular/Lymphatic: No significant vascular findings are present. No enlarged abdominal or pelvic lymph nodes. Reproductive: No mass or other abnormality. Other: Fat containing left inguinal hernia. No abdominopelvic ascites. Musculoskeletal: No acute or significant osseous findings. IMPRESSION: 1. There are numerous small bilateral  pulmonary nodules, slightly increased in size compared to prior examination and consistent with worsened pulmonary metastatic disease. 2. Significant interval increase in now very dense,  masslike consolidation of the posterior right lower lobe. Generally favor developing radiation fibrosis given appearance. Attention on follow-up. 3. Unchanged post treatment appearance of paramedian right upper lobe mass and the right hilum. 4. No discretely enlarged mediastinal or hilar lymph nodes. 5. No evidence of distant metastatic disease within the abdomen or pelvis. 6. Nonobstructive bilateral nephrolithiasis. Redemonstrated 1.2 cm calculus within the posterior right aspect of the bladder. No hydronephrosis. Electronically Signed   By: Eddie Candle M.D.   On: 05/10/2020 11:54   CUP PACEART REMOTE DEVICE CHECK  Result Date: 04/26/2020 ILR for AF, ongoing with onset 04/25/20, rates ok (although higher than average). May- Eliquis. Routing for further review of persistent AF. Reviewed presenting rhythm is rate controlled AF Episode detail looks like perhaps caurse AFib vs AFlutter, some appear to have sinus beats as well. Rates low 100's pt known AFib recently started on Eliquis. Called patient to discuss and assess any symptoms.  Left a message with DC direct number. Recommend clinic visit with AFib clinic or office in the next couple days if asymptomatic, to assess rate and or rhythm control needs Tommye Standard, PA-C  [facility] [place]  Result Date: 04/22/2020 ILR summary report received. Battery status OK. Normal device function. No new symptom, tachy, brady, or pause episodes. One previously viewed AF episode that was 4 hours and 52 minutes, AF burden is 0.6% of the time.  [place] started per previous report.  Monthly summary reports and ROV/PRN Kathy Breach, RN, CCDS, [employer]    ASSESSMENT/PLAN:  This is a very pleasant 35 year oldCaucasianmale recently diagnosed with a stage IV (T3, N2, M1 a) non-small cell lung cancer, adenocarcinoma presented with large right upper lobe lung mass with extension to the right suprahilar region as well as right hilar and mediastinal  lymphadenopathy with obstruction of the right upper lobe and bilateral pulmonary nodules diagnosed in July 2021. Negative PDL1 expression. He has no actionable mutations.   The patientcompletedpalliative radiotherapy to the obstructive lung mass under the care of Dr. Lisbeth Renshaw. The last treatmentwasscheduled for 11/23/19  The patient underwent systemic chemotherapy with carboplatin for AUC of 5, Alimta 500 mg/M2 and Keytruda 200 mg IV every 3 weeks status post 9 cycles.  Starting from cycle #5 he will be on maintenance treatment with Alimta and Keytruda every 3 weeks.  This was discontinued due to evidence for disease progression.   He is currently undergoing treatment with docetaxel 75 mg/m2 and cyramza 10 mg/kg IV every 3 weeks with neulasta support. He is here for cycle #1 today.   Labs were reviewed. Recommend that he _ with cycle #1 today as scheduled.   We will see him back next week for a 1 week follow up visit and to manage any adverse side effects of treatment.   The patient was advised to call immediately if he has any concerning symptoms in the interval. The patient voices understanding of current disease status and treatment options and is in agreement with the current care plan. All questions were answered. The patient knows to call the clinic with any problems, questions or concerns. We can certainly see the patient much sooner if necessary      No orders of the defined types were placed in this encounter.    I spent {CHL ONC TIME VISIT - EHOZY:[id number]}  counseling the patient face to face. The total time spent in the appointment was {CHL ONC TIME VISIT - CQPEA:[id number]}.  Quamel Fitzmaurice L Baylee Mccorkel, PA-C 05/16/20

## 2020-05-17 NOTE — Progress Notes (Signed)
The following biosimilar Udenyca ([id number]) has been selected for use in this patient.  Kennith Center, Pharm.D., CPP 05/17/2020@11 :59 AM

## 2020-05-19 ENCOUNTER — Inpatient Hospital Stay: Payer: Medicare HMO | Admitting: Physician Assistant

## 2020-05-19 ENCOUNTER — Inpatient Hospital Stay: Payer: Medicare HMO

## 2020-05-19 ENCOUNTER — Other Ambulatory Visit: Payer: Self-pay

## 2020-05-19 VITALS — BP 140/88 | HR 80 | Temp 98.0°F | Resp 17

## 2020-05-19 DIAGNOSIS — C3411 Malignant neoplasm of upper lobe, right bronchus or lung: Secondary | ICD-10-CM

## 2020-05-19 DIAGNOSIS — I10 Essential (primary) hypertension: Secondary | ICD-10-CM | POA: Diagnosis not present

## 2020-05-19 DIAGNOSIS — Z8673 Personal history of transient ischemic attack (TIA), and cerebral infarction without residual deficits: Secondary | ICD-10-CM | POA: Diagnosis not present

## 2020-05-19 DIAGNOSIS — Z5112 Encounter for antineoplastic immunotherapy: Secondary | ICD-10-CM | POA: Diagnosis not present

## 2020-05-19 DIAGNOSIS — Z7901 Long term (current) use of anticoagulants: Secondary | ICD-10-CM | POA: Diagnosis not present

## 2020-05-19 DIAGNOSIS — E119 Type 2 diabetes mellitus without complications: Secondary | ICD-10-CM | POA: Diagnosis not present

## 2020-05-19 DIAGNOSIS — Z7952 Long term (current) use of systemic steroids: Secondary | ICD-10-CM | POA: Diagnosis not present

## 2020-05-19 DIAGNOSIS — Z5189 Encounter for other specified aftercare: Secondary | ICD-10-CM | POA: Diagnosis not present

## 2020-05-19 DIAGNOSIS — Z5111 Encounter for antineoplastic chemotherapy: Secondary | ICD-10-CM | POA: Diagnosis not present

## 2020-05-19 DIAGNOSIS — I4819 Other persistent atrial fibrillation: Secondary | ICD-10-CM | POA: Diagnosis not present

## 2020-05-19 LAB — CMP (CANCER CENTER ONLY)
ALT: 22 U/L (ref 0–44)
AST: 20 U/L (ref 15–41)
Albumin: 3.5 g/dL (ref 3.5–5.0)
Alkaline Phosphatase: 84 U/L (ref 38–126)
Anion gap: 13 (ref 5–15)
BUN: 27 mg/dL — ABNORMAL HIGH (ref 8–23)
CO2: 19 mmol/L — ABNORMAL LOW (ref 22–32)
Calcium: 9.2 mg/dL (ref 8.9–10.3)
Chloride: 107 mmol/L (ref 98–111)
Creatinine: 1.43 mg/dL — ABNORMAL HIGH (ref 0.61–1.24)
GFR, Estimated: 53 mL/min — ABNORMAL LOW (ref >60)
Glucose, Bld: 229 mg/dL — ABNORMAL HIGH (ref 70–99)
Potassium: 4.1 mmol/L (ref 3.5–5.1)
Sodium: 139 mmol/L (ref 135–145)
Total Bilirubin: 0.5 mg/dL (ref 0.3–1.2)
Total Protein: 7.3 g/dL (ref 6.5–8.1)

## 2020-05-19 LAB — CBC WITH DIFFERENTIAL (CANCER CENTER ONLY)
Abs Immature Granulocytes: 0.08 10*3/uL — ABNORMAL HIGH (ref 0.00–0.07)
Basophils Absolute: 0 10*3/uL (ref 0.0–0.1)
Basophils Relative: 0 %
Eosinophils Absolute: 0 10*3/uL (ref 0.0–0.5)
Eosinophils Relative: 0 %
HCT: 30.6 % — ABNORMAL LOW (ref 39.0–52.0)
Hemoglobin: 10 g/dL — ABNORMAL LOW (ref 13.0–17.0)
Immature Granulocytes: 1 %
Lymphocytes Relative: 9 %
Lymphs Abs: 0.7 10*3/uL (ref 0.7–4.0)
MCH: 33.3 pg (ref 26.0–34.0)
MCHC: 32.7 g/dL (ref 30.0–36.0)
MCV: 102 fL — ABNORMAL HIGH (ref 80.0–100.0)
Monocytes Absolute: 0.7 10*3/uL (ref 0.1–1.0)
Monocytes Relative: 8 %
Neutro Abs: 6.9 10*3/uL (ref 1.7–7.7)
Neutrophils Relative %: 82 %
Platelet Count: 238 10*3/uL (ref 150–400)
RBC: 3 MIL/uL — ABNORMAL LOW (ref 4.22–5.81)
RDW: 14.2 % (ref 11.5–15.5)
WBC Count: 8.4 10*3/uL (ref 4.0–10.5)
nRBC: 0 % (ref 0.0–0.2)

## 2020-05-19 LAB — LACTATE DEHYDROGENASE: LDH: 187 U/L (ref 98–192)

## 2020-05-19 MED ORDER — DIPHENHYDRAMINE HCL 50 MG/ML IJ SOLN
50.0000 mg | Freq: Once | INTRAMUSCULAR | Status: AC
  Administered 2020-05-19: 50 mg via INTRAVENOUS

## 2020-05-19 MED ORDER — SODIUM CHLORIDE 0.9 % IV SOLN
75.0000 mg/m2 | Freq: Once | INTRAVENOUS | Status: AC
  Administered 2020-05-19: 160 mg via INTRAVENOUS
  Filled 2020-05-19: qty 16

## 2020-05-19 MED ORDER — ACETAMINOPHEN 325 MG PO TABS
650.0000 mg | ORAL_TABLET | Freq: Once | ORAL | Status: AC
  Administered 2020-05-19: 650 mg via ORAL

## 2020-05-19 MED ORDER — DIPHENHYDRAMINE HCL 50 MG/ML IJ SOLN
INTRAMUSCULAR | Status: AC
  Filled 2020-05-19: qty 1

## 2020-05-19 MED ORDER — SODIUM CHLORIDE 0.9 % IV SOLN
Freq: Once | INTRAVENOUS | Status: AC
  Filled 2020-05-19: qty 250

## 2020-05-19 MED ORDER — ACETAMINOPHEN 325 MG PO TABS
ORAL_TABLET | ORAL | Status: AC
  Filled 2020-05-19: qty 2

## 2020-05-19 MED ORDER — SODIUM CHLORIDE 0.9 % IV SOLN
10.0000 mg | Freq: Once | INTRAVENOUS | Status: AC
  Administered 2020-05-19: 10 mg via INTRAVENOUS
  Filled 2020-05-19: qty 10

## 2020-05-19 MED ORDER — SODIUM CHLORIDE 0.9 % IV SOLN
10.0000 mg/kg | Freq: Once | INTRAVENOUS | Status: AC
  Administered 2020-05-19: 900 mg via INTRAVENOUS
  Filled 2020-05-19: qty 50

## 2020-05-19 NOTE — Patient Instructions (Signed)
[facility] Discharge Instructions for Patients Receiving Chemotherapy  Today you received the following chemotherapy agents docetaxel, cyramza  To help prevent nausea and vomiting after your treatment, we encourage you to take your nausea medication as directed.   If you develop nausea and vomiting that is not controlled by your nausea medication, call the clinic.   BELOW ARE SYMPTOMS THAT SHOULD BE REPORTED IMMEDIATELY:  *FEVER GREATER THAN 100.5 F  *CHILLS WITH OR WITHOUT FEVER  NAUSEA AND VOMITING THAT IS NOT CONTROLLED WITH YOUR NAUSEA MEDICATION  *UNUSUAL SHORTNESS OF BREATH  *UNUSUAL BRUISING OR BLEEDING  TENDERNESS IN MOUTH AND THROAT WITH OR WITHOUT PRESENCE OF ULCERS  *URINARY PROBLEMS  *BOWEL PROBLEMS  UNUSUAL RASH Items with * indicate a potential emergency and should be followed up as soon as possible.  Feel free to call the clinic should you have any questions or concerns. The clinic phone number is [phone].  Please show the [facility] at check-in to the Emergency Department and triage nurse.

## 2020-05-21 ENCOUNTER — Other Ambulatory Visit: Payer: Self-pay

## 2020-05-21 ENCOUNTER — Inpatient Hospital Stay: Payer: Medicare HMO

## 2020-05-21 VITALS — BP 139/85 | HR 79 | Temp 97.5°F | Resp 17 | Ht 74.0 in

## 2020-05-21 DIAGNOSIS — Z5111 Encounter for antineoplastic chemotherapy: Secondary | ICD-10-CM | POA: Diagnosis not present

## 2020-05-21 DIAGNOSIS — Z7901 Long term (current) use of anticoagulants: Secondary | ICD-10-CM | POA: Diagnosis not present

## 2020-05-21 DIAGNOSIS — C3411 Malignant neoplasm of upper lobe, right bronchus or lung: Secondary | ICD-10-CM | POA: Diagnosis not present

## 2020-05-21 DIAGNOSIS — I4819 Other persistent atrial fibrillation: Secondary | ICD-10-CM | POA: Diagnosis not present

## 2020-05-21 DIAGNOSIS — Z7952 Long term (current) use of systemic steroids: Secondary | ICD-10-CM | POA: Diagnosis not present

## 2020-05-21 DIAGNOSIS — Z8673 Personal history of transient ischemic attack (TIA), and cerebral infarction without residual deficits: Secondary | ICD-10-CM | POA: Diagnosis not present

## 2020-05-21 DIAGNOSIS — I10 Essential (primary) hypertension: Secondary | ICD-10-CM | POA: Diagnosis not present

## 2020-05-21 DIAGNOSIS — E119 Type 2 diabetes mellitus without complications: Secondary | ICD-10-CM | POA: Diagnosis not present

## 2020-05-21 DIAGNOSIS — Z5189 Encounter for other specified aftercare: Secondary | ICD-10-CM | POA: Diagnosis not present

## 2020-05-21 DIAGNOSIS — Z5112 Encounter for antineoplastic immunotherapy: Secondary | ICD-10-CM | POA: Diagnosis not present

## 2020-05-21 MED ORDER — PEGFILGRASTIM-CBQV 6 MG/0.6ML ~~LOC~~ SOSY
6.0000 mg | PREFILLED_SYRINGE | Freq: Once | SUBCUTANEOUS | Status: AC
  Administered 2020-05-21: 6 mg via SUBCUTANEOUS

## 2020-05-23 ENCOUNTER — Encounter: Payer: Self-pay | Admitting: Internal Medicine

## 2020-05-23 DIAGNOSIS — E119 Type 2 diabetes mellitus without complications: Secondary | ICD-10-CM | POA: Diagnosis not present

## 2020-05-23 DIAGNOSIS — Z0100 Encounter for examination of eyes and vision without abnormal findings: Secondary | ICD-10-CM | POA: Diagnosis not present

## 2020-05-25 ENCOUNTER — Other Ambulatory Visit: Payer: Self-pay

## 2020-05-25 ENCOUNTER — Encounter: Payer: Self-pay | Admitting: Internal Medicine

## 2020-05-25 ENCOUNTER — Other Ambulatory Visit: Payer: Self-pay | Admitting: Medical Oncology

## 2020-05-25 ENCOUNTER — Inpatient Hospital Stay: Payer: Medicare HMO

## 2020-05-25 ENCOUNTER — Inpatient Hospital Stay: Payer: Medicare HMO | Admitting: Internal Medicine

## 2020-05-25 VITALS — BP 127/83 | HR 112 | Temp 98.7°F | Resp 19 | Ht 74.0 in | Wt 187.3 lb

## 2020-05-25 DIAGNOSIS — Z5111 Encounter for antineoplastic chemotherapy: Secondary | ICD-10-CM | POA: Diagnosis not present

## 2020-05-25 DIAGNOSIS — I10 Essential (primary) hypertension: Secondary | ICD-10-CM | POA: Diagnosis not present

## 2020-05-25 DIAGNOSIS — Z5112 Encounter for antineoplastic immunotherapy: Secondary | ICD-10-CM | POA: Diagnosis not present

## 2020-05-25 DIAGNOSIS — I4819 Other persistent atrial fibrillation: Secondary | ICD-10-CM | POA: Diagnosis not present

## 2020-05-25 DIAGNOSIS — Z8673 Personal history of transient ischemic attack (TIA), and cerebral infarction without residual deficits: Secondary | ICD-10-CM | POA: Diagnosis not present

## 2020-05-25 DIAGNOSIS — Z7952 Long term (current) use of systemic steroids: Secondary | ICD-10-CM | POA: Diagnosis not present

## 2020-05-25 DIAGNOSIS — C3411 Malignant neoplasm of upper lobe, right bronchus or lung: Secondary | ICD-10-CM

## 2020-05-25 DIAGNOSIS — R630 Anorexia: Secondary | ICD-10-CM

## 2020-05-25 DIAGNOSIS — Z5189 Encounter for other specified aftercare: Secondary | ICD-10-CM | POA: Diagnosis not present

## 2020-05-25 DIAGNOSIS — Z7901 Long term (current) use of anticoagulants: Secondary | ICD-10-CM | POA: Diagnosis not present

## 2020-05-25 DIAGNOSIS — E119 Type 2 diabetes mellitus without complications: Secondary | ICD-10-CM | POA: Diagnosis not present

## 2020-05-25 LAB — CMP (CANCER CENTER ONLY)
ALT: 21 U/L (ref 0–44)
AST: 20 U/L (ref 15–41)
Albumin: 3.5 g/dL (ref 3.5–5.0)
Alkaline Phosphatase: 88 U/L (ref 38–126)
Anion gap: 9 (ref 5–15)
BUN: 28 mg/dL — ABNORMAL HIGH (ref 8–23)
CO2: 22 mmol/L (ref 22–32)
Calcium: 9.4 mg/dL (ref 8.9–10.3)
Chloride: 108 mmol/L (ref 98–111)
Creatinine: 1.32 mg/dL — ABNORMAL HIGH (ref 0.61–1.24)
GFR, Estimated: 58 mL/min — ABNORMAL LOW (ref >60)
Glucose, Bld: 135 mg/dL — ABNORMAL HIGH (ref 70–99)
Potassium: 4.1 mmol/L (ref 3.5–5.1)
Sodium: 139 mmol/L (ref 135–145)
Total Bilirubin: 1.4 mg/dL — ABNORMAL HIGH (ref 0.3–1.2)
Total Protein: 7.3 g/dL (ref 6.5–8.1)

## 2020-05-25 LAB — CBC WITH DIFFERENTIAL (CANCER CENTER ONLY)
Abs Immature Granulocytes: 0.1 10*3/uL — ABNORMAL HIGH (ref 0.00–0.07)
Band Neutrophils: 6 %
Basophils Absolute: 0 10*3/uL (ref 0.0–0.1)
Basophils Relative: 0 %
Eosinophils Absolute: 0 10*3/uL (ref 0.0–0.5)
Eosinophils Relative: 0 %
HCT: 31.4 % — ABNORMAL LOW (ref 39.0–52.0)
Hemoglobin: 10.4 g/dL — ABNORMAL LOW (ref 13.0–17.0)
Lymphocytes Relative: 28 %
Lymphs Abs: 0.6 10*3/uL — ABNORMAL LOW (ref 0.7–4.0)
MCH: 33.4 pg (ref 26.0–34.0)
MCHC: 33.1 g/dL (ref 30.0–36.0)
MCV: 101 fL — ABNORMAL HIGH (ref 80.0–100.0)
Metamyelocytes Relative: 4 %
Monocytes Absolute: 0.4 10*3/uL (ref 0.1–1.0)
Monocytes Relative: 17 %
Myelocytes: 2 %
Neutro Abs: 1 10*3/uL — ABNORMAL LOW (ref 1.7–7.7)
Neutrophils Relative %: 43 %
Platelet Count: 151 10*3/uL (ref 150–400)
RBC: 3.11 MIL/uL — ABNORMAL LOW (ref 4.22–5.81)
RDW: 14.2 % (ref 11.5–15.5)
WBC Count: 2.1 10*3/uL — ABNORMAL LOW (ref 4.0–10.5)
nRBC: 0 % (ref 0.0–0.2)

## 2020-05-25 LAB — TSH: TSH: 2.023 u[IU]/mL (ref 0.320–4.118)

## 2020-05-25 NOTE — Progress Notes (Signed)
[place] [phone]   [phone]  OFFICE PROGRESS NOTE  Oliver, Micheal Filler, MD [address] [place] [address]  DIAGNOSIS: Stage IV (T3, N2, M1a) non-small cell lung cancer, adenocarcinoma presented with large right upper lobe lung mass extending to the right suprahilar region in addition to right hilar and mediastinal lymphadenopathy with obstruction of the associated upper lobe bronchus and bilateral subcentimeter pulmonary nodules diagnosed in July 2021.  Biomarker Findings Microsatellite status - MS-Stable Tumor Mutational Burden - 3 Muts/Mb Genomic Findings For a complete list of the genes assayed, please refer to the Appendix. ERBB2 [id number], amplification - equivocal? HGF amplification - equivocal? MTAP loss exons 5-8 MYC amplification - equivocal? CDKN2A/B CDKN2B loss, CDKN2A loss CUL3 Y45f*19 RPTOR amplification TP53 [id number] Disease relevant genes with no reportable alterations: ALK, BRAF, EGFR, KRAS, MET, RET, ROS1  PDL1 Expression: Negative  PRIOR THERAPY:  1) Palliative radiotherapy to the obstructive lung mass under the care of Dr. MoLisbeth RenshawThe past treatment is scheduled for8/23/21. 2) Systemic chemotherapy with carboplatin for AUC of 5, Alimta 500 mg/M2 and Keytruda 200 mg IV every 3 weeks.  First dose November 04, 2019.  Status post 9 cycles.  Starting from cycle #5 he will be on maintenance treatment with Alimta and Keytruda every 3 weeks.  CURRENT THERAPY: Second line systemic chemotherapy with docetaxel 75 mg/M2 and Cyramza 10 mg/KG every weeks with Neulasta support.  First dose May 18, 2020.  Status post 1 cycle.  INTERVAL HISTORY: WiAtharva Mirsky70.o. male returns to the clinic today for follow-up visit.  The patient complains of increasing fatigue and aching all over his body after the Neulasta injection.  He took Claritin for 2 days with some improvement.  He also had hematuria  for 2 days but this completely resolved.  He denied having any current chest pain, shortness of breath, cough or hemoptysis.  He denied having any fever or chills.  He has no nausea, vomiting, diarrhea or constipation.  He has no headache or visual changes.  He is here today for evaluation and repeat blood work.   MEDICAL HISTORY: Past Medical History:  Diagnosis Date  . Chicken pox   . DM2 (diabetes mellitus, type 2) ([facility]  . History of kidney stones    passed several   . Hypertension   . Kidney stones    5-6 times  . Measles   . Mumps   . Stroke ([facility]   x 2, last one was 09/18/2019- has some expressive aphasia    ALLERGIES:  is allergic to bee venom.  MEDICATIONS:  Current Outpatient Medications  Medication Sig Dispense Refill  . acetaminophen (TYLENOL) 500 MG tablet Take 500 mg by mouth every 6 (six) hours as needed.     . Marland Kitchenpixaban (ELIQUIS) 5 MG TABS tablet Take 1 tablet (5 mg total) by mouth 2 (two) times daily. 60 tablet 11  . atorvastatin (LIPITOR) 40 MG tablet Take 1 tablet (40 mg total) by mouth daily at 6 PM. 90 tablet 3  . dexamethasone (DECADRON) 4 MG tablet 2 tablet p.o. twice daily the day before, day of and day after chemotherapy every 3 weeks. 40 tablet 1  . folic acid (FOLVITE) 1 MG tablet Take 1 tablet (1 mg total) by mouth daily. 90 tablet 3  . gabapentin (NEURONTIN) 300 MG capsule Take 1 capsule (300 mg total) by mouth at bedtime. 90 capsule 3  .  HYDROcodone-homatropine (HYCODAN) 5-1.5 MG/5ML syrup Take 5 mLs by mouth every 6 (six) hours as needed for cough. 120 mL 0  . metFORMIN (GLUCOPHAGE) 500 MG tablet TAKE TWO TABLETS BY MOUTH TWICE A DAY WITH MEAL(Oliver) 360 tablet 3  . Omega-3 Fatty Acids (FISH OIL) 1200 MG CAPS Take by mouth daily.    . prochlorperazine (COMPAZINE) 10 MG tablet Take 1 tablet (10 mg total) by mouth every 6 (six) hours as needed. 30 tablet 2  . sitaGLIPtin (JANUVIA) 100 MG tablet Take 1 tablet (100 mg total) by mouth daily. 90 tablet 3   No  current facility-administered medications for this visit.    SURGICAL HISTORY:  Past Surgical History:  Procedure Laterality Date  . COLONOSCOPY W/ POLYPECTOMY    . VIDEO BRONCHOSCOPY WITH ENDOBRONCHIAL ULTRASOUND N/A 09/30/2019   Procedure: VIDEO BRONCHOSCOPY WITH ENDOBRONCHIAL ULTRASOUND;  Surgeon: Byrum, Robert S, MD;  Location: [facility] OR;  Service: Thoracic;  Laterality: N/A;  . WISDOM TOOTH EXTRACTION  1976-77    REVIEW OF SYSTEMS:  A comprehensive review of systems was negative except for: Constitutional: positive for fatigue Musculoskeletal: positive for arthralgias   PHYSICAL EXAMINATION: General appearance: alert, cooperative, fatigued and no distress Head: Normocephalic, without obvious abnormality, atraumatic Neck: no adenopathy, no JVD, supple, symmetrical, trachea midline and thyroid not enlarged, symmetric, no tenderness/mass/nodules Lymph nodes: Cervical, supraclavicular, and axillary nodes normal. Resp: clear to auscultation bilaterally Back: symmetric, no curvature. ROM normal. No CVA tenderness. Cardio: regular rate and rhythm, S1, S2 normal, no murmur, click, rub or gallop GI: soft, non-tender; bowel sounds normal; no masses,  no organomegaly Extremities: extremities normal, atraumatic, no cyanosis or edema  ECOG PERFORMANCE STATUS: 1 - Symptomatic but completely ambulatory  Blood pressure 127/83, pulse (!) 112, temperature 98.7 F (37.1 C), temperature source Tympanic, resp. rate 19, height 6' 2" (1.88 m), weight 187 lb 4.8 oz (85 kg), SpO2 99 %.  LABORATORY DATA: Lab Results  Component Value Date   WBC 2.1 (L) 05/25/2020   HGB 10.4 (L) 05/25/2020   HCT 31.4 (L) 05/25/2020   MCV 101.0 (H) 05/25/2020   PLT 151 05/25/2020      Chemistry      Component Value Date/Time   NA 139 05/19/2020 0747   K 4.1 05/19/2020 0747   CL 107 05/19/2020 0747   CO2 19 (L) 05/19/2020 0747   BUN 27 (H) 05/19/2020 0747   CREATININE 1.43 (H) 05/19/2020 0747   CREATININE 1.13  10/16/2013 1118      Component Value Date/Time   CALCIUM 9.2 05/19/2020 0747   ALKPHOS 84 05/19/2020 0747   AST 20 05/19/2020 0747   ALT 22 05/19/2020 0747   BILITOT 0.5 05/19/2020 0747       RADIOGRAPHIC STUDIES: CT Chest W Contrast  Result Date: 05/10/2020 CLINICAL DATA:  Primary Cancer Type: Lung Imaging Indication: Assess response to therapy Interval therapy since last imaging? Yes Initial Cancer Diagnosis Date: 06/30/2021l Established by: Biopsy-proven Detailed Pathology: Stage IV non-small cell lung cancer, adenocarcinoma. Primary Tumor location:  Right upper lobe.  Left lower lobe nodule. Surgeries: No. Chemotherapy: Yes; Ongoing? Yes; Most recent administration: 04/20/2020 Immunotherapy?  Yes; Type: Keytruda; ongoing? Yes Radiation therapy? Yes; Date Range: 11/03/2019 - 11/23/2019; Target: Right upper lung. EXAM: CT CHEST, ABDOMEN, AND PELVIS WITH CONTRAST TECHNIQUE: Multidetector CT imaging of the chest, abdomen and pelvis was performed following the standard protocol during bolus administration of intravenous contrast. CONTRAST:  100mL OMNIPAQUE IOHEXOL 300 MG/ML SOLN, additional oral enteric contrast COMPARISON:  Most   recent CT chest, abdomen and pelvis 03/08/2020. 10/26/2019 PET-CT. FINDINGS: CT CHEST FINDINGS Cardiovascular: No significant vascular findings. Normal heart size. No pericardial effusion. Mediastinum/Nodes: Unchanged post treatment appearance of soft tissue about the right hilum (series 2, image 29). No discretely enlarged mediastinal, hilar, or axillary lymph nodes. Thyroid gland, trachea, and esophagus demonstrate no significant findings. Lungs/Pleura: Moderate right pleural effusion, increased compared to prior examination. Significant interval increase in now very dense, masslike consolidation of the posterior right lower lobe (series 6, image 94). No significant change in suprahilar and paramedian consolidation and fibrosis about a mass of the medial right upper lobe,  axial component measuring approximately 3.9 x 2.3 cm (series 6, image 63). Slight interval increase in minimal radiation fibrosis of the medial left upper lobe (series 6, image 69). There are numerous small bilateral pulmonary nodules, which are slightly increased in size compared to prior examination, an index nodule of the superior segment left lower lobe measuring 7 mm, previously 5 mm (series 6, image 81), and index nodule of the central right middle lobe measuring 6 mm, previously 4 mm (series 6, image 96). The largest nodule in the peripheral left lower lobe measures 1.1 x 0.9 cm, previously 7 mm (series 6, image 116). Musculoskeletal: No chest wall mass or suspicious bone lesions identified. CT ABDOMEN PELVIS FINDINGS Hepatobiliary: No solid liver abnormality is seen. No gallstones, gallbladder wall thickening, or biliary dilatation. Pancreas: Unremarkable. No pancreatic ductal dilatation or surrounding inflammatory changes. Spleen: Normal in size without significant abnormality. Adrenals/Urinary Tract: Adrenal glands are unremarkable. Tiny nonobstructive calculus of the superior pole of the right kidney (series 2, image 76) and inferior pole of the left kidney (series 4, image 94). Redemonstrated 1.2 cm calculus within the posterior right aspect of the bladder. Stomach/Bowel: Stomach is within normal limits. Diverticulum of the transverse portion of the duodenum. Appendix appears normal. No evidence of bowel wall thickening, distention, or inflammatory changes. Severe sigmoid diverticulosis. Vascular/Lymphatic: No significant vascular findings are present. No enlarged abdominal or pelvic lymph nodes. Reproductive: No mass or other abnormality. Other: Fat containing left inguinal hernia. No abdominopelvic ascites. Musculoskeletal: No acute or significant osseous findings. IMPRESSION: 1. There are numerous small bilateral pulmonary nodules, slightly increased in size compared to prior examination and  consistent with worsened pulmonary metastatic disease. 2. Significant interval increase in now very dense, masslike consolidation of the posterior right lower lobe. Generally favor developing radiation fibrosis given appearance. Attention on follow-up. 3. Unchanged post treatment appearance of paramedian right upper lobe mass and the right hilum. 4. No discretely enlarged mediastinal or hilar lymph nodes. 5. No evidence of distant metastatic disease within the abdomen or pelvis. 6. Nonobstructive bilateral nephrolithiasis. Redemonstrated 1.2 cm calculus within the posterior right aspect of the bladder. No hydronephrosis. Electronically Signed   By: Micheal  Oliver M.D.   On: 05/10/2020 11:54   CT Abdomen Pelvis W Contrast  Result Date: 05/10/2020 CLINICAL DATA:  Primary Cancer Type: Lung Imaging Indication: Assess response to therapy Interval therapy since last imaging? Yes Initial Cancer Diagnosis Date: 06/30/2021l Established by: Biopsy-proven Detailed Pathology: Stage IV non-small cell lung cancer, adenocarcinoma. Primary Tumor location:  Right upper lobe.  Left lower lobe nodule. Surgeries: No. Chemotherapy: Yes; Ongoing? Yes; Most recent administration: 04/20/2020 Immunotherapy?  Yes; Type: Keytruda; ongoing? Yes Radiation therapy? Yes; Date Range: 11/03/2019 - 11/23/2019; Target: Right upper lung. EXAM: CT CHEST, ABDOMEN, AND PELVIS WITH CONTRAST TECHNIQUE: Multidetector CT imaging of the chest, abdomen and pelvis was performed following the standard protocol   during bolus administration of intravenous contrast. CONTRAST:  100mL OMNIPAQUE IOHEXOL 300 MG/ML SOLN, additional oral enteric contrast COMPARISON:  Most recent CT chest, abdomen and pelvis 03/08/2020. 10/26/2019 PET-CT. FINDINGS: CT CHEST FINDINGS Cardiovascular: No significant vascular findings. Normal heart size. No pericardial effusion. Mediastinum/Nodes: Unchanged post treatment appearance of soft tissue about the right hilum (series 2, image 29).  No discretely enlarged mediastinal, hilar, or axillary lymph nodes. Thyroid gland, trachea, and esophagus demonstrate no significant findings. Lungs/Pleura: Moderate right pleural effusion, increased compared to prior examination. Significant interval increase in now very dense, masslike consolidation of the posterior right lower lobe (series 6, image 94). No significant change in suprahilar and paramedian consolidation and fibrosis about a mass of the medial right upper lobe, axial component measuring approximately 3.9 x 2.3 cm (series 6, image 63). Slight interval increase in minimal radiation fibrosis of the medial left upper lobe (series 6, image 69). There are numerous small bilateral pulmonary nodules, which are slightly increased in size compared to prior examination, an index nodule of the superior segment left lower lobe measuring 7 mm, previously 5 mm (series 6, image 81), and index nodule of the central right middle lobe measuring 6 mm, previously 4 mm (series 6, image 96). The largest nodule in the peripheral left lower lobe measures 1.1 x 0.9 cm, previously 7 mm (series 6, image 116). Musculoskeletal: No chest wall mass or suspicious bone lesions identified. CT ABDOMEN PELVIS FINDINGS Hepatobiliary: No solid liver abnormality is seen. No gallstones, gallbladder wall thickening, or biliary dilatation. Pancreas: Unremarkable. No pancreatic ductal dilatation or surrounding inflammatory changes. Spleen: Normal in size without significant abnormality. Adrenals/Urinary Tract: Adrenal glands are unremarkable. Tiny nonobstructive calculus of the superior pole of the right kidney (series 2, image 76) and inferior pole of the left kidney (series 4, image 94). Redemonstrated 1.2 cm calculus within the posterior right aspect of the bladder. Stomach/Bowel: Stomach is within normal limits. Diverticulum of the transverse portion of the duodenum. Appendix appears normal. No evidence of bowel wall thickening,  distention, or inflammatory changes. Severe sigmoid diverticulosis. Vascular/Lymphatic: No significant vascular findings are present. No enlarged abdominal or pelvic lymph nodes. Reproductive: No mass or other abnormality. Other: Fat containing left inguinal hernia. No abdominopelvic ascites. Musculoskeletal: No acute or significant osseous findings. IMPRESSION: 1. There are numerous small bilateral pulmonary nodules, slightly increased in size compared to prior examination and consistent with worsened pulmonary metastatic disease. 2. Significant interval increase in now very dense, masslike consolidation of the posterior right lower lobe. Generally favor developing radiation fibrosis given appearance. Attention on follow-up. 3. Unchanged post treatment appearance of paramedian right upper lobe mass and the right hilum. 4. No discretely enlarged mediastinal or hilar lymph nodes. 5. No evidence of distant metastatic disease within the abdomen or pelvis. 6. Nonobstructive bilateral nephrolithiasis. Redemonstrated 1.2 cm calculus within the posterior right aspect of the bladder. No hydronephrosis. Electronically Signed   By: Micheal  Oliver M.D.   On: 05/10/2020 11:54   CUP PACEART REMOTE DEVICE CHECK  Result Date: 04/26/2020 ILR for AF, ongoing with onset 04/25/20, rates ok (although higher than average). OAC- Eliquis. Routing for further review of persistent AF. Reviewed presenting rhythm is rate controlled AF Episode detail looks like perhaps caurse AFib vs AFlutter, some appear to have sinus beats as well. Rates low 100'Oliver pt known AFib recently started on Eliquis. Called patient to discuss and assess any symptoms.  Left a message with DC direct number. Recommend clinic visit with AFib clinic or office   in the next couple days if asymptomatic, to assess rate and or rhythm control needs Tommye Standard, PA-C   ASSESSMENT AND PLAN: This is a very pleasant 70 years old white male recently diagnosed with a stage IV (T3,  N2, M1 a) non-small cell lung cancer, adenocarcinoma presented with large right upper lobe lung mass with extension to the right suprahilar region as well as right hilar and mediastinal lymphadenopathy with obstruction of the right upper lobe and bilateral pulmonary nodules diagnosed in July 2021. The molecular study shows no actionable mutations and PD-L1 expression is negative. He had a short course of palliative radiotherapy to the obstructive lung mass under the care of Dr. Lisbeth Oliver. The patient underwent systemic chemotherapy with carboplatin for AUC of 5, Alimta 500 mg/M2 and Keytruda 200 mg IV every 3 weeks status post 9 cycles.  Starting from cycle #5 he will be on maintenance treatment with Alimta and Keytruda every 3 weeks.  He has been tolerating this treatment well with no concerning complaints except for fatigue and cough.  This treatment was discontinued secondary to disease progression. The patient is currently undergoing palliative second line systemic chemotherapy with docetaxel 75 mg/M2 and Cyramza 10 mg/KG every 3 weeks with Neulasta support.  Started May 18, 2020.  Status post 1 cycle. He has a rough time the first week after his treatment with increasing fatigue and weakness as well as arthralgia and 2 days of hematuria.  He is feeling much better except for the aching pain.  This is likely from the Neulasta injection. Regarding the hematuria of the patient has any other bleeding issues, I will discontinue his current treatment with Cyramza.  He is currently on Eliquis for cardiac condition. He will come back for follow-up visit in 2 weeks for evaluation before the next cycle of his treatment. For the thyroid malignancy, he was referred to Dr. Harlow Asa for evaluation and surgical intervention. The patient was advised to call immediately if he has any concerning symptoms in the interval. The patient voices understanding of current disease status and treatment options and is in agreement  with the current care plan.  All questions were answered. The patient knows to call the clinic with any problems, questions or concerns. We can certainly see the patient much sooner if necessary.  Disclaimer: This note was dictated with voice recognition software. Similar sounding words can inadvertently be transcribed and may not be corrected upon review.

## 2020-05-26 ENCOUNTER — Telehealth: Payer: Self-pay | Admitting: Internal Medicine

## 2020-05-26 LAB — CUP PACEART REMOTE DEVICE CHECK
Date Time Interrogation Session: 20220224005545
Implantable Pulse Generator Implant Date: 20210907

## 2020-05-26 NOTE — Telephone Encounter (Signed)
Scheduled appts per 2/23 los. Pt to get updated appt calendar at next visit per appt notes.

## 2020-05-30 ENCOUNTER — Ambulatory Visit (INDEPENDENT_AMBULATORY_CARE_PROVIDER_SITE_OTHER): Payer: Medicare HMO

## 2020-05-30 DIAGNOSIS — I639 Cerebral infarction, unspecified: Secondary | ICD-10-CM | POA: Diagnosis not present

## 2020-06-01 ENCOUNTER — Inpatient Hospital Stay: Payer: Medicare HMO | Attending: Internal Medicine

## 2020-06-01 ENCOUNTER — Ambulatory Visit: Payer: Medicare HMO | Admitting: Physician Assistant

## 2020-06-01 ENCOUNTER — Ambulatory Visit: Payer: Medicare HMO

## 2020-06-01 ENCOUNTER — Other Ambulatory Visit: Payer: Medicare HMO

## 2020-06-01 ENCOUNTER — Other Ambulatory Visit: Payer: Self-pay

## 2020-06-01 DIAGNOSIS — I10 Essential (primary) hypertension: Secondary | ICD-10-CM | POA: Diagnosis not present

## 2020-06-01 DIAGNOSIS — C3411 Malignant neoplasm of upper lobe, right bronchus or lung: Secondary | ICD-10-CM

## 2020-06-01 DIAGNOSIS — Z5189 Encounter for other specified aftercare: Secondary | ICD-10-CM | POA: Diagnosis not present

## 2020-06-01 DIAGNOSIS — Z8673 Personal history of transient ischemic attack (TIA), and cerebral infarction without residual deficits: Secondary | ICD-10-CM | POA: Insufficient documentation

## 2020-06-01 DIAGNOSIS — Z7984 Long term (current) use of oral hypoglycemic drugs: Secondary | ICD-10-CM | POA: Diagnosis not present

## 2020-06-01 DIAGNOSIS — Z7901 Long term (current) use of anticoagulants: Secondary | ICD-10-CM | POA: Insufficient documentation

## 2020-06-01 DIAGNOSIS — E119 Type 2 diabetes mellitus without complications: Secondary | ICD-10-CM | POA: Diagnosis not present

## 2020-06-01 DIAGNOSIS — Z79899 Other long term (current) drug therapy: Secondary | ICD-10-CM | POA: Diagnosis not present

## 2020-06-01 DIAGNOSIS — Z923 Personal history of irradiation: Secondary | ICD-10-CM | POA: Insufficient documentation

## 2020-06-01 DIAGNOSIS — Z5112 Encounter for antineoplastic immunotherapy: Secondary | ICD-10-CM | POA: Diagnosis present

## 2020-06-01 LAB — CBC WITH DIFFERENTIAL (CANCER CENTER ONLY)
Abs Immature Granulocytes: 0.61 10*3/uL — ABNORMAL HIGH (ref 0.00–0.07)
Basophils Absolute: 0.1 10*3/uL (ref 0.0–0.1)
Basophils Relative: 1 %
Eosinophils Absolute: 0.1 10*3/uL (ref 0.0–0.5)
Eosinophils Relative: 0 %
HCT: 31.4 % — ABNORMAL LOW (ref 39.0–52.0)
Hemoglobin: 10.1 g/dL — ABNORMAL LOW (ref 13.0–17.0)
Immature Granulocytes: 4 %
Lymphocytes Relative: 5 %
Lymphs Abs: 0.8 10*3/uL (ref 0.7–4.0)
MCH: 32.9 pg (ref 26.0–34.0)
MCHC: 32.2 g/dL (ref 30.0–36.0)
MCV: 102.3 fL — ABNORMAL HIGH (ref 80.0–100.0)
Monocytes Absolute: 0.9 10*3/uL (ref 0.1–1.0)
Monocytes Relative: 6 %
Neutro Abs: 13.2 10*3/uL — ABNORMAL HIGH (ref 1.7–7.7)
Neutrophils Relative %: 84 %
Platelet Count: 145 10*3/uL — ABNORMAL LOW (ref 150–400)
RBC: 3.07 MIL/uL — ABNORMAL LOW (ref 4.22–5.81)
RDW: 15 % (ref 11.5–15.5)
WBC Count: 15.7 10*3/uL — ABNORMAL HIGH (ref 4.0–10.5)
nRBC: 0 % (ref 0.0–0.2)

## 2020-06-01 LAB — CMP (CANCER CENTER ONLY)
ALT: 17 U/L (ref 0–44)
AST: 17 U/L (ref 15–41)
Albumin: 3.1 g/dL — ABNORMAL LOW (ref 3.5–5.0)
Alkaline Phosphatase: 112 U/L (ref 38–126)
Anion gap: 11 (ref 5–15)
BUN: 22 mg/dL (ref 8–23)
CO2: 21 mmol/L — ABNORMAL LOW (ref 22–32)
Calcium: 9 mg/dL (ref 8.9–10.3)
Chloride: 109 mmol/L (ref 98–111)
Creatinine: 1.43 mg/dL — ABNORMAL HIGH (ref 0.61–1.24)
GFR, Estimated: 53 mL/min — ABNORMAL LOW (ref >60)
Glucose, Bld: 153 mg/dL — ABNORMAL HIGH (ref 70–99)
Potassium: 3.6 mmol/L (ref 3.5–5.1)
Sodium: 141 mmol/L (ref 135–145)
Total Bilirubin: 0.6 mg/dL (ref 0.3–1.2)
Total Protein: 6.8 g/dL (ref 6.5–8.1)

## 2020-06-06 NOTE — Progress Notes (Signed)
[facility] Summary Report / Loop Recorder 

## 2020-06-08 ENCOUNTER — Other Ambulatory Visit: Payer: Self-pay

## 2020-06-08 ENCOUNTER — Inpatient Hospital Stay (HOSPITAL_BASED_OUTPATIENT_CLINIC_OR_DEPARTMENT_OTHER): Payer: Medicare HMO | Admitting: Internal Medicine

## 2020-06-08 ENCOUNTER — Inpatient Hospital Stay: Payer: Medicare HMO

## 2020-06-08 ENCOUNTER — Encounter: Payer: Self-pay | Admitting: Nutrition

## 2020-06-08 ENCOUNTER — Encounter: Payer: Self-pay | Admitting: Internal Medicine

## 2020-06-08 ENCOUNTER — Inpatient Hospital Stay: Payer: Medicare HMO | Admitting: Nutrition

## 2020-06-08 VITALS — BP 117/78 | HR 128 | Temp 99.1°F | Resp 18 | Ht 74.0 in | Wt 189.4 lb

## 2020-06-08 DIAGNOSIS — Z923 Personal history of irradiation: Secondary | ICD-10-CM | POA: Diagnosis not present

## 2020-06-08 DIAGNOSIS — Z5112 Encounter for antineoplastic immunotherapy: Secondary | ICD-10-CM | POA: Diagnosis not present

## 2020-06-08 DIAGNOSIS — C3411 Malignant neoplasm of upper lobe, right bronchus or lung: Secondary | ICD-10-CM

## 2020-06-08 DIAGNOSIS — Z8673 Personal history of transient ischemic attack (TIA), and cerebral infarction without residual deficits: Secondary | ICD-10-CM | POA: Diagnosis not present

## 2020-06-08 DIAGNOSIS — C3490 Malignant neoplasm of unspecified part of unspecified bronchus or lung: Secondary | ICD-10-CM

## 2020-06-08 DIAGNOSIS — E119 Type 2 diabetes mellitus without complications: Secondary | ICD-10-CM | POA: Diagnosis not present

## 2020-06-08 DIAGNOSIS — R059 Cough, unspecified: Secondary | ICD-10-CM

## 2020-06-08 DIAGNOSIS — Z7901 Long term (current) use of anticoagulants: Secondary | ICD-10-CM | POA: Diagnosis not present

## 2020-06-08 DIAGNOSIS — Z5111 Encounter for antineoplastic chemotherapy: Secondary | ICD-10-CM | POA: Diagnosis not present

## 2020-06-08 DIAGNOSIS — Z7984 Long term (current) use of oral hypoglycemic drugs: Secondary | ICD-10-CM | POA: Diagnosis not present

## 2020-06-08 DIAGNOSIS — Z79899 Other long term (current) drug therapy: Secondary | ICD-10-CM | POA: Diagnosis not present

## 2020-06-08 DIAGNOSIS — I10 Essential (primary) hypertension: Secondary | ICD-10-CM | POA: Diagnosis not present

## 2020-06-08 DIAGNOSIS — Z5189 Encounter for other specified aftercare: Secondary | ICD-10-CM | POA: Diagnosis not present

## 2020-06-08 LAB — TSH: TSH: 1.188 u[IU]/mL (ref 0.320–4.118)

## 2020-06-08 LAB — CBC WITH DIFFERENTIAL (CANCER CENTER ONLY)
Abs Immature Granulocytes: 0.13 10*3/uL — ABNORMAL HIGH (ref 0.00–0.07)
Basophils Absolute: 0.1 10*3/uL (ref 0.0–0.1)
Basophils Relative: 1 %
Eosinophils Absolute: 0.1 10*3/uL (ref 0.0–0.5)
Eosinophils Relative: 1 %
HCT: 32.9 % — ABNORMAL LOW (ref 39.0–52.0)
Hemoglobin: 10.7 g/dL — ABNORMAL LOW (ref 13.0–17.0)
Immature Granulocytes: 1 %
Lymphocytes Relative: 3 %
Lymphs Abs: 0.5 10*3/uL — ABNORMAL LOW (ref 0.7–4.0)
MCH: 33.4 pg (ref 26.0–34.0)
MCHC: 32.5 g/dL (ref 30.0–36.0)
MCV: 102.8 fL — ABNORMAL HIGH (ref 80.0–100.0)
Monocytes Absolute: 0.9 10*3/uL (ref 0.1–1.0)
Monocytes Relative: 6 %
Neutro Abs: 14.3 10*3/uL — ABNORMAL HIGH (ref 1.7–7.7)
Neutrophils Relative %: 88 %
Platelet Count: 244 10*3/uL (ref 150–400)
RBC: 3.2 MIL/uL — ABNORMAL LOW (ref 4.22–5.81)
RDW: 15 % (ref 11.5–15.5)
WBC Count: 16.1 10*3/uL — ABNORMAL HIGH (ref 4.0–10.5)
nRBC: 0 % (ref 0.0–0.2)

## 2020-06-08 LAB — CMP (CANCER CENTER ONLY)
ALT: 13 U/L (ref 0–44)
AST: 16 U/L (ref 15–41)
Albumin: 3.1 g/dL — ABNORMAL LOW (ref 3.5–5.0)
Alkaline Phosphatase: 93 U/L (ref 38–126)
Anion gap: 12 (ref 5–15)
BUN: 20 mg/dL (ref 8–23)
CO2: 22 mmol/L (ref 22–32)
Calcium: 9.6 mg/dL (ref 8.9–10.3)
Chloride: 105 mmol/L (ref 98–111)
Creatinine: 1.3 mg/dL — ABNORMAL HIGH (ref 0.61–1.24)
GFR, Estimated: 59 mL/min — ABNORMAL LOW (ref >60)
Glucose, Bld: 175 mg/dL — ABNORMAL HIGH (ref 70–99)
Potassium: 4.5 mmol/L (ref 3.5–5.1)
Sodium: 139 mmol/L (ref 135–145)
Total Bilirubin: 0.8 mg/dL (ref 0.3–1.2)
Total Protein: 7.2 g/dL (ref 6.5–8.1)

## 2020-06-08 LAB — LACTATE DEHYDROGENASE: LDH: 233 U/L — ABNORMAL HIGH (ref 98–192)

## 2020-06-08 MED ORDER — AMOXICILLIN-POT CLAVULANATE 875-125 MG PO TABS: 1.0000 | ORAL_TABLET | Freq: Two times a day (BID) | ORAL | 0 refills | Status: DC

## 2020-06-08 MED ORDER — HYDROCODONE-HOMATROPINE 5-1.5 MG/5ML PO SYRP: 5.0000 mL | ORAL_SOLUTION | Freq: Four times a day (QID) | ORAL | 0 refills | Status: DC | PRN

## 2020-06-08 NOTE — Progress Notes (Signed)
Nutrition follow up not completed due to oncology treatment plan changes. Chemotherapy was cancelled so nutrition follow up not done. Patient has been rescheduled on March 30 during next infusion.

## 2020-06-08 NOTE — Progress Notes (Signed)
[place] [phone]   [phone]  OFFICE PROGRESS NOTE  Cop[place], Gay Filler, MD [address] [place] [address]  DIAGNOSIS: Stage IV (T3, N2, M1a) non-small cell lung cancer, adenocarcinoma presented with large right upper lobe lung mass extending to the right suprahilar region in addition to right hilar and mediastinal lymphadenopathy with obstruction of the associated upper lobe bronchus and bilateral subcentimeter pulmonary nodules diagnosed in July 2021.  Biomarker Findings Microsatellite status - MS-Stable Tumor Mutational Burden - 3 Muts/Mb Genomic Findings For a complete list of the genes assayed, please refer to the Appendix. ERBB2 [id number], amplification - equivocal? HGF amplification - equivocal? MTAP loss exons 5-8 MYC amplification - equivocal? CDKN2A/B CDKN2B loss, CDKN2A loss CUL3 Y89f*19 RPTOR amplification TP53 [id number] Disease relevant genes with no reportable alterations: ALK, BRAF, EGFR, KRAS, MET, RET, ROS1  PDL1 Expression: Negative  PRIOR THERAPY:  1) Palliative radiotherapy to the obstructive lung mass under the care of Dr. MoLisbeth RenshawThe past treatment is scheduled for8/23/21. 2) Systemic chemotherapy with carboplatin for AUC of 5, Alimta 500 mg/M2 and Keytruda 200 mg IV every 3 weeks.  First dose November 04, 2019.  Status post 9 cycles.  Starting from cycle #5 he will be on maintenance treatment with Alimta and Keytruda every 3 weeks.  CURRENT THERAPY: Second line systemic chemotherapy with docetaxel 75 mg/M2 and Cyramza 10 mg/KG every weeks with Neulasta support.  First dose May 18, 2020.  Status post 1 cycle.  INTERVAL HISTORY: Micheal Junkins89.o. male returns to the clinic today for follow-up visit accompanied by his wife.  The patient has been complaining of increasing fatigue and weakness as well as cough and shortness of breath with exertion.  He denied having any chest  pain or hemoptysis.  He has no nausea, vomiting, diarrhea or constipation.  He denied having any headache or visual changes.  He has low-grade fever today and was tachycardic.  He did not feel well the last few weeks after the first cycle of his chemotherapy.  He is here today for reevaluation before starting cycle #2.   MEDICAL HISTORY: Past Medical History:  Diagnosis Date  . Chicken pox   . DM2 (diabetes mellitus, type 2) ([facility]  . History of kidney stones    passed several   . Hypertension   . Kidney stones    5-6 times  . Measles   . Mumps   . [place]   [place], last one was 09/18/2019- has some expressive aphasia    ALLERGIES:  is allergic to bee venom.  MEDICATIONS:  Current Outpatient Medications  Medication Sig Dispense Refill  . acetaminophen (TYLENOL) 500 MG tablet Take 500 mg by mouth every 6 (six) hours as needed.     . [place] Kitchenpixaban (ELIQUIS) 5 MG TABS tablet Take 1 tablet (5 mg total) by mouth 2 (two) times daily. 60 tablet 11  . atorvastatin (LIPITOR) 40 MG tablet Take 1 tablet (40 mg total) by mouth daily at 6 PM. 90 tablet 3  . dexamethasone (DECADRON) 4 MG tablet 2 tablet p.o. twice daily the day before, day of and day after chemotherapy every 3 weeks. 40 tablet 1  . folic acid (FOLVITE) 1 MG tablet Take 1 tablet (1 mg total) by mouth daily. 90 tablet 3  . gabapentin (NEURONTIN) 300 MG capsule Take 1 capsule (300 mg total) by mouth at bedtime. 90 capsule 3  . HYDROcodone-homatropine (  HYCODAN) 5-1.5 MG/5ML syrup Take 5 mLs by mouth every 6 (six) hours as needed for cough. 120 mL 0  . metFORMIN (GLUCOPHAGE) 500 MG tablet TAKE TWO TABLETS BY MOUTH TWICE A DAY WITH MEAL(S) 360 tablet 3  . Omega-3 Fatty Acids (FISH OIL) 1200 MG CAPS Take by mouth daily.    . prochlorperazine (COMPAZINE) 10 MG tablet Take 1 tablet (10 mg total) by mouth every 6 (six) hours as needed. 30 tablet 2  . sitaGLIPtin (JANUVIA) 100 MG tablet Take 1 tablet (100 mg total) by mouth daily. 90 tablet 3    No current facility-administered medications for this visit.    SURGICAL HISTORY:  Past Surgical History:  Procedure Laterality Date  . COLONOSCOPY W/ POLYPECTOMY    . VIDEO BRONCHOSCOPY WITH ENDOBRONCHIAL ULTRASOUND N/A 09/30/2019   Procedure: VIDEO BRONCHOSCOPY WITH ENDOBRONCHIAL ULTRASOUND;  Surgeon: Collene Gobble, MD;  Location: [facility];  Service: Thoracic;  Laterality: N/A;  . WISDOM TOOTH EXTRACTION  1976-77    REVIEW OF SYSTEMS:  Constitutional: positive for anorexia, fatigue, fevers and weight loss Eyes: negative Ears, nose, mouth, throat, and face: negative Respiratory: positive for cough and dyspnea on exertion Cardiovascular: negative Gastrointestinal: positive for nausea Genitourinary:negative Integument/breast: negative Hematologic/lymphatic: negative Musculoskeletal:negative Neurological: negative Behavioral/Psych: negative Endocrine: negative Allergic/Immunologic: negative   PHYSICAL EXAMINATION: General appearance: alert, cooperative, fatigued and no distress Head: Normocephalic, without obvious abnormality, atraumatic Neck: no adenopathy, no JVD, supple, symmetrical, trachea midline and thyroid not enlarged, symmetric, no tenderness/mass/nodules Lymph nodes: Cervical, supraclavicular, and axillary nodes normal. Resp: clear to auscultation bilaterally Back: symmetric, no curvature. ROM normal. No CVA tenderness. Cardio: regular rate and rhythm, S1, S2 normal, no murmur, click, rub or gallop GI: soft, non-tender; bowel sounds normal; no masses,  no organomegaly Extremities: extremities normal, atraumatic, no cyanosis or edema Neurologic: Alert and oriented X 3, normal strength and tone. Normal symmetric reflexes. Normal coordination and gait  ECOG PERFORMANCE STATUS: 1 - Symptomatic but completely ambulatory  Blood pressure 117/78, pulse (!) 128, temperature (!) 101 F (38.3 C), temperature source Tympanic, resp. rate 18, height 6' 2"  (1.88 m), weight 189 lb  6.4 oz (85.9 kg), SpO2 95 %.  LABORATORY DATA: Lab Results  Component Value Date   WBC 15.7 (H) 06/01/2020   HGB 10.1 (L) 06/01/2020   HCT 31.4 (L) 06/01/2020   MCV 102.3 (H) 06/01/2020   PLT 145 (L) 06/01/2020      Chemistry      Component Value Date/Time   NA 139 06/08/2020 1102   K 4.5 06/08/2020 1102   CL 105 06/08/2020 1102   CO2 22 06/08/2020 1102   BUN 20 06/08/2020 1102   CREATININE 1.30 (H) 06/08/2020 1102   CREATININE 1.13 10/16/2013 1118      Component Value Date/Time   CALCIUM 9.6 06/08/2020 1102   ALKPHOS 93 06/08/2020 1102   AST 16 06/08/2020 1102   ALT 13 06/08/2020 1102   BILITOT 0.8 06/08/2020 1102       RADIOGRAPHIC STUDIES: CT Chest W Contrast  Result Date: 05/10/2020 CLINICAL DATA:  Primary Cancer Type: Lung Imaging Indication: Assess response to therapy Interval therapy since last imaging? Yes Initial Cancer Diagnosis Date: 06/30/2021l Established by: Biopsy-proven Detailed Pathology: Stage IV non-small cell lung cancer, adenocarcinoma. Primary Tumor location:  Right upper lobe.  Left lower lobe nodule. Surgeries: No. Chemotherapy: Yes; Ongoing? Yes; Most recent administration: 04/20/2020 Immunotherapy?  Yes; Type: Keytruda; ongoing? Yes Radiation therapy? Yes; Date Range: 11/03/2019 - 11/23/2019; Target: Right upper lung.  EXAM: CT CHEST, ABDOMEN, AND PELVIS WITH CONTRAST TECHNIQUE: Multidetector CT imaging of the chest, abdomen and pelvis was performed following the standard protocol during bolus administration of intravenous contrast. CONTRAST:  150m OMNIPAQUE IOHEXOL 300 MG/ML SOLN, additional oral enteric contrast COMPARISON:  Most recent CT chest, abdomen and pelvis 03/08/2020. 10/26/2019 PET-CT. FINDINGS: CT CHEST FINDINGS Cardiovascular: No significant vascular findings. Normal heart size. No pericardial effusion. Mediastinum/Nodes: Unchanged post treatment appearance of soft tissue about the right hilum (series 2, image 29). No discretely enlarged  mediastinal, hilar, or axillary lymph nodes. Thyroid gland, trachea, and esophagus demonstrate no significant findings. Lungs/Pleura: Moderate right pleural effusion, increased compared to prior examination. Significant interval increase in now very dense, masslike consolidation of the posterior right lower lobe (series 6, image 94). No significant change in suprahilar and paramedian consolidation and fibrosis about a mass of the medial right upper lobe, axial component measuring approximately 3.9 x 2.3 cm (series 6, image 63). Slight interval increase in minimal radiation fibrosis of the medial left upper lobe (series 6, image 69). There are numerous small bilateral pulmonary nodules, which are slightly increased in size compared to prior examination, an index nodule of the superior segment left lower lobe measuring 7 mm, previously 5 mm (series 6, image 81), and index nodule of the central right middle lobe measuring 6 mm, previously 4 mm (series 6, image 96). The largest nodule in the peripheral left lower lobe measures 1.1 x 0.9 cm, previously 7 mm (series 6, image 116). Musculoskeletal: No chest wall mass or suspicious bone lesions identified. CT ABDOMEN PELVIS FINDINGS Hepatobiliary: No solid liver abnormality is seen. No gallstones, gallbladder wall thickening, or biliary dilatation. Pancreas: Unremarkable. No pancreatic ductal dilatation or surrounding inflammatory changes. Spleen: Normal in size without significant abnormality. Adrenals/Urinary Tract: Adrenal glands are unremarkable. Tiny nonobstructive calculus of the superior pole of the right kidney (series 2, image 76) and inferior pole of the left kidney (series 4, image 94). Redemonstrated 1.2 cm calculus within the posterior right aspect of the bladder. Stomach/Bowel: Stomach is within normal limits. Diverticulum of the transverse portion of the duodenum. Appendix appears normal. No evidence of bowel wall thickening, distention, or inflammatory  changes. Severe sigmoid diverticulosis. Vascular/Lymphatic: No significant vascular findings are present. No enlarged abdominal or pelvic lymph nodes. Reproductive: No mass or other abnormality. Other: Fat containing left inguinal hernia. No abdominopelvic ascites. Musculoskeletal: No acute or significant osseous findings. IMPRESSION: 1. There are numerous small bilateral pulmonary nodules, slightly increased in size compared to prior examination and consistent with worsened pulmonary metastatic disease. 2. Significant interval increase in now very dense, masslike consolidation of the posterior right lower lobe. Generally favor developing radiation fibrosis given appearance. Attention on follow-up. 3. Unchanged post treatment appearance of paramedian right upper lobe mass and the right hilum. 4. No discretely enlarged mediastinal or hilar lymph nodes. 5. No evidence of distant metastatic disease within the abdomen or pelvis. 6. Nonobstructive bilateral nephrolithiasis. Redemonstrated 1.2 cm calculus within the posterior right aspect of the bladder. No hydronephrosis. Electronically Signed   By: AEddie CandleM.D.   On: 05/10/2020 11:54   CT Abdomen Pelvis W Contrast  Result Date: 05/10/2020 CLINICAL DATA:  Primary Cancer Type: Lung Imaging Indication: Assess response to therapy Interval therapy since last imaging? Yes Initial Cancer Diagnosis Date: 06/30/2021l Established by: Biopsy-proven Detailed Pathology: Stage IV non-small cell lung cancer, adenocarcinoma. Primary Tumor location:  Right upper lobe.  Left lower lobe nodule. Surgeries: No. Chemotherapy: Yes; Ongoing? Yes; Most recent  administration: 04/20/2020 Immunotherapy?  Yes; Type: Keytruda; ongoing? Yes Radiation therapy? Yes; Date Range: 11/03/2019 - 11/23/2019; Target: Right upper lung. EXAM: CT CHEST, ABDOMEN, AND PELVIS WITH CONTRAST TECHNIQUE: Multidetector CT imaging of the chest, abdomen and pelvis was performed following the standard protocol  during bolus administration of intravenous contrast. CONTRAST:  139m OMNIPAQUE IOHEXOL 300 MG/ML SOLN, additional oral enteric contrast COMPARISON:  Most recent CT chest, abdomen and pelvis 03/08/2020. 10/26/2019 PET-CT. FINDINGS: CT CHEST FINDINGS Cardiovascular: No significant vascular findings. Normal heart size. No pericardial effusion. Mediastinum/Nodes: Unchanged post treatment appearance of soft tissue about the right hilum (series 2, image 29). No discretely enlarged mediastinal, hilar, or axillary lymph nodes. Thyroid gland, trachea, and esophagus demonstrate no significant findings. Lungs/Pleura: Moderate right pleural effusion, increased compared to prior examination. Significant interval increase in now very dense, masslike consolidation of the posterior right lower lobe (series 6, image 94). No significant change in suprahilar and paramedian consolidation and fibrosis about a mass of the medial right upper lobe, axial component measuring approximately 3.9 x 2.3 cm (series 6, image 63). Slight interval increase in minimal radiation fibrosis of the medial left upper lobe (series 6, image 69). There are numerous small bilateral pulmonary nodules, which are slightly increased in size compared to prior examination, an index nodule of the superior segment left lower lobe measuring 7 mm, previously 5 mm (series 6, image 81), and index nodule of the central right middle lobe measuring 6 mm, previously 4 mm (series 6, image 96). The largest nodule in the peripheral left lower lobe measures 1.1 x 0.9 cm, previously 7 mm (series 6, image 116). Musculoskeletal: No chest wall mass or suspicious bone lesions identified. CT ABDOMEN PELVIS FINDINGS Hepatobiliary: No solid liver abnormality is seen. No gallstones, gallbladder wall thickening, or biliary dilatation. Pancreas: Unremarkable. No pancreatic ductal dilatation or surrounding inflammatory changes. Spleen: Normal in size without significant abnormality.  Adrenals/Urinary Tract: Adrenal glands are unremarkable. Tiny nonobstructive calculus of the superior pole of the right kidney (series 2, image 76) and inferior pole of the left kidney (series 4, image 94). Redemonstrated 1.2 cm calculus within the posterior right aspect of the bladder. Stomach/Bowel: Stomach is within normal limits. Diverticulum of the transverse portion of the duodenum. Appendix appears normal. No evidence of bowel wall thickening, distention, or inflammatory changes. Severe sigmoid diverticulosis. Vascular/Lymphatic: No significant vascular findings are present. No enlarged abdominal or pelvic lymph nodes. Reproductive: No mass or other abnormality. Other: Fat containing left inguinal hernia. No abdominopelvic ascites. Musculoskeletal: No acute or significant osseous findings. IMPRESSION: 1. There are numerous small bilateral pulmonary nodules, slightly increased in size compared to prior examination and consistent with worsened pulmonary metastatic disease. 2. Significant interval increase in now very dense, masslike consolidation of the posterior right lower lobe. Generally favor developing radiation fibrosis given appearance. Attention on follow-up. 3. Unchanged post treatment appearance of paramedian right upper lobe mass and the right hilum. 4. No discretely enlarged mediastinal or hilar lymph nodes. 5. No evidence of distant metastatic disease within the abdomen or pelvis. 6. Nonobstructive bilateral nephrolithiasis. Redemonstrated 1.2 cm calculus within the posterior right aspect of the bladder. No hydronephrosis. Electronically Signed   By: AEddie CandleM.D.   On: 05/10/2020 11:54   CUP PACEART REMOTE DEVICE CHECK  Result Date: 05/26/2020 ILR alert report received. Battery status OK. Normal device function. No new symptom, tachy, brady, or pause episodes. One  new two minute AF episode, on OTignallaccording to previous reports. Monthly summary reports and  ROV/PRN Kathy Breach, RN, CCDS,  [employer] Will continue to monitor [facility]   ASSESSMENT AND PLAN: This is a very pleasant 70 years old white male recently diagnosed with a stage IV (T3, N2, M1 a) non-small cell lung cancer, adenocarcinoma presented with large right upper lobe lung mass with extension to the right suprahilar region as well as right hilar and mediastinal lymphadenopathy with obstruction of the right upper lobe and bilateral pulmonary nodules diagnosed in July 2021. The molecular study shows no actionable mutations and PD-L1 expression is negative. He had a short course of palliative radiotherapy to the obstructive lung mass under the care of Dr. Lisbeth Renshaw. The patient underwent systemic chemotherapy with carboplatin for AUC of 5, Alimta 500 mg/M2 and Keytruda 200 mg IV every 3 weeks status post 9 cycles.  Starting from cycle #5 he will be on maintenance treatment with Alimta and Keytruda every 3 weeks.  He has been tolerating this treatment well with no concerning complaints except for fatigue and cough.  This treatment was discontinued secondary to disease progression. The patient is currently undergoing palliative second line systemic chemotherapy with docetaxel 75 mg/M2 and Cyramza 10 mg/KG every 3 weeks with Neulasta support.  Started May 18, 2020.  Status post 1 cycle. He has a rough time the first week after his treatment with increasing fatigue and weakness as well as arthralgia and 2 days of hematuria.  He is feeling a little bit better but continues to have increasing fatigue and weakness as well as low-grade fever and tachycardia. I recommended for the patient to delay the start of cycle number 2 x 1 week and I would consider discontinuing his treatment with Cyramza because of the previous hematuria and current treatment with Eliquis. I will also start the patient empirically on Augmentin 875 mg p.o. twice daily for any underlying infection especially with his low-grade fever and elevated white blood  count. I will see the patient back for follow-up visit next week before resuming his treatment and I will consider him for CT scan of the chest, abdomen pelvis in around 4 weeks for restaging of his disease after cycle #2. The patient and his wife agreed to the current plan. For the thyroid malignancy, he was referred to Dr. Harlow Asa for evaluation and surgical intervention. He was advised to call immediately if he has any other concerning symptoms in the interval. The patient voices understanding of current disease status and treatment options and is in agreement with the current care plan.  All questions were answered. The patient knows to call the clinic with any problems, questions or concerns. We can certainly see the patient much sooner if necessary.  Disclaimer: This note was dictated with voice recognition software. Similar sounding words can inadvertently be transcribed and may not be corrected upon review.

## 2020-06-10 ENCOUNTER — Inpatient Hospital Stay: Payer: Medicare HMO

## 2020-06-12 ENCOUNTER — Encounter: Payer: Self-pay | Admitting: Internal Medicine

## 2020-06-14 ENCOUNTER — Telehealth: Payer: Self-pay | Admitting: Internal Medicine

## 2020-06-14 NOTE — Telephone Encounter (Signed)
Scheduled appts per los. Called and spoke with patient. Explained reason for decoupling appts this week. Confirmed all appts

## 2020-06-15 ENCOUNTER — Inpatient Hospital Stay: Payer: Medicare HMO

## 2020-06-16 ENCOUNTER — Inpatient Hospital Stay: Payer: Medicare HMO | Admitting: Internal Medicine

## 2020-06-16 ENCOUNTER — Inpatient Hospital Stay: Payer: Medicare HMO

## 2020-06-16 ENCOUNTER — Other Ambulatory Visit: Payer: Self-pay

## 2020-06-16 ENCOUNTER — Encounter: Payer: Self-pay | Admitting: Internal Medicine

## 2020-06-16 VITALS — BP 127/80 | HR 118 | Temp 99.1°F | Resp 17 | Ht 74.0 in | Wt 184.5 lb

## 2020-06-16 DIAGNOSIS — Z7984 Long term (current) use of oral hypoglycemic drugs: Secondary | ICD-10-CM | POA: Diagnosis not present

## 2020-06-16 DIAGNOSIS — Z7901 Long term (current) use of anticoagulants: Secondary | ICD-10-CM | POA: Diagnosis not present

## 2020-06-16 DIAGNOSIS — I10 Essential (primary) hypertension: Secondary | ICD-10-CM

## 2020-06-16 DIAGNOSIS — Z5111 Encounter for antineoplastic chemotherapy: Secondary | ICD-10-CM

## 2020-06-16 DIAGNOSIS — Z5189 Encounter for other specified aftercare: Secondary | ICD-10-CM | POA: Diagnosis not present

## 2020-06-16 DIAGNOSIS — E119 Type 2 diabetes mellitus without complications: Secondary | ICD-10-CM | POA: Diagnosis not present

## 2020-06-16 DIAGNOSIS — C3411 Malignant neoplasm of upper lobe, right bronchus or lung: Secondary | ICD-10-CM

## 2020-06-16 DIAGNOSIS — Z8673 Personal history of transient ischemic attack (TIA), and cerebral infarction without residual deficits: Secondary | ICD-10-CM | POA: Diagnosis not present

## 2020-06-16 DIAGNOSIS — Z5112 Encounter for antineoplastic immunotherapy: Secondary | ICD-10-CM | POA: Diagnosis not present

## 2020-06-16 DIAGNOSIS — Z79899 Other long term (current) drug therapy: Secondary | ICD-10-CM | POA: Diagnosis not present

## 2020-06-16 DIAGNOSIS — Z923 Personal history of irradiation: Secondary | ICD-10-CM | POA: Diagnosis not present

## 2020-06-16 LAB — CBC WITH DIFFERENTIAL (CANCER CENTER ONLY)
Abs Immature Granulocytes: 0.12 10*3/uL — ABNORMAL HIGH (ref 0.00–0.07)
Basophils Absolute: 0.1 10*3/uL (ref 0.0–0.1)
Basophils Relative: 1 %
Eosinophils Absolute: 0.4 10*3/uL (ref 0.0–0.5)
Eosinophils Relative: 3 %
HCT: 33 % — ABNORMAL LOW (ref 39.0–52.0)
Hemoglobin: 10.5 g/dL — ABNORMAL LOW (ref 13.0–17.0)
Immature Granulocytes: 1 %
Lymphocytes Relative: 3 %
Lymphs Abs: 0.4 10*3/uL — ABNORMAL LOW (ref 0.7–4.0)
MCH: 32.9 pg (ref 26.0–34.0)
MCHC: 31.8 g/dL (ref 30.0–36.0)
MCV: 103.4 fL — ABNORMAL HIGH (ref 80.0–100.0)
Monocytes Absolute: 0.7 10*3/uL (ref 0.1–1.0)
Monocytes Relative: 6 %
Neutro Abs: 10.9 10*3/uL — ABNORMAL HIGH (ref 1.7–7.7)
Neutrophils Relative %: 86 %
Platelet Count: 296 10*3/uL (ref 150–400)
RBC: 3.19 MIL/uL — ABNORMAL LOW (ref 4.22–5.81)
RDW: 15.1 % (ref 11.5–15.5)
WBC Count: 12.6 10*3/uL — ABNORMAL HIGH (ref 4.0–10.5)
nRBC: 0 % (ref 0.0–0.2)

## 2020-06-16 LAB — TSH: TSH: 1.183 u[IU]/mL (ref 0.320–4.118)

## 2020-06-16 LAB — CMP (CANCER CENTER ONLY)
ALT: 14 U/L (ref 0–44)
AST: 18 U/L (ref 15–41)
Albumin: 3 g/dL — ABNORMAL LOW (ref 3.5–5.0)
Alkaline Phosphatase: 79 U/L (ref 38–126)
Anion gap: 10 (ref 5–15)
BUN: 22 mg/dL (ref 8–23)
CO2: 23 mmol/L (ref 22–32)
Calcium: 10 mg/dL (ref 8.9–10.3)
Chloride: 106 mmol/L (ref 98–111)
Creatinine: 1.24 mg/dL (ref 0.61–1.24)
GFR, Estimated: >60 mL/min (ref >60)
Glucose, Bld: 111 mg/dL — ABNORMAL HIGH (ref 70–99)
Potassium: 4.9 mmol/L (ref 3.5–5.1)
Sodium: 139 mmol/L (ref 135–145)
Total Bilirubin: 0.6 mg/dL (ref 0.3–1.2)
Total Protein: 7.2 g/dL (ref 6.5–8.1)

## 2020-06-16 NOTE — Progress Notes (Signed)
[place] [phone]   [phone]  OFFICE PROGRESS NOTE  Copland, Gay Filler, MD [address] [place] [address]  DIAGNOSIS: Stage IV (T3, N2, M1a) non-small cell lung cancer, adenocarcinoma presented with large right upper lobe lung mass extending to the right suprahilar region in addition to right hilar and mediastinal lymphadenopathy with obstruction of the associated upper lobe bronchus and bilateral subcentimeter pulmonary nodules diagnosed in July 2021.  Biomarker Findings Microsatellite status - MS-Stable Tumor Mutational Burden - 3 Muts/Mb Genomic Findings For a complete list of the genes assayed, please refer to the Appendix. ERBB2 [id number], amplification - equivocal? HGF amplification - equivocal? MTAP loss exons 5-8 MYC amplification - equivocal? CDKN2A/B CDKN2B loss, CDKN2A loss CUL3 Y63f*19 RPTOR amplification TP53 [id number] Disease relevant genes with no reportable alterations: ALK, BRAF, EGFR, KRAS, MET, RET, ROS1  PDL1 Expression: Negative  PRIOR THERAPY:  1) Palliative radiotherapy to the obstructive lung mass under the care of Dr. MoLisbeth RenshawThe past treatment is scheduled for8/23/21. 2) Systemic chemotherapy with carboplatin for AUC of 5, Alimta 500 mg/M2 and Keytruda 200 mg IV every 3 weeks.  First dose November 04, 2019.  Status post 9 cycles.  Starting from cycle #5 he will be on maintenance treatment with Alimta and Keytruda every 3 weeks.  CURRENT THERAPY: Second line systemic chemotherapy with docetaxel 75 mg/M2 and Cyramza 10 mg/KG every weeks with Neulasta support.  First dose May 18, 2020.  Status post 1 cycle.  INTERVAL HISTORY: Micheal Andrepont89.o. male returns to the clinic today for follow-up visit.  The patient continues to complain of fatigue and weakness as well as dry cough.  He is trying to eat as much as he can but he is losing weight.  He denied having any current  chest pain but has the baseline shortness of breath with cough and no hemoptysis.  He denied having any nausea, vomiting, diarrhea or constipation.  He continues to have low-grade fever and he was treated with a course of Augmentin for the last 5 days.  The patient is here today for evaluation before resuming his systemic chemotherapy.  MEDICAL HISTORY: Past Medical History:  Diagnosis Date  . Chicken pox   . DM2 (diabetes mellitus, type 2) ([facility]  . History of kidney stones    passed several   . Hypertension   . Kidney stones    5-6 times  . Measles   . Mumps   . Stroke ([facility]   x 2, last one was 09/18/2019- has some expressive aphasia    ALLERGIES:  is allergic to bee venom.  MEDICATIONS:  Current Outpatient Medications  Medication Sig Dispense Refill  . acetaminophen (TYLENOL) 500 MG tablet Take 500 mg by mouth every 6 (six) hours as needed.     . Marland Kitchenmoxicillin-clavulanate (AUGMENTIN) 875-125 MG tablet Take 1 tablet by mouth 2 (two) times daily. 10 tablet 0  . apixaban (ELIQUIS) 5 MG TABS tablet Take 1 tablet (5 mg total) by mouth 2 (two) times daily. 60 tablet 11  . atorvastatin (LIPITOR) 40 MG tablet Take 1 tablet (40 mg total) by mouth daily at 6 PM. 90 tablet 3  . dexamethasone (DECADRON) 4 MG tablet 2 tablet p.o. twice daily the day before, day of and day after chemotherapy every 3 weeks. 40 tablet 1  . folic acid (FOLVITE) 1 MG tablet Take 1 tablet (1 mg total) by mouth daily. 90 tablet  3  . gabapentin (NEURONTIN) 300 MG capsule Take 1 capsule (300 mg total) by mouth at bedtime. 90 capsule 3  . HYDROcodone-homatropine (HYCODAN) 5-1.5 MG/5ML syrup Take 5 mLs by mouth every 6 (six) hours as needed for cough. 120 mL 0  . metFORMIN (GLUCOPHAGE) 500 MG tablet TAKE TWO TABLETS BY MOUTH TWICE A DAY WITH MEAL(S) 360 tablet 3  . Omega-3 Fatty Acids (FISH OIL) 1200 MG CAPS Take by mouth daily.    . prochlorperazine (COMPAZINE) 10 MG tablet Take 1 tablet (10 mg total) by mouth every 6  (six) hours as needed. 30 tablet 2  . sitaGLIPtin (JANUVIA) 100 MG tablet Take 1 tablet (100 mg total) by mouth daily. 90 tablet 3   No current facility-administered medications for this visit.    SURGICAL HISTORY:  Past Surgical History:  Procedure Laterality Date  . COLONOSCOPY W/ POLYPECTOMY    . VIDEO BRONCHOSCOPY WITH ENDOBRONCHIAL ULTRASOUND N/A 09/30/2019   Procedure: VIDEO BRONCHOSCOPY WITH ENDOBRONCHIAL ULTRASOUND;  Surgeon: Collene Gobble, MD;  Location: [facility];  Service: Thoracic;  Laterality: N/A;  . WISDOM TOOTH EXTRACTION  1976-77    REVIEW OF SYSTEMS:  A comprehensive review of systems was negative except for: Constitutional: positive for anorexia, fatigue and weight loss Respiratory: positive for cough and dyspnea on exertion Musculoskeletal: positive for muscle weakness   PHYSICAL EXAMINATION: General appearance: alert, cooperative, fatigued and no distress Head: Normocephalic, without obvious abnormality, atraumatic Neck: no adenopathy, no JVD, supple, symmetrical, trachea midline and thyroid not enlarged, symmetric, no tenderness/mass/nodules Lymph nodes: Cervical, supraclavicular, and axillary nodes normal. Resp: clear to auscultation bilaterally Back: symmetric, no curvature. ROM normal. No CVA tenderness. Cardio: regular rate and rhythm, S1, S2 normal, no murmur, click, rub or gallop GI: soft, non-tender; bowel sounds normal; no masses,  no organomegaly Extremities: extremities normal, atraumatic, no cyanosis or edema  ECOG PERFORMANCE STATUS: 1 - Symptomatic but completely ambulatory  Blood pressure 127/80, pulse (!) 118, temperature 99.1 F (37.3 C), temperature source Tympanic, resp. rate 17, height 6' 2"  (1.88 m), weight 184 lb 8 oz (83.7 kg), SpO2 95 %.  LABORATORY DATA: Lab Results  Component Value Date   WBC 12.6 (H) 06/16/2020   HGB 10.5 (L) 06/16/2020   HCT 33.0 (L) 06/16/2020   MCV 103.4 (H) 06/16/2020   PLT 296 06/16/2020      Chemistry       Component Value Date/Time   NA 139 06/08/2020 1102   K 4.5 06/08/2020 1102   CL 105 06/08/2020 1102   CO2 22 06/08/2020 1102   BUN 20 06/08/2020 1102   CREATININE 1.30 (H) 06/08/2020 1102   CREATININE 1.13 10/16/2013 1118      Component Value Date/Time   CALCIUM 9.6 06/08/2020 1102   ALKPHOS 93 06/08/2020 1102   AST 16 06/08/2020 1102   ALT 13 06/08/2020 1102   BILITOT 0.8 06/08/2020 1102       RADIOGRAPHIC STUDIES: CUP PACEART REMOTE DEVICE CHECK  Result Date: 05/26/2020 ILR alert report received. Battery status OK. Normal device function. No new symptom, tachy, brady, or pause episodes. One  new two minute AF episode, on [place] according to previous reports. Monthly summary reports and ROV/PRN Kathy Breach, RN, CCDS, [employer] Will continue to monitor [facility]   ASSESSMENT AND PLAN: This is a very pleasant 70 years old white male recently diagnosed with a stage IV (T3, N2, M1 a) non-small cell lung cancer, adenocarcinoma presented with large right upper lobe lung mass  with extension to the right suprahilar region as well as right hilar and mediastinal lymphadenopathy with obstruction of the right upper lobe and bilateral pulmonary nodules diagnosed in July 2021. The molecular study shows no actionable mutations and PD-L1 expression is negative. He had a short course of palliative radiotherapy to the obstructive lung mass under the care of Dr. Lisbeth Renshaw. The patient underwent systemic chemotherapy with carboplatin for AUC of 5, Alimta 500 mg/M2 and Keytruda 200 mg IV every 3 weeks status post 9 cycles.  Starting from cycle #5 he will be on maintenance treatment with Alimta and Keytruda every 3 weeks.  He has been tolerating this treatment well with no concerning complaints except for fatigue and cough.  This treatment was discontinued secondary to disease progression. The patient is currently undergoing palliative second line systemic chemotherapy with docetaxel 75 mg/M2 and Cyramza 10  mg/KG every 3 weeks with Neulasta support.  Started May 18, 2020.  Status post 1 cycle. He has a rough time the first week after his treatment with increasing fatigue and weakness as well as arthralgia and 2 days of hematuria.  His treatment was delayed by 1 week because of fever.  He was treated with Augmentin for 1 week and feeling a little bit better.  He has decrease in the total white blood count as well as low-grade fever. I recommended for the patient to proceed with cycle #2 today as planned but I will reduce the dose of docetaxel to 65 mg/M2. I will see him back for follow-up visit in 3 weeks for evaluation with repeat CT scan of the chest, abdomen pelvis for restaging of his disease. For the thyroid malignancy, he was referred to Dr. Harlow Asa for evaluation and surgical intervention. The patient was also encouraged to increase his oral hydration and snacks in between meals. He was advised to call immediately if he has any other concerning symptoms in the interval. The patient voices understanding of current disease status and treatment options and is in agreement with the current care plan.  All questions were answered. The patient knows to call the clinic with any problems, questions or concerns. We can certainly see the patient much sooner if necessary.  Disclaimer: This note was dictated with voice recognition software. Similar sounding words can inadvertently be transcribed and may not be corrected upon review.

## 2020-06-17 ENCOUNTER — Other Ambulatory Visit: Payer: Self-pay

## 2020-06-17 ENCOUNTER — Inpatient Hospital Stay: Payer: Medicare HMO

## 2020-06-17 VITALS — BP 110/87 | HR 100 | Temp 97.5°F | Resp 18 | Wt 183.8 lb

## 2020-06-17 DIAGNOSIS — Z8673 Personal history of transient ischemic attack (TIA), and cerebral infarction without residual deficits: Secondary | ICD-10-CM | POA: Diagnosis not present

## 2020-06-17 DIAGNOSIS — Z923 Personal history of irradiation: Secondary | ICD-10-CM | POA: Diagnosis not present

## 2020-06-17 DIAGNOSIS — Z7901 Long term (current) use of anticoagulants: Secondary | ICD-10-CM | POA: Diagnosis not present

## 2020-06-17 DIAGNOSIS — C3411 Malignant neoplasm of upper lobe, right bronchus or lung: Secondary | ICD-10-CM

## 2020-06-17 DIAGNOSIS — Z5112 Encounter for antineoplastic immunotherapy: Secondary | ICD-10-CM | POA: Diagnosis not present

## 2020-06-17 DIAGNOSIS — Z7984 Long term (current) use of oral hypoglycemic drugs: Secondary | ICD-10-CM | POA: Diagnosis not present

## 2020-06-17 DIAGNOSIS — Z5189 Encounter for other specified aftercare: Secondary | ICD-10-CM | POA: Diagnosis not present

## 2020-06-17 DIAGNOSIS — I10 Essential (primary) hypertension: Secondary | ICD-10-CM | POA: Diagnosis not present

## 2020-06-17 DIAGNOSIS — E119 Type 2 diabetes mellitus without complications: Secondary | ICD-10-CM | POA: Diagnosis not present

## 2020-06-17 DIAGNOSIS — Z79899 Other long term (current) drug therapy: Secondary | ICD-10-CM | POA: Diagnosis not present

## 2020-06-17 MED ORDER — ACETAMINOPHEN 325 MG PO TABS
ORAL_TABLET | ORAL | Status: AC
  Filled 2020-06-17: qty 2

## 2020-06-17 MED ORDER — DIPHENHYDRAMINE HCL 50 MG/ML IJ SOLN
50.0000 mg | Freq: Once | INTRAMUSCULAR | Status: AC
  Administered 2020-06-17: 50 mg via INTRAVENOUS

## 2020-06-17 MED ORDER — SODIUM CHLORIDE 0.9 % IV SOLN
65.0000 mg/m2 | Freq: Once | INTRAVENOUS | Status: AC
  Administered 2020-06-17: 140 mg via INTRAVENOUS
  Filled 2020-06-17: qty 14

## 2020-06-17 MED ORDER — SODIUM CHLORIDE 0.9 % IV SOLN
Freq: Once | INTRAVENOUS | Status: AC
  Filled 2020-06-17: qty 250

## 2020-06-17 MED ORDER — DIPHENHYDRAMINE HCL 50 MG/ML IJ SOLN
INTRAMUSCULAR | Status: AC
  Filled 2020-06-17: qty 1

## 2020-06-17 MED ORDER — ACETAMINOPHEN 325 MG PO TABS
650.0000 mg | ORAL_TABLET | Freq: Once | ORAL | Status: AC
  Administered 2020-06-17: 650 mg via ORAL

## 2020-06-17 MED ORDER — SODIUM CHLORIDE 0.9 % IV SOLN
10.0000 mg | Freq: Once | INTRAVENOUS | Status: AC
  Administered 2020-06-17: 10 mg via INTRAVENOUS
  Filled 2020-06-17: qty 10
  Filled 2020-06-17: qty 1

## 2020-06-17 NOTE — Patient Instructions (Signed)
[facility] Discharge Instructions for Patients Receiving Chemotherapy  Today you received the following chemotherapy agents: docetaxel.  To help prevent nausea and vomiting after your treatment, we encourage you to take your nausea medication as directed.   If you develop nausea and vomiting that is not controlled by your nausea medication, call the clinic.   BELOW ARE SYMPTOMS THAT SHOULD BE REPORTED IMMEDIATELY:  *FEVER GREATER THAN 100.5 F  *CHILLS WITH OR WITHOUT FEVER  NAUSEA AND VOMITING THAT IS NOT CONTROLLED WITH YOUR NAUSEA MEDICATION  *UNUSUAL SHORTNESS OF BREATH  *UNUSUAL BRUISING OR BLEEDING  TENDERNESS IN MOUTH AND THROAT WITH OR WITHOUT PRESENCE OF ULCERS  *URINARY PROBLEMS  *BOWEL PROBLEMS  UNUSUAL RASH Items with * indicate a potential emergency and should be followed up as soon as possible.  Feel free to call the clinic should you have any questions or concerns. The clinic phone number is [phone].  Please show the CHEMO ALERT CARD at check-in to the Emergency Department and triage nurse.   

## 2020-06-20 ENCOUNTER — Inpatient Hospital Stay: Payer: Medicare HMO

## 2020-06-20 ENCOUNTER — Other Ambulatory Visit: Payer: Self-pay

## 2020-06-20 VITALS — BP 142/99 | HR 88

## 2020-06-20 DIAGNOSIS — Z8673 Personal history of transient ischemic attack (TIA), and cerebral infarction without residual deficits: Secondary | ICD-10-CM | POA: Diagnosis not present

## 2020-06-20 DIAGNOSIS — I10 Essential (primary) hypertension: Secondary | ICD-10-CM | POA: Diagnosis not present

## 2020-06-20 DIAGNOSIS — Z79899 Other long term (current) drug therapy: Secondary | ICD-10-CM | POA: Diagnosis not present

## 2020-06-20 DIAGNOSIS — Z5112 Encounter for antineoplastic immunotherapy: Secondary | ICD-10-CM | POA: Diagnosis not present

## 2020-06-20 DIAGNOSIS — C3411 Malignant neoplasm of upper lobe, right bronchus or lung: Secondary | ICD-10-CM

## 2020-06-20 DIAGNOSIS — E119 Type 2 diabetes mellitus without complications: Secondary | ICD-10-CM | POA: Diagnosis not present

## 2020-06-20 DIAGNOSIS — Z923 Personal history of irradiation: Secondary | ICD-10-CM | POA: Diagnosis not present

## 2020-06-20 DIAGNOSIS — Z5189 Encounter for other specified aftercare: Secondary | ICD-10-CM | POA: Diagnosis not present

## 2020-06-20 DIAGNOSIS — Z7901 Long term (current) use of anticoagulants: Secondary | ICD-10-CM | POA: Diagnosis not present

## 2020-06-20 DIAGNOSIS — Z7984 Long term (current) use of oral hypoglycemic drugs: Secondary | ICD-10-CM | POA: Diagnosis not present

## 2020-06-20 MED ORDER — PEGFILGRASTIM-CBQV 6 MG/0.6ML ~~LOC~~ SOSY
PREFILLED_SYRINGE | SUBCUTANEOUS | Status: AC
  Filled 2020-06-20: qty 0.6

## 2020-06-20 MED ORDER — PEGFILGRASTIM-CBQV 6 MG/0.6ML ~~LOC~~ SOSY
6.0000 mg | PREFILLED_SYRINGE | Freq: Once | SUBCUTANEOUS | Status: AC
  Administered 2020-06-20: 6 mg via SUBCUTANEOUS

## 2020-06-20 NOTE — Patient Instructions (Signed)
Pegfilgrastim injection What is this medicine? PEGFILGRASTIM (PEG fil gra stim) is a long-acting granulocyte colony-stimulating factor that stimulates the growth of neutrophils, a type of white blood cell important in the body's fight against infection. It is used to reduce the incidence of fever and infection in patients with certain types of cancer who are receiving chemotherapy that affects the bone marrow, and to increase survival after being exposed to high doses of radiation. This medicine may be used for other purposes; ask your health care provider or pharmacist if you have questions. COMMON BRAND NAME(S): Fulphila, Neulasta, Nyvepria, UDENYCA, Ziextenzo What should I tell my health care provider before I take this medicine? They need to know if you have any of these conditions:  kidney disease  latex allergy  ongoing radiation therapy  sickle cell disease  skin reactions to acrylic adhesives (On-Body Injector only)  an unusual or allergic reaction to pegfilgrastim, filgrastim, other medicines, foods, dyes, or preservatives  pregnant or trying to get pregnant  breast-feeding How should I use this medicine? This medicine is for injection under the skin. If you get this medicine at home, you will be taught how to prepare and give the pre-filled syringe or how to use the On-body Injector. Refer to the patient Instructions for Use for detailed instructions. Use exactly as directed. Tell your healthcare provider immediately if you suspect that the On-body Injector may not have performed as intended or if you suspect the use of the On-body Injector resulted in a missed or partial dose. It is important that you put your used needles and syringes in a special sharps container. Do not put them in a trash can. If you do not have a sharps container, call your pharmacist or healthcare provider to get one. Talk to your pediatrician regarding the use of this medicine in children. While this drug  may be prescribed for selected conditions, precautions do apply. Overdosage: If you think you have taken too much of this medicine contact a poison control center or emergency room at once. NOTE: This medicine is only for you. Do not share this medicine with others. What if I miss a dose? It is important not to miss your dose. Call your doctor or health care professional if you miss your dose. If you miss a dose due to an On-body Injector failure or leakage, a new dose should be administered as soon as possible using a single prefilled syringe for manual use. What may interact with this medicine? Interactions have not been studied. This list may not describe all possible interactions. Give your health care provider a list of all the medicines, herbs, non-prescription drugs, or dietary supplements you use. Also tell them if you smoke, drink alcohol, or use illegal drugs. Some items may interact with your medicine. What should I watch for while using this medicine? Your condition will be monitored carefully while you are receiving this medicine. You may need blood work done while you are taking this medicine. Talk to your health care provider about your risk of cancer. You may be more at risk for certain types of cancer if you take this medicine. If you are going to need a MRI, CT scan, or other procedure, tell your doctor that you are using this medicine (On-Body Injector only). What side effects may I notice from receiving this medicine? Side effects that you should report to your doctor or health care professional as soon as possible:  allergic reactions (skin rash, itching or hives, swelling of   the face, lips, or tongue)  back pain  dizziness  fever  pain, redness, or irritation at site where injected  pinpoint red spots on the skin  red or dark-brown urine  shortness of breath or breathing problems  stomach or side pain, or pain at the shoulder  swelling  tiredness  trouble  passing urine or change in the amount of urine  unusual bruising or bleeding Side effects that usually do not require medical attention (report to your doctor or health care professional if they continue or are bothersome):  bone pain  muscle pain This list may not describe all possible side effects. Call your doctor for medical advice about side effects. You may report side effects to FDA at 1-800-FDA-1088. Where should I keep my medicine? Keep out of the reach of children. If you are using this medicine at home, you will be instructed on how to store it. Throw away any unused medicine after the expiration date on the label. NOTE: This sheet is a summary. It may not cover all possible information. If you have questions about this medicine, talk to your doctor, pharmacist, or health care provider.  2021 Elsevier/Gold Standard (2019-04-10 13:20:51)  

## 2020-06-22 ENCOUNTER — Other Ambulatory Visit: Payer: Medicare HMO

## 2020-06-22 ENCOUNTER — Other Ambulatory Visit: Payer: Self-pay

## 2020-06-22 ENCOUNTER — Inpatient Hospital Stay: Payer: Medicare HMO

## 2020-06-22 DIAGNOSIS — Z79899 Other long term (current) drug therapy: Secondary | ICD-10-CM | POA: Diagnosis not present

## 2020-06-22 DIAGNOSIS — E119 Type 2 diabetes mellitus without complications: Secondary | ICD-10-CM | POA: Diagnosis not present

## 2020-06-22 DIAGNOSIS — Z7984 Long term (current) use of oral hypoglycemic drugs: Secondary | ICD-10-CM | POA: Diagnosis not present

## 2020-06-22 DIAGNOSIS — I10 Essential (primary) hypertension: Secondary | ICD-10-CM | POA: Diagnosis not present

## 2020-06-22 DIAGNOSIS — Z923 Personal history of irradiation: Secondary | ICD-10-CM | POA: Diagnosis not present

## 2020-06-22 DIAGNOSIS — C3411 Malignant neoplasm of upper lobe, right bronchus or lung: Secondary | ICD-10-CM

## 2020-06-22 DIAGNOSIS — Z5189 Encounter for other specified aftercare: Secondary | ICD-10-CM | POA: Diagnosis not present

## 2020-06-22 DIAGNOSIS — Z8673 Personal history of transient ischemic attack (TIA), and cerebral infarction without residual deficits: Secondary | ICD-10-CM | POA: Diagnosis not present

## 2020-06-22 DIAGNOSIS — Z5112 Encounter for antineoplastic immunotherapy: Secondary | ICD-10-CM | POA: Diagnosis not present

## 2020-06-22 DIAGNOSIS — Z7901 Long term (current) use of anticoagulants: Secondary | ICD-10-CM | POA: Diagnosis not present

## 2020-06-22 LAB — CMP (CANCER CENTER ONLY)
ALT: 29 U/L (ref 0–44)
AST: 23 U/L (ref 15–41)
Albumin: 3.1 g/dL — ABNORMAL LOW (ref 3.5–5.0)
Alkaline Phosphatase: 83 U/L (ref 38–126)
Anion gap: 11 (ref 5–15)
BUN: 26 mg/dL — ABNORMAL HIGH (ref 8–23)
CO2: 22 mmol/L (ref 22–32)
Calcium: 9 mg/dL (ref 8.9–10.3)
Chloride: 109 mmol/L (ref 98–111)
Creatinine: 1.13 mg/dL (ref 0.61–1.24)
GFR, Estimated: >60 mL/min (ref >60)
Glucose, Bld: 103 mg/dL — ABNORMAL HIGH (ref 70–99)
Potassium: 4.5 mmol/L (ref 3.5–5.1)
Sodium: 142 mmol/L (ref 135–145)
Total Bilirubin: 1.3 mg/dL — ABNORMAL HIGH (ref 0.3–1.2)
Total Protein: 6.3 g/dL — ABNORMAL LOW (ref 6.5–8.1)

## 2020-06-22 LAB — CBC WITH DIFFERENTIAL (CANCER CENTER ONLY)
Abs Immature Granulocytes: 1.8 10*3/uL — ABNORMAL HIGH (ref 0.00–0.07)
Basophils Absolute: 0.1 10*3/uL (ref 0.0–0.1)
Basophils Relative: 1 %
Eosinophils Absolute: 0.1 10*3/uL (ref 0.0–0.5)
Eosinophils Relative: 1 %
HCT: 30.6 % — ABNORMAL LOW (ref 39.0–52.0)
Hemoglobin: 9.8 g/dL — ABNORMAL LOW (ref 13.0–17.0)
Immature Granulocytes: 13 %
Lymphocytes Relative: 3 %
Lymphs Abs: 0.4 10*3/uL — ABNORMAL LOW (ref 0.7–4.0)
MCH: 32.5 pg (ref 26.0–34.0)
MCHC: 32 g/dL (ref 30.0–36.0)
MCV: 101.3 fL — ABNORMAL HIGH (ref 80.0–100.0)
Monocytes Absolute: 0.1 10*3/uL (ref 0.1–1.0)
Monocytes Relative: 1 %
Neutro Abs: 11.8 10*3/uL — ABNORMAL HIGH (ref 1.7–7.7)
Neutrophils Relative %: 81 %
Platelet Count: 211 10*3/uL (ref 150–400)
RBC: 3.02 MIL/uL — ABNORMAL LOW (ref 4.22–5.81)
RDW: 15 % (ref 11.5–15.5)
WBC Count: 14.3 10*3/uL — ABNORMAL HIGH (ref 4.0–10.5)
nRBC: 0 % (ref 0.0–0.2)

## 2020-06-22 LAB — LACTATE DEHYDROGENASE: LDH: 199 U/L — ABNORMAL HIGH (ref 98–192)

## 2020-06-26 LAB — CUP PACEART REMOTE DEVICE CHECK
Date Time Interrogation Session: 20220325121314
Implantable Pulse Generator Implant Date: 20210907

## 2020-06-27 ENCOUNTER — Ambulatory Visit (INDEPENDENT_AMBULATORY_CARE_PROVIDER_SITE_OTHER): Payer: Medicare HMO

## 2020-06-27 DIAGNOSIS — I639 Cerebral infarction, unspecified: Secondary | ICD-10-CM

## 2020-06-29 ENCOUNTER — Ambulatory Visit: Payer: Medicare HMO | Admitting: Physician Assistant

## 2020-06-29 ENCOUNTER — Inpatient Hospital Stay: Payer: Medicare HMO | Admitting: Nutrition

## 2020-06-29 ENCOUNTER — Telehealth: Payer: Self-pay

## 2020-06-29 ENCOUNTER — Ambulatory Visit: Payer: Medicare HMO

## 2020-06-29 ENCOUNTER — Other Ambulatory Visit: Payer: Medicare HMO

## 2020-06-29 ENCOUNTER — Other Ambulatory Visit: Payer: Self-pay

## 2020-06-29 ENCOUNTER — Inpatient Hospital Stay: Payer: Medicare HMO

## 2020-06-29 DIAGNOSIS — C3411 Malignant neoplasm of upper lobe, right bronchus or lung: Secondary | ICD-10-CM | POA: Diagnosis not present

## 2020-06-29 DIAGNOSIS — Z923 Personal history of irradiation: Secondary | ICD-10-CM | POA: Diagnosis not present

## 2020-06-29 DIAGNOSIS — I10 Essential (primary) hypertension: Secondary | ICD-10-CM | POA: Diagnosis not present

## 2020-06-29 DIAGNOSIS — Z7984 Long term (current) use of oral hypoglycemic drugs: Secondary | ICD-10-CM | POA: Diagnosis not present

## 2020-06-29 DIAGNOSIS — Z5189 Encounter for other specified aftercare: Secondary | ICD-10-CM | POA: Diagnosis not present

## 2020-06-29 DIAGNOSIS — Z8673 Personal history of transient ischemic attack (TIA), and cerebral infarction without residual deficits: Secondary | ICD-10-CM | POA: Diagnosis not present

## 2020-06-29 DIAGNOSIS — Z7901 Long term (current) use of anticoagulants: Secondary | ICD-10-CM | POA: Diagnosis not present

## 2020-06-29 DIAGNOSIS — E119 Type 2 diabetes mellitus without complications: Secondary | ICD-10-CM | POA: Diagnosis not present

## 2020-06-29 DIAGNOSIS — Z5112 Encounter for antineoplastic immunotherapy: Secondary | ICD-10-CM | POA: Diagnosis not present

## 2020-06-29 DIAGNOSIS — Z79899 Other long term (current) drug therapy: Secondary | ICD-10-CM | POA: Diagnosis not present

## 2020-06-29 LAB — CMP (CANCER CENTER ONLY)
ALT: 24 U/L (ref 0–44)
AST: 22 U/L (ref 15–41)
Albumin: 2.7 g/dL — ABNORMAL LOW (ref 3.5–5.0)
Alkaline Phosphatase: 130 U/L — ABNORMAL HIGH (ref 38–126)
Anion gap: 13 (ref 5–15)
BUN: 22 mg/dL (ref 8–23)
CO2: 22 mmol/L (ref 22–32)
Calcium: 8.7 mg/dL — ABNORMAL LOW (ref 8.9–10.3)
Chloride: 102 mmol/L (ref 98–111)
Creatinine: 1.29 mg/dL — ABNORMAL HIGH (ref 0.61–1.24)
GFR, Estimated: >60 mL/min (ref >60)
Glucose, Bld: 109 mg/dL — ABNORMAL HIGH (ref 70–99)
Potassium: 4.1 mmol/L (ref 3.5–5.1)
Sodium: 137 mmol/L (ref 135–145)
Total Bilirubin: 1 mg/dL (ref 0.3–1.2)
Total Protein: 6.4 g/dL — ABNORMAL LOW (ref 6.5–8.1)

## 2020-06-29 LAB — CBC WITH DIFFERENTIAL (CANCER CENTER ONLY)
Abs Immature Granulocytes: 0.61 10*3/uL — ABNORMAL HIGH (ref 0.00–0.07)
Basophils Absolute: 0.1 10*3/uL (ref 0.0–0.1)
Basophils Relative: 1 %
Eosinophils Absolute: 0.1 10*3/uL (ref 0.0–0.5)
Eosinophils Relative: 0 %
HCT: 30.9 % — ABNORMAL LOW (ref 39.0–52.0)
Hemoglobin: 9.9 g/dL — ABNORMAL LOW (ref 13.0–17.0)
Immature Granulocytes: 3 %
Lymphocytes Relative: 2 %
Lymphs Abs: 0.5 10*3/uL — ABNORMAL LOW (ref 0.7–4.0)
MCH: 31.9 pg (ref 26.0–34.0)
MCHC: 32 g/dL (ref 30.0–36.0)
MCV: 99.7 fL (ref 80.0–100.0)
Monocytes Absolute: 0.6 10*3/uL (ref 0.1–1.0)
Monocytes Relative: 3 %
Neutro Abs: 18.9 10*3/uL — ABNORMAL HIGH (ref 1.7–7.7)
Neutrophils Relative %: 91 %
Platelet Count: 158 10*3/uL (ref 150–400)
RBC: 3.1 MIL/uL — ABNORMAL LOW (ref 4.22–5.81)
RDW: 15.3 % (ref 11.5–15.5)
WBC Count: 20.8 10*3/uL — ABNORMAL HIGH (ref 4.0–10.5)
nRBC: 0 % (ref 0.0–0.2)

## 2020-06-29 LAB — LACTATE DEHYDROGENASE: LDH: 258 U/L — ABNORMAL HIGH (ref 98–192)

## 2020-06-29 NOTE — Telephone Encounter (Signed)
[facility] ILR II alert for AF event logged 4 minutes; ECG suggest ST w/ ectopy. History of A Fib and on apixaban. Presenting rhythm strip suggest ST w/ ectopy d/t regular R-R and small p waves. Routing to Dr. Curt Bears for presenting.   Patient reports he has felt well and has no complaints. Reports he did fall yesterday, he was in a dark room not able to see, fell onto his bottom. Denies any injury, specifically asked in regard to head and patient answered "No". Reports compliance with all medications including coumadin 5 mg BID.   Advised patient I will forward to Dr. Curt Bears and advised patient to call with any changes. Verbalized understanding.

## 2020-06-29 NOTE — Telephone Encounter (Signed)
On eliquis, continue to monitor.

## 2020-06-30 NOTE — Telephone Encounter (Signed)
    Pt is calling back to follow up  

## 2020-06-30 NOTE — Telephone Encounter (Signed)
Attempted to contact patient to return his call. Will also send a message in MY Chart. Dr. Curt Bears reports that patient is on Eliquis and to continue to monitor.

## 2020-07-04 ENCOUNTER — Other Ambulatory Visit: Payer: Self-pay | Admitting: Medical Oncology

## 2020-07-04 DIAGNOSIS — C3490 Malignant neoplasm of unspecified part of unspecified bronchus or lung: Secondary | ICD-10-CM

## 2020-07-05 ENCOUNTER — Encounter (HOSPITAL_COMMUNITY): Payer: Self-pay

## 2020-07-05 ENCOUNTER — Other Ambulatory Visit: Payer: Self-pay

## 2020-07-05 ENCOUNTER — Ambulatory Visit (HOSPITAL_COMMUNITY)
Admission: RE | Admit: 2020-07-05 | Discharge: 2020-07-05 | Disposition: A | Discharge status: Home / Self Care | Payer: Medicare HMO | Source: Ambulatory Visit | Attending: Internal Medicine | Admitting: Internal Medicine

## 2020-07-05 DIAGNOSIS — N210 Calculus in bladder: Secondary | ICD-10-CM | POA: Diagnosis not present

## 2020-07-05 DIAGNOSIS — C3490 Malignant neoplasm of unspecified part of unspecified bronchus or lung: Secondary | ICD-10-CM | POA: Insufficient documentation

## 2020-07-05 DIAGNOSIS — N200 Calculus of kidney: Secondary | ICD-10-CM | POA: Diagnosis not present

## 2020-07-05 DIAGNOSIS — R911 Solitary pulmonary nodule: Secondary | ICD-10-CM | POA: Diagnosis not present

## 2020-07-05 DIAGNOSIS — R918 Other nonspecific abnormal finding of lung field: Secondary | ICD-10-CM | POA: Diagnosis not present

## 2020-07-05 DIAGNOSIS — K5750 Diverticulosis of both small and large intestine without perforation or abscess without bleeding: Secondary | ICD-10-CM | POA: Diagnosis not present

## 2020-07-05 DIAGNOSIS — J8410 Pulmonary fibrosis, unspecified: Secondary | ICD-10-CM | POA: Diagnosis not present

## 2020-07-05 MED ORDER — IOHEXOL 300 MG/ML  SOLN
100.0000 mL | Freq: Once | INTRAMUSCULAR | Status: AC | PRN
  Administered 2020-07-05: 100 mL via INTRAVENOUS

## 2020-07-05 MED FILL — Dexamethasone Sodium Phosphate Inj 100 MG/10ML: INTRAMUSCULAR | Qty: 1 | Status: AC

## 2020-07-06 ENCOUNTER — Other Ambulatory Visit: Payer: Medicare HMO

## 2020-07-06 ENCOUNTER — Inpatient Hospital Stay: Payer: Medicare HMO | Admitting: Nutrition

## 2020-07-06 ENCOUNTER — Inpatient Hospital Stay: Payer: Medicare HMO | Attending: Internal Medicine | Admitting: Internal Medicine

## 2020-07-06 ENCOUNTER — Telehealth: Payer: Self-pay | Admitting: Nutrition

## 2020-07-06 ENCOUNTER — Inpatient Hospital Stay: Payer: Medicare HMO

## 2020-07-06 ENCOUNTER — Other Ambulatory Visit: Payer: Self-pay

## 2020-07-06 VITALS — BP 118/79 | HR 109 | Temp 97.1°F | Resp 20 | Ht 74.0 in | Wt 179.0 lb

## 2020-07-06 DIAGNOSIS — R5383 Other fatigue: Secondary | ICD-10-CM | POA: Insufficient documentation

## 2020-07-06 DIAGNOSIS — C3411 Malignant neoplasm of upper lobe, right bronchus or lung: Secondary | ICD-10-CM | POA: Diagnosis not present

## 2020-07-06 DIAGNOSIS — Z5111 Encounter for antineoplastic chemotherapy: Secondary | ICD-10-CM | POA: Diagnosis not present

## 2020-07-06 DIAGNOSIS — R630 Anorexia: Secondary | ICD-10-CM | POA: Diagnosis not present

## 2020-07-06 DIAGNOSIS — C3490 Malignant neoplasm of unspecified part of unspecified bronchus or lung: Secondary | ICD-10-CM

## 2020-07-06 DIAGNOSIS — Z8673 Personal history of transient ischemic attack (TIA), and cerebral infarction without residual deficits: Secondary | ICD-10-CM | POA: Insufficient documentation

## 2020-07-06 DIAGNOSIS — Z79899 Other long term (current) drug therapy: Secondary | ICD-10-CM | POA: Diagnosis not present

## 2020-07-06 DIAGNOSIS — R531 Weakness: Secondary | ICD-10-CM | POA: Diagnosis not present

## 2020-07-06 DIAGNOSIS — J90 Pleural effusion, not elsewhere classified: Secondary | ICD-10-CM | POA: Insufficient documentation

## 2020-07-06 DIAGNOSIS — R634 Abnormal weight loss: Secondary | ICD-10-CM | POA: Insufficient documentation

## 2020-07-06 LAB — CBC WITH DIFFERENTIAL (CANCER CENTER ONLY)
Abs Immature Granulocytes: 0.92 10*3/uL — ABNORMAL HIGH (ref 0.00–0.07)
Basophils Absolute: 0.1 10*3/uL (ref 0.0–0.1)
Basophils Relative: 1 %
Eosinophils Absolute: 0 10*3/uL (ref 0.0–0.5)
Eosinophils Relative: 0 %
HCT: 29.6 % — ABNORMAL LOW (ref 39.0–52.0)
Hemoglobin: 9.5 g/dL — ABNORMAL LOW (ref 13.0–17.0)
Immature Granulocytes: 5 %
Lymphocytes Relative: 4 %
Lymphs Abs: 0.7 10*3/uL (ref 0.7–4.0)
MCH: 31.8 pg (ref 26.0–34.0)
MCHC: 32.1 g/dL (ref 30.0–36.0)
MCV: 99 fL (ref 80.0–100.0)
Monocytes Absolute: 1 10*3/uL (ref 0.1–1.0)
Monocytes Relative: 5 %
Neutro Abs: 17.7 10*3/uL — ABNORMAL HIGH (ref 1.7–7.7)
Neutrophils Relative %: 85 %
Platelet Count: 276 10*3/uL (ref 150–400)
RBC: 2.99 MIL/uL — ABNORMAL LOW (ref 4.22–5.81)
RDW: 15.8 % — ABNORMAL HIGH (ref 11.5–15.5)
WBC Count: 20.5 10*3/uL — ABNORMAL HIGH (ref 4.0–10.5)
nRBC: 0 % (ref 0.0–0.2)

## 2020-07-06 LAB — CMP (CANCER CENTER ONLY)
ALT: 19 U/L (ref 0–44)
AST: 15 U/L (ref 15–41)
Albumin: 2.8 g/dL — ABNORMAL LOW (ref 3.5–5.0)
Alkaline Phosphatase: 93 U/L (ref 38–126)
Anion gap: 14 (ref 5–15)
BUN: 18 mg/dL (ref 8–23)
CO2: 19 mmol/L — ABNORMAL LOW (ref 22–32)
Calcium: 9.2 mg/dL (ref 8.9–10.3)
Chloride: 107 mmol/L (ref 98–111)
Creatinine: 1.15 mg/dL (ref 0.61–1.24)
GFR, Estimated: >60 mL/min (ref >60)
Glucose, Bld: 154 mg/dL — ABNORMAL HIGH (ref 70–99)
Potassium: 4.2 mmol/L (ref 3.5–5.1)
Sodium: 140 mmol/L (ref 135–145)
Total Bilirubin: 0.4 mg/dL (ref 0.3–1.2)
Total Protein: 6.3 g/dL — ABNORMAL LOW (ref 6.5–8.1)

## 2020-07-06 LAB — LACTATE DEHYDROGENASE: LDH: 202 U/L — ABNORMAL HIGH (ref 98–192)

## 2020-07-06 LAB — TSH: TSH: 0.479 u[IU]/mL (ref 0.320–4.118)

## 2020-07-06 NOTE — Telephone Encounter (Signed)
Attempted telephone follow-up with patient because infusion was canceled.  I was unable to leave a message.    He is followed by Dr. Julien Nordmann for lung cancer.  Noted disease progression with gemcitabine to begin on April 27.  Patient has been having fatigue and weakness but denies nausea vomiting constipation or diarrhea.  Weight was documented as 179 pounds today which is a 10% decrease over 2 months which is significant.  I will reschedule nutrition appointment for next infusion.  Patient has been given my contact information.  **Disclaimer: This note was dictated with voice recognition software. Similar sounding words can inadvertently be transcribed and this note may contain transcription errors which may not have been corrected upon publication of note.**

## 2020-07-06 NOTE — Progress Notes (Signed)
[facility] [phone]   [phone]  OFFICE PROGRESS NOTE  Copland, Gay Filler, MD [address] [place] [address]  DIAGNOSIS: Stage IV (T3, N2, M1a) non-small cell lung cancer, adenocarcinoma presented with large right upper lobe lung mass extending to the right suprahilar region in addition to right hilar and mediastinal lymphadenopathy with obstruction of the associated upper lobe bronchus and bilateral subcentimeter pulmonary nodules diagnosed in July 2021.  Biomarker Findings Microsatellite status - MS-Stable Tumor Mutational Burden - 3 Muts/Mb Genomic Findings For a complete list of the genes assayed, please refer to the Appendix. ERBB2 [id number], amplification - equivocal? HGF amplification - equivocal? MTAP loss exons 5-8 MYC amplification - equivocal? CDKN2A/B CDKN2B loss, CDKN2A loss CUL3 Y31f*19 RPTOR amplification TP53 [id number] Disease relevant genes with no reportable alterations: ALK, BRAF, EGFR, KRAS, MET, RET, ROS1  PDL1 Expression: Negative  PRIOR THERAPY:  1) Palliative radiotherapy to the obstructive lung mass under the care of Dr. MoLisbeth RenshawThe past treatment was scheduled for8/23/21. 2) Systemic chemotherapy with carboplatin for AUC of 5, Alimta 500 mg/M2 and Keytruda 200 mg IV every 3 weeks.  First dose November 04, 2019.  Status post 9 cycles.  Starting from cycle #5 he will be on maintenance treatment with Alimta and Keytruda every 3 weeks. 3) Second line systemic chemotherapy with docetaxel 75 mg/M2 and Cyramza 10 mg/KG every weeks with Neulasta support.  First dose May 18, 2020.  Status post 2 cycles.  Last dose was given June 17, 2020 discontinued secondary to disease progression.  CURRENT THERAPY: Third line systemic chemotherapy with single agent gemcitabine 1000 mg/M2 on days 1 and 8 every 3 weeks.  First dose July 13, 2020.  INTERVAL HISTORY: Micheal Oliver.o. male  returns to the clinic today for follow-up visit accompanied by his wife.  The patient continues to complain of increasing fatigue and weakness as well as shortness of breath and poor appetite.  He lost around 5 pounds since his last visit.  He denied having any current chest pain but has cough with no hemoptysis.  He denied having any fever or chills.  He has no nausea, vomiting, diarrhea or constipation.  He has no headache or visual changes.  He has a rough time tolerating the previous systemic chemotherapy with docetaxel and Cyramza.  The patient had repeat CT scan of the chest, abdomen pelvis performed recently and is here for evaluation and discussion of his scan results.  MEDICAL HISTORY: Past Medical History:  Diagnosis Date  . Chicken pox   . DM2 (diabetes mellitus, type 2) ([facility]  . History of kidney stones    passed several   . Hypertension   . Kidney stones    5-6 times  . Measles   . Mumps   . [place]   [place], last one was 09/18/2019- has some expressive aphasia    ALLERGIES:  is allergic to bee venom.  MEDICATIONS:  Current Outpatient Medications  Medication Sig Dispense Refill  . acetaminophen (TYLENOL) 500 MG tablet Take 500 mg by mouth every 6 (six) hours as needed.     . Marland Kitchenpixaban (ELIQUIS) 5 MG TABS tablet Take 1 tablet (5 mg total) by mouth 2 (two) times daily. 60 tablet 11  . atorvastatin (LIPITOR) 40 MG tablet Take 1 tablet (40 mg total) by mouth daily at 6 PM. 90 tablet 3  . dexamethasone (DECADRON) 4 MG tablet 2 tablet  p.o. twice daily the day before, day of and day after chemotherapy every 3 weeks. 40 tablet 1  . folic acid (FOLVITE) 1 MG tablet Take 1 tablet (1 mg total) by mouth daily. 90 tablet 3  . gabapentin (NEURONTIN) 300 MG capsule Take 1 capsule (300 mg total) by mouth at bedtime. 90 capsule 3  . HYDROcodone-homatropine (HYCODAN) 5-1.5 MG/5ML syrup Take 5 mLs by mouth every 6 (six) hours as needed for cough. 120 mL 0  . metFORMIN (GLUCOPHAGE) 500 MG  tablet TAKE TWO TABLETS BY MOUTH TWICE A DAY WITH MEAL(S) 360 tablet 3  . Omega-3 Fatty Acids (FISH OIL) 1200 MG CAPS Take by mouth daily.    . prochlorperazine (COMPAZINE) 10 MG tablet Take 1 tablet (10 mg total) by mouth every 6 (six) hours as needed. 30 tablet 2  . sitaGLIPtin (JANUVIA) 100 MG tablet Take 1 tablet (100 mg total) by mouth daily. 90 tablet 3   No current facility-administered medications for this visit.    SURGICAL HISTORY:  Past Surgical History:  Procedure Laterality Date  . COLONOSCOPY W/ POLYPECTOMY    . VIDEO BRONCHOSCOPY WITH ENDOBRONCHIAL ULTRASOUND N/A 09/30/2019   Procedure: VIDEO BRONCHOSCOPY WITH ENDOBRONCHIAL ULTRASOUND;  Surgeon: Collene Gobble, MD;  Location: [facility];  Service: Thoracic;  Laterality: N/A;  . WISDOM TOOTH EXTRACTION  1976-77    REVIEW OF SYSTEMS:  Constitutional: positive for anorexia, fatigue and weight loss Eyes: negative Ears, nose, mouth, throat, and face: negative Respiratory: positive for cough and dyspnea on exertion Cardiovascular: negative Gastrointestinal: negative Genitourinary:negative Integument/breast: negative Hematologic/lymphatic: negative Musculoskeletal:positive for muscle weakness Neurological: negative Behavioral/Psych: negative Endocrine: negative Allergic/Immunologic: negative   PHYSICAL EXAMINATION: General appearance: alert, cooperative, fatigued and no distress Head: Normocephalic, without obvious abnormality, atraumatic, Right eyelid droop Neck: no adenopathy, no JVD, supple, symmetrical, trachea midline and thyroid not enlarged, symmetric, no tenderness/mass/nodules Lymph nodes: Cervical, supraclavicular, and axillary nodes normal. Resp: diminished breath sounds RLL and dullness to percussion RLL Back: symmetric, no curvature. ROM normal. No CVA tenderness. Cardio: regular rate and rhythm, S1, S2 normal, no murmur, click, rub or gallop GI: soft, non-tender; bowel sounds normal; no masses,  no  organomegaly Extremities: extremities normal, atraumatic, no cyanosis or edema Neurologic: Alert and oriented X 3, normal strength and tone. Normal symmetric reflexes. Normal coordination and gait  ECOG PERFORMANCE STATUS: 1 - Symptomatic but completely ambulatory  Blood pressure 118/79, pulse (!) 109, temperature (!) 97.1 F (36.2 C), temperature source Tympanic, resp. rate 20, height _0  (1.88 m), weight 179 lb (81.2 kg), SpO2 97 %.  LABORATORY DATA: Lab Results  Component Value Date   WBC 20.5 (H) 07/06/2020   HGB 9.5 (L) 07/06/2020   HCT 29.6 (L) 07/06/2020   MCV 99.0 07/06/2020   PLT 276 07/06/2020      Chemistry      Component Value Date/Time   NA 137 06/29/2020 0801   K 4.1 06/29/2020 0801   CL 102 06/29/2020 0801   CO2 22 06/29/2020 0801   BUN 22 06/29/2020 0801   CREATININE 1.29 (H) 06/29/2020 0801   CREATININE 1.13 10/16/2013 1118      Component Value Date/Time   CALCIUM 8.7 (L) 06/29/2020 0801   ALKPHOS 130 (H) 06/29/2020 0801   AST 22 06/29/2020 0801   ALT 24 06/29/2020 0801   BILITOT 1.0 06/29/2020 0801       RADIOGRAPHIC STUDIES: CT Chest W Contrast  Result Date: 07/05/2020 CLINICAL DATA:  Primary Cancer Type: Lung Imaging Indication: Assess  response to therapy Interval therapy since last imaging? Yes Initial Cancer Diagnosis Date: 09/30/2019; Established by: Biopsy-proven Detailed Pathology: Stage IV non-small cell lung cancer, adenocarcinoma. Primary Tumor location:  Right upper lobe. Left lower lobe nodule. Surgeries: No. Chemotherapy: Yes; Ongoing? Yes; Most recent administration: 06/16/2020 Immunotherapy?  Yes; Type: Keytruda; Ongoing? No Radiation therapy? Yes; Date Range: 11/03/2019 - 11/23/2019; Target: Right upper lung EXAM: CT CHEST, ABDOMEN, AND PELVIS WITH CONTRAST TECHNIQUE: Multidetector CT imaging of the chest, abdomen and pelvis was performed following the standard protocol during bolus administration of intravenous contrast. CONTRAST:  130m  OMNIPAQUE IOHEXOL 300 MG/ML  SOLN COMPARISON:  Most recent CT chest, abdomen and pelvis 05/10/2020. 10/26/2019 PET-CT. FINDINGS: CT CHEST FINDINGS Cardiovascular: No significant vascular findings. Normal heart size. No pericardial effusion. Mediastinum/Nodes: No enlarged mediastinal, hilar, or axillary lymph nodes. Thyroid gland, trachea, and esophagus demonstrate no significant findings. Lungs/Pleura: Moderate right pleural effusion associated atelectasis or consolidation, similar to prior examination. Interval increase in fibrotic consolidation of the superior segment right lower lobe and paramedian upper lobes bilaterally (series 7, image 79, 64). Paramedian mass of the right upper lobe is not significantly changed, measuring approximately 4.2 x 2.5 cm (series 2, image 23). Dominant component of consolidation in the superior segment right lower lobe is not significantly changed in size, measuring approximately 5.5 x 3.2 cm (series 7, image 83) numerous small pulmonary nodules are generally increased in size, a spiculated nodule of the left lung base measuring 1.3 x 1.2 cm, previously 1.1 x 0.9 cm (series 7, image 104) and a spiculated nodule of the right lung base measuring 0.9 x 0.8 cm, previously 0.7 x 0.6 cm (series 7, image 106). Musculoskeletal: No chest wall mass or suspicious bone lesions identified. CT ABDOMEN PELVIS FINDINGS Hepatobiliary: No solid liver abnormality is seen. No gallstones, gallbladder wall thickening, or biliary dilatation. Pancreas: Unremarkable. No pancreatic ductal dilatation or surrounding inflammatory changes. Spleen: Normal in size without significant abnormality. Adrenals/Urinary Tract: Adrenal glands are unremarkable. Punctuate bilateral nonobstructive calculi and superior poles of the kidneys. No hydronephrosis. Calculus in the dependent urinary bladder measuring 1.7 cm. Stomach/Bowel: Stomach is within normal limits. Appendix appears normal. No evidence of bowel wall  thickening, distention, or inflammatory changes. Pancolonic diverticulosis, severe in the sigmoid. Vascular/Lymphatic: No significant vascular findings are present. No enlarged abdominal or pelvic lymph nodes. Reproductive: No mass or other abnormality. Other: No abdominal wall hernia or abnormality. No abdominopelvic ascites. Musculoskeletal: No acute or significant osseous findings. IMPRESSION: 1. Numerous bilateral pulmonary nodules continue to enlarge, consistent with worsened pulmonary metastatic disease. 2. Paramedian mass of the right upper lobe is not significantly changed. Dominant component of fibrotic consolidation in the superior segment right lower lobe is not significantly changed. 3. Interval increase in fibrotic consolidation of the superior segment right lower lobe and paramedian upper lobes bilaterally, consistent with ongoing development of radiation fibrosis. 4. Moderate right pleural effusion associated atelectasis or consolidation, similar to prior examination. 5. No evidence of metastatic disease in the abdomen or pelvis. 6. Punctuate bilateral nonobstructive nephrolithiasis. 7. Redemonstrated calculus in the dependent urinary bladder measuring 1.7 cm. Electronically Signed   By: AEddie CandleM.D.   On: 07/05/2020 11:15   CT Abdomen Pelvis W Contrast  Result Date: 07/05/2020 CLINICAL DATA:  Primary Cancer Type: Lung Imaging Indication: Assess response to therapy Interval therapy since last imaging? Yes Initial Cancer Diagnosis Date: 09/30/2019; Established by: Biopsy-proven Detailed Pathology: Stage IV non-small cell lung cancer, adenocarcinoma. Primary Tumor location:  Right upper lobe.  Left lower lobe nodule. Surgeries: No. Chemotherapy: Yes; Ongoing? Yes; Most recent administration: 06/16/2020 Immunotherapy?  Yes; Type: Keytruda; Ongoing? No Radiation therapy? Yes; Date Range: 11/03/2019 - 11/23/2019; Target: Right upper lung EXAM: CT CHEST, ABDOMEN, AND PELVIS WITH CONTRAST TECHNIQUE:  Multidetector CT imaging of the chest, abdomen and pelvis was performed following the standard protocol during bolus administration of intravenous contrast. CONTRAST:  143m OMNIPAQUE IOHEXOL 300 MG/ML  SOLN COMPARISON:  Most recent CT chest, abdomen and pelvis 05/10/2020. 10/26/2019 PET-CT. FINDINGS: CT CHEST FINDINGS Cardiovascular: No significant vascular findings. Normal heart size. No pericardial effusion. Mediastinum/Nodes: No enlarged mediastinal, hilar, or axillary lymph nodes. Thyroid gland, trachea, and esophagus demonstrate no significant findings. Lungs/Pleura: Moderate right pleural effusion associated atelectasis or consolidation, similar to prior examination. Interval increase in fibrotic consolidation of the superior segment right lower lobe and paramedian upper lobes bilaterally (series 7, image 79, 64). Paramedian mass of the right upper lobe is not significantly changed, measuring approximately 4.2 x 2.5 cm (series 2, image 23). Dominant component of consolidation in the superior segment right lower lobe is not significantly changed in size, measuring approximately 5.5 x 3.2 cm (series 7, image 83) numerous small pulmonary nodules are generally increased in size, a spiculated nodule of the left lung base measuring 1.3 x 1.2 cm, previously 1.1 x 0.9 cm (series 7, image 104) and a spiculated nodule of the right lung base measuring 0.9 x 0.8 cm, previously 0.7 x 0.6 cm (series 7, image 106). Musculoskeletal: No chest wall mass or suspicious bone lesions identified. CT ABDOMEN PELVIS FINDINGS Hepatobiliary: No solid liver abnormality is seen. No gallstones, gallbladder wall thickening, or biliary dilatation. Pancreas: Unremarkable. No pancreatic ductal dilatation or surrounding inflammatory changes. Spleen: Normal in size without significant abnormality. Adrenals/Urinary Tract: Adrenal glands are unremarkable. Punctuate bilateral nonobstructive calculi and superior poles of the kidneys. No  hydronephrosis. Calculus in the dependent urinary bladder measuring 1.7 cm. Stomach/Bowel: Stomach is within normal limits. Appendix appears normal. No evidence of bowel wall thickening, distention, or inflammatory changes. Pancolonic diverticulosis, severe in the sigmoid. Vascular/Lymphatic: No significant vascular findings are present. No enlarged abdominal or pelvic lymph nodes. Reproductive: No mass or other abnormality. Other: No abdominal wall hernia or abnormality. No abdominopelvic ascites. Musculoskeletal: No acute or significant osseous findings. IMPRESSION: 1. Numerous bilateral pulmonary nodules continue to enlarge, consistent with worsened pulmonary metastatic disease. 2. Paramedian mass of the right upper lobe is not significantly changed. Dominant component of fibrotic consolidation in the superior segment right lower lobe is not significantly changed. 3. Interval increase in fibrotic consolidation of the superior segment right lower lobe and paramedian upper lobes bilaterally, consistent with ongoing development of radiation fibrosis. 4. Moderate right pleural effusion associated atelectasis or consolidation, similar to prior examination. 5. No evidence of metastatic disease in the abdomen or pelvis. 6. Punctuate bilateral nonobstructive nephrolithiasis. 7. Redemonstrated calculus in the dependent urinary bladder measuring 1.7 cm. Electronically Signed   By: AEddie CandleM.D.   On: 07/05/2020 11:15   CUP PACEART REMOTE DEVICE CHECK  Result Date: 06/26/2020 ILR summary report received. Battery status OK. Normal device function. No new symptom, tachy, brady, or pause episodes. 1 AF episode, duration 2 minutes. On Eliquis. Monthly summary reports and ROV/PRN   ASSESSMENT AND PLAN: This is a very pleasant 70years old white male recently diagnosed with a stage IV (T3, N2, M1 a) non-small cell lung cancer, adenocarcinoma presented with large right upper lobe lung mass with extension to the right  suprahilar  region as well as right hilar and mediastinal lymphadenopathy with obstruction of the right upper lobe and bilateral pulmonary nodules diagnosed in July 2021. The molecular study shows no actionable mutations and PD-L1 expression is negative. He had a short course of palliative radiotherapy to the obstructive lung mass under the care of Dr. Lisbeth Renshaw. The patient underwent systemic chemotherapy with carboplatin for AUC of 5, Alimta 500 mg/M2 and Keytruda 200 mg IV every 3 weeks status post 9 cycles.  Starting from cycle #5 he will be on maintenance treatment with Alimta and Keytruda every 3 weeks.  He has been tolerating this treatment well with no concerning complaints except for fatigue and cough.  This treatment was discontinued secondary to disease progression. The patient underwent palliative second line systemic chemotherapy with docetaxel 75 mg/M2 and Cyramza 10 mg/KG every 3 weeks with Neulasta support.  Started May 18, 2020.  Status post 2 cycles. The patient has a rough time tolerating this treatment. He had repeat CT scan of the chest, abdomen pelvis performed recently.  I personally and independently reviewed the scan images and discussed the results with the patient and his wife today. Unfortunately his scan showed evidence for disease progression and multiple bilateral pulmonary nodules but stable disease in the dominant mass and right pleural effusion. I had a lengthy discussion with the patient and his wife about his current condition and treatment options. I gave the patient the option of palliative care versus palliative systemic chemotherapy with single agent gemcitabine 1000 MG/M2 IV on days 1 and 8 every 3 weeks.  The patient is interested in proceeding with systemic chemotherapy and he is expected to start the first dose of this treatment next week. I discussed with him the adverse effect of this treatment including but not limited to mild alopecia, myelosuppression,  nausea and vomiting, peripheral neuropathy, liver or renal dysfunction. He will come back for follow-up visit in 4 weeks for evaluation before starting cycle #2. For the right pleural effusion, I offered the patient an ultrasound-guided thoracentesis but he would like to hold on it for now. He was advised to call immediately if he has any other concerning symptoms in the interval. The patient voices understanding of current disease status and treatment options and is in agreement with the current care plan.  All questions were answered. The patient knows to call the clinic with any problems, questions or concerns. We can certainly see the patient much sooner if necessary.  Disclaimer: This note was dictated with voice recognition software. Similar sounding words can inadvertently be transcribed and may not be corrected upon review.

## 2020-07-06 NOTE — Progress Notes (Signed)
DISCONTINUE ON PATHWAY REGIMEN - Non-Small Cell Lung     A cycle is every 21 days:     Ramucirumab      Docetaxel   **Always confirm dose/schedule in your pharmacy ordering system**  REASON: Disease Progression PRIOR TREATMENT: [id number]: Docetaxel 75 mg/m2 + Ramucirumab 10 mg/kg q21 Days Until Progression or Unacceptable Toxicity TREATMENT RESPONSE: Progressive Disease (PD)  START OFF PATHWAY REGIMEN - Non-Small Cell Lung   [id number]:Gemcitabine 1,000 mg/m2 D1, 8  q21 Days:   A cycle is every 21 days:     Gemcitabine   **Always confirm dose/schedule in your pharmacy ordering system**  Patient Characteristics: Stage IV Metastatic, Nonsquamous, Molecular Analysis Completed, Molecular Alteration Present and Targeted Therapy Exhausted OR EGFR Exon 20+ or KRAS G12C+ Present and No Prior Chemo/Immunotherapy OR No Alteration Present, Third Line -  Chemotherapy/Immunotherapy, PS = 0, 1, Prior PD-1/PD-L1 Inhibitor or No Prior PD-1/PD-L1 Inhibitor and Not a Candidate for Immunotherapy Therapeutic Status: Stage IV Metastatic Histology: Nonsquamous Cell Broad Molecular Profiling Status: Molecular Analysis Completed Molecular Analysis Results: No Alteration Present ECOG Performance Status: 1 Chemotherapy/Immunotherapy Line of Therapy: Third Line Chemotherapy/Immunotherapy Immunotherapy Candidate Status: Not a Candidate for Immunotherapy Prior Immunotherapy Status: Prior PD-1/PD-L1 Inhibitor Intent of Therapy: Non-Curative / Palliative Intent, Discussed with Patient

## 2020-07-07 NOTE — Progress Notes (Signed)
[facility] Summary Report / Loop Recorder 

## 2020-07-08 ENCOUNTER — Inpatient Hospital Stay: Payer: Medicare HMO

## 2020-07-13 ENCOUNTER — Inpatient Hospital Stay: Payer: Medicare HMO

## 2020-07-13 ENCOUNTER — Other Ambulatory Visit: Payer: Medicare HMO

## 2020-07-13 ENCOUNTER — Other Ambulatory Visit: Payer: Self-pay

## 2020-07-13 VITALS — BP 129/82 | HR 116 | Temp 98.7°F | Resp 18 | Wt 176.2 lb

## 2020-07-13 DIAGNOSIS — C3411 Malignant neoplasm of upper lobe, right bronchus or lung: Secondary | ICD-10-CM | POA: Diagnosis not present

## 2020-07-13 DIAGNOSIS — Z79899 Other long term (current) drug therapy: Secondary | ICD-10-CM | POA: Diagnosis not present

## 2020-07-13 DIAGNOSIS — R5383 Other fatigue: Secondary | ICD-10-CM | POA: Diagnosis not present

## 2020-07-13 DIAGNOSIS — Z8673 Personal history of transient ischemic attack (TIA), and cerebral infarction without residual deficits: Secondary | ICD-10-CM | POA: Diagnosis not present

## 2020-07-13 DIAGNOSIS — R634 Abnormal weight loss: Secondary | ICD-10-CM | POA: Diagnosis not present

## 2020-07-13 DIAGNOSIS — Z5111 Encounter for antineoplastic chemotherapy: Secondary | ICD-10-CM | POA: Diagnosis not present

## 2020-07-13 DIAGNOSIS — J90 Pleural effusion, not elsewhere classified: Secondary | ICD-10-CM | POA: Diagnosis not present

## 2020-07-13 DIAGNOSIS — R630 Anorexia: Secondary | ICD-10-CM | POA: Diagnosis not present

## 2020-07-13 DIAGNOSIS — R531 Weakness: Secondary | ICD-10-CM | POA: Diagnosis not present

## 2020-07-13 LAB — CMP (CANCER CENTER ONLY)
ALT: 14 U/L (ref 0–44)
AST: 13 U/L — ABNORMAL LOW (ref 15–41)
Albumin: 2.7 g/dL — ABNORMAL LOW (ref 3.5–5.0)
Alkaline Phosphatase: 72 U/L (ref 38–126)
Anion gap: 13 (ref 5–15)
BUN: 18 mg/dL (ref 8–23)
CO2: 25 mmol/L (ref 22–32)
Calcium: 9.1 mg/dL (ref 8.9–10.3)
Chloride: 103 mmol/L (ref 98–111)
Creatinine: 1.14 mg/dL (ref 0.61–1.24)
GFR, Estimated: >60 mL/min (ref >60)
Glucose, Bld: 221 mg/dL — ABNORMAL HIGH (ref 70–99)
Potassium: 4.3 mmol/L (ref 3.5–5.1)
Sodium: 141 mmol/L (ref 135–145)
Total Bilirubin: 0.6 mg/dL (ref 0.3–1.2)
Total Protein: 6.1 g/dL — ABNORMAL LOW (ref 6.5–8.1)

## 2020-07-13 LAB — CBC WITH DIFFERENTIAL (CANCER CENTER ONLY)
Abs Immature Granulocytes: 0.3 10*3/uL — ABNORMAL HIGH (ref 0.00–0.07)
Basophils Absolute: 0.1 10*3/uL (ref 0.0–0.1)
Basophils Relative: 1 %
Eosinophils Absolute: 0.1 10*3/uL (ref 0.0–0.5)
Eosinophils Relative: 0 %
HCT: 32.7 % — ABNORMAL LOW (ref 39.0–52.0)
Hemoglobin: 10.2 g/dL — ABNORMAL LOW (ref 13.0–17.0)
Immature Granulocytes: 2 %
Lymphocytes Relative: 5 %
Lymphs Abs: 0.9 10*3/uL (ref 0.7–4.0)
MCH: 31.5 pg (ref 26.0–34.0)
MCHC: 31.2 g/dL (ref 30.0–36.0)
MCV: 100.9 fL — ABNORMAL HIGH (ref 80.0–100.0)
Monocytes Absolute: 1.2 10*3/uL — ABNORMAL HIGH (ref 0.1–1.0)
Monocytes Relative: 7 %
Neutro Abs: 15.4 10*3/uL — ABNORMAL HIGH (ref 1.7–7.7)
Neutrophils Relative %: 85 %
Platelet Count: 266 10*3/uL (ref 150–400)
RBC: 3.24 MIL/uL — ABNORMAL LOW (ref 4.22–5.81)
RDW: 16.1 % — ABNORMAL HIGH (ref 11.5–15.5)
WBC Count: 18 10*3/uL — ABNORMAL HIGH (ref 4.0–10.5)
nRBC: 0 % (ref 0.0–0.2)

## 2020-07-13 LAB — TSH: TSH: 1.396 u[IU]/mL (ref 0.320–4.118)

## 2020-07-13 MED ORDER — PROCHLORPERAZINE MALEATE 10 MG PO TABS
ORAL_TABLET | ORAL | Status: AC
  Filled 2020-07-13: qty 1

## 2020-07-13 MED ORDER — PROCHLORPERAZINE MALEATE 10 MG PO TABS
10.0000 mg | ORAL_TABLET | Freq: Once | ORAL | Status: AC
  Administered 2020-07-13: 10 mg via ORAL

## 2020-07-13 MED ORDER — SODIUM CHLORIDE 0.9 % IV SOLN
2000.0000 mg | Freq: Once | INTRAVENOUS | Status: AC
  Administered 2020-07-13: 2000 mg via INTRAVENOUS
  Filled 2020-07-13: qty 52.6

## 2020-07-13 MED ORDER — SODIUM CHLORIDE 0.9 % IV SOLN
Freq: Once | INTRAVENOUS | Status: AC
Start: 2020-07-13 — End: 2020-07-13
  Filled 2020-07-13: qty 250

## 2020-07-13 NOTE — Progress Notes (Signed)
Per Dr. Julien Nordmann, okay for patient to receive treatment today with HR 116.

## 2020-07-13 NOTE — Patient Instructions (Signed)
[facility] Discharge Instructions for Patients Receiving Chemotherapy  Today you received the following chemotherapy agents: Gemzar  To help prevent nausea and vomiting after your treatment, we encourage you to take your nausea medication as needed.   If you develop nausea and vomiting that is not controlled by your nausea medication, call the clinic.   BELOW ARE SYMPTOMS THAT SHOULD BE REPORTED IMMEDIATELY:  *FEVER GREATER THAN 100.5 F  *CHILLS WITH OR WITHOUT FEVER  NAUSEA AND VOMITING THAT IS NOT CONTROLLED WITH YOUR NAUSEA MEDICATION  *UNUSUAL SHORTNESS OF BREATH  *UNUSUAL BRUISING OR BLEEDING  TENDERNESS IN MOUTH AND THROAT WITH OR WITHOUT PRESENCE OF ULCERS  *URINARY PROBLEMS  *BOWEL PROBLEMS  UNUSUAL RASH Items with * indicate a potential emergency and should be followed up as soon as possible.  Feel free to call the clinic should you have any questions or concerns. The clinic phone number is [phone].  Please show the Progress at check-in to the Emergency Department and triage nurse.  Gemcitabine injection What is this medicine? GEMCITABINE (jem SYE ta been) is a chemotherapy drug. This medicine is used to treat many types of cancer like breast cancer, lung cancer, pancreatic cancer, and ovarian cancer. This medicine may be used for other purposes; ask your health care provider or pharmacist if you have questions. COMMON BRAND NAME(S): Gemzar, Infugem What should I tell my health care provider before I take this medicine? They need to know if you have any of these conditions:  blood disorders  infection  kidney disease  liver disease  lung or breathing disease, like asthma  recent or ongoing radiation therapy  an unusual or allergic reaction to gemcitabine, other chemotherapy, other medicines, foods, dyes, or preservatives  pregnant or trying to get pregnant  breast-feeding How should I use this medicine? This drug is  given as an infusion into a vein. It is administered in a hospital or clinic by a specially trained health care professional. Talk to your pediatrician regarding the use of this medicine in children. Special care may be needed. Overdosage: If you think you have taken too much of this medicine contact a poison control center or emergency room at once. NOTE: This medicine is only for you. Do not share this medicine with others. What if I miss a dose? It is important not to miss your dose. Call your doctor or health care professional if you are unable to keep an appointment. What may interact with this medicine?  medicines to increase blood counts like filgrastim, pegfilgrastim, sargramostim  some other chemotherapy drugs like cisplatin  vaccines Talk to your doctor or health care professional before taking any of these medicines:  acetaminophen  aspirin  ibuprofen  ketoprofen  naproxen This list may not describe all possible interactions. Give your health care provider a list of all the medicines, herbs, non-prescription drugs, or dietary supplements you use. Also tell them if you smoke, drink alcohol, or use illegal drugs. Some items may interact with your medicine. What should I watch for while using this medicine? Visit your doctor for checks on your progress. This drug may make you feel generally unwell. This is not uncommon, as chemotherapy can affect healthy cells as well as cancer cells. Report any side effects. Continue your course of treatment even though you feel ill unless your doctor tells you to stop. In some cases, you may be given additional medicines to help with side effects. Follow all directions for their use. Call  your doctor or health care professional for advice if you get a fever, chills or sore throat, or other symptoms of a cold or flu. Do not treat yourself. This drug decreases your body's ability to fight infections. Try to avoid being around people who are  sick. This medicine may increase your risk to bruise or bleed. Call your doctor or health care professional if you notice any unusual bleeding. Be careful brushing and flossing your teeth or using a toothpick because you may get an infection or bleed more easily. If you have any dental work done, tell your dentist you are receiving this medicine. Avoid taking products that contain aspirin, acetaminophen, ibuprofen, naproxen, or ketoprofen unless instructed by your doctor. These medicines may hide a fever. Do not become pregnant while taking this medicine or for 6 months after stopping it. Women should inform their doctor if they wish to become pregnant or think they might be pregnant. Men should not father a child while taking this medicine and for 3 months after stopping it. There is a potential for serious side effects to an unborn child. Talk to your health care professional or pharmacist for more information. Do not breast-feed an infant while taking this medicine or for at least 1 week after stopping it. Men should inform their doctors if they wish to father a child. This medicine may lower sperm counts. Talk with your doctor or health care professional if you are concerned about your fertility. What side effects may I notice from receiving this medicine? Side effects that you should report to your doctor or health care professional as soon as possible:  allergic reactions like skin rash, itching or hives, swelling of the face, lips, or tongue  breathing problems  pain, redness, or irritation at site where injected  signs and symptoms of a dangerous change in heartbeat or heart rhythm like chest pain; dizziness; fast or irregular heartbeat; palpitations; feeling faint or lightheaded, falls; breathing problems  signs of decreased platelets or bleeding - bruising, pinpoint red spots on the skin, black, tarry stools, blood in the urine  signs of decreased red blood cells - unusually weak or  tired, feeling faint or lightheaded, falls  signs of infection - fever or chills, cough, sore throat, pain or difficulty passing urine  signs and symptoms of kidney injury like trouble passing urine or change in the amount of urine  signs and symptoms of liver injury like dark yellow or brown urine; general ill feeling or flu-like symptoms; light-colored stools; loss of appetite; nausea; right upper belly pain; unusually weak or tired; yellowing of the eyes or skin  swelling of ankles, feet, hands Side effects that usually do not require medical attention (report to your doctor or health care professional if they continue or are bothersome):  constipation  diarrhea  hair loss  loss of appetite  nausea  rash  vomiting This list may not describe all possible side effects. Call your doctor for medical advice about side effects. You may report side effects to FDA at 1-800-FDA-1088. Where should I keep my medicine? This drug is given in a hospital or clinic and will not be stored at home. NOTE: This sheet is a summary. It may not cover all possible information. If you have questions about this medicine, talk to your doctor, pharmacist, or health care provider.  2021 Elsevier/Gold Standard (2017-06-12 18:06:11)

## 2020-07-14 MED ORDER — PALONOSETRON HCL INJECTION 0.25 MG/5ML
INTRAVENOUS | Status: AC
  Filled 2020-07-14: qty 5

## 2020-07-15 ENCOUNTER — Other Ambulatory Visit: Payer: Self-pay

## 2020-07-15 ENCOUNTER — Ambulatory Visit (HOSPITAL_COMMUNITY)
Admission: RE | Admit: 2020-07-15 | Discharge: 2020-07-15 | Disposition: A | Discharge status: Home / Self Care | Payer: Medicare HMO | Source: Ambulatory Visit | Attending: Internal Medicine | Admitting: Internal Medicine

## 2020-07-15 DIAGNOSIS — H748X3 Other specified disorders of middle ear and mastoid, bilateral: Secondary | ICD-10-CM | POA: Diagnosis not present

## 2020-07-15 DIAGNOSIS — C3490 Malignant neoplasm of unspecified part of unspecified bronchus or lung: Secondary | ICD-10-CM | POA: Insufficient documentation

## 2020-07-15 DIAGNOSIS — I6389 Other cerebral infarction: Secondary | ICD-10-CM | POA: Diagnosis not present

## 2020-07-15 DIAGNOSIS — J3489 Other specified disorders of nose and nasal sinuses: Secondary | ICD-10-CM | POA: Diagnosis not present

## 2020-07-15 DIAGNOSIS — J321 Chronic frontal sinusitis: Secondary | ICD-10-CM | POA: Diagnosis not present

## 2020-07-15 MED ORDER — GADOBUTROL 1 MMOL/ML IV SOLN
8.0000 mL | Freq: Once | INTRAVENOUS | Status: AC | PRN
  Administered 2020-07-15: 8 mL via INTRAVENOUS

## 2020-07-18 ENCOUNTER — Ambulatory Visit (HOSPITAL_COMMUNITY)
Admission: RE | Admit: 2020-07-18 | Discharge: 2020-07-18 | Disposition: A | Discharge status: Home / Self Care | Payer: Medicare HMO | Source: Ambulatory Visit | Attending: Physician Assistant | Admitting: Physician Assistant

## 2020-07-18 ENCOUNTER — Encounter (HOSPITAL_COMMUNITY): Payer: Self-pay | Admitting: Physician Assistant

## 2020-07-18 ENCOUNTER — Other Ambulatory Visit: Payer: Self-pay

## 2020-07-18 VITALS — BP 110/74 | HR 109 | Ht 74.0 in | Wt 176.8 lb

## 2020-07-18 DIAGNOSIS — D6869 Other thrombophilia: Secondary | ICD-10-CM | POA: Diagnosis not present

## 2020-07-18 DIAGNOSIS — I10 Essential (primary) hypertension: Secondary | ICD-10-CM | POA: Diagnosis not present

## 2020-07-18 DIAGNOSIS — C3490 Malignant neoplasm of unspecified part of unspecified bronchus or lung: Secondary | ICD-10-CM | POA: Diagnosis not present

## 2020-07-18 DIAGNOSIS — Z7901 Long term (current) use of anticoagulants: Secondary | ICD-10-CM | POA: Insufficient documentation

## 2020-07-18 DIAGNOSIS — I480 Paroxysmal atrial fibrillation: Secondary | ICD-10-CM

## 2020-07-18 DIAGNOSIS — E785 Hyperlipidemia, unspecified: Secondary | ICD-10-CM | POA: Insufficient documentation

## 2020-07-18 NOTE — Progress Notes (Signed)
Primary Care Physician: Micheal Mclean, MD Primary Electrophysiologist: Dr Curt Bears Referring Physician: Dr Reggy Eye [name] with a history of DM, HTN, HLD, prior CVA, lung cancer, and atrial fibrillation who presents for follow up in the [facility]. Patient had a cryptogenic stroke 09/2019 and underwent ILR implant on 12/2019 with Dr Curt Bears. The patient was initially diagnosed with atrial fibrillation 04/07/20 on ILR with a ~ 5 hour episode. This occurred during the night while the patient was asleep. Patient was started on Eliquis for a CHADS2VASC score of 5.  On follow up today, patient reports he is doing well from a cardiac standpoint. ILR shows 1.6% afib burden. He is unaware of his arrhythmia. He denies any bleeding issues on anticoagulation. He is currently undergoing palliative chemotherapy.    Today, he denies symptoms of palpitations, chest pain, shortness of breath, orthopnea, PND, lower extremity edema, dizziness, presyncope, syncope, snoring, daytime somnolence, bleeding, or neurologic sequela. The patient is tolerating medications without difficulties and is otherwise without complaint today.    Atrial Fibrillation Risk Factors:  he does not have symptoms or diagnosis of sleep apnea. he does not have a history of rheumatic fever. he does not have a history of alcohol use. The patient does not have a history of early familial atrial fibrillation or other arrhythmias.  he has a BMI of Body mass index is 22.7 kg/m.Marland Kitchen Filed Weights   07/18/20 0904  Weight: 80.2 kg    Family History  Problem Relation Age of Onset  . Alcohol abuse Father 22       Deceased  . Heart disease Father   . Lung disease Father   . Hypertension Father   . Stomach cancer Mother 79       Deceased  . Hypertension Paternal Grandmother   . Dementia Paternal Grandmother   . Stroke Paternal Grandmother   . Arthritis Maternal Grandmother   .  Sudden death Paternal Grandfather   . Sudden death Maternal Grandfather   . Heart attack Paternal Aunt   . Other Maternal Aunt        Natural Causes  . Healthy Son   . Anxiety disorder Daughter      Atrial Fibrillation Management history:  Previous antiarrhythmic drugs: none Previous cardioversions: none Previous ablations: none CHADS2VASC score: 5 Anticoagulation history: Eliquis   Past Medical History:  Diagnosis Date  . Chicken pox   . DM2 (diabetes mellitus, type 2) ([place])   . History of kidney stones    passed several   . Hypertension   . Kidney stones    5-6 times  . Measles   . Mumps   . Stroke ([place])    x 2, last one was 09/18/2019- has some expressive aphasia   Past Surgical History:  Procedure Laterality Date  . COLONOSCOPY W/ POLYPECTOMY    . VIDEO BRONCHOSCOPY WITH ENDOBRONCHIAL ULTRASOUND N/A 09/30/2019   Procedure: VIDEO BRONCHOSCOPY WITH ENDOBRONCHIAL ULTRASOUND;  Surgeon: Collene Gobble, MD;  Location: [facility];  Service: Thoracic;  Laterality: N/A;  . WISDOM TOOTH EXTRACTION  1976-77    Current Outpatient Medications  Medication Sig Dispense Refill  . acetaminophen (TYLENOL) 500 MG tablet Take 500 mg by mouth every 6 (six) hours as needed.     Marland Kitchen apixaban (ELIQUIS) 5 MG TABS tablet Take 1 tablet (5 mg total) by mouth 2 (two) times daily. 60 tablet 11  . atorvastatin (LIPITOR) 40 MG tablet  Take 1 tablet (40 mg total) by mouth daily at 6 PM. 90 tablet 3  . folic acid (FOLVITE) 1 MG tablet Take 1 tablet (1 mg total) by mouth daily. 90 tablet 3  . gabapentin (NEURONTIN) 300 MG capsule Take 1 capsule (300 mg total) by mouth at bedtime. 90 capsule 3  . HYDROcodone-homatropine (HYCODAN) 5-1.5 MG/5ML syrup Take 5 mLs by mouth every 6 (six) hours as needed for cough. 120 mL 0  . metFORMIN (GLUCOPHAGE) 500 MG tablet TAKE TWO TABLETS BY MOUTH TWICE A DAY WITH MEAL(S) 360 tablet 3  . Omega-3 Fatty Acids (FISH OIL) 1200 MG CAPS Take by mouth daily.    .  prochlorperazine (COMPAZINE) 10 MG tablet Take 1 tablet (10 mg total) by mouth every 6 (six) hours as needed. 30 tablet 2  . sitaGLIPtin (JANUVIA) 100 MG tablet Take 1 tablet (100 mg total) by mouth daily. 90 tablet 3   No current facility-administered medications for this encounter.    Allergies  Allergen Reactions  . Bee Venom     Lip swelling    Social History   Socioeconomic History  . Marital status: Married    Spouse name: Micheal Oliver  . Number of children: 2  . Years of education: MBA  . Highest education level: Not on file  Occupational History    Comment: retired, Banker   Tobacco Use  . Smoking status: Never Smoker  . Smokeless tobacco: Never Used  Vaping Use  . Vaping Use: Never used  Substance and Sexual Activity  . Alcohol use: No  . Drug use: No  . Sexual activity: Yes  Other Topics Concern  . Not on file  Social History Narrative   Lives at home with wife   Caffeine use- 15-20oz per day   Right handed    Social Determinants of Health   Financial Resource Strain: Not on file  Food Insecurity: Not on file  Transportation Needs: Not on file  Physical Activity: Not on file  Stress: Not on file  Social Connections: Not on file  Intimate Partner Violence: Not on file     ROS- All systems are reviewed and negative except as per the HPI above.  Physical Exam: Vitals:   07/18/20 0904  BP: 110/74  Pulse: (!) 109  Weight: 80.2 kg  Height: 6\' 2"  (1.88 m)    GEN- The patient is a well appearing male, alert and oriented x 3 today.   HEENT-head normocephalic, atraumatic, sclera clear, conjunctiva pink, hearing intact, trachea midline. Lungs- mild wheezing, normal work of breathing Heart- Regular rate and rhythm, tachycardia, no murmurs, rubs or gallops  GI- soft, NT, ND, + BS Extremities- no clubbing, cyanosis, or edema MS- no significant deformity or atrophy Skin- no rash or lesion Psych- euthymic mood, full affect Neuro- strength and sensation are  intact   Wt Readings from Last 3 Encounters:  07/18/20 80.2 kg  07/13/20 79.9 kg  07/06/20 81.2 kg    EKG today demonstrates  Sinus tach Vent. rate 109 BPM PR interval 142 ms QRS duration 76 ms QT/QTcB 312/420 ms  Echo 09/19/19 demonstrated  1. Left ventricular ejection fraction, by estimation, is 60 to 65%. The  left ventricle has normal function. The left ventricle has no regional  wall motion abnormalities. There is moderate concentric left ventricular  hypertrophy. Left ventricular  diastolic parameters are consistent with Grade I diastolic dysfunction  (impaired relaxation).  2. Right ventricular systolic function is normal. The right ventricular  size is  normal.  3. Left atrial size was mildly dilated.  4. The mitral valve is normal in structure. Mild mitral valve  regurgitation. No evidence of mitral stenosis.  5. The aortic valve is normal in structure. Aortic valve regurgitation is  trivial. No aortic stenosis is present.  6. The inferior vena cava is normal in size with greater than 50%  respiratory variability, suggesting right atrial pressure of 3 mmHg.   Conclusion(s)/Recommendation(s): No intracardiac source of embolism detected on this transthoracic study. A transesophageal echocardiogram is recommended to exclude cardiac source of embolism if clinically indicated.   Epic records are reviewed at length today  CHA2DS2-VASc Score = 5  The patient's score is based upon: CHF History: No HTN History: Yes Diabetes History: Yes Stroke History: Yes Vascular Disease History: No Age Score: 1 Gender Score: 0      ASSESSMENT AND PLAN: 1. Paroxysmal Atrial Fibrillation (ICD10:  I48.0) The patient's CHA2DS2-VASc score is 5, indicating a 7.2% annual risk of stroke.   Patient in sinus tachycardia today. AF burden on ILR 1.6%.  We discussed rate controlling medication today and patient states "I don't want to take another pill." Continue with present therapy  for now. Continue Eliquis 5 mg BID  2. Secondary Hypercoagulable State (ICD10:  D68.69) The patient is at significant risk for stroke/thromboembolism based upon his CHA2DS2-VASc Score of 5.  Continue Apixaban (Eliquis).   3. HTN Stable, no changes today.  4. Lung cancer Stage IV, followed by Dr Julien Nordmann.   Follow up in the AF clinic in 6 months.    [facility] [address] [place], [place] [address] [phone] 07/18/2020 9:08 AM

## 2020-07-19 ENCOUNTER — Encounter: Payer: Self-pay | Admitting: Family Medicine

## 2020-07-20 ENCOUNTER — Other Ambulatory Visit: Payer: Self-pay

## 2020-07-20 ENCOUNTER — Inpatient Hospital Stay: Payer: Medicare HMO

## 2020-07-20 ENCOUNTER — Other Ambulatory Visit: Payer: Medicare HMO

## 2020-07-20 ENCOUNTER — Ambulatory Visit: Payer: Medicare HMO

## 2020-07-20 ENCOUNTER — Ambulatory Visit: Payer: Medicare HMO | Admitting: Physician Assistant

## 2020-07-20 ENCOUNTER — Other Ambulatory Visit: Payer: Self-pay | Admitting: Internal Medicine

## 2020-07-20 ENCOUNTER — Ambulatory Visit (HOSPITAL_BASED_OUTPATIENT_CLINIC_OR_DEPARTMENT_OTHER): Payer: Medicare HMO | Admitting: Medical

## 2020-07-20 VITALS — BP 117/72 | HR 74 | Temp 98.8°F | Resp 18

## 2020-07-20 DIAGNOSIS — R059 Cough, unspecified: Secondary | ICD-10-CM

## 2020-07-20 DIAGNOSIS — R531 Weakness: Secondary | ICD-10-CM | POA: Diagnosis not present

## 2020-07-20 DIAGNOSIS — C3411 Malignant neoplasm of upper lobe, right bronchus or lung: Secondary | ICD-10-CM

## 2020-07-20 DIAGNOSIS — Z8673 Personal history of transient ischemic attack (TIA), and cerebral infarction without residual deficits: Secondary | ICD-10-CM | POA: Diagnosis not present

## 2020-07-20 DIAGNOSIS — J90 Pleural effusion, not elsewhere classified: Secondary | ICD-10-CM | POA: Diagnosis not present

## 2020-07-20 DIAGNOSIS — R634 Abnormal weight loss: Secondary | ICD-10-CM | POA: Diagnosis not present

## 2020-07-20 DIAGNOSIS — R5383 Other fatigue: Secondary | ICD-10-CM | POA: Diagnosis not present

## 2020-07-20 DIAGNOSIS — R630 Anorexia: Secondary | ICD-10-CM | POA: Diagnosis not present

## 2020-07-20 DIAGNOSIS — Z5111 Encounter for antineoplastic chemotherapy: Secondary | ICD-10-CM | POA: Diagnosis not present

## 2020-07-20 DIAGNOSIS — Z79899 Other long term (current) drug therapy: Secondary | ICD-10-CM | POA: Diagnosis not present

## 2020-07-20 LAB — CMP (CANCER CENTER ONLY)
ALT: 40 U/L (ref 0–44)
AST: 32 U/L (ref 15–41)
Albumin: 2.4 g/dL — ABNORMAL LOW (ref 3.5–5.0)
Alkaline Phosphatase: 78 U/L (ref 38–126)
Anion gap: 15 (ref 5–15)
BUN: 20 mg/dL (ref 8–23)
CO2: 22 mmol/L (ref 22–32)
Calcium: 9.4 mg/dL (ref 8.9–10.3)
Chloride: 103 mmol/L (ref 98–111)
Creatinine: 1.28 mg/dL — ABNORMAL HIGH (ref 0.61–1.24)
GFR, Estimated: >60 mL/min (ref >60)
Glucose, Bld: 162 mg/dL — ABNORMAL HIGH (ref 70–99)
Potassium: 4.2 mmol/L (ref 3.5–5.1)
Sodium: 140 mmol/L (ref 135–145)
Total Bilirubin: 0.6 mg/dL (ref 0.3–1.2)
Total Protein: 6.5 g/dL (ref 6.5–8.1)

## 2020-07-20 LAB — CBC WITH DIFFERENTIAL (CANCER CENTER ONLY)
Abs Immature Granulocytes: 0.1 10*3/uL — ABNORMAL HIGH (ref 0.00–0.07)
Basophils Absolute: 0 10*3/uL (ref 0.0–0.1)
Basophils Relative: 0 %
Eosinophils Absolute: 0 10*3/uL (ref 0.0–0.5)
Eosinophils Relative: 0 %
HCT: 29 % — ABNORMAL LOW (ref 39.0–52.0)
Hemoglobin: 9.1 g/dL — ABNORMAL LOW (ref 13.0–17.0)
Immature Granulocytes: 1 %
Lymphocytes Relative: 7 %
Lymphs Abs: 0.7 10*3/uL (ref 0.7–4.0)
MCH: 31.6 pg (ref 26.0–34.0)
MCHC: 31.4 g/dL (ref 30.0–36.0)
MCV: 100.7 fL — ABNORMAL HIGH (ref 80.0–100.0)
Monocytes Absolute: 0.7 10*3/uL (ref 0.1–1.0)
Monocytes Relative: 6 %
Neutro Abs: 8.6 10*3/uL — ABNORMAL HIGH (ref 1.7–7.7)
Neutrophils Relative %: 86 %
Platelet Count: 134 10*3/uL — ABNORMAL LOW (ref 150–400)
RBC: 2.88 MIL/uL — ABNORMAL LOW (ref 4.22–5.81)
RDW: 15.7 % — ABNORMAL HIGH (ref 11.5–15.5)
WBC Count: 10.1 10*3/uL (ref 4.0–10.5)
nRBC: 0 % (ref 0.0–0.2)

## 2020-07-20 MED ORDER — PROCHLORPERAZINE MALEATE 10 MG PO TABS
10.0000 mg | ORAL_TABLET | Freq: Once | ORAL | Status: AC
  Administered 2020-07-20: 10 mg via ORAL

## 2020-07-20 MED ORDER — HYDROCODONE-HOMATROPINE 5-1.5 MG/5ML PO SYRP: 5.0000 mL | ORAL_SOLUTION | Freq: Four times a day (QID) | ORAL | 0 refills | Status: DC | PRN

## 2020-07-20 MED ORDER — SODIUM CHLORIDE 0.9 % IV SOLN
Freq: Once | INTRAVENOUS | Status: AC
  Filled 2020-07-20: qty 250

## 2020-07-20 MED ORDER — SODIUM CHLORIDE 0.9 % IV SOLN
2000.0000 mg | Freq: Once | INTRAVENOUS | Status: AC
  Administered 2020-07-20: 2000 mg via INTRAVENOUS
  Filled 2020-07-20: qty 52.6

## 2020-07-20 MED ORDER — PREDNISONE 5 MG PO TABS: ORAL_TABLET | ORAL | 0 refills | Status: DC

## 2020-07-20 MED ORDER — SODIUM CHLORIDE 0.9 % IV SOLN
Freq: Once | INTRAVENOUS | Status: DC
  Filled 2020-07-20: qty 250

## 2020-07-20 MED ORDER — PROCHLORPERAZINE MALEATE 10 MG PO TABS
ORAL_TABLET | ORAL | Status: AC
  Filled 2020-07-20: qty 1

## 2020-07-20 MED ORDER — ALBUTEROL SULFATE HFA 108 (90 BASE) MCG/ACT IN AERS: 2.0000 | INHALATION_SPRAY | Freq: Four times a day (QID) | RESPIRATORY_TRACT | 2 refills | Status: AC | PRN

## 2020-07-20 NOTE — Patient Instructions (Signed)
[facility] °Discharge Instructions for Patients Receiving Chemotherapy ° °Today you received the following chemotherapy agents Gemzar ° °To help prevent nausea and vomiting after your treatment, we encourage you to take your nausea medication as directed. °  °If you develop nausea and vomiting that is not controlled by your nausea medication, call the clinic.  ° °BELOW ARE SYMPTOMS THAT SHOULD BE REPORTED IMMEDIATELY: °· *FEVER GREATER THAN 100.5 F °· *CHILLS WITH OR WITHOUT FEVER °· NAUSEA AND VOMITING THAT IS NOT CONTROLLED WITH YOUR NAUSEA MEDICATION °· *UNUSUAL SHORTNESS OF BREATH °· *UNUSUAL BRUISING OR BLEEDING °· TENDERNESS IN MOUTH AND THROAT WITH OR WITHOUT PRESENCE OF ULCERS °· *URINARY PROBLEMS °· *BOWEL PROBLEMS °· UNUSUAL RASH °Items with * indicate a potential emergency and should be followed up as soon as possible. ° °Feel free to call the clinic should you have any questions or concerns. The clinic phone number is [phone]. ° °Please show the CHEMO ALERT CARD at check-in to the Emergency Department and triage nurse. ° ° °

## 2020-07-21 MED ORDER — SITAGLIPTIN PHOSPHATE 100 MG PO TABS: 100.0000 mg | ORAL_TABLET | Freq: Every day | ORAL | 3 refills | Status: DC

## 2020-07-25 NOTE — Progress Notes (Signed)
[facility] Progress Note   Micheal Oliver [id number] Feb 09, 1951 70 y.o.  Micheal Oliver is managed by Dr. Fanny Bien. Micheal Oliver  Actively treated with chemotherapy/immunotherapy/hormonal therapy: yes  Current therapy: Gemzar   Last treated: 07/13/2020 (cycle #1, day #1)  Next scheduled appointment with provider: 08/03/2020  Assessment: Plan:    Malignant neoplasm of right upper lobe of lung (HCC)  Cough - Plan: HYDROcodone-homatropine (HYCODAN) 5-1.5 MG/5ML syrup   Stage IV (T3, N2, M1a) non-small cell lung cancer, adenocarcinoma: The patient is status post cycle #1, day #1 of Gemzar dosed on 07/13/2020. He is scheduled to be seen next on 08/03/2020.   Cough: Micheal Oliver was given prescriptions for a prednisone taper, albuterol inhaler, and Hycodan.  Please see After Visit Summary for patient specific instructions.  Future Appointments  Date Time Provider [place]  08/01/2020  7:15 AM [facility] DEVICE REMOTES [facility]  08/03/2020  8:00 AM [facility] LAB [facility] None  08/03/2020  8:30 AM Heilingoetter, Cassandra L, PA-C [facility] None  08/03/2020  9:15 AM [facility] INFUSION [facility] None  08/03/2020 10:30 AM Neff, Barbara L, RD [facility] None  08/10/2020  8:30 AM [facility] LAB [facility] None  08/10/2020  9:30 AM [facility] INFUSION [facility] None  08/22/2020  8:20 AM Copland, Gay Filler, MD [facility]  08/24/2020  8:15 AM [facility] LAB [facility] None  08/24/2020  8:45 AM Curt Bears, MD [facility] None  08/24/2020  9:30 AM [facility] INFUSION [facility] None  08/31/2020  8:15 AM [facility] LAB [facility] None  08/31/2020  9:15 AM [facility] INFUSION [facility] None  09/05/2020  7:15 AM [facility] DEVICE REMOTES [facility]  09/14/2020  8:45 AM [facility] LAB [facility] None  09/14/2020  9:15 AM Curt Bears, MD [facility] None  09/14/2020 10:00 AM [facility] INFUSION [facility] None   09/21/2020  8:30 AM [facility] LAB [facility] None  09/21/2020  9:30 AM [facility] INFUSION [facility] None  10/10/2020  7:15 AM [facility] DEVICE REMOTES [facility]  11/14/2020  7:15 AM [facility] DEVICE REMOTES [facility]  12/19/2020  7:15 AM [facility] DEVICE REMOTES [facility]  01/23/2021  7:15 AM [facility] DEVICE REMOTES [facility]  02/27/2021  7:15 AM [facility] DEVICE REMOTES [facility]    No orders of the defined types were placed in this encounter.      Subjective:   Patient ID:  Micheal Oliver is a 70 y.o. (DOB [birth year]) male.  Chief Complaint: No chief complaint on file.   HPI Micheal Oliver  is a 69 y.o. male with a diagnosis of a stage IV (T3, N2, M1a) non-small cell lung cancer, adenocarcinoma.  The patient is status post cycle #1, day #1 of Gemzar dosed on 07/13/2020. He is scheduled to be seen next on 08/03/2020.     Medications: I have reviewed the patient's current medications.  Allergies:  Allergies  Allergen Reactions  . Bee Venom     Lip swelling    Past Medical History:  Diagnosis Date  . Chicken pox   . DM2 (diabetes mellitus, type 2) ([place])   . History of kidney stones    passed several   . Hypertension   . Kidney stones    5-6 times  . Measles   . Mumps   . Stroke ([place])    x 2, last one was 09/18/2019- has some expressive aphasia    Past Surgical History:  Procedure Laterality Date  . COLONOSCOPY W/ POLYPECTOMY    . VIDEO BRONCHOSCOPY WITH ENDOBRONCHIAL ULTRASOUND N/A 09/30/2019   Procedure: VIDEO BRONCHOSCOPY  WITH ENDOBRONCHIAL ULTRASOUND;  Surgeon: Collene Gobble, MD;  Location: [place];  Service: Thoracic;  Laterality: N/A;  . WISDOM TOOTH EXTRACTION  1976-77    Family History  Problem Relation Age of Onset  . Alcohol abuse Father 77       Deceased  . Heart disease Father   . Lung disease Father   . Hypertension Father   . Stomach cancer Mother 62        Deceased  . Hypertension Paternal Grandmother   . Dementia Paternal Grandmother   . Stroke Paternal Grandmother   . Arthritis Maternal Grandmother   . Sudden death Paternal Grandfather   . Sudden death Maternal Grandfather   . Heart attack Paternal Aunt   . Other Maternal Aunt        Natural Causes  . Healthy Son   . Anxiety disorder Daughter     Social History   Socioeconomic History  . Marital status: Married    Spouse name: Micheal Oliver  . Number of children: 2  . Years of education: MBA  . Highest education level: Not on file  Occupational History    Comment: retired, Banker   Tobacco Use  . Smoking status: Never Smoker  . Smokeless tobacco: Never Used  Vaping Use  . Vaping Use: Never used  Substance and Sexual Activity  . Alcohol use: No  . Drug use: No  . Sexual activity: Yes  Other Topics Concern  . Not on file  Social History Narrative   Lives at home with wife   Caffeine use- 15-20oz per day   Right handed    Social Determinants of Health   Financial Resource Strain: Not on file  Food Insecurity: Not on file  Transportation Needs: Not on file  Physical Activity: Not on file  Stress: Not on file  Social Connections: Not on file  Intimate Partner Violence: Not on file    Past Medical History, Surgical history, Social history, and Family history were reviewed and updated as appropriate.   Please see review of systems for further details on the patient's review from today.   Review of Systems:  Review of Systems  Constitutional: Negative for chills, diaphoresis, fatigue and fever.  HENT: Negative for congestion, postnasal drip, rhinorrhea, sore throat, trouble swallowing and voice change.   Respiratory: Positive for cough. Negative for chest tightness, shortness of breath and wheezing.   Cardiovascular: Negative for chest pain and palpitations.  Gastrointestinal: Negative for abdominal pain, constipation, diarrhea, nausea and vomiting.   Musculoskeletal: Negative for back pain and myalgias.  Neurological: Negative for dizziness, light-headedness and headaches.    Objective:   Physical Exam:  There were no vitals taken for this visit. ECOG: 1  Physical Exam Constitutional:      General: He is not in acute distress.    Appearance: He is not diaphoretic.  HENT:     Head: Normocephalic and atraumatic.  Eyes:     General: No scleral icterus.       Right eye: No discharge.        Left eye: No discharge.     Conjunctiva/sclera: Conjunctivae normal.  Cardiovascular:     Rate and Rhythm: Normal rate and regular rhythm.     Heart sounds: Normal heart sounds. No murmur heard. No friction rub. No gallop.   Pulmonary:     Effort: Pulmonary effort is normal. No respiratory distress.     Breath sounds: Normal breath sounds. No wheezing or rales.  Skin:  General: Skin is warm and dry.     Findings: No erythema or rash.  Neurological:     Mental Status: He is alert.     Coordination: Coordination normal.     Gait: Gait normal.  Psychiatric:        Mood and Affect: Mood normal.        Behavior: Behavior normal.        Thought Content: Thought content normal.        Judgment: Judgment normal.     Lab Review:     Component Value Date/Time   NA 140 07/20/2020 1207   K 4.2 07/20/2020 1207   CL 103 07/20/2020 1207   CO2 22 07/20/2020 1207   GLUCOSE 162 (H) 07/20/2020 1207   BUN 20 07/20/2020 1207   CREATININE 1.28 (H) 07/20/2020 1207   CREATININE 1.13 10/16/2013 1118   CALCIUM 9.4 07/20/2020 1207   PROT 6.5 07/20/2020 1207   ALBUMIN 2.4 (L) 07/20/2020 1207   AST 32 07/20/2020 1207   ALT 40 07/20/2020 1207   ALKPHOS 78 07/20/2020 1207   BILITOT 0.6 07/20/2020 1207   GFRNONAA >60 07/20/2020 1207   GFRAA >60 12/30/2019 0844       Component Value Date/Time   WBC 10.1 07/20/2020 1207   WBC 10.6 (H) 09/24/2019 1203   RBC 2.88 (L) 07/20/2020 1207   HGB 9.1 (L) 07/20/2020 1207   HCT 29.0 (L) 07/20/2020  1207   PLT 134 (L) 07/20/2020 1207   MCV 100.7 (H) 07/20/2020 1207   MCH 31.6 07/20/2020 1207   MCHC 31.4 07/20/2020 1207   RDW 15.7 (H) 07/20/2020 1207   LYMPHSABS 0.7 07/20/2020 1207   MONOABS 0.7 07/20/2020 1207   EOSABS 0.0 07/20/2020 1207   BASOSABS 0.0 07/20/2020 1207   -------------------------------  Imaging from last 24 hours (if applicable):  Radiology interpretation: CT Chest W Contrast  Result Date: 07/05/2020 CLINICAL DATA:  Primary Cancer Type: Lung Imaging Indication: Assess response to therapy Interval therapy since last imaging? Yes Initial Cancer Diagnosis Date: 09/30/2019; Established by: Biopsy-proven Detailed Pathology: Stage IV non-small cell lung cancer, adenocarcinoma. Primary Tumor location:  Right upper lobe. Left lower lobe nodule. Surgeries: No. Chemotherapy: Yes; Ongoing? Yes; Most recent administration: 06/16/2020 Immunotherapy?  Yes; Type: Keytruda; Ongoing? No Radiation therapy? Yes; Date Range: 11/03/2019 - 11/23/2019; Target: Right upper lung EXAM: CT CHEST, ABDOMEN, AND PELVIS WITH CONTRAST TECHNIQUE: Multidetector CT imaging of the chest, abdomen and pelvis was performed following the standard protocol during bolus administration of intravenous contrast. CONTRAST:  160mL OMNIPAQUE IOHEXOL 300 MG/ML  SOLN COMPARISON:  Most recent CT chest, abdomen and pelvis 05/10/2020. 10/26/2019 PET-CT. FINDINGS: CT CHEST FINDINGS Cardiovascular: No significant vascular findings. Normal heart size. No pericardial effusion. Mediastinum/Nodes: No enlarged mediastinal, hilar, or axillary lymph nodes. Thyroid gland, trachea, and esophagus demonstrate no significant findings. Lungs/Pleura: Moderate right pleural effusion associated atelectasis or consolidation, similar to prior examination. Interval increase in fibrotic consolidation of the superior segment right lower lobe and paramedian upper lobes bilaterally (series 7, image 79, 64). Paramedian mass of the right upper lobe is not  significantly changed, measuring approximately 4.2 x 2.5 cm (series 2, image 23). Dominant component of consolidation in the superior segment right lower lobe is not significantly changed in size, measuring approximately 5.5 x 3.2 cm (series 7, image 83) numerous small pulmonary nodules are generally increased in size, a spiculated nodule of the left lung base measuring 1.3 x 1.2 cm, previously 1.1 x 0.9 cm (series 7,  image 104) and a spiculated nodule of the right lung base measuring 0.9 x 0.8 cm, previously 0.7 x 0.6 cm (series 7, image 106). Musculoskeletal: No chest wall mass or suspicious bone lesions identified. CT ABDOMEN PELVIS FINDINGS Hepatobiliary: No solid liver abnormality is seen. No gallstones, gallbladder wall thickening, or biliary dilatation. Pancreas: Unremarkable. No pancreatic ductal dilatation or surrounding inflammatory changes. Spleen: Normal in size without significant abnormality. Adrenals/Urinary Tract: Adrenal glands are unremarkable. Punctuate bilateral nonobstructive calculi and superior poles of the kidneys. No hydronephrosis. Calculus in the dependent urinary bladder measuring 1.7 cm. Stomach/Bowel: Stomach is within normal limits. Appendix appears normal. No evidence of bowel wall thickening, distention, or inflammatory changes. Pancolonic diverticulosis, severe in the sigmoid. Vascular/Lymphatic: No significant vascular findings are present. No enlarged abdominal or pelvic lymph nodes. Reproductive: No mass or other abnormality. Other: No abdominal wall hernia or abnormality. No abdominopelvic ascites. Musculoskeletal: No acute or significant osseous findings. IMPRESSION: 1. Numerous bilateral pulmonary nodules continue to enlarge, consistent with worsened pulmonary metastatic disease. 2. Paramedian mass of the right upper lobe is not significantly changed. Dominant component of fibrotic consolidation in the superior segment right lower lobe is not significantly changed. 3. Interval  increase in fibrotic consolidation of the superior segment right lower lobe and paramedian upper lobes bilaterally, consistent with ongoing development of radiation fibrosis. 4. Moderate right pleural effusion associated atelectasis or consolidation, similar to prior examination. 5. No evidence of metastatic disease in the abdomen or pelvis. 6. Punctuate bilateral nonobstructive nephrolithiasis. 7. Redemonstrated calculus in the dependent urinary bladder measuring 1.7 cm. Electronically Signed   By: Eddie Candle M.D.   On: 07/05/2020 11:15   MR BRAIN W WO CONTRAST  Result Date: 07/15/2020 CLINICAL DATA:  Lung cancer staging.  Right eyelid drooping EXAM: MRI HEAD WITHOUT AND WITH CONTRAST TECHNIQUE: Multiplanar, multiecho pulse sequences of the brain and surrounding structures were obtained without and with intravenous contrast. CONTRAST:  70mL GADAVIST GADOBUTROL 1 MMOL/ML IV SOLN COMPARISON:  09/18/2019 FINDINGS: Brain: Ovoid area of weakly restricted diffusion in the right centrum semiovale with low-grade enhancement, most consistent with subacute infarct. Expected evolution of left cerebral infarct centered at the posterior temporal to parietal cortex. Small remote infarcts in the right occipital cortex, left temporal occipital cortex, and right cerebellum. Concerning eyelid drooping-no visible skull base mass, cisternal lesion, cavernous sinus lesion, or pontine disease. No hydrocephalus or collection Vascular: Normal flow voids and vascular enhancement Skull and upper cervical spine: No focal marrow lesion Sinuses/Orbits: New bilateral sinusitis with mucosal thickening and fluid in the left more than right maxillary sinuses. The bilateral ethmoids and inferior frontal sinuses are also affected. Bilateral mastoid opacification which is new on the left and progressed on the right. IMPRESSION: 1. 1 cm abnormality in the right centrum semiovale most consistent with subacute infarct. In the setting of active  malignancy recommend follow-up in 2-3 months to exclude solitary metastasis. 2. Prominent bilateral sinusitis since 2021. Also new/progressed is mastoid opacification. 3. Chronic infra and supratentorial infarcts which were also seen in 2021. Electronically Signed   By: Monte Fantasia M.D.   On: 07/15/2020 20:53   CT Abdomen Pelvis W Contrast  Result Date: 07/05/2020 CLINICAL DATA:  Primary Cancer Type: Lung Imaging Indication: Assess response to therapy Interval therapy since last imaging? Yes Initial Cancer Diagnosis Date: 09/30/2019; Established by: Biopsy-proven Detailed Pathology: Stage IV non-small cell lung cancer, adenocarcinoma. Primary Tumor location:  Right upper lobe. Left lower lobe nodule. Surgeries: No. Chemotherapy: Yes; Ongoing? Yes; Most  recent administration: 06/16/2020 Immunotherapy?  Yes; Type: Keytruda; Ongoing? No Radiation therapy? Yes; Date Range: 11/03/2019 - 11/23/2019; Target: Right upper lung EXAM: CT CHEST, ABDOMEN, AND PELVIS WITH CONTRAST TECHNIQUE: Multidetector CT imaging of the chest, abdomen and pelvis was performed following the standard protocol during bolus administration of intravenous contrast. CONTRAST:  110mL OMNIPAQUE IOHEXOL 300 MG/ML  SOLN COMPARISON:  Most recent CT chest, abdomen and pelvis 05/10/2020. 10/26/2019 PET-CT. FINDINGS: CT CHEST FINDINGS Cardiovascular: No significant vascular findings. Normal heart size. No pericardial effusion. Mediastinum/Nodes: No enlarged mediastinal, hilar, or axillary lymph nodes. Thyroid gland, trachea, and esophagus demonstrate no significant findings. Lungs/Pleura: Moderate right pleural effusion associated atelectasis or consolidation, similar to prior examination. Interval increase in fibrotic consolidation of the superior segment right lower lobe and paramedian upper lobes bilaterally (series 7, image 79, 64). Paramedian mass of the right upper lobe is not significantly changed, measuring approximately 4.2 x 2.5 cm (series  2, image 23). Dominant component of consolidation in the superior segment right lower lobe is not significantly changed in size, measuring approximately 5.5 x 3.2 cm (series 7, image 83) numerous small pulmonary nodules are generally increased in size, a spiculated nodule of the left lung base measuring 1.3 x 1.2 cm, previously 1.1 x 0.9 cm (series 7, image 104) and a spiculated nodule of the right lung base measuring 0.9 x 0.8 cm, previously 0.7 x 0.6 cm (series 7, image 106). Musculoskeletal: No chest wall mass or suspicious bone lesions identified. CT ABDOMEN PELVIS FINDINGS Hepatobiliary: No solid liver abnormality is seen. No gallstones, gallbladder wall thickening, or biliary dilatation. Pancreas: Unremarkable. No pancreatic ductal dilatation or surrounding inflammatory changes. Spleen: Normal in size without significant abnormality. Adrenals/Urinary Tract: Adrenal glands are unremarkable. Punctuate bilateral nonobstructive calculi and superior poles of the kidneys. No hydronephrosis. Calculus in the dependent urinary bladder measuring 1.7 cm. Stomach/Bowel: Stomach is within normal limits. Appendix appears normal. No evidence of bowel wall thickening, distention, or inflammatory changes. Pancolonic diverticulosis, severe in the sigmoid. Vascular/Lymphatic: No significant vascular findings are present. No enlarged abdominal or pelvic lymph nodes. Reproductive: No mass or other abnormality. Other: No abdominal wall hernia or abnormality. No abdominopelvic ascites. Musculoskeletal: No acute or significant osseous findings. IMPRESSION: 1. Numerous bilateral pulmonary nodules continue to enlarge, consistent with worsened pulmonary metastatic disease. 2. Paramedian mass of the right upper lobe is not significantly changed. Dominant component of fibrotic consolidation in the superior segment right lower lobe is not significantly changed. 3. Interval increase in fibrotic consolidation of the superior segment right  lower lobe and paramedian upper lobes bilaterally, consistent with ongoing development of radiation fibrosis. 4. Moderate right pleural effusion associated atelectasis or consolidation, similar to prior examination. 5. No evidence of metastatic disease in the abdomen or pelvis. 6. Punctuate bilateral nonobstructive nephrolithiasis. 7. Redemonstrated calculus in the dependent urinary bladder measuring 1.7 cm. Electronically Signed   By: Eddie Candle M.D.   On: 07/05/2020 11:15

## 2020-07-27 ENCOUNTER — Encounter: Payer: Medicare HMO | Admitting: Nutrition

## 2020-07-27 ENCOUNTER — Other Ambulatory Visit: Payer: Medicare HMO

## 2020-07-27 ENCOUNTER — Ambulatory Visit: Payer: Medicare HMO

## 2020-07-27 ENCOUNTER — Ambulatory Visit: Payer: Medicare HMO | Admitting: Physician Assistant

## 2020-07-29 ENCOUNTER — Ambulatory Visit: Payer: Medicare HMO

## 2020-07-31 NOTE — Progress Notes (Signed)
Micheal Oliver OFFICE PROGRESS NOTE  Micheal Oliver, Micheal Filler, MD [address] [place] [address]  DIAGNOSIS: Stage IV(T3, N2, M1a) non-small cell lung cancer, adenocarcinoma presented with large right upper lobe lung mass extending to the right suprahilar region in addition to right hilar and mediastinal lymphadenopathy with obstruction of the associated upper lobe bronchusandbilateral subcentimeter pulmonary nodulesdiagnosed in July 2021.  Biomarker Findings Microsatellite status - MS-Stable Tumor Mutational Burden - 3 Muts/Mb Genomic Findings For a complete list of the genes assayed, please refer to the Appendix. ERBB2 [id number], amplification - equivocal? HGF amplification - equivocal? MTAP loss exons 5-8 MYC amplification - equivocal? CDKN2A/B CDKN2B loss, CDKN2A loss CUL3 Y26f*19 RPTOR amplification TP53 [id number] Disease relevant genes with no reportable alterations: ALK, BRAF, EGFR, KRAS, MET, RET, ROS1  PDL1 Expression: Negative  Prior Therapy: 1) Palliative radiotherapy to the obstructive lung mass under the care of Dr. MoLisbeth RenshawThe last treatment was on8/23/21 2) Systemic chemotherapy with carboplatin for AUC of 5, Alimta 500 mg/M2 and Keytruda 200 mg IV every 3 weeks.  First dose November 04, 2019.  Status post 9 cycles.  Starting from cycle #5 he was on maintenance treatment with Alimta and Keytruda every 3 weeks. 3) Second line systemic chemotherapy with docetaxel 75 mg/M2 and Cyramza 10 mg/KG every weeks with Neulasta support.  First dose May 18, 2020.  Status post 2 cycles.  Last dose was given June 17, 2020 discontinued secondary to disease progression.  CURRENT THERAPY: Third line systemic chemotherapy with single agent gemcitabine 1000 mg/M2 on days 1 and 8 every 3 weeks.  First dose July 13, 2020. Status post 1 cycle.   INTERVAL HISTORY: Micheal Koelzer89.o. male returns to the clinic today for a follow-up visit.  The  patient recently had evidence of disease progression and his treatment with docetaxel and Cyramza was discontinued.  He underwent his first cycle of third line chemotherapy with gemcitabine on 07/13/2020 and tolerated it well except for fatigue. He states last time when he had a blood transfusion it helped his symptoms. He denies bleeding or bruising. He was also seen in the symptom management clinic on 07/20/2020 for the chief complaint of a cough.  The patient was given a prednisone taper, albuterol, and Hycodan.  His cough is persistent but states it is not as "violent" as it was before and "clears easily".  The patient's most recent restaging CT scan did show a pleural effusion.  At that time, the patient decided to continue to monitor. I offered again a therapeutic thoracentesis but he wishes to continue on observation at this time.   Otherwise the patient denies any recent fever, chills, or night sweats.  The patient is being followed by nutrition for his weight loss and is scheduled to see them today.  His breathing is stable. He still has shortness of breath with walking ~100 yards but states it is not as bad as "it used to be".  He denies any chest pain or hemoptysis.  He denies any nausea, vomiting, diarrhea, or constipation.  He denies any headache or visual changes. Dr. MoJulien Oliver ordered a brain MRI due to right eye ptosis which was negative for metastatic disease but noted some prior infarcts. There was one area that warranted further monitoring and recommended follow up MRI in 2-3 months. The patient is here today for evaluation before starting cycle #2.     MEDICAL HISTORY: Past Medical History:  Diagnosis Date  .  Chicken pox   . DM2 (diabetes mellitus, type 2) ([place])   . History of kidney stones    passed several   . Hypertension   . Kidney stones    5-6 times  . Measles   . Mumps   . Stroke ([place])    x 2, last one was 09/18/2019- has some expressive aphasia    ALLERGIES:  is  allergic to bee venom.  MEDICATIONS:  Current Outpatient Medications  Medication Sig Dispense Refill  . acetaminophen (TYLENOL) 500 MG tablet Take 500 mg by mouth every 6 (six) hours as needed.     Marland Kitchen albuterol (VENTOLIN HFA) 108 (90 Base) MCG/ACT inhaler Inhale 2 puffs into the lungs every 6 (six) hours as needed for wheezing or shortness of breath. 8 g 2  . apixaban (ELIQUIS) 5 MG TABS tablet Take 1 tablet (5 mg total) by mouth 2 (two) times daily. 60 tablet 11  . atorvastatin (LIPITOR) 40 MG tablet Take 1 tablet (40 mg total) by mouth daily at 6 PM. 90 tablet 3  . folic acid (FOLVITE) 1 MG tablet Take 1 tablet (1 mg total) by mouth daily. 90 tablet 3  . gabapentin (NEURONTIN) 300 MG capsule Take 1 capsule (300 mg total) by mouth at bedtime. 90 capsule 3  . HYDROcodone-homatropine (HYCODAN) 5-1.5 MG/5ML syrup Take 5 mLs by mouth every 6 (six) hours as needed for cough. 120 mL 0  . metFORMIN (GLUCOPHAGE) 500 MG tablet TAKE TWO TABLETS BY MOUTH TWICE A DAY WITH MEAL(S) 360 tablet 3  . Omega-3 Fatty Acids (FISH OIL) 1200 MG CAPS Take by mouth daily.    . predniSONE (DELTASONE) 5 MG tablet 6 tab x 2 days, 5 tab x 2 days, 4 tab x 2 days, 3 tab x 2 day2, 2 tab x 2 days, 1 tab x 2 days, stop 42 tablet 0  . prochlorperazine (COMPAZINE) 10 MG tablet Take 1 tablet (10 mg total) by mouth every 6 (six) hours as needed. 30 tablet 2  . sitaGLIPtin (JANUVIA) 100 MG tablet Take 1 tablet (100 mg total) by mouth daily. 90 tablet 3   No current facility-administered medications for this visit.   Facility-Administered Medications Ordered in Other Visits  Medication Dose Route Frequency Provider Last Rate Last Admin  . 0.9 %  sodium chloride infusion   Intravenous Once Curt Bears, MD      . gemcitabine (GEMZAR) 2,052 mg in sodium chloride 0.9 % 250 mL chemo infusion  1,000 mg/m2 (Treatment Plan Recorded) Intravenous Once Curt Bears, MD      . prochlorperazine (COMPAZINE) tablet 10 mg  10 mg Oral Once  Curt Bears, MD        SURGICAL HISTORY:  Past Surgical History:  Procedure Laterality Date  . COLONOSCOPY W/ POLYPECTOMY    . VIDEO BRONCHOSCOPY WITH ENDOBRONCHIAL ULTRASOUND N/A 09/30/2019   Procedure: VIDEO BRONCHOSCOPY WITH ENDOBRONCHIAL ULTRASOUND;  Surgeon: Collene Gobble, MD;  Location: [facility];  Service: Thoracic;  Laterality: N/A;  . WISDOM TOOTH EXTRACTION  1976-77    REVIEW OF SYSTEMS:   Review of Systems  Constitutional: Positive for fatigue and generalized weakness. Negative for appetite change, chills, fever and unexpected weight change.  HENT:  Negative for mouth sores, nosebleeds, sore throat and trouble swallowing.   Eyes: Positive for right eye ptosis. Negative for eye problems and icterus.  Respiratory: Positive for cough. Positive for shortness of breath with exertion. Negative for hemoptysis and wheezing.   Cardiovascular: Negative for chest pain  and leg swelling.  Gastrointestinal: Negative for abdominal pain, constipation, diarrhea, nausea and vomiting.  Genitourinary: Negative for bladder incontinence, difficulty urinating, dysuria, frequency and hematuria.   Musculoskeletal: Negative for back pain, gait problem, neck pain and neck stiffness.  Skin: Negative for itching and rash.  Neurological: Negative for dizziness, extremity weakness, gait problem, headaches, light-headedness and seizures.  Hematological: Negative for adenopathy. Does not bruise/bleed easily.  Psychiatric/Behavioral: Negative for confusion, depression and sleep disturbance. The patient is not nervous/anxious.     PHYSICAL EXAMINATION:  Blood pressure 125/89, pulse (!) 102, temperature (!) 97.5 F (36.4 C), temperature source Oral, resp. rate 19, weight 182 lb 11.2 oz (82.9 kg), SpO2 97 %.  ECOG PERFORMANCE STATUS: 1 - Symptomatic but completely ambulatory  Physical Exam  Constitutional: Oriented to person, place, and time and thin appearing male and in no distress.  HENT:  Head:  Normocephalic and atraumatic.  Mouth/Throat: Oropharynx is clear and moist. No oropharyngeal exudate.  Eyes: Right eye ptosis. Conjunctivae are normal. Right eye exhibits no discharge. Left eye exhibits no discharge. No scleral icterus.  Neck: Normal range of motion. Neck supple.  Cardiovascular: Normal rate, regular rhythm, normal heart sounds and intact distal pulses.   Pulmonary/Chest: Effort normal and breath sounds normal. Decreased breath sounds in base of right lung. No respiratory distress. No wheezes. No rales.  Abdominal: Soft. Bowel sounds are normal. Exhibits no distension and no mass. There is no tenderness.  Musculoskeletal: Normal range of motion. Exhibits no edema.  Lymphadenopathy:    No cervical adenopathy.  Neurological: Alert and oriented to person, place, and time. Exhibits normal muscle tone. Gait normal. Coordination normal.  Skin: Skin is warm and dry. No rash noted. Not diaphoretic. No erythema. No pallor.  Psychiatric: Mood, memory and judgment normal.  Vitals reviewed.  LABORATORY DATA: Lab Results  Component Value Date   WBC 7.8 08/03/2020   HGB 8.2 (L) 08/03/2020   HCT 26.7 (L) 08/03/2020   MCV 101.9 (H) 08/03/2020   PLT 272 08/03/2020      Chemistry      Component Value Date/Time   NA 141 08/03/2020 0803   K 4.3 08/03/2020 0803   CL 105 08/03/2020 0803   CO2 27 08/03/2020 0803   BUN 16 08/03/2020 0803   CREATININE 1.03 08/03/2020 0803   CREATININE 1.13 10/16/2013 1118      Component Value Date/Time   CALCIUM 9.0 08/03/2020 0803   ALKPHOS 64 08/03/2020 0803   AST 16 08/03/2020 0803   ALT 16 08/03/2020 0803   BILITOT 0.4 08/03/2020 0803       RADIOGRAPHIC STUDIES:  CT Chest W Contrast  Result Date: 07/05/2020 CLINICAL DATA:  Primary Cancer Type: Lung Imaging Indication: Assess response to therapy Interval therapy since last imaging? Yes Initial Cancer Diagnosis Date: 09/30/2019; Established by: Biopsy-proven Detailed Pathology: Stage IV  non-small cell lung cancer, adenocarcinoma. Primary Tumor location:  Right upper lobe. Left lower lobe nodule. Surgeries: No. Chemotherapy: Yes; Ongoing? Yes; Most recent administration: 06/16/2020 Immunotherapy?  Yes; Type: Keytruda; Ongoing? No Radiation therapy? Yes; Date Range: 11/03/2019 - 11/23/2019; Target: Right upper lung EXAM: CT CHEST, ABDOMEN, AND PELVIS WITH CONTRAST TECHNIQUE: Multidetector CT imaging of the chest, abdomen and pelvis was performed following the standard protocol during bolus administration of intravenous contrast. CONTRAST:  165m OMNIPAQUE IOHEXOL 300 MG/ML  SOLN COMPARISON:  Most recent CT chest, abdomen and pelvis 05/10/2020. 10/26/2019 PET-CT. FINDINGS: CT CHEST FINDINGS Cardiovascular: No significant vascular findings. Normal heart size. No pericardial effusion.  Mediastinum/Nodes: No enlarged mediastinal, hilar, or axillary lymph nodes. Thyroid gland, trachea, and esophagus demonstrate no significant findings. Lungs/Pleura: Moderate right pleural effusion associated atelectasis or consolidation, similar to prior examination. Interval increase in fibrotic consolidation of the superior segment right lower lobe and paramedian upper lobes bilaterally (series 7, image 79, 64). Paramedian mass of the right upper lobe is not significantly changed, measuring approximately 4.2 x 2.5 cm (series 2, image 23). Dominant component of consolidation in the superior segment right lower lobe is not significantly changed in size, measuring approximately 5.5 x 3.2 cm (series 7, image 83) numerous small pulmonary nodules are generally increased in size, a spiculated nodule of the left lung base measuring 1.3 x 1.2 cm, previously 1.1 x 0.9 cm (series 7, image 104) and a spiculated nodule of the right lung base measuring 0.9 x 0.8 cm, previously 0.7 x 0.6 cm (series 7, image 106). Musculoskeletal: No chest wall mass or suspicious bone lesions identified. CT ABDOMEN PELVIS FINDINGS Hepatobiliary: No  solid liver abnormality is seen. No gallstones, gallbladder wall thickening, or biliary dilatation. Pancreas: Unremarkable. No pancreatic ductal dilatation or surrounding inflammatory changes. Spleen: Normal in size without significant abnormality. Adrenals/Urinary Tract: Adrenal glands are unremarkable. Punctuate bilateral nonobstructive calculi and superior poles of the kidneys. No hydronephrosis. Calculus in the dependent urinary bladder measuring 1.7 cm. Stomach/Bowel: Stomach is within normal limits. Appendix appears normal. No evidence of bowel wall thickening, distention, or inflammatory changes. Pancolonic diverticulosis, severe in the sigmoid. Vascular/Lymphatic: No significant vascular findings are present. No enlarged abdominal or pelvic lymph nodes. Reproductive: No mass or other abnormality. Other: No abdominal wall hernia or abnormality. No abdominopelvic ascites. Musculoskeletal: No acute or significant osseous findings. IMPRESSION: 1. Numerous bilateral pulmonary nodules continue to enlarge, consistent with worsened pulmonary metastatic disease. 2. Paramedian mass of the right upper lobe is not significantly changed. Dominant component of fibrotic consolidation in the superior segment right lower lobe is not significantly changed. 3. Interval increase in fibrotic consolidation of the superior segment right lower lobe and paramedian upper lobes bilaterally, consistent with ongoing development of radiation fibrosis. 4. Moderate right pleural effusion associated atelectasis or consolidation, similar to prior examination. 5. No evidence of metastatic disease in the abdomen or pelvis. 6. Punctuate bilateral nonobstructive nephrolithiasis. 7. Redemonstrated calculus in the dependent urinary bladder measuring 1.7 cm. Electronically Signed   By: Eddie Candle M.D.   On: 07/05/2020 11:15   MR BRAIN W WO CONTRAST  Result Date: 07/15/2020 CLINICAL DATA:  Lung cancer staging.  Right eyelid drooping EXAM: MRI  HEAD WITHOUT AND WITH CONTRAST TECHNIQUE: Multiplanar, multiecho pulse sequences of the brain and surrounding structures were obtained without and with intravenous contrast. CONTRAST:  1m GADAVIST GADOBUTROL 1 MMOL/ML IV SOLN COMPARISON:  09/18/2019 FINDINGS: Brain: Ovoid area of weakly restricted diffusion in the right centrum semiovale with low-grade enhancement, most consistent with subacute infarct. Expected evolution of left cerebral infarct centered at the posterior temporal to parietal cortex. Small remote infarcts in the right occipital cortex, left temporal occipital cortex, and right cerebellum. Concerning eyelid drooping-no visible skull base mass, cisternal lesion, cavernous sinus lesion, or pontine disease. No hydrocephalus or collection Vascular: Normal flow voids and vascular enhancement Skull and upper cervical spine: No focal marrow lesion Sinuses/Orbits: New bilateral sinusitis with mucosal thickening and fluid in the left more than right maxillary sinuses. The bilateral ethmoids and inferior frontal sinuses are also affected. Bilateral mastoid opacification which is new on the left and progressed on the right. IMPRESSION: 1.  1 cm abnormality in the right centrum semiovale most consistent with subacute infarct. In the setting of active malignancy recommend follow-up in 2-3 months to exclude solitary metastasis. 2. Prominent bilateral sinusitis since 2021. Also new/progressed is mastoid opacification. 3. Chronic infra and supratentorial infarcts which were also seen in 2021. Electronically Signed   By: Monte Fantasia M.D.   On: 07/15/2020 20:53   CT Abdomen Pelvis W Contrast  Result Date: 07/05/2020 CLINICAL DATA:  Primary Cancer Type: Lung Imaging Indication: Assess response to therapy Interval therapy since last imaging? Yes Initial Cancer Diagnosis Date: 09/30/2019; Established by: Biopsy-proven Detailed Pathology: Stage IV non-small cell lung cancer, adenocarcinoma. Primary Tumor location:   Right upper lobe. Left lower lobe nodule. Surgeries: No. Chemotherapy: Yes; Ongoing? Yes; Most recent administration: 06/16/2020 Immunotherapy?  Yes; Type: Keytruda; Ongoing? No Radiation therapy? Yes; Date Range: 11/03/2019 - 11/23/2019; Target: Right upper lung EXAM: CT CHEST, ABDOMEN, AND PELVIS WITH CONTRAST TECHNIQUE: Multidetector CT imaging of the chest, abdomen and pelvis was performed following the standard protocol during bolus administration of intravenous contrast. CONTRAST:  168m OMNIPAQUE IOHEXOL 300 MG/ML  SOLN COMPARISON:  Most recent CT chest, abdomen and pelvis 05/10/2020. 10/26/2019 PET-CT. FINDINGS: CT CHEST FINDINGS Cardiovascular: No significant vascular findings. Normal heart size. No pericardial effusion. Mediastinum/Nodes: No enlarged mediastinal, hilar, or axillary lymph nodes. Thyroid gland, trachea, and esophagus demonstrate no significant findings. Lungs/Pleura: Moderate right pleural effusion associated atelectasis or consolidation, similar to prior examination. Interval increase in fibrotic consolidation of the superior segment right lower lobe and paramedian upper lobes bilaterally (series 7, image 79, 64). Paramedian mass of the right upper lobe is not significantly changed, measuring approximately 4.2 x 2.5 cm (series 2, image 23). Dominant component of consolidation in the superior segment right lower lobe is not significantly changed in size, measuring approximately 5.5 x 3.2 cm (series 7, image 83) numerous small pulmonary nodules are generally increased in size, a spiculated nodule of the left lung base measuring 1.3 x 1.2 cm, previously 1.1 x 0.9 cm (series 7, image 104) and a spiculated nodule of the right lung base measuring 0.9 x 0.8 cm, previously 0.7 x 0.6 cm (series 7, image 106). Musculoskeletal: No chest wall mass or suspicious bone lesions identified. CT ABDOMEN PELVIS FINDINGS Hepatobiliary: No solid liver abnormality is seen. No gallstones, gallbladder wall  thickening, or biliary dilatation. Pancreas: Unremarkable. No pancreatic ductal dilatation or surrounding inflammatory changes. Spleen: Normal in size without significant abnormality. Adrenals/Urinary Tract: Adrenal glands are unremarkable. Punctuate bilateral nonobstructive calculi and superior poles of the kidneys. No hydronephrosis. Calculus in the dependent urinary bladder measuring 1.7 cm. Stomach/Bowel: Stomach is within normal limits. Appendix appears normal. No evidence of bowel wall thickening, distention, or inflammatory changes. Pancolonic diverticulosis, severe in the sigmoid. Vascular/Lymphatic: No significant vascular findings are present. No enlarged abdominal or pelvic lymph nodes. Reproductive: No mass or other abnormality. Other: No abdominal wall hernia or abnormality. No abdominopelvic ascites. Musculoskeletal: No acute or significant osseous findings. IMPRESSION: 1. Numerous bilateral pulmonary nodules continue to enlarge, consistent with worsened pulmonary metastatic disease. 2. Paramedian mass of the right upper lobe is not significantly changed. Dominant component of fibrotic consolidation in the superior segment right lower lobe is not significantly changed. 3. Interval increase in fibrotic consolidation of the superior segment right lower lobe and paramedian upper lobes bilaterally, consistent with ongoing development of radiation fibrosis. 4. Moderate right pleural effusion associated atelectasis or consolidation, similar to prior examination. 5. No evidence of metastatic disease in the abdomen  or pelvis. 6. Punctuate bilateral nonobstructive nephrolithiasis. 7. Redemonstrated calculus in the dependent urinary bladder measuring 1.7 cm. Electronically Signed   By: Eddie Candle M.D.   On: 07/05/2020 11:15     ASSESSMENT/PLAN:  This is a very pleasant 7 year oldCaucasianmale diagnosed with a stage IV (T3, N2, M1 a) non-small cell lung cancer, adenocarcinoma presented with large right  upper lobe lung mass with extension to the right suprahilar region as well as right hilar and mediastinal lymphadenopathy with obstruction of the right upper lobe and bilateral pulmonary nodules diagnosed in July 2021. Negative PDL1 expression. He has no actionable mutations  The patientcompletedpalliative radiotherapy to the obstructive lung mass under the care of Dr. Lisbeth Oliver. The last treatmentwasin August 2021  The patient underwent systemic chemotherapy with carboplatin for AUC of 5, Alimta 500 mg/M2 and Keytruda 200 mg IV every 3 weeks status post 9 cycles.  Starting from cycle #5 he was on maintenance treatment with Alimta and Keytruda every 3 weeks. This treatment was discontinued secondary to disease progression.  The patient underwent palliative second line systemic chemotherapy with docetaxel 75 mg/M2 and Cyramza 10 mg/KG every 3 weeks with Neulasta support.  Started May 18, 2020.  Status post 2 cycles. The patient has a rough time tolerating this treatment. He had evidence of disease progression with multiple bilateral pulmonary nodules but stable disease in the dominant mass and right pleural effusion.  His treatment was discontinued and he is currently undergoing palliative systemic chemotherapy with single agent gemcitabine 1000 MG/M2 IV on days 1 and 8 every 3 weeks. He is status 1 cycle and tolerated it fair except for fatigue.    Labs were reviewed. Recommend he proceed with cycle #2 today as scheduled.   We will see him back for a follow up visit in 3 weeks for evaluation before starting cycle #3.   Overall the patient's breathing is stable to sligthly improved. I offerred a thoracentesis to help with the persistent cough. He declined at this time. He will continue with hycodan and albuterol if needed. No wheezing on exam today. Oxygen saturation 95%.   For the ptosis, we will consider repeat brain MRI in 2-3 months to follow up on the indeterminate area on his recent brain  MRI from 07/15/20.   I spent some time discussing the patient's fatigue/anemia today. His Hbg is 8.2. He is symptomatic. Discussed it is reasonable to try to give a blood transfusion at this time. However, I discussed I could see if we can arrange for this on Saturday in our clinic due to availability. If he would like this earlier, we may reach out to our [facility] to see if they can accommodate him. I would need to make a lab appointment for a type and screen. He does not have a port. He would like to just wait at this time until his next lab draw which should be on 08/10/20. I think it is likely he will need a blood transfusion at that time with the downtrending Hbg. I will arrange for him to have a sample to blood bank drawn next week and tentatively see if I can schedule a 5 hour [facility] appointment on 08/11/20 for a blood transfusion for 2 units of blood. If his Hbg improves by next week, we can always cancel this. He is in agreement with this plan.   He will follow with the nutritionist while in infusion today. Discussed he should increase his   The patient  was advised to call immediately if he has any concerning symptoms in the interval. The patient voices understanding of current disease status and treatment options and is in agreement with the current care plan. All questions were answered. The patient knows to call the clinic with any problems, questions or concerns. We can certainly see the patient much sooner if necessary      Orders Placed This Encounter  Procedures  . Sample to Blood Bank    Standing Status:   Future    Standing Expiration Date:   08/03/2021     I spent 20-29 minutes in this encounter.   Colsen Modi L Leanah Kolander, PA-C 08/03/20

## 2020-08-01 ENCOUNTER — Ambulatory Visit (INDEPENDENT_AMBULATORY_CARE_PROVIDER_SITE_OTHER): Payer: Medicare HMO

## 2020-08-01 DIAGNOSIS — I480 Paroxysmal atrial fibrillation: Secondary | ICD-10-CM | POA: Diagnosis not present

## 2020-08-03 ENCOUNTER — Inpatient Hospital Stay: Payer: Medicare HMO | Admitting: Nutrition

## 2020-08-03 ENCOUNTER — Encounter: Payer: Self-pay | Admitting: Physician Assistant

## 2020-08-03 ENCOUNTER — Other Ambulatory Visit: Payer: Medicare HMO

## 2020-08-03 ENCOUNTER — Other Ambulatory Visit: Payer: Self-pay

## 2020-08-03 ENCOUNTER — Inpatient Hospital Stay: Payer: Medicare HMO

## 2020-08-03 ENCOUNTER — Inpatient Hospital Stay: Payer: Medicare HMO | Attending: Internal Medicine | Admitting: Physician Assistant

## 2020-08-03 VITALS — BP 125/89 | HR 102 | Temp 97.5°F | Resp 19 | Wt 182.7 lb

## 2020-08-03 DIAGNOSIS — Z5111 Encounter for antineoplastic chemotherapy: Secondary | ICD-10-CM | POA: Diagnosis not present

## 2020-08-03 DIAGNOSIS — D6481 Anemia due to antineoplastic chemotherapy: Secondary | ICD-10-CM | POA: Insufficient documentation

## 2020-08-03 DIAGNOSIS — C3411 Malignant neoplasm of upper lobe, right bronchus or lung: Secondary | ICD-10-CM

## 2020-08-03 DIAGNOSIS — D649 Anemia, unspecified: Secondary | ICD-10-CM

## 2020-08-03 DIAGNOSIS — Z79899 Other long term (current) drug therapy: Secondary | ICD-10-CM | POA: Insufficient documentation

## 2020-08-03 LAB — CBC WITH DIFFERENTIAL (CANCER CENTER ONLY)
Abs Immature Granulocytes: 0.31 10*3/uL — ABNORMAL HIGH (ref 0.00–0.07)
Basophils Absolute: 0.1 10*3/uL (ref 0.0–0.1)
Basophils Relative: 1 %
Eosinophils Absolute: 0.1 10*3/uL (ref 0.0–0.5)
Eosinophils Relative: 1 %
HCT: 26.7 % — ABNORMAL LOW (ref 39.0–52.0)
Hemoglobin: 8.2 g/dL — ABNORMAL LOW (ref 13.0–17.0)
Immature Granulocytes: 4 %
Lymphocytes Relative: 9 %
Lymphs Abs: 0.7 10*3/uL (ref 0.7–4.0)
MCH: 31.3 pg (ref 26.0–34.0)
MCHC: 30.7 g/dL (ref 30.0–36.0)
MCV: 101.9 fL — ABNORMAL HIGH (ref 80.0–100.0)
Monocytes Absolute: 0.8 10*3/uL (ref 0.1–1.0)
Monocytes Relative: 10 %
Neutro Abs: 5.8 10*3/uL (ref 1.7–7.7)
Neutrophils Relative %: 75 %
Platelet Count: 272 10*3/uL (ref 150–400)
RBC: 2.62 MIL/uL — ABNORMAL LOW (ref 4.22–5.81)
RDW: 17.2 % — ABNORMAL HIGH (ref 11.5–15.5)
WBC Count: 7.8 10*3/uL (ref 4.0–10.5)
nRBC: 0 % (ref 0.0–0.2)

## 2020-08-03 LAB — CMP (CANCER CENTER ONLY)
ALT: 16 U/L (ref 0–44)
AST: 16 U/L (ref 15–41)
Albumin: 2.3 g/dL — ABNORMAL LOW (ref 3.5–5.0)
Alkaline Phosphatase: 64 U/L (ref 38–126)
Anion gap: 9 (ref 5–15)
BUN: 16 mg/dL (ref 8–23)
CO2: 27 mmol/L (ref 22–32)
Calcium: 9 mg/dL (ref 8.9–10.3)
Chloride: 105 mmol/L (ref 98–111)
Creatinine: 1.03 mg/dL (ref 0.61–1.24)
GFR, Estimated: >60 mL/min (ref >60)
Glucose, Bld: 89 mg/dL (ref 70–99)
Potassium: 4.3 mmol/L (ref 3.5–5.1)
Sodium: 141 mmol/L (ref 135–145)
Total Bilirubin: 0.4 mg/dL (ref 0.3–1.2)
Total Protein: 6 g/dL — ABNORMAL LOW (ref 6.5–8.1)

## 2020-08-03 LAB — TSH: TSH: 1.388 u[IU]/mL (ref 0.350–4.500)

## 2020-08-03 LAB — LACTATE DEHYDROGENASE: LDH: 144 U/L (ref 98–192)

## 2020-08-03 MED ORDER — PROCHLORPERAZINE MALEATE 10 MG PO TABS
ORAL_TABLET | ORAL | Status: AC
  Filled 2020-08-03: qty 1

## 2020-08-03 MED ORDER — SODIUM CHLORIDE 0.9 % IV SOLN
1000.0000 mg/m2 | Freq: Once | INTRAVENOUS | Status: AC
  Administered 2020-08-03: 2052 mg via INTRAVENOUS
  Filled 2020-08-03: qty 53.97

## 2020-08-03 MED ORDER — CYANOCOBALAMIN 1000 MCG/ML IJ SOLN
INTRAMUSCULAR | Status: AC
  Filled 2020-08-03: qty 1

## 2020-08-03 MED ORDER — SODIUM CHLORIDE 0.9 % IV SOLN
Freq: Once | INTRAVENOUS | Status: AC
  Filled 2020-08-03: qty 250

## 2020-08-03 MED ORDER — PROCHLORPERAZINE MALEATE 10 MG PO TABS
10.0000 mg | ORAL_TABLET | Freq: Once | ORAL | Status: AC
Start: 2020-08-03 — End: 2020-08-03
  Administered 2020-08-03: 10 mg via ORAL

## 2020-08-03 NOTE — Progress Notes (Signed)
Nutrition follow-up completed with patient during infusion for lung cancer.  Weight documented as 182.7 pounds May 4 increased from 179 pounds April 6. Noted labs: Albumin 2.3 and hemoglobin 8.2. Patient denies nutrition impact symptoms but verbalizes he would like a blood transfusion. Reports taste alterations have improved and his appetite is better.  Nutrition diagnosis: Unintended weight loss improving.  Intervention: Encourage patient to continue strategies for increased calories and protein to minimize loss. Educated patient on diet to improve anemia and provided nutrition fact sheet. Patient appreciative of information.  Monitoring, evaluation, goals: Patient will continue increased calorie and protein intake to minimize weight loss.  Next visit: Wednesday, June 15 during infusion.  **Disclaimer: This note was dictated with voice recognition software. Similar sounding words can inadvertently be transcribed and this note may contain transcription errors which may not have been corrected upon publication of note.**

## 2020-08-03 NOTE — Patient Instructions (Signed)
[facility]  Discharge Instructions: Thank you for choosing [facility] to provide your oncology and hematology care.   If you have a lab appointment with the Cancer Center, please go directly to the Cancer Center and check in at the registration area.   Wear comfortable clothing and clothing appropriate for easy access to any Portacath or PICC line.   We strive to give you quality time with your provider. You may need to reschedule your appointment if you arrive late (15 or more minutes).  Arriving late affects you and other patients whose appointments are after yours.  Also, if you miss three or more appointments without notifying the office, you may be dismissed from the clinic at the provider's discretion.      For prescription refill requests, have your pharmacy contact our office and allow 72 hours for refills to be completed.    Today you received the following chemotherapy and/or immunotherapy agents Gemzar      To help prevent nausea and vomiting after your treatment, we encourage you to take your nausea medication as directed.  BELOW ARE SYMPTOMS THAT SHOULD BE REPORTED IMMEDIATELY: *FEVER GREATER THAN 100.4 F (38 C) OR HIGHER *CHILLS OR SWEATING *NAUSEA AND VOMITING THAT IS NOT CONTROLLED WITH YOUR NAUSEA MEDICATION *UNUSUAL SHORTNESS OF BREATH *UNUSUAL BRUISING OR BLEEDING *URINARY PROBLEMS (pain or burning when urinating, or frequent urination) *BOWEL PROBLEMS (unusual diarrhea, constipation, pain near the anus) TENDERNESS IN MOUTH AND THROAT WITH OR WITHOUT PRESENCE OF ULCERS (sore throat, sores in mouth, or a toothache) UNUSUAL RASH, SWELLING OR PAIN  UNUSUAL VAGINAL DISCHARGE OR ITCHING   Items with * indicate a potential emergency and should be followed up as soon as possible or go to the Emergency Department if any problems should occur.  Please show the CHEMOTHERAPY ALERT CARD or IMMUNOTHERAPY ALERT CARD at check-in to the  Emergency Department and triage nurse.  Should you have questions after your visit or need to cancel or reschedule your appointment, please contact [facility]  Dept: [phone]  and follow the prompts.  Office hours are 8:00 a.m. to 4:30 p.m. Monday - Friday. Please note that voicemails left after 4:00 p.m. may not be returned until the following business day.  We are closed weekends and major holidays. You have access to a nurse at all times for urgent questions. Please call the main number to the clinic Dept: [phone] and follow the prompts.   For any non-urgent questions, you may also contact your provider using MyChart. We now offer e-Visits for anyone 18 and older to request care online for non-urgent symptoms. For details visit [email or web address].   Also download the MyChart app! Go to the app store, search "MyChart", open the app, select [facility], and log in with your MyChart username and password.  Due to Covid, a mask is required upon entering the hospital/clinic. If you do not have a mask, one will be given to you upon arrival. For doctor visits, patients may have 1 support person aged 18 or older with them. For treatment visits, patients cannot have anyone with them due to current Covid guidelines and our immunocompromised population.   

## 2020-08-08 LAB — CUP PACEART REMOTE DEVICE CHECK
Date Time Interrogation Session: 20220507230643
Implantable Pulse Generator Implant Date: 20210907

## 2020-08-10 ENCOUNTER — Other Ambulatory Visit: Payer: Self-pay

## 2020-08-10 ENCOUNTER — Other Ambulatory Visit: Payer: Self-pay | Admitting: Physician Assistant

## 2020-08-10 ENCOUNTER — Other Ambulatory Visit: Payer: Medicare HMO

## 2020-08-10 ENCOUNTER — Inpatient Hospital Stay (HOSPITAL_BASED_OUTPATIENT_CLINIC_OR_DEPARTMENT_OTHER): Payer: Medicare HMO

## 2020-08-10 ENCOUNTER — Inpatient Hospital Stay: Payer: Medicare HMO

## 2020-08-10 VITALS — BP 107/73 | HR 62 | Temp 98.2°F | Resp 17 | Wt 180.8 lb

## 2020-08-10 DIAGNOSIS — Z23 Encounter for immunization: Secondary | ICD-10-CM

## 2020-08-10 DIAGNOSIS — D649 Anemia, unspecified: Secondary | ICD-10-CM

## 2020-08-10 DIAGNOSIS — C3411 Malignant neoplasm of upper lobe, right bronchus or lung: Secondary | ICD-10-CM

## 2020-08-10 DIAGNOSIS — Z79899 Other long term (current) drug therapy: Secondary | ICD-10-CM | POA: Diagnosis not present

## 2020-08-10 DIAGNOSIS — D6481 Anemia due to antineoplastic chemotherapy: Secondary | ICD-10-CM | POA: Diagnosis not present

## 2020-08-10 DIAGNOSIS — Z5111 Encounter for antineoplastic chemotherapy: Secondary | ICD-10-CM | POA: Diagnosis not present

## 2020-08-10 LAB — CBC WITH DIFFERENTIAL (CANCER CENTER ONLY)
Abs Immature Granulocytes: 0.36 10*3/uL — ABNORMAL HIGH (ref 0.00–0.07)
Basophils Absolute: 0.1 10*3/uL (ref 0.0–0.1)
Basophils Relative: 1 %
Eosinophils Absolute: 0 10*3/uL (ref 0.0–0.5)
Eosinophils Relative: 0 %
HCT: 26.1 % — ABNORMAL LOW (ref 39.0–52.0)
Hemoglobin: 8 g/dL — ABNORMAL LOW (ref 13.0–17.0)
Immature Granulocytes: 10 %
Lymphocytes Relative: 21 %
Lymphs Abs: 0.8 10*3/uL (ref 0.7–4.0)
MCH: 30.8 pg (ref 26.0–34.0)
MCHC: 30.7 g/dL (ref 30.0–36.0)
MCV: 100.4 fL — ABNORMAL HIGH (ref 80.0–100.0)
Monocytes Absolute: 0.7 10*3/uL (ref 0.1–1.0)
Monocytes Relative: 19 %
Neutro Abs: 1.9 10*3/uL (ref 1.7–7.7)
Neutrophils Relative %: 49 %
Platelet Count: 345 10*3/uL (ref 150–400)
RBC: 2.6 MIL/uL — ABNORMAL LOW (ref 4.22–5.81)
RDW: 16.8 % — ABNORMAL HIGH (ref 11.5–15.5)
WBC Count: 3.8 10*3/uL — ABNORMAL LOW (ref 4.0–10.5)
nRBC: 1.1 % — ABNORMAL HIGH (ref 0.0–0.2)

## 2020-08-10 LAB — CMP (CANCER CENTER ONLY)
ALT: 53 U/L — ABNORMAL HIGH (ref 0–44)
AST: 42 U/L — ABNORMAL HIGH (ref 15–41)
Albumin: 2.5 g/dL — ABNORMAL LOW (ref 3.5–5.0)
Alkaline Phosphatase: 69 U/L (ref 38–126)
Anion gap: 11 (ref 5–15)
BUN: 16 mg/dL (ref 8–23)
CO2: 25 mmol/L (ref 22–32)
Calcium: 9.1 mg/dL (ref 8.9–10.3)
Chloride: 104 mmol/L (ref 98–111)
Creatinine: 1.08 mg/dL (ref 0.61–1.24)
GFR, Estimated: >60 mL/min (ref >60)
Glucose, Bld: 101 mg/dL — ABNORMAL HIGH (ref 70–99)
Potassium: 4.3 mmol/L (ref 3.5–5.1)
Sodium: 140 mmol/L (ref 135–145)
Total Bilirubin: 0.5 mg/dL (ref 0.3–1.2)
Total Protein: 6.3 g/dL — ABNORMAL LOW (ref 6.5–8.1)

## 2020-08-10 LAB — SAMPLE TO BLOOD BANK

## 2020-08-10 LAB — PREPARE RBC (CROSSMATCH)

## 2020-08-10 MED ORDER — PROCHLORPERAZINE MALEATE 10 MG PO TABS
ORAL_TABLET | ORAL | Status: AC
  Filled 2020-08-10: qty 1

## 2020-08-10 MED ORDER — SODIUM CHLORIDE 0.9 % IV SOLN
Freq: Once | INTRAVENOUS | Status: AC
  Filled 2020-08-10: qty 250

## 2020-08-10 MED ORDER — DIPHENHYDRAMINE HCL 25 MG PO CAPS
25.0000 mg | ORAL_CAPSULE | Freq: Once | ORAL | Status: AC
  Administered 2020-08-10: 25 mg via ORAL

## 2020-08-10 MED ORDER — SODIUM CHLORIDE 0.9% IV SOLUTION
250.0000 mL | Freq: Once | INTRAVENOUS | Status: AC
  Administered 2020-08-10: 250 mL via INTRAVENOUS
  Filled 2020-08-10: qty 250

## 2020-08-10 MED ORDER — ACETAMINOPHEN 325 MG PO TABS
650.0000 mg | ORAL_TABLET | Freq: Once | ORAL | Status: AC
  Administered 2020-08-10: 650 mg via ORAL

## 2020-08-10 MED ORDER — DIPHENHYDRAMINE HCL 25 MG PO CAPS
ORAL_CAPSULE | ORAL | Status: AC
  Filled 2020-08-10: qty 1

## 2020-08-10 MED ORDER — SODIUM CHLORIDE 0.9 % IV SOLN
1000.0000 mg/m2 | Freq: Once | INTRAVENOUS | Status: AC
  Administered 2020-08-10: 2052 mg via INTRAVENOUS
  Filled 2020-08-10: qty 53.97

## 2020-08-10 MED ORDER — ACETAMINOPHEN 325 MG PO TABS
ORAL_TABLET | ORAL | Status: AC
  Filled 2020-08-10: qty 2

## 2020-08-10 MED ORDER — PROCHLORPERAZINE MALEATE 10 MG PO TABS
10.0000 mg | ORAL_TABLET | Freq: Once | ORAL | Status: AC
Start: 2020-08-10 — End: 2020-08-10
  Administered 2020-08-10: 10 mg via ORAL

## 2020-08-10 NOTE — Patient Instructions (Signed)
[facility] Discharge Instructions for Patients Receiving Chemotherapy  Today you received the following chemotherapy agents Gemzar  To help prevent nausea and vomiting after your treatment, we encourage you to take your nausea medication as directed   If you develop nausea and vomiting that is not controlled by your nausea medication, call the clinic.   BELOW ARE SYMPTOMS THAT SHOULD BE REPORTED IMMEDIATELY:  *FEVER GREATER THAN 100.5 F  *CHILLS WITH OR WITHOUT FEVER  NAUSEA AND VOMITING THAT IS NOT CONTROLLED WITH YOUR NAUSEA MEDICATION  *UNUSUAL SHORTNESS OF BREATH  *UNUSUAL BRUISING OR BLEEDING  TENDERNESS IN MOUTH AND THROAT WITH OR WITHOUT PRESENCE OF ULCERS  *URINARY PROBLEMS  *BOWEL PROBLEMS  UNUSUAL RASH Items with * indicate a potential emergency and should be followed up as soon as possible.  Feel free to call the clinic should you have any questions or concerns. The clinic phone number is [phone].  Please show the [place] at check-in to the Emergency Department and triage nurse.   Blood Transfusion, Adult A blood transfusion is a procedure in which you receive blood through an IV tube. You may need this procedure because of:  A bleeding disorder.  An illness.  An injury.  A surgery. The blood may come from someone else (a donor). You may also be able to donate blood for yourself. The blood given in a transfusion is made up of different types of cells. You may get:  Red blood cells. These carry oxygen to the cells in the body.  White blood cells. These help you fight infections.  Platelets. These help your blood to clot.  Plasma. This is the liquid part of your blood. It carries proteins and other substances through the body. If you have a clotting disorder, you may also get other types of blood products. Tell your doctor about:  Any blood disorders you have.  Any reactions you have had during a blood transfusion in  the past.  Any allergies you have.  All medicines you are taking, including vitamins, herbs, eye drops, creams, and over-the-counter medicines.  Any surgeries you have had.  Any medical conditions you have. This includes any recent fever or cold symptoms.  Whether you are pregnant or may be pregnant. What are the risks? Generally, this is a safe procedure. However, problems may occur.  The most common problems include: ? A mild allergic reaction. This includes red, swollen areas of skin (hives) and itching. ? Fever or chills. This may be the body's response to new blood cells received. This may happen during or up to 4 hours after the transfusion.  More serious problems may include: ? Too much fluid in the lungs. This may cause breathing problems. ? A serious allergic reaction. This includes breathing trouble or swelling around the face and lips. ? Lung injury. This causes breathing trouble and low oxygen in the blood. This can happen within hours of the transfusion or days later. ? Too much iron. This can happen after getting many blood transfusions over a period of time. ? An infection or virus passed through the blood. This is rare. Donated blood is carefully tested before it is given. ? Your body's defense system (immune system) trying to attack the new blood cells. This is rare. Symptoms may include fever, chills, nausea, low blood pressure, and low back or chest pain. ? Donated cells attacking healthy tissues. This is rare. What happens before the procedure? Medicines Ask your doctor about:  Changing  or stopping your normal medicines. This is important.  Taking aspirin and ibuprofen. Do not take these medicines unless your doctor tells you to take them.  Taking over-the-counter medicines, vitamins, herbs, and supplements. General instructions  Follow instructions from your doctor about what you cannot eat or drink.  You will have a blood test to find out your blood type.  The test also finds out what type of blood your body will accept and matches it to the donor type.  If you are going to have a planned surgery, you may be able to donate your own blood. This may be done in case you need a transfusion.  You will have your temperature, blood pressure, and pulse checked.  You may receive medicine to help prevent an allergic reaction. This may be done if you have had a reaction to a transfusion before. This medicine may be given to you by mouth or through an IV tube.  This procedure lasts about 1-4 hours. Plan for the time you need. What happens during the procedure?  An IV tube will be put into one of your veins.  The bag of donated blood will be attached to your IV tube. Then, the blood will enter through your vein.  Your temperature, blood pressure, and pulse will be checked often. This is done to find early signs of a transfusion reaction.  Tell your nurse right away if you have any of these symptoms: ? Shortness of breath or trouble breathing. ? Chest or back pain. ? Fever or chills. ? Red, swollen areas of skin or itching.  If you have any signs or symptoms of a reaction, your transfusion will be stopped. You may also be given medicine.  When the transfusion is finished, your IV tube will be taken out.  Pressure may be put on the IV site for a few minutes.  A bandage (dressing) will be put on the IV site. The procedure may vary among doctors and hospitals.   What happens after the procedure?  You will be monitored until you leave the hospital or clinic. This includes checking your temperature, blood pressure, pulse, breathing rate, and blood oxygen level.  Your blood may be tested to see how you are responding to the transfusion.  You may be warmed with fluids or blankets. This is done to keep the temperature of your body normal.  If you have your procedure in an outpatient setting, you will be told whom to contact to report any  reactions. Where to find more information To learn more, visit the American Red Cross: redcross.org Summary  A blood transfusion is a procedure in which you are given blood through an IV tube.  The blood may come from someone else (a donor). You may also be able to donate blood for yourself.  The blood you are given is made up of different blood cells. You may receive red blood cells, platelets, plasma, or white blood cells.  Your temperature, blood pressure, and pulse will be checked often.  After the procedure, your blood may be tested to see how you are responding. This information is not intended to replace advice given to you by your health care provider. Make sure you discuss any questions you have with your health care provider. Document Revised: 09/11/2018 Document Reviewed: 09/11/2018 Elsevier Patient Education  [place].

## 2020-08-11 LAB — TYPE AND SCREEN
ABO/RH(D): B POS
Antibody Screen: NEGATIVE
Unit division: 0
Unit division: 0

## 2020-08-11 LAB — BPAM RBC
Blood Product Expiration Date: 202205282359
Blood Product Expiration Date: 202205302359
ISSUE DATE / TIME: 202205111025
ISSUE DATE / TIME: 202205111025
Unit Type and Rh: 7300
Unit Type and Rh: 7300

## 2020-08-17 ENCOUNTER — Ambulatory Visit: Payer: Medicare HMO | Admitting: Internal Medicine

## 2020-08-17 ENCOUNTER — Other Ambulatory Visit: Payer: Medicare HMO

## 2020-08-17 ENCOUNTER — Ambulatory Visit: Payer: Medicare HMO

## 2020-08-19 ENCOUNTER — Telehealth: Payer: Self-pay | Admitting: *Deleted

## 2020-08-19 ENCOUNTER — Ambulatory Visit: Payer: Medicare HMO

## 2020-08-19 MED ORDER — DILTIAZEM HCL ER COATED BEADS 180 MG PO CP24: 180.0000 mg | ORAL_CAPSULE | Freq: Every day | ORAL | 6 refills | Status: DC

## 2020-08-19 NOTE — Telephone Encounter (Signed)
ILR transmission findings reviewed w/ pt and informed of MD recommendation. Discussed medication and aware I will send him information via mychart. Advised to contact office if SE occur after starting. Patient verbalized understanding and agreeable to plan.

## 2020-08-19 NOTE — Telephone Encounter (Signed)
-----   Message from Will Meredith Leeds, MD sent at 08/08/2020  1:51 PM EDT ----- Abnormal LINQ reviewed. Notable for AF on [place].  Burden at 23%.  Start diltiazem 180 mg.

## 2020-08-19 NOTE — Progress Notes (Signed)
[facility] Summary Report / Loop Recorder 

## 2020-08-20 NOTE — Progress Notes (Addendum)
[facility] [address], [place], [place] [address] [phone]  Date:  08/22/2020   Name:  Micheal Oliver   DOB:  12-Oct-1950   MRN:  [id number]  PCP:  Darreld Mclean, MD    Chief Complaint: Diabetes (4-6 month follow up/)   History of Present Illness:  Micheal Oliver is a 70 y.o. very pleasant male patient who presents with the following:  Patient is here today for periodic follow-up visit Most recently seen by myself in December- -History of hypertension, diabetes, hyperlipidemia, family history of early CAD, ED, atrial fibrillation, stage IV lung cancer which was diagnosed during admission for other causes in July 2021, history of stroke   Eye exam Shingles vaccine? Covid Complete including fourth dose Lab Results  Component Value Date   HGBA1C 6.3 03/24/2020   Recent CMP, CBC on chart, can update A1c if he likes  Wt Readings from Last 3 Encounters:  08/22/20 180 lb (81.6 kg)  08/10/20 180 lb 12.8 oz (82 kg)  08/03/20 182 lb 11.2 oz (82.9 kg)   He is taking gabapentin at bedtime- he is on this for eye twitching and would like to stop using this if he can  They just went to [place] for a family vacation and had a good time His energy level is "ok" but not great   Most recent visit with oncology earlier this month-he has completed radiation and chemotherapy-he did have evidence of disease progression and is now on palliative systemic chemotherapy every 3 weeks  He was seen in the a fib clinic in April: ASSESSMENT AND PLAN: 1. Paroxysmal Atrial Fibrillation (ICD10:  I48.0) The patient's CHA2DS2-VASc score is 5, indicating a 7.2% annual risk of stroke.   Patient in sinus tachycardia today. AF burden on ILR 1.6%.  We discussed rate controlling medication today and patient states "I don't want to take another pill." Continue with present therapy for now. Continue Eliquis 5 mg BID 2. Secondary  Hypercoagulable State (ICD10:  D68.69) The patient is at significant risk for stroke/thromboembolism based upon his CHA2DS2-VASc Score of 5.  Continue Apixaban (Eliquis).  3. HTN Stable, no changes today. Patient Active Problem List   Diagnosis Date Noted  . Paroxysmal atrial fibrillation ([place]) 04/19/2020  . Secondary hypercoagulable state ([place]) 04/19/2020  . Thyroid nodule 11/26/2019  . Oral thrush 11/10/2019  . Cough 11/10/2019  . Encounter for antineoplastic chemotherapy 10/28/2019  . Encounter for antineoplastic immunotherapy 10/28/2019  . Goals of care, counseling/discussion 10/15/2019  . Malignant neoplasm of right upper lobe of lung ([place]) 10/11/2019  . Mediastinal lymphadenopathy 09/30/2019  . Mass of right lung 09/24/2019  . Stroke ([place]) 09/18/2019  . Hypertension associated with diabetes ([place]) 09/18/2019  . Hyperlipidemia associated with type 2 diabetes mellitus ([place]) 09/18/2019  . Eyelid twitch 09/28/2018  . Controlled type 2 diabetes mellitus without complication, without long-term current use of insulin ([place]) 07/12/2016  . Hyperlipidemia 01/10/2016  . Family history of early CAD 07/11/2015  . Essential hypertension, benign 01/29/2014  . Erectile dysfunction 10/16/2013    Past Medical History:  Diagnosis Date  . Chicken pox   . DM2 (diabetes mellitus, type 2) ([place])   . History of kidney stones    passed several   . Hypertension   . Kidney stones    5-6 times  . Measles   . Mumps   . Stroke ([place])    x 2, last one was 09/18/2019- has  some expressive aphasia    Past Surgical History:  Procedure Laterality Date  . COLONOSCOPY W/ POLYPECTOMY    . VIDEO BRONCHOSCOPY WITH ENDOBRONCHIAL ULTRASOUND N/A 09/30/2019   Procedure: VIDEO BRONCHOSCOPY WITH ENDOBRONCHIAL ULTRASOUND;  Surgeon: Collene Gobble, MD;  Location: [facility];  Service: Thoracic;  Laterality: N/A;  . WISDOM TOOTH EXTRACTION  1976-77    Social History   Tobacco Use  . Smoking status: Never Smoker  .  Smokeless tobacco: Never Used  Vaping Use  . Vaping Use: Never used  Substance Use Topics  . Alcohol use: No  . Drug use: No    Family History  Problem Relation Age of Onset  . Alcohol abuse Father 93       Deceased  . Heart disease Father   . Lung disease Father   . Hypertension Father   . Stomach cancer Mother 38       Deceased  . Hypertension Paternal Grandmother   . Dementia Paternal Grandmother   . Stroke Paternal Grandmother   . Arthritis Maternal Grandmother   . Sudden death Paternal Grandfather   . Sudden death Maternal Grandfather   . Heart attack Paternal Aunt   . Other Maternal Aunt        Natural Causes  . Healthy Son   . Anxiety disorder Daughter     Allergies  Allergen Reactions  . Bee Venom     Lip swelling    Medication list has been reviewed and updated.  Current Outpatient Medications on File Prior to Visit  Medication Sig Dispense Refill  . acetaminophen (TYLENOL) 500 MG tablet Take 500 mg by mouth every 6 (six) hours as needed.     Marland Kitchen albuterol (VENTOLIN HFA) 108 (90 Base) MCG/ACT inhaler Inhale 2 puffs into the lungs every 6 (six) hours as needed for wheezing or shortness of breath. 8 g 2  . apixaban (ELIQUIS) 5 MG TABS tablet Take 1 tablet (5 mg total) by mouth 2 (two) times daily. 60 tablet 11  . atorvastatin (LIPITOR) 40 MG tablet Take 1 tablet (40 mg total) by mouth daily at 6 PM. 90 tablet 3  . diltiazem (CARDIZEM CD) 180 MG 24 hr capsule Take 1 capsule (180 mg total) by mouth daily. 30 capsule 6  . folic acid (FOLVITE) 1 MG tablet Take 1 tablet (1 mg total) by mouth daily. 90 tablet 3  . gabapentin (NEURONTIN) 300 MG capsule Take 1 capsule (300 mg total) by mouth at bedtime. 90 capsule 3  . metFORMIN (GLUCOPHAGE) 500 MG tablet TAKE TWO TABLETS BY MOUTH TWICE A DAY WITH MEAL(S) 360 tablet 3  . Omega-3 Fatty Acids (FISH OIL) 1200 MG CAPS Take by mouth daily.    . prochlorperazine (COMPAZINE) 10 MG tablet Take 1 tablet (10 mg total) by mouth  every 6 (six) hours as needed. 30 tablet 2  . sitaGLIPtin (JANUVIA) 100 MG tablet Take 1 tablet (100 mg total) by mouth daily. 90 tablet 3   No current facility-administered medications on file prior to visit.    Review of Systems:  As per HPI- otherwise negative.   Physical Examination: Vitals:   08/22/20 0815  BP: 108/64  Pulse: 92  Resp: 16  Temp: (!) 97.2 F (36.2 C)  SpO2: 94%   Vitals:   08/22/20 0815  Weight: 180 lb (81.6 kg)  Height: 6\' 2"  (1.88 m)   Body mass index is 23.11 kg/m. Ideal Body Weight: Weight in (lb) to have BMI = 25: 194.3  GEN: no acute distress.  Normal weight, appears chronically ill- cancer  HEENT: Atraumatic, Normocephalic.  TM wnl bilaterally  Ears and Nose: No external deformity. CV: rate controlled a fib, No M/G/R. No JVD. No thrill. No extra heart sounds. PULM: CTA B, no wheezes, crackles, rhonchi. No retractions. No resp. distress. No accessory muscle use. ABD: S, NT, ND, +BS. No rebound. No HSM. EXTR: No c/c/.  Very mild edema of feet and ankles bilaterally  PSYCH: Normally interactive. Conversant.    Assessment and Plan: Controlled type 2 diabetes mellitus without complication, without long-term current use of insulin ([place]) - Plan: Hemoglobin A1c  Hyperlipidemia, unspecified hyperlipidemia type  Essential hypertension, benign  Malignant neoplasm of right upper lobe of lung (HCC)  History of CVA (cerebrovascular accident)  Paroxysmal atrial fibrillation (H[facility])  Eye twitch - Plan: gabapentin (NEURONTIN) 100 MG capsule  Pt following up today He is unfortunately ill with a difficulty to control lung cancer- offered support and encouragement  He would like to come off gabapentin which he is using for eye twitching- will taper off  Rate controlled a fib on anticoagulation BP well controlled Likely can decrease diabetes meds pending his A1c- plan to stop januvia and possible decrease metformin This visit occurred during the  SARS-CoV-2 public health emergency.  Safety protocols were in place, including screening questions prior to the visit, additional usage of staff PPE, and extensive cleaning of exam room while observing appropriate contact time as indicated for disinfecting solutions.    Signed Lamar Blinks, MD  Received his A1c as below-message to pt  Results for orders placed or performed in visit on 08/22/20  Hemoglobin A1c  Result Value Ref Range   Hgb A1c MFr Bld 6.6 (H) 4.6 - 6.5 %

## 2020-08-20 NOTE — Patient Instructions (Addendum)
It was great to see you again today!  I will be in touch with your A1c- probably we can decrease your diabetes medication I suspect at the least we can stop Januvia You might try applying to the eliquis manufacturer for cost savings  Assuming all is well we can visit in 6 months I would like to potentially get you the shingles vaccine if safe with your current cancer treatment. Can you please ask your oncologist about this at next visit- Shingrix

## 2020-08-22 ENCOUNTER — Encounter: Payer: Self-pay | Admitting: Family Medicine

## 2020-08-22 ENCOUNTER — Ambulatory Visit (INDEPENDENT_AMBULATORY_CARE_PROVIDER_SITE_OTHER): Payer: Medicare HMO | Admitting: Family Medicine

## 2020-08-22 ENCOUNTER — Other Ambulatory Visit: Payer: Self-pay

## 2020-08-22 VITALS — BP 108/64 | HR 92 | Temp 97.2°F | Resp 16 | Ht 74.0 in | Wt 180.0 lb

## 2020-08-22 DIAGNOSIS — Z8673 Personal history of transient ischemic attack (TIA), and cerebral infarction without residual deficits: Secondary | ICD-10-CM

## 2020-08-22 DIAGNOSIS — C3411 Malignant neoplasm of upper lobe, right bronchus or lung: Secondary | ICD-10-CM

## 2020-08-22 DIAGNOSIS — E119 Type 2 diabetes mellitus without complications: Secondary | ICD-10-CM | POA: Diagnosis not present

## 2020-08-22 DIAGNOSIS — I10 Essential (primary) hypertension: Secondary | ICD-10-CM | POA: Diagnosis not present

## 2020-08-22 DIAGNOSIS — E785 Hyperlipidemia, unspecified: Secondary | ICD-10-CM | POA: Diagnosis not present

## 2020-08-22 DIAGNOSIS — I480 Paroxysmal atrial fibrillation: Secondary | ICD-10-CM | POA: Diagnosis not present

## 2020-08-22 DIAGNOSIS — G245 Blepharospasm: Secondary | ICD-10-CM

## 2020-08-22 LAB — HEMOGLOBIN A1C: Hgb A1c MFr Bld: 6.6 % — ABNORMAL HIGH (ref 4.6–6.5)

## 2020-08-22 MED ORDER — GABAPENTIN 100 MG PO CAPS: ORAL_CAPSULE | ORAL | 3 refills | Status: DC

## 2020-08-22 NOTE — Addendum Note (Signed)
Addended by: Lamar Blinks C on: 08/22/2020 07:15 PM   Modules accepted: Orders

## 2020-08-24 ENCOUNTER — Inpatient Hospital Stay: Payer: Medicare HMO | Admitting: Internal Medicine

## 2020-08-24 ENCOUNTER — Inpatient Hospital Stay: Payer: Medicare HMO

## 2020-08-24 ENCOUNTER — Other Ambulatory Visit: Payer: Self-pay

## 2020-08-24 ENCOUNTER — Encounter: Payer: Self-pay | Admitting: Internal Medicine

## 2020-08-24 VITALS — BP 123/83 | HR 94 | Temp 97.4°F | Resp 20 | Ht 74.0 in | Wt 182.3 lb

## 2020-08-24 DIAGNOSIS — D6481 Anemia due to antineoplastic chemotherapy: Secondary | ICD-10-CM | POA: Diagnosis not present

## 2020-08-24 DIAGNOSIS — C3490 Malignant neoplasm of unspecified part of unspecified bronchus or lung: Secondary | ICD-10-CM

## 2020-08-24 DIAGNOSIS — C3411 Malignant neoplasm of upper lobe, right bronchus or lung: Secondary | ICD-10-CM

## 2020-08-24 DIAGNOSIS — Z5111 Encounter for antineoplastic chemotherapy: Secondary | ICD-10-CM

## 2020-08-24 DIAGNOSIS — Z79899 Other long term (current) drug therapy: Secondary | ICD-10-CM | POA: Diagnosis not present

## 2020-08-24 DIAGNOSIS — I10 Essential (primary) hypertension: Secondary | ICD-10-CM

## 2020-08-24 LAB — CMP (CANCER CENTER ONLY)
ALT: 21 U/L (ref 0–44)
AST: 16 U/L (ref 15–41)
Albumin: 2.9 g/dL — ABNORMAL LOW (ref 3.5–5.0)
Alkaline Phosphatase: 80 U/L (ref 38–126)
Anion gap: 11 (ref 5–15)
BUN: 17 mg/dL (ref 8–23)
CO2: 24 mmol/L (ref 22–32)
Calcium: 9.2 mg/dL (ref 8.9–10.3)
Chloride: 107 mmol/L (ref 98–111)
Creatinine: 1.07 mg/dL (ref 0.61–1.24)
GFR, Estimated: >60 mL/min (ref >60)
Glucose, Bld: 103 mg/dL — ABNORMAL HIGH (ref 70–99)
Potassium: 4.2 mmol/L (ref 3.5–5.1)
Sodium: 142 mmol/L (ref 135–145)
Total Bilirubin: 0.8 mg/dL (ref 0.3–1.2)
Total Protein: 6.6 g/dL (ref 6.5–8.1)

## 2020-08-24 LAB — CBC WITH DIFFERENTIAL (CANCER CENTER ONLY)
Abs Immature Granulocytes: 0.1 10*3/uL — ABNORMAL HIGH (ref 0.00–0.07)
Basophils Absolute: 0.1 10*3/uL (ref 0.0–0.1)
Basophils Relative: 1 %
Eosinophils Absolute: 0 10*3/uL (ref 0.0–0.5)
Eosinophils Relative: 0 %
HCT: 33.2 % — ABNORMAL LOW (ref 39.0–52.0)
Hemoglobin: 10.6 g/dL — ABNORMAL LOW (ref 13.0–17.0)
Immature Granulocytes: 1 %
Lymphocytes Relative: 8 %
Lymphs Abs: 0.7 10*3/uL (ref 0.7–4.0)
MCH: 31.2 pg (ref 26.0–34.0)
MCHC: 31.9 g/dL (ref 30.0–36.0)
MCV: 97.6 fL (ref 80.0–100.0)
Monocytes Absolute: 0.9 10*3/uL (ref 0.1–1.0)
Monocytes Relative: 11 %
Neutro Abs: 6.4 10*3/uL (ref 1.7–7.7)
Neutrophils Relative %: 79 %
Platelet Count: 351 10*3/uL (ref 150–400)
RBC: 3.4 MIL/uL — ABNORMAL LOW (ref 4.22–5.81)
RDW: 18.3 % — ABNORMAL HIGH (ref 11.5–15.5)
WBC Count: 8.2 10*3/uL (ref 4.0–10.5)
nRBC: 0 % (ref 0.0–0.2)

## 2020-08-24 LAB — LACTATE DEHYDROGENASE: LDH: 160 U/L (ref 98–192)

## 2020-08-24 LAB — TSH: TSH: 1.807 u[IU]/mL (ref 0.320–4.118)

## 2020-08-24 MED ORDER — SODIUM CHLORIDE 0.9 % IV SOLN
1000.0000 mg/m2 | Freq: Once | INTRAVENOUS | Status: AC
  Administered 2020-08-24: 2052 mg via INTRAVENOUS
  Filled 2020-08-24: qty 53.97

## 2020-08-24 MED ORDER — PROCHLORPERAZINE MALEATE 10 MG PO TABS
10.0000 mg | ORAL_TABLET | Freq: Once | ORAL | Status: AC
  Administered 2020-08-24: 10 mg via ORAL

## 2020-08-24 MED ORDER — SODIUM CHLORIDE 0.9 % IV SOLN
Freq: Once | INTRAVENOUS | Status: AC
  Filled 2020-08-24: qty 250

## 2020-08-24 MED ORDER — PROCHLORPERAZINE MALEATE 10 MG PO TABS
ORAL_TABLET | ORAL | Status: AC
  Filled 2020-08-24: qty 1

## 2020-08-24 NOTE — Patient Instructions (Signed)
[facility] ONCOLOGY   Discharge Instructions: Thank you for choosing [facility] to provide your oncology and hematology care.   If you have a lab appointment with the [facility], please go directly to the [facility] and check in at the registration area.   Wear comfortable clothing and clothing appropriate for easy access to any Portacath or PICC line.   We strive to give you quality time with your provider. You may need to reschedule your appointment if you arrive late (15 or more minutes).  Arriving late affects you and other patients whose appointments are after yours.  Also, if you miss three or more appointments without notifying the office, you may be dismissed from the clinic at the provider's discretion.      For prescription refill requests, have your pharmacy contact our office and allow 72 hours for refills to be completed.    Today you received the following chemotherapy and/or immunotherapy agents: Gemcitabine (Gemzar)     To help prevent nausea and vomiting after your treatment, we encourage you to take your nausea medication as directed.  BELOW ARE SYMPTOMS THAT SHOULD BE REPORTED IMMEDIATELY: . *FEVER GREATER THAN 100.4 F (38 C) OR HIGHER . *CHILLS OR SWEATING . *NAUSEA AND VOMITING THAT IS NOT CONTROLLED WITH YOUR NAUSEA MEDICATION . *UNUSUAL SHORTNESS OF BREATH . *UNUSUAL BRUISING OR BLEEDING . *URINARY PROBLEMS (pain or burning when urinating, or frequent urination) . *BOWEL PROBLEMS (unusual diarrhea, constipation, pain near the anus) . TENDERNESS IN MOUTH AND THROAT WITH OR WITHOUT PRESENCE OF ULCERS (sore throat, sores in mouth, or a toothache) . UNUSUAL RASH, SWELLING OR PAIN  . UNUSUAL VAGINAL DISCHARGE OR ITCHING   Items with * indicate a potential emergency and should be followed up as soon as possible or go to the Emergency Department if any problems should occur.  Please show the CHEMOTHERAPY ALERT CARD or  IMMUNOTHERAPY ALERT CARD at check-in to the Emergency Department and triage nurse.  Should you have questions after your visit or need to cancel or reschedule your appointment, please contact [facility]  Dept: [phone]  and follow the prompts.  Office hours are 8:00 a.m. to 4:30 p.m. Monday - Friday. Please note that voicemails left after 4:00 p.m. may not be returned until the following business day.  We are closed weekends and major holidays. You have access to a nurse at all times for urgent questions. Please call the main number to the clinic Dept: [phone] and follow the prompts.   For any non-urgent questions, you may also contact your provider using MyChart. We now offer e-Visits for anyone 48 and older to request care online for non-urgent symptoms. For details visit [email or web address].   Also download the MyChart app! Go to the app store, search "MyChart", open the app, select [facility], and log in with your MyChart username and password.  Due to Covid, a mask is required upon entering the hospital/clinic. If you do not have a mask, one will be given to you upon arrival. For doctor visits, patients may have 1 support person aged 79 or older with them. For treatment visits, patients cannot have anyone with them due to current Covid guidelines and our immunocompromised population.

## 2020-08-24 NOTE — Progress Notes (Signed)
[facility] [phone]   [phone]  OFFICE PROGRESS NOTE  Copland, Gay Filler, MD [address] [place] [address]  DIAGNOSIS: Stage IV (T3, N2, M1a) non-small cell lung cancer, adenocarcinoma presented with large right upper lobe lung mass extending to the right suprahilar region in addition to right hilar and mediastinal lymphadenopathy with obstruction of the associated upper lobe bronchus and bilateral subcentimeter pulmonary nodules diagnosed in July 2021.  Biomarker Findings Microsatellite status - MS-Stable Tumor Mutational Burden - 3 Muts/Mb Genomic Findings For a complete list of the genes assayed, please refer to the Appendix. ERBB2 [id number], amplification - equivocal? HGF amplification - equivocal? MTAP loss exons 5-8 MYC amplification - equivocal? CDKN2A/B CDKN2B loss, CDKN2A loss CUL3 Y68f*19 RPTOR amplification TP53 [id number] Disease relevant genes with no reportable alterations: ALK, BRAF, EGFR, KRAS, MET, RET, ROS1  PDL1 Expression: Negative  PRIOR THERAPY:  1) Palliative radiotherapy to the obstructive lung mass under the care of Dr. MoLisbeth RenshawThe past treatment was scheduled for8/23/21. 2) Systemic chemotherapy with carboplatin for AUC of 5, Alimta 500 mg/M2 and Keytruda 200 mg IV every 3 weeks.  First dose November 04, 2019.  Status post 9 cycles.  Starting from cycle #5 he will be on maintenance treatment with Alimta and Keytruda every 3 weeks. 3) Second line systemic chemotherapy with docetaxel 75 mg/M2 and Cyramza 10 mg/KG every weeks with Neulasta support.  First dose May 18, 2020.  Status post 2 cycles.  Last dose was given June 17, 2020 discontinued secondary to disease progression.  CURRENT THERAPY: Third line systemic chemotherapy with single agent gemcitabine 1000 mg/M2 on days 1 and 8 every 3 weeks.  First dose July 13, 2020.  Status post 2 cycles.  INTERVAL HISTORY: [name]70.o. male returns to the clinic today for follow-up visit.  The patient is feeling fine today with no concerning complaints.  His fatigue has improved after he received 2 units of PRBCs transfusion for the chemotherapy-induced anemia.  He denied having any current chest pain, shortness of breath, cough or hemoptysis.  He denied having any fever or chills.  He has no nausea, vomiting, diarrhea or constipation.  He has no headache or visual changes.  He is here today for evaluation before starting cycle #3.   MEDICAL HISTORY: Past Medical History:  Diagnosis Date  . Chicken pox   . [facility]  . History of kidney stones    passed several   . Hypertension   . Kidney stones    5-6 times  . Measles   . Mumps   . [facility] [facility]   [facility], last one was 09/18/2019- has some expressive aphasia    ALLERGIES:  is allergic to bee venom.  MEDICATIONS:  Current Outpatient Medications  Medication Sig Dispense Refill  . acetaminophen (TYLENOL) 500 MG tablet Take 500 mg by mouth every 6 (six) hours as needed.     . Marland Kitchenlbuterol (VENTOLIN HFA) 108 (90 Base) MCG/ACT inhaler Inhale 2 puffs into the lungs every 6 (six) hours as needed for wheezing or shortness of breath. 8 g 2  . apixaban (ELIQUIS) 5 MG TABS tablet Take 1 tablet (5 mg total) by mouth 2 (two) times daily. 60 tablet 11  . atorvastatin (LIPITOR) 40 MG tablet Take 1 tablet (40 mg total) by mouth daily at 6 PM. 90 tablet 3  . diltiazem (CARDIZEM CD) 180 MG  24 hr capsule Take 1 capsule (180 mg total) by mouth daily. 30 capsule 6  . folic acid (FOLVITE) 1 MG tablet Take 1 tablet (1 mg total) by mouth daily. 90 tablet 3  . gabapentin (NEURONTIN) 100 MG capsule Take 200 mg at bedtime for one week, then 100 mg at bedtime for one week, then stop 30 capsule 3  . metFORMIN (GLUCOPHAGE) 500 MG tablet TAKE TWO TABLETS BY MOUTH TWICE A DAY WITH MEAL(S) 360 tablet 3  . Omega-3 Fatty Acids (FISH OIL) 1200 MG CAPS Take by mouth  daily.    . prochlorperazine (COMPAZINE) 10 MG tablet Take 1 tablet (10 mg total) by mouth every 6 (six) hours as needed. 30 tablet 2   No current facility-administered medications for this visit.    SURGICAL HISTORY:  Past Surgical History:  Procedure Laterality Date  . COLONOSCOPY W/ POLYPECTOMY    . VIDEO BRONCHOSCOPY WITH ENDOBRONCHIAL ULTRASOUND N/A 09/30/2019   Procedure: VIDEO BRONCHOSCOPY WITH ENDOBRONCHIAL ULTRASOUND;  Surgeon: Collene Gobble, MD;  Location: [facility];  Service: Thoracic;  Laterality: N/A;  . WISDOM TOOTH EXTRACTION  1976-77    REVIEW OF SYSTEMS:  A comprehensive review of systems was negative except for: Constitutional: positive for fatigue   PHYSICAL EXAMINATION: General appearance: alert, cooperative, fatigued and no distress Head: Normocephalic, without obvious abnormality, atraumatic, Right eyelid droop Neck: no adenopathy, no JVD, supple, symmetrical, trachea midline and thyroid not enlarged, symmetric, no tenderness/mass/nodules Lymph nodes: Cervical, supraclavicular, and axillary nodes normal. Resp: clear to auscultation bilaterally Back: symmetric, no curvature. ROM normal. No CVA tenderness. Cardio: regular rate and rhythm, S1, S2 normal, no murmur, click, rub or gallop GI: soft, non-tender; bowel sounds normal; no masses,  no organomegaly Extremities: extremities normal, atraumatic, no cyanosis or edema  ECOG PERFORMANCE STATUS: 1 - Symptomatic but completely ambulatory  Blood pressure 123/83, pulse 94, temperature (!) 97.4 F (36.3 C), temperature source Tympanic, resp. rate 20, height $RemoveBe'6\' 2"'uxdLOhCQx$  (1.88 m), weight 182 lb 4.8 oz (82.7 kg), SpO2 96 %.  LABORATORY DATA: Lab Results  Component Value Date   WBC 8.2 08/24/2020   HGB 10.6 (L) 08/24/2020   HCT 33.2 (L) 08/24/2020   MCV 97.6 08/24/2020   PLT 351 08/24/2020      Chemistry      Component Value Date/Time   NA 140 08/10/2020 0846   K 4.3 08/10/2020 0846   CL 104 08/10/2020 0846   CO2 25  08/10/2020 0846   BUN 16 08/10/2020 0846   CREATININE 1.08 08/10/2020 0846   CREATININE 1.13 10/16/2013 1118      Component Value Date/Time   CALCIUM 9.1 08/10/2020 0846   ALKPHOS 69 08/10/2020 0846   AST 42 (H) 08/10/2020 0846   ALT 53 (H) 08/10/2020 0846   BILITOT 0.5 08/10/2020 0846       RADIOGRAPHIC STUDIES: CUP PACEART REMOTE DEVICE CHECK  Result Date: 08/08/2020 ILR summary report received. Battery status OK. Normal device function. No new symptom, tachy, brady, or pause episodes. 10 new AF episodes longest duration > 99hrs.  A burden 29.3%.  Ventricular histogram controlled. Known PAF.  [place] - Eliquis.  Monthly summary reports and ROV/PRN.  RP   ASSESSMENT AND PLAN: This is a very pleasant 70 years old white male recently diagnosed with a stage IV (T3, N2, M1 a) non-small cell lung cancer, adenocarcinoma presented with large right upper lobe lung mass with extension to the right suprahilar region as well as right hilar and mediastinal lymphadenopathy with  obstruction of the right upper lobe and bilateral pulmonary nodules diagnosed in July 2021. The molecular study shows no actionable mutations and PD-L1 expression is negative. He had a short course of palliative radiotherapy to the obstructive lung mass under the care of Dr. Lisbeth Renshaw. The patient underwent systemic chemotherapy with carboplatin for AUC of 5, Alimta 500 mg/M2 and Keytruda 200 mg IV every 3 weeks status post 9 cycles.  Starting from cycle #5 he will be on maintenance treatment with Alimta and Keytruda every 3 weeks.  He has been tolerating this treatment well with no concerning complaints except for fatigue and cough.  This treatment was discontinued secondary to disease progression. The patient underwent palliative second line systemic chemotherapy with docetaxel 75 mg/M2 and Cyramza 10 mg/KG every 3 weeks with Neulasta support.  Started May 18, 2020.  Status post 2 cycles. Unfortunately his scan showed evidence for  disease progression and multiple bilateral pulmonary nodules but stable disease in the dominant mass and right pleural effusion. He is currently undergoing palliative systemic chemotherapy with single agent gemcitabine 1000 MG/M2 IV on days 1 and 8 every 3 weeks.  Status post 2 cycles. I recommended for the patient to proceed with cycle #3 today as planned. For the anemia of neoplastic disease, he received 2 units of PRBCs transfusion recently and he feels much better. I will see him back for follow-up visit in 3 weeks for evaluation with repeat CT scan of the chest, abdomen pelvis for restaging of his disease. He was advised to call immediately if he has any other concerning symptoms in the interval.  The patient voices understanding of current disease status and treatment options and is in agreement with the current care plan.  All questions were answered. The patient knows to call the clinic with any problems, questions or concerns. We can certainly see the patient much sooner if necessary.  Disclaimer: This note was dictated with voice recognition software. Similar sounding words can inadvertently be transcribed and may not be corrected upon review.

## 2020-08-31 ENCOUNTER — Inpatient Hospital Stay: Payer: Medicare HMO

## 2020-08-31 ENCOUNTER — Inpatient Hospital Stay: Payer: Medicare HMO | Attending: Internal Medicine

## 2020-08-31 ENCOUNTER — Other Ambulatory Visit: Payer: Self-pay

## 2020-08-31 VITALS — BP 132/80 | HR 87 | Temp 98.2°F | Resp 18 | Wt 184.0 lb

## 2020-08-31 DIAGNOSIS — Z79899 Other long term (current) drug therapy: Secondary | ICD-10-CM | POA: Insufficient documentation

## 2020-08-31 DIAGNOSIS — Z5111 Encounter for antineoplastic chemotherapy: Secondary | ICD-10-CM | POA: Diagnosis present

## 2020-08-31 DIAGNOSIS — D649 Anemia, unspecified: Secondary | ICD-10-CM | POA: Diagnosis not present

## 2020-08-31 DIAGNOSIS — C3411 Malignant neoplasm of upper lobe, right bronchus or lung: Secondary | ICD-10-CM

## 2020-08-31 LAB — CBC WITH DIFFERENTIAL (CANCER CENTER ONLY)
Abs Immature Granulocytes: 0.17 10*3/uL — ABNORMAL HIGH (ref 0.00–0.07)
Basophils Absolute: 0.1 10*3/uL (ref 0.0–0.1)
Basophils Relative: 3 %
Eosinophils Absolute: 0 10*3/uL (ref 0.0–0.5)
Eosinophils Relative: 0 %
HCT: 32.9 % — ABNORMAL LOW (ref 39.0–52.0)
Hemoglobin: 10.7 g/dL — ABNORMAL LOW (ref 13.0–17.0)
Immature Granulocytes: 7 %
Lymphocytes Relative: 26 %
Lymphs Abs: 0.6 10*3/uL — ABNORMAL LOW (ref 0.7–4.0)
MCH: 31.7 pg (ref 26.0–34.0)
MCHC: 32.5 g/dL (ref 30.0–36.0)
MCV: 97.3 fL (ref 80.0–100.0)
Monocytes Absolute: 0.5 10*3/uL (ref 0.1–1.0)
Monocytes Relative: 22 %
Neutro Abs: 1 10*3/uL — ABNORMAL LOW (ref 1.7–7.7)
Neutrophils Relative %: 42 %
Platelet Count: 313 10*3/uL (ref 150–400)
RBC: 3.38 MIL/uL — ABNORMAL LOW (ref 4.22–5.81)
RDW: 17.7 % — ABNORMAL HIGH (ref 11.5–15.5)
WBC Count: 2.4 10*3/uL — ABNORMAL LOW (ref 4.0–10.5)
nRBC: 1.2 % — ABNORMAL HIGH (ref 0.0–0.2)

## 2020-08-31 LAB — CMP (CANCER CENTER ONLY)
ALT: 62 U/L — ABNORMAL HIGH (ref 0–44)
AST: 44 U/L — ABNORMAL HIGH (ref 15–41)
Albumin: 2.9 g/dL — ABNORMAL LOW (ref 3.5–5.0)
Alkaline Phosphatase: 80 U/L (ref 38–126)
Anion gap: 13 (ref 5–15)
BUN: 13 mg/dL (ref 8–23)
CO2: 22 mmol/L (ref 22–32)
Calcium: 9.4 mg/dL (ref 8.9–10.3)
Chloride: 105 mmol/L (ref 98–111)
Creatinine: 0.98 mg/dL (ref 0.61–1.24)
GFR, Estimated: >60 mL/min (ref >60)
Glucose, Bld: 110 mg/dL — ABNORMAL HIGH (ref 70–99)
Potassium: 3.9 mmol/L (ref 3.5–5.1)
Sodium: 140 mmol/L (ref 135–145)
Total Bilirubin: 0.7 mg/dL (ref 0.3–1.2)
Total Protein: 6.7 g/dL (ref 6.5–8.1)

## 2020-08-31 MED ORDER — SODIUM CHLORIDE 0.9 % IV SOLN
Freq: Once | INTRAVENOUS | Status: AC
  Filled 2020-08-31: qty 250

## 2020-08-31 MED ORDER — PROCHLORPERAZINE MALEATE 10 MG PO TABS
10.0000 mg | ORAL_TABLET | Freq: Once | ORAL | Status: AC
  Administered 2020-08-31: 10 mg via ORAL

## 2020-08-31 MED ORDER — PROCHLORPERAZINE MALEATE 10 MG PO TABS
ORAL_TABLET | ORAL | Status: AC
  Filled 2020-08-31: qty 1

## 2020-08-31 MED ORDER — SODIUM CHLORIDE 0.9 % IV SOLN
1000.0000 mg/m2 | Freq: Once | INTRAVENOUS | Status: AC
  Administered 2020-08-31: 2052 mg via INTRAVENOUS
  Filled 2020-08-31: qty 53.97

## 2020-08-31 NOTE — Progress Notes (Signed)
Per Dr. Julien Nordmann ok to treat with ANC 1.0

## 2020-08-31 NOTE — Patient Instructions (Signed)
[facility]  Discharge Instructions: Thank you for choosing [facility] to provide your oncology and hematology care.   If you have a lab appointment with the [facility], please go directly to the [facility] and check in at the registration area.   Wear comfortable clothing and clothing appropriate for easy access to any Portacath or PICC line.   We strive to give you quality time with your provider. You may need to reschedule your appointment if you arrive late (15 or more minutes).  Arriving late affects you and other patients whose appointments are after yours.  Also, if you miss three or more appointments without notifying the office, you may be dismissed from the clinic at the provider's discretion.      For prescription refill requests, have your pharmacy contact our office and allow 72 hours for refills to be completed.    Today you received the following chemotherapy and/or immunotherapy agents gemcitabine      To help prevent nausea and vomiting after your treatment, we encourage you to take your nausea medication as directed.  BELOW ARE SYMPTOMS THAT SHOULD BE REPORTED IMMEDIATELY: . *FEVER GREATER THAN 100.4 F (38 C) OR HIGHER . *CHILLS OR SWEATING . *NAUSEA AND VOMITING THAT IS NOT CONTROLLED WITH YOUR NAUSEA MEDICATION . *UNUSUAL SHORTNESS OF BREATH . *UNUSUAL BRUISING OR BLEEDING . *URINARY PROBLEMS (pain or burning when urinating, or frequent urination) . *BOWEL PROBLEMS (unusual diarrhea, constipation, pain near the anus) . TENDERNESS IN MOUTH AND THROAT WITH OR WITHOUT PRESENCE OF ULCERS (sore throat, sores in mouth, or a toothache) . UNUSUAL RASH, SWELLING OR PAIN  . UNUSUAL VAGINAL DISCHARGE OR ITCHING   Items with * indicate a potential emergency and should be followed up as soon as possible or go to the Emergency Department if any problems should occur.  Please show the CHEMOTHERAPY ALERT CARD or IMMUNOTHERAPY ALERT  CARD at check-in to the Emergency Department and triage nurse.  Should you have questions after your visit or need to cancel or reschedule your appointment, please contact [facility]  Dept: [phone]  and follow the prompts.  Office hours are 8:00 a.m. to 4:30 p.m. Monday - Friday. Please note that voicemails left after 4:00 p.m. may not be returned until the following business day.  We are closed weekends and major holidays. You have access to a nurse at all times for urgent questions. Please call the main number to the clinic Dept: [phone] and follow the prompts.   For any non-urgent questions, you may also contact your provider using MyChart. We now offer e-Visits for anyone 36 and older to request care online for non-urgent symptoms. For details visit [email or web address].   Also download the MyChart app! Go to the app store, search "MyChart", open the app, select [facility], and log in with your MyChart username and password.  Due to Covid, a mask is required upon entering the hospital/clinic. If you do not have a mask, one will be given to you upon arrival. For doctor visits, patients may have 1 support person aged 40 or older with them. For treatment visits, patients cannot have anyone with them due to current Covid guidelines and our immunocompromised population.

## 2020-09-12 ENCOUNTER — Ambulatory Visit (HOSPITAL_COMMUNITY)
Admission: RE | Admit: 2020-09-12 | Discharge: 2020-09-12 | Disposition: A | Discharge status: Home / Self Care | Payer: Medicare HMO | Source: Ambulatory Visit | Attending: Internal Medicine | Admitting: Internal Medicine

## 2020-09-12 ENCOUNTER — Other Ambulatory Visit: Payer: Self-pay

## 2020-09-12 DIAGNOSIS — K5730 Diverticulosis of large intestine without perforation or abscess without bleeding: Secondary | ICD-10-CM | POA: Diagnosis not present

## 2020-09-12 DIAGNOSIS — C3490 Malignant neoplasm of unspecified part of unspecified bronchus or lung: Secondary | ICD-10-CM | POA: Diagnosis not present

## 2020-09-12 DIAGNOSIS — N200 Calculus of kidney: Secondary | ICD-10-CM | POA: Diagnosis not present

## 2020-09-12 DIAGNOSIS — J929 Pleural plaque without asbestos: Secondary | ICD-10-CM | POA: Diagnosis not present

## 2020-09-12 DIAGNOSIS — J90 Pleural effusion, not elsewhere classified: Secondary | ICD-10-CM | POA: Diagnosis not present

## 2020-09-12 DIAGNOSIS — N210 Calculus in bladder: Secondary | ICD-10-CM | POA: Diagnosis not present

## 2020-09-12 DIAGNOSIS — M47816 Spondylosis without myelopathy or radiculopathy, lumbar region: Secondary | ICD-10-CM | POA: Diagnosis not present

## 2020-09-12 DIAGNOSIS — J9811 Atelectasis: Secondary | ICD-10-CM | POA: Diagnosis not present

## 2020-09-12 MED ORDER — SODIUM CHLORIDE (PF) 0.9 % IJ SOLN
INTRAMUSCULAR | Status: AC
  Filled 2020-09-12: qty 50

## 2020-09-12 MED ORDER — IOHEXOL 300 MG/ML  SOLN
100.0000 mL | Freq: Once | INTRAMUSCULAR | Status: AC | PRN
  Administered 2020-09-12: 100 mL via INTRAVENOUS

## 2020-09-14 ENCOUNTER — Other Ambulatory Visit: Payer: Self-pay

## 2020-09-14 ENCOUNTER — Inpatient Hospital Stay: Payer: Medicare HMO | Admitting: Nutrition

## 2020-09-14 ENCOUNTER — Inpatient Hospital Stay: Payer: Medicare HMO

## 2020-09-14 ENCOUNTER — Encounter: Payer: Self-pay | Admitting: Internal Medicine

## 2020-09-14 ENCOUNTER — Inpatient Hospital Stay (HOSPITAL_BASED_OUTPATIENT_CLINIC_OR_DEPARTMENT_OTHER): Payer: Medicare HMO | Admitting: Internal Medicine

## 2020-09-14 VITALS — BP 145/82 | HR 73 | Temp 97.9°F | Resp 20 | Ht 74.0 in | Wt 182.7 lb

## 2020-09-14 DIAGNOSIS — Z5111 Encounter for antineoplastic chemotherapy: Secondary | ICD-10-CM | POA: Diagnosis not present

## 2020-09-14 DIAGNOSIS — Z79899 Other long term (current) drug therapy: Secondary | ICD-10-CM | POA: Diagnosis not present

## 2020-09-14 DIAGNOSIS — I10 Essential (primary) hypertension: Secondary | ICD-10-CM

## 2020-09-14 DIAGNOSIS — C3411 Malignant neoplasm of upper lobe, right bronchus or lung: Secondary | ICD-10-CM

## 2020-09-14 DIAGNOSIS — D649 Anemia, unspecified: Secondary | ICD-10-CM | POA: Diagnosis not present

## 2020-09-14 LAB — CBC WITH DIFFERENTIAL (CANCER CENTER ONLY)
Abs Immature Granulocytes: 0.04 10*3/uL (ref 0.00–0.07)
Basophils Absolute: 0 10*3/uL (ref 0.0–0.1)
Basophils Relative: 1 %
Eosinophils Absolute: 0.1 10*3/uL (ref 0.0–0.5)
Eosinophils Relative: 1 %
HCT: 31 % — ABNORMAL LOW (ref 39.0–52.0)
Hemoglobin: 10 g/dL — ABNORMAL LOW (ref 13.0–17.0)
Immature Granulocytes: 1 %
Lymphocytes Relative: 10 %
Lymphs Abs: 0.6 10*3/uL — ABNORMAL LOW (ref 0.7–4.0)
MCH: 31.9 pg (ref 26.0–34.0)
MCHC: 32.3 g/dL (ref 30.0–36.0)
MCV: 99 fL (ref 80.0–100.0)
Monocytes Absolute: 0.8 10*3/uL (ref 0.1–1.0)
Monocytes Relative: 13 %
Neutro Abs: 4.4 10*3/uL (ref 1.7–7.7)
Neutrophils Relative %: 74 %
Platelet Count: 304 10*3/uL (ref 150–400)
RBC: 3.13 MIL/uL — ABNORMAL LOW (ref 4.22–5.81)
RDW: 20.7 % — ABNORMAL HIGH (ref 11.5–15.5)
WBC Count: 5.9 10*3/uL (ref 4.0–10.5)
nRBC: 0 % (ref 0.0–0.2)

## 2020-09-14 LAB — CMP (CANCER CENTER ONLY)
ALT: 27 U/L (ref 0–44)
AST: 19 U/L (ref 15–41)
Albumin: 3.1 g/dL — ABNORMAL LOW (ref 3.5–5.0)
Alkaline Phosphatase: 87 U/L (ref 38–126)
Anion gap: 12 (ref 5–15)
BUN: 14 mg/dL (ref 8–23)
CO2: 24 mmol/L (ref 22–32)
Calcium: 9.4 mg/dL (ref 8.9–10.3)
Chloride: 107 mmol/L (ref 98–111)
Creatinine: 1.01 mg/dL (ref 0.61–1.24)
GFR, Estimated: >60 mL/min (ref >60)
Glucose, Bld: 119 mg/dL — ABNORMAL HIGH (ref 70–99)
Potassium: 4.1 mmol/L (ref 3.5–5.1)
Sodium: 143 mmol/L (ref 135–145)
Total Bilirubin: 0.7 mg/dL (ref 0.3–1.2)
Total Protein: 6.7 g/dL (ref 6.5–8.1)

## 2020-09-14 LAB — TSH: TSH: 1.629 u[IU]/mL (ref 0.320–4.118)

## 2020-09-14 MED ORDER — PROCHLORPERAZINE MALEATE 10 MG PO TABS
ORAL_TABLET | ORAL | Status: AC
  Filled 2020-09-14: qty 1

## 2020-09-14 MED ORDER — SODIUM CHLORIDE 0.9 % IV SOLN
Freq: Once | INTRAVENOUS | Status: AC
  Filled 2020-09-14: qty 250

## 2020-09-14 MED ORDER — PROCHLORPERAZINE MALEATE 10 MG PO TABS
10.0000 mg | ORAL_TABLET | Freq: Once | ORAL | Status: AC
  Administered 2020-09-14: 10 mg via ORAL

## 2020-09-14 MED ORDER — SODIUM CHLORIDE 0.9 % IV SOLN
1000.0000 mg/m2 | Freq: Once | INTRAVENOUS | Status: AC
  Administered 2020-09-14: 2052 mg via INTRAVENOUS
  Filled 2020-09-14: qty 53.97

## 2020-09-14 NOTE — Progress Notes (Signed)
Ok per Dr. Julien Nordmann for pt to stop taking Folic Acid now that he no longer receives Alimta.  Kennith Center, Pharm.D., CPP 09/14/2020@11 :20 AM

## 2020-09-14 NOTE — Progress Notes (Signed)
Nutrition follow-up completed with patient during infusion for lung cancer.   Weight is stable at 182.7 pounds on June 15 from 182.7 pounds May 4. Noted labs: Glucose 119 and albumin 3.1.  Taste alterations continue.  Appetite seems variable.  Patient reports he cannot swallow dry foods and must take a sip of water after each bite.  He has occasional loose stools.  States these are manageable.  Reports he is started to get strawberry and banana smoothies with yogurt in them and he enjoys these.  He drinks whole milk and eats ice cream.  He has met with his dietitian at his place of residence.  He is pleased with weight maintenance.  Nutrition diagnosis: Unintended weight loss resolved.  Provided support and encouragement.  Patient will continue to work to maintain weight.  He has contact information for questions or concerns.  No follow-up is scheduled so if patient requires additional nutrition follow-up please consult.  **Disclaimer: This note was dictated with voice recognition software. Similar sounding words can inadvertently be transcribed and this note may contain transcription errors which may not have been corrected upon publication of note.**

## 2020-09-14 NOTE — Patient Instructions (Signed)
[facility]  Discharge Instructions: Thank you for choosing [facility] to provide your oncology and hematology care.   If you have a lab appointment with the [facility], please go directly to the [facility] and check in at the registration area.   Wear comfortable clothing and clothing appropriate for easy access to any Portacath or PICC line.   We strive to give you quality time with your provider. You may need to reschedule your appointment if you arrive late (15 or more minutes).  Arriving late affects you and other patients whose appointments are after yours.  Also, if you miss three or more appointments without notifying the office, you may be dismissed from the clinic at the provider's discretion.      For prescription refill requests, have your pharmacy contact our office and allow 72 hours for refills to be completed.    Today you received the following chemotherapy and/or immunotherapy agents gemcitabine      To help prevent nausea and vomiting after your treatment, we encourage you to take your nausea medication as directed.  BELOW ARE SYMPTOMS THAT SHOULD BE REPORTED IMMEDIATELY: *FEVER GREATER THAN 100.4 F (38 C) OR HIGHER *CHILLS OR SWEATING *NAUSEA AND VOMITING THAT IS NOT CONTROLLED WITH YOUR NAUSEA MEDICATION *UNUSUAL SHORTNESS OF BREATH *UNUSUAL BRUISING OR BLEEDING *URINARY PROBLEMS (pain or burning when urinating, or frequent urination) *BOWEL PROBLEMS (unusual diarrhea, constipation, pain near the anus) TENDERNESS IN MOUTH AND THROAT WITH OR WITHOUT PRESENCE OF ULCERS (sore throat, sores in mouth, or a toothache) UNUSUAL RASH, SWELLING OR PAIN  UNUSUAL VAGINAL DISCHARGE OR ITCHING   Items with * indicate a potential emergency and should be followed up as soon as possible or go to the Emergency Department if any problems should occur.  Please show the CHEMOTHERAPY ALERT CARD or IMMUNOTHERAPY ALERT CARD at check-in to  the Emergency Department and triage nurse.  Should you have questions after your visit or need to cancel or reschedule your appointment, please contact [facility]  Dept: [phone]  and follow the prompts.  Office hours are 8:00 a.m. to 4:30 p.m. Monday - Friday. Please note that voicemails left after 4:00 p.m. may not be returned until the following business day.  We are closed weekends and major holidays. You have access to a nurse at all times for urgent questions. Please call the main number to the clinic Dept: [phone] and follow the prompts.   For any non-urgent questions, you may also contact your provider using MyChart. We now offer e-Visits for anyone 73 and older to request care online for non-urgent symptoms. For details visit [email or web address].   Also download the MyChart app! Go to the app store, search "MyChart", open the app, select [facility], and log in with your MyChart username and password.  Due to Covid, a mask is required upon entering the hospital/clinic. If you do not have a mask, one will be given to you upon arrival. For doctor visits, patients may have 1 support person aged 3 or older with them. For treatment visits, patients cannot have anyone with them due to current Covid guidelines and our immunocompromised population.

## 2020-09-14 NOTE — Progress Notes (Signed)
[facility] [phone]) [phone]   [phone]  OFFICE PROGRESS NOTE  Copland, Gay Filler, MD [address] [place] [address]  DIAGNOSIS: Stage IV (T3, N2, M1a) non-small cell lung cancer, adenocarcinoma presented with large right upper lobe lung mass extending to the right suprahilar region in addition to right hilar and mediastinal lymphadenopathy with obstruction of the associated upper lobe bronchus and bilateral subcentimeter pulmonary nodules diagnosed in July 2021.  Biomarker Findings Microsatellite status - MS-Stable Tumor Mutational Burden - 3 Muts/Mb Genomic Findings For a complete list of the genes assayed, please refer to the Appendix. ERBB2 [id number], amplification - equivocal? HGF amplification - equivocal? MTAP loss exons 5-8 MYC amplification - equivocal? CDKN2A/B CDKN2B loss, CDKN2A loss CUL3 Y51f*19 RPTOR amplification TP53 [id number] 7 Disease relevant genes with no reportable alterations: ALK, BRAF, EGFR, KRAS, MET, RET, ROS1  PDL1 Expression: Negative  PRIOR THERAPY:  1) Palliative radiotherapy to the obstructive lung mass under the care of Dr. MoLisbeth RenshawThe past treatment was scheduled for 11/23/19. 2) Systemic chemotherapy with carboplatin for AUC of 5, Alimta 500 mg/M2 and Keytruda 200 mg IV every 3 weeks.  First dose November 04, 2019.  Status post 9 cycles.  Starting from cycle #5 he will be on maintenance treatment with Alimta and Keytruda every 3 weeks. 3) Second line systemic chemotherapy with docetaxel 75 mg/M2 and Cyramza 10 mg/KG every weeks with Neulasta support.  First dose May 18, 2020.  Status post 2 cycles.  Last dose was given June 17, 2020 discontinued secondary to disease progression.  CURRENT THERAPY: Third line systemic chemotherapy with single agent gemcitabine 1000 mg/M2 on days 1 and 8 every 3 weeks.  First dose July 13, 2020.  Status post 3 cycles.  INTERVAL HISTORY: WiJavonn Gauger89.o. male returns to the clinic today for follow-up visit accompanied by his wife.  The patient is feeling fine today with no concerning complaints except for fatigue and shortness of breath with exertion and wheezing.  He denied having any current chest pain or hemoptysis.  He denied having any fever or chills.  He has no nausea, vomiting, diarrhea or constipation.  He has no headache or visual changes.  He has been tolerating his treatment with single agent gemcitabine fairly well.  The patient had repeat CT scan of the chest, abdomen pelvis performed recently and he is here for evaluation and discussion of his discuss results.   MEDICAL HISTORY: Past Medical History:  Diagnosis Date   Chicken pox    DM2 (diabetes mellitus, type 2) ([facility]   History of kidney stones    passed several    Hypertension    Kidney stones    5-6 times   Measles    Mumps    [place]   [place], last one was 09/18/2019- has some expressive aphasia    ALLERGIES:  is allergic to bee venom.  MEDICATIONS:  Current Outpatient Medications  Medication Sig Dispense Refill   albuterol (VENTOLIN HFA) 108 (90 Base) MCG/ACT inhaler Inhale 2 puffs into the lungs every 6 (six) hours as needed for wheezing or shortness of breath. 8 g 2   apixaban (ELIQUIS) 5 MG TABS tablet Take 1 tablet (5 mg total) by mouth 2 (two) times daily. 60 tablet 11   atorvastatin (LIPITOR) 40 MG tablet Take 1 tablet (40 mg total) by mouth daily at 6 PM. 90 tablet 3   diltiazem (CARDIZEM CD)  180 MG 24 hr capsule Take 1 capsule (180 mg total) by mouth daily. 30 capsule 6   folic acid (FOLVITE) 1 MG tablet Take 1 tablet (1 mg total) by mouth daily. 90 tablet 3   metFORMIN (GLUCOPHAGE) 500 MG tablet TAKE TWO TABLETS BY MOUTH TWICE A DAY WITH MEAL(S) 360 tablet 3   Omega-3 Fatty Acids (FISH OIL) 1200 MG CAPS Take by mouth daily.     acetaminophen (TYLENOL) 500 MG tablet Take 500 mg by mouth every 6 (six) hours as needed.  (Patient not taking:  Reported on 09/14/2020)     HYDROcodone bit-homatropine (HYCODAN) 5-1.5 MG/5ML syrup Take 5 mLs by mouth every 6 (six) hours as needed for cough. (Patient not taking: Reported on 09/14/2020)     prochlorperazine (COMPAZINE) 10 MG tablet Take 1 tablet (10 mg total) by mouth every 6 (six) hours as needed. (Patient not taking: No sig reported) 30 tablet 2   No current facility-administered medications for this visit.    SURGICAL HISTORY:  Past Surgical History:  Procedure Laterality Date   COLONOSCOPY W/ POLYPECTOMY     VIDEO BRONCHOSCOPY WITH ENDOBRONCHIAL ULTRASOUND N/A 09/30/2019   Procedure: VIDEO BRONCHOSCOPY WITH ENDOBRONCHIAL ULTRASOUND;  Surgeon: Collene Gobble, MD;  Location: [facility];  Service: Thoracic;  Laterality: N/A;   WISDOM TOOTH EXTRACTION  1976-77    REVIEW OF SYSTEMS:  Constitutional: positive for fatigue Eyes: negative Ears, nose, mouth, throat, and face: negative Respiratory: positive for cough, dyspnea on exertion, and wheezing Cardiovascular: negative Gastrointestinal: negative Genitourinary:negative Integument/breast: negative Hematologic/lymphatic: negative Musculoskeletal:negative Neurological: negative Behavioral/Psych: negative Endocrine: negative Allergic/Immunologic: negative   PHYSICAL EXAMINATION: General appearance: alert, cooperative, fatigued, and no distress Head: Normocephalic, without obvious abnormality, atraumatic, Right eyelid droop Neck: no adenopathy, no JVD, supple, symmetrical, trachea midline, and thyroid not enlarged, symmetric, no tenderness/mass/nodules Lymph nodes: Cervical, supraclavicular, and axillary nodes normal. Resp: diminished breath sounds RLL and dullness to percussion RLL Back: symmetric, no curvature. ROM normal. No CVA tenderness. Cardio: regular rate and rhythm, S1, S2 normal, no murmur, click, rub or gallop GI: soft, non-tender; bowel sounds normal; no masses,  no organomegaly Extremities: extremities normal, atraumatic,  no cyanosis or edema Neurologic: Alert and oriented X 3, normal strength and tone. Normal symmetric reflexes. Normal coordination and gait  ECOG PERFORMANCE STATUS: 1 - Symptomatic but completely ambulatory  Blood pressure (!) 145/82, pulse 73, temperature 97.9 F (36.6 C), temperature source Tympanic, resp. rate 20, height _0  (1.88 m), weight 182 lb 11.2 oz (82.9 kg), SpO2 96 %.  LABORATORY DATA: Lab Results  Component Value Date   WBC 5.9 09/14/2020   HGB 10.0 (L) 09/14/2020   HCT 31.0 (L) 09/14/2020   MCV 99.0 09/14/2020   PLT 304 09/14/2020      Chemistry      Component Value Date/Time   NA 143 09/14/2020 0851   K 4.1 09/14/2020 0851   CL 107 09/14/2020 0851   CO2 24 09/14/2020 0851   BUN 14 09/14/2020 0851   CREATININE 1.01 09/14/2020 0851   CREATININE 1.13 10/16/2013 1118      Component Value Date/Time   CALCIUM 9.4 09/14/2020 0851   ALKPHOS 87 09/14/2020 0851   AST 19 09/14/2020 0851   ALT 27 09/14/2020 0851   BILITOT 0.7 09/14/2020 0851       RADIOGRAPHIC STUDIES: CT Chest W Contrast  Result Date: 09/12/2020 CLINICAL DATA:  Non-small cell lung cancer (adenocarcinoma) diagnosed in July 2021. Prior radiation, chemotherapy and immunotherapy. EXAM: CT CHEST,  ABDOMEN, AND PELVIS WITH CONTRAST TECHNIQUE: Multidetector CT imaging of the chest, abdomen and pelvis was performed following the standard protocol during bolus administration of intravenous contrast. CONTRAST:  121m OMNIPAQUE IOHEXOL 300 MG/ML  SOLN COMPARISON:  CT 07/05/2020 and 05/10/2020. FINDINGS: CT CHEST FINDINGS Cardiovascular: No significant vascular findings. The heart size is normal. Trace pericardial fluid. Mediastinum/Nodes: There are no enlarged mediastinal, hilar or axillary lymph nodes. Stable partially calcified 9 mm left thyroid nodule on image 5/2. No followup recommended.(Ref: J Am Coll Radiol. 2015 Feb;12(2): 143-50). The trachea and esophagus appear unremarkable. Lungs/Pleura: Right pleural  effusion has enlarged and is now large in volume. A small to moderate left pleural effusion has also mildly enlarged. No pleural nodularity identified. There is progressive compressive right lower lobe atelectasis. Paramediastinal radiation changes are present, right greater than left without signs of local recurrence. There is mildly progressive central airway thickening on the right. Multiple pulmonary nodules again noted bilaterally, similar to the most recent study. The largest nodules are in the left lower lobe, measuring 1.5 x 1.1 cm on image 100/4 (previously 1.3 x 1.2 cm) and 1.1 x 1.0 cm in the right lower lobe on image 112/4 (previously 0.9 x 0.8 cm. Musculoskeletal/Chest wall: No chest wall mass or suspicious osseous findings. CT ABDOMEN AND PELVIS FINDINGS Hepatobiliary: The liver is normal in density without suspicious focal abnormality. No evidence of gallstones, gallbladder wall thickening or biliary dilatation. Pancreas: Unremarkable. No pancreatic ductal dilatation or surrounding inflammatory changes. Spleen: Normal in size without focal abnormality. Adrenals/Urinary Tract: Both adrenal glands appear normal. Tiny nonobstructing bilateral renal calculi. No evidence of ureteral calculus or hydronephrosis. Both kidneys otherwise appear unremarkable. Large dependent bladder calculus again noted, measuring 1.9 cm. No significant bladder wall thickening. Stomach/Bowel: Enteric contrast was given and has passed into the distal colon. The stomach appears unremarkable for its degree of distension. No evidence of bowel wall thickening, distention or surrounding inflammatory change. There are several duodenal diverticula. The appendix appears normal. There is scattered colonic diverticulosis, greatest within the sigmoid colon. Vascular/Lymphatic: There are no enlarged abdominal or pelvic lymph nodes. Mild aortic and branch vessel atherosclerosis. Reproductive: The prostate gland and seminal vesicles appear  unchanged. Other: Stable mildly prominent fat in both inguinal canals. No ascites or peritoneal nodularity. Musculoskeletal: No acute or significant osseous findings. Lower lumbar spondylosis. IMPRESSION: 1. Compared with the prior CTs of 2 months ago, there are enlarging right greater than left pleural effusions with associated progressive compressive atelectasis of the right lower lobe. These effusions do not demonstrate any malignant features. 2. No significant short interval change in the multiple pulmonary metastases. 3. Evolving radiation changes in the right perihilar region without signs of local recurrence. Continued follow-up recommended. 4. No evidence of metastatic disease in the abdomen or pelvis. 5. Stable incidental abdominal findings including bilateral nephrolithiasis, a large bladder calculus and distal colonic diverticulosis. Electronically Signed   By: WRichardean SaleM.D.   On: 09/12/2020 12:16   CT Abdomen Pelvis W Contrast  Result Date: 09/12/2020 CLINICAL DATA:  Non-small cell lung cancer (adenocarcinoma) diagnosed in July 2021. Prior radiation, chemotherapy and immunotherapy. EXAM: CT CHEST, ABDOMEN, AND PELVIS WITH CONTRAST TECHNIQUE: Multidetector CT imaging of the chest, abdomen and pelvis was performed following the standard protocol during bolus administration of intravenous contrast. CONTRAST:  1060mOMNIPAQUE IOHEXOL 300 MG/ML  SOLN COMPARISON:  CT 07/05/2020 and 05/10/2020. FINDINGS: CT CHEST FINDINGS Cardiovascular: No significant vascular findings. The heart size is normal. Trace pericardial fluid. Mediastinum/Nodes: There  are no enlarged mediastinal, hilar or axillary lymph nodes. Stable partially calcified 9 mm left thyroid nodule on image 5/2. No followup recommended.(Ref: J Am Coll Radiol. 2015 Feb;12(2): 143-50). The trachea and esophagus appear unremarkable. Lungs/Pleura: Right pleural effusion has enlarged and is now large in volume. A small to moderate left pleural  effusion has also mildly enlarged. No pleural nodularity identified. There is progressive compressive right lower lobe atelectasis. Paramediastinal radiation changes are present, right greater than left without signs of local recurrence. There is mildly progressive central airway thickening on the right. Multiple pulmonary nodules again noted bilaterally, similar to the most recent study. The largest nodules are in the left lower lobe, measuring 1.5 x 1.1 cm on image 100/4 (previously 1.3 x 1.2 cm) and 1.1 x 1.0 cm in the right lower lobe on image 112/4 (previously 0.9 x 0.8 cm. Musculoskeletal/Chest wall: No chest wall mass or suspicious osseous findings. CT ABDOMEN AND PELVIS FINDINGS Hepatobiliary: The liver is normal in density without suspicious focal abnormality. No evidence of gallstones, gallbladder wall thickening or biliary dilatation. Pancreas: Unremarkable. No pancreatic ductal dilatation or surrounding inflammatory changes. Spleen: Normal in size without focal abnormality. Adrenals/Urinary Tract: Both adrenal glands appear normal. Tiny nonobstructing bilateral renal calculi. No evidence of ureteral calculus or hydronephrosis. Both kidneys otherwise appear unremarkable. Large dependent bladder calculus again noted, measuring 1.9 cm. No significant bladder wall thickening. Stomach/Bowel: Enteric contrast was given and has passed into the distal colon. The stomach appears unremarkable for its degree of distension. No evidence of bowel wall thickening, distention or surrounding inflammatory change. There are several duodenal diverticula. The appendix appears normal. There is scattered colonic diverticulosis, greatest within the sigmoid colon. Vascular/Lymphatic: There are no enlarged abdominal or pelvic lymph nodes. Mild aortic and branch vessel atherosclerosis. Reproductive: The prostate gland and seminal vesicles appear unchanged. Other: Stable mildly prominent fat in both inguinal canals. No ascites or  peritoneal nodularity. Musculoskeletal: No acute or significant osseous findings. Lower lumbar spondylosis. IMPRESSION: 1. Compared with the prior CTs of 2 months ago, there are enlarging right greater than left pleural effusions with associated progressive compressive atelectasis of the right lower lobe. These effusions do not demonstrate any malignant features. 2. No significant short interval change in the multiple pulmonary metastases. 3. Evolving radiation changes in the right perihilar region without signs of local recurrence. Continued follow-up recommended. 4. No evidence of metastatic disease in the abdomen or pelvis. 5. Stable incidental abdominal findings including bilateral nephrolithiasis, a large bladder calculus and distal colonic diverticulosis. Electronically Signed   By: Richardean Sale M.D.   On: 09/12/2020 12:16     ASSESSMENT AND PLAN: This is a very pleasant 70 years old white male recently diagnosed with a stage IV (T3, N2, M1 a) non-small cell lung cancer, adenocarcinoma presented with large right upper lobe lung mass with extension to the right suprahilar region as well as right hilar and mediastinal lymphadenopathy with obstruction of the right upper lobe and bilateral pulmonary nodules diagnosed in July 2021. The molecular study shows no actionable mutations and PD-L1 expression is negative. He had a short course of palliative radiotherapy to the obstructive lung mass under the care of Dr. Lisbeth Renshaw. The patient underwent systemic chemotherapy with carboplatin for AUC of 5, Alimta 500 mg/M2 and Keytruda 200 mg IV every 3 weeks status post 9 cycles.  Starting from cycle #5 he will be on maintenance treatment with Alimta and Keytruda every 3 weeks.  He has been tolerating this treatment well  with no concerning complaints except for fatigue and cough.  This treatment was discontinued secondary to disease progression. The patient underwent palliative second line systemic chemotherapy with  docetaxel 75 mg/M2 and Cyramza 10 mg/KG every 3 weeks with Neulasta support.  Started May 18, 2020.  Status post 2 cycles. Unfortunately his scan showed evidence for disease progression and multiple bilateral pulmonary nodules but stable disease in the dominant mass and right pleural effusion. He is currently undergoing palliative systemic chemotherapy with single agent gemcitabine 1000 MG/M2 IV on days 1 and 8 every 3 weeks.  Status post 3 cycles. The patient continues to tolerate this treatment well with no concerning complaints except for mild fatigue. He had repeat CT scan of the chest, abdomen pelvis performed recently.  I personally and independently reviewed the scan images and discussed the result and showed the images to the patient and his wife. His scan showed no significant evidence for disease progression except for enlargement of bilateral pleural effusion more on the right side. I recommended for the patient to continue his current treatment with single agent gemcitabine as planned and he will proceed with cycle #4 today. For the enlarged right pleural effusion, I will arrange for the patient to have diagnostic and therapeutic ultrasound-guided right thoracentesis. The patient will come back for follow-up visit in 3 weeks for evaluation with the start of cycle #5. For the anemia, we will continue to monitor his hemoglobin and hematocrit closely and consider The patient for transfusion if needed. He was advised to call immediately if he has any other concerning symptoms in the interval.  The patient voices understanding of current disease status and treatment options and is in agreement with the current care plan.  All questions were answered. The patient knows to call the clinic with any problems, questions or concerns. We can certainly see the patient much sooner if necessary.  Disclaimer: This note was dictated with voice recognition software. Similar sounding words can  inadvertently be transcribed and may not be corrected upon review.

## 2020-09-15 ENCOUNTER — Other Ambulatory Visit: Payer: Self-pay | Admitting: Family Medicine

## 2020-09-15 DIAGNOSIS — E079 Disorder of thyroid, unspecified: Secondary | ICD-10-CM

## 2020-09-19 ENCOUNTER — Ambulatory Visit (HOSPITAL_COMMUNITY)
Admission: RE | Admit: 2020-09-19 | Discharge: 2020-09-19 | Disposition: A | Discharge status: Home / Self Care | Payer: Medicare HMO | Source: Ambulatory Visit | Attending: Internal Medicine | Admitting: Internal Medicine

## 2020-09-19 ENCOUNTER — Ambulatory Visit (HOSPITAL_COMMUNITY)
Admission: RE | Admit: 2020-09-19 | Discharge: 2020-09-19 | Disposition: A | Discharge status: Home / Self Care | Payer: Medicare HMO | Source: Ambulatory Visit | Attending: Radiology | Admitting: Radiology

## 2020-09-19 ENCOUNTER — Other Ambulatory Visit: Payer: Self-pay

## 2020-09-19 DIAGNOSIS — C3411 Malignant neoplasm of upper lobe, right bronchus or lung: Secondary | ICD-10-CM

## 2020-09-19 DIAGNOSIS — J9811 Atelectasis: Secondary | ICD-10-CM | POA: Diagnosis not present

## 2020-09-19 DIAGNOSIS — J90 Pleural effusion, not elsewhere classified: Secondary | ICD-10-CM | POA: Insufficient documentation

## 2020-09-19 DIAGNOSIS — J948 Other specified pleural conditions: Secondary | ICD-10-CM | POA: Diagnosis not present

## 2020-09-19 DIAGNOSIS — Z9889 Other specified postprocedural states: Secondary | ICD-10-CM

## 2020-09-19 MED ORDER — LIDOCAINE HCL 1 % IJ SOLN
INTRAMUSCULAR | Status: AC
  Filled 2020-09-19: qty 20

## 2020-09-19 NOTE — Procedures (Signed)
Ultrasound-guided diagnostic and therapeutic right sided thoracentesis performed yielding 1.75 liters of yellow-brown colored fluid. No immediate complications.   Diagnostic fluid was sent to the lab for further analysis. Follow-up chest x-ray pending. EBL is < 2 ml.

## 2020-09-20 LAB — CYTOLOGY - NON PAP

## 2020-09-21 ENCOUNTER — Inpatient Hospital Stay: Payer: Medicare HMO

## 2020-09-21 ENCOUNTER — Other Ambulatory Visit: Payer: Self-pay

## 2020-09-21 DIAGNOSIS — C3411 Malignant neoplasm of upper lobe, right bronchus or lung: Secondary | ICD-10-CM

## 2020-09-21 DIAGNOSIS — Z79899 Other long term (current) drug therapy: Secondary | ICD-10-CM | POA: Diagnosis not present

## 2020-09-21 DIAGNOSIS — Z5111 Encounter for antineoplastic chemotherapy: Secondary | ICD-10-CM | POA: Diagnosis not present

## 2020-09-21 DIAGNOSIS — D649 Anemia, unspecified: Secondary | ICD-10-CM | POA: Diagnosis not present

## 2020-09-21 LAB — CMP (CANCER CENTER ONLY)
ALT: 80 U/L — ABNORMAL HIGH (ref 0–44)
AST: 62 U/L — ABNORMAL HIGH (ref 15–41)
Albumin: 3 g/dL — ABNORMAL LOW (ref 3.5–5.0)
Alkaline Phosphatase: 85 U/L (ref 38–126)
Anion gap: 12 (ref 5–15)
BUN: 13 mg/dL (ref 8–23)
CO2: 21 mmol/L — ABNORMAL LOW (ref 22–32)
Calcium: 9.2 mg/dL (ref 8.9–10.3)
Chloride: 105 mmol/L (ref 98–111)
Creatinine: 1.05 mg/dL (ref 0.61–1.24)
GFR, Estimated: >60 mL/min (ref >60)
Glucose, Bld: 103 mg/dL — ABNORMAL HIGH (ref 70–99)
Potassium: 3.9 mmol/L (ref 3.5–5.1)
Sodium: 138 mmol/L (ref 135–145)
Total Bilirubin: 0.8 mg/dL (ref 0.3–1.2)
Total Protein: 6.5 g/dL (ref 6.5–8.1)

## 2020-09-21 LAB — CBC WITH DIFFERENTIAL (CANCER CENTER ONLY)
Abs Immature Granulocytes: 0.12 10*3/uL — ABNORMAL HIGH (ref 0.00–0.07)
Basophils Absolute: 0.1 10*3/uL (ref 0.0–0.1)
Basophils Relative: 4 %
Eosinophils Absolute: 0 10*3/uL (ref 0.0–0.5)
Eosinophils Relative: 1 %
HCT: 30.1 % — ABNORMAL LOW (ref 39.0–52.0)
Hemoglobin: 9.8 g/dL — ABNORMAL LOW (ref 13.0–17.0)
Immature Granulocytes: 7 %
Lymphocytes Relative: 30 %
Lymphs Abs: 0.5 10*3/uL — ABNORMAL LOW (ref 0.7–4.0)
MCH: 32.2 pg (ref 26.0–34.0)
MCHC: 32.6 g/dL (ref 30.0–36.0)
MCV: 99 fL (ref 80.0–100.0)
Monocytes Absolute: 0.2 10*3/uL (ref 0.1–1.0)
Monocytes Relative: 14 %
Neutro Abs: 0.7 10*3/uL — ABNORMAL LOW (ref 1.7–7.7)
Neutrophils Relative %: 44 %
Platelet Count: 326 10*3/uL (ref 150–400)
RBC: 3.04 MIL/uL — ABNORMAL LOW (ref 4.22–5.81)
RDW: 19.8 % — ABNORMAL HIGH (ref 11.5–15.5)
WBC Count: 1.6 10*3/uL — ABNORMAL LOW (ref 4.0–10.5)
nRBC: 2.5 % — ABNORMAL HIGH (ref 0.0–0.2)

## 2020-09-21 LAB — LACTATE DEHYDROGENASE: LDH: 194 U/L — ABNORMAL HIGH (ref 98–192)

## 2020-09-21 NOTE — Progress Notes (Signed)
Per Dr. Julien Nordmann - patient will not be receiving treatment today d/t low ANC. Patient is to return in two weeks for scheduled appointments. Patient aware and provided information regarding neutropenic precautions.

## 2020-10-05 ENCOUNTER — Inpatient Hospital Stay: Payer: Medicare HMO

## 2020-10-05 ENCOUNTER — Other Ambulatory Visit: Payer: Self-pay

## 2020-10-05 ENCOUNTER — Other Ambulatory Visit: Payer: Self-pay | Admitting: Internal Medicine

## 2020-10-05 ENCOUNTER — Inpatient Hospital Stay: Payer: Medicare HMO | Attending: Internal Medicine | Admitting: Internal Medicine

## 2020-10-05 VITALS — BP 107/67 | HR 93 | Temp 97.5°F | Resp 18 | Wt 180.1 lb

## 2020-10-05 DIAGNOSIS — Z5111 Encounter for antineoplastic chemotherapy: Secondary | ICD-10-CM | POA: Diagnosis not present

## 2020-10-05 DIAGNOSIS — Z923 Personal history of irradiation: Secondary | ICD-10-CM | POA: Diagnosis not present

## 2020-10-05 DIAGNOSIS — C3411 Malignant neoplasm of upper lobe, right bronchus or lung: Secondary | ICD-10-CM | POA: Diagnosis not present

## 2020-10-05 DIAGNOSIS — Z79899 Other long term (current) drug therapy: Secondary | ICD-10-CM | POA: Diagnosis not present

## 2020-10-05 LAB — CMP (CANCER CENTER ONLY)
ALT: 12 U/L (ref 0–44)
AST: 13 U/L — ABNORMAL LOW (ref 15–41)
Albumin: 3.2 g/dL — ABNORMAL LOW (ref 3.5–5.0)
Alkaline Phosphatase: 84 U/L (ref 38–126)
Anion gap: 9 (ref 5–15)
BUN: 20 mg/dL (ref 8–23)
CO2: 25 mmol/L (ref 22–32)
Calcium: 9.7 mg/dL (ref 8.9–10.3)
Chloride: 105 mmol/L (ref 98–111)
Creatinine: 1.06 mg/dL (ref 0.61–1.24)
GFR, Estimated: >60 mL/min (ref >60)
Glucose, Bld: 109 mg/dL — ABNORMAL HIGH (ref 70–99)
Potassium: 4.4 mmol/L (ref 3.5–5.1)
Sodium: 139 mmol/L (ref 135–145)
Total Bilirubin: 0.9 mg/dL (ref 0.3–1.2)
Total Protein: 6.8 g/dL (ref 6.5–8.1)

## 2020-10-05 LAB — CBC WITH DIFFERENTIAL (CANCER CENTER ONLY)
Abs Immature Granulocytes: 0.05 10*3/uL (ref 0.00–0.07)
Basophils Absolute: 0.1 10*3/uL (ref 0.0–0.1)
Basophils Relative: 1 %
Eosinophils Absolute: 0 10*3/uL (ref 0.0–0.5)
Eosinophils Relative: 0 %
HCT: 33.6 % — ABNORMAL LOW (ref 39.0–52.0)
Hemoglobin: 11.1 g/dL — ABNORMAL LOW (ref 13.0–17.0)
Immature Granulocytes: 1 %
Lymphocytes Relative: 9 %
Lymphs Abs: 0.6 10*3/uL — ABNORMAL LOW (ref 0.7–4.0)
MCH: 33.5 pg (ref 26.0–34.0)
MCHC: 33 g/dL (ref 30.0–36.0)
MCV: 101.5 fL — ABNORMAL HIGH (ref 80.0–100.0)
Monocytes Absolute: 0.9 10*3/uL (ref 0.1–1.0)
Monocytes Relative: 13 %
Neutro Abs: 5.7 10*3/uL (ref 1.7–7.7)
Neutrophils Relative %: 76 %
Platelet Count: 349 10*3/uL (ref 150–400)
RBC: 3.31 MIL/uL — ABNORMAL LOW (ref 4.22–5.81)
RDW: 20.6 % — ABNORMAL HIGH (ref 11.5–15.5)
WBC Count: 7.4 10*3/uL (ref 4.0–10.5)
nRBC: 0 % (ref 0.0–0.2)

## 2020-10-05 MED ORDER — SODIUM CHLORIDE 0.9 % IV SOLN
Freq: Once | INTRAVENOUS | Status: AC
  Filled 2020-10-05: qty 250

## 2020-10-05 MED ORDER — PROCHLORPERAZINE MALEATE 10 MG PO TABS
10.0000 mg | ORAL_TABLET | Freq: Once | ORAL | Status: AC
Start: 2020-10-05 — End: 2020-10-05
  Administered 2020-10-05: 10 mg via ORAL

## 2020-10-05 MED ORDER — SODIUM CHLORIDE 0.9 % IV SOLN
1000.0000 mg/m2 | Freq: Once | INTRAVENOUS | Status: AC
  Administered 2020-10-05: 2052 mg via INTRAVENOUS
  Filled 2020-10-05: qty 53.97

## 2020-10-05 MED ORDER — HYDROCODONE BIT-HOMATROP MBR 5-1.5 MG/5ML PO SOLN: 5.0000 mL | Freq: Four times a day (QID) | ORAL | 0 refills | Status: DC | PRN

## 2020-10-05 MED ORDER — PROCHLORPERAZINE MALEATE 10 MG PO TABS
ORAL_TABLET | ORAL | Status: AC
  Filled 2020-10-05: qty 1

## 2020-10-05 NOTE — Patient Instructions (Signed)
[facility]  Discharge Instructions: Thank you for choosing [facility] to provide your oncology and hematology care.   If you have a lab appointment with the [facility], please go directly to the [facility] and check in at the registration area.   Wear comfortable clothing and clothing appropriate for easy access to any Portacath or PICC line.   We strive to give you quality time with your provider. You may need to reschedule your appointment if you arrive late (15 or more minutes).  Arriving late affects you and other patients whose appointments are after yours.  Also, if you miss three or more appointments without notifying the office, you may be dismissed from the clinic at the provider's discretion.      For prescription refill requests, have your pharmacy contact our office and allow 72 hours for refills to be completed.    Today you received the following chemotherapy and/or immunotherapy agents gemcitabine      To help prevent nausea and vomiting after your treatment, we encourage you to take your nausea medication as directed.  BELOW ARE SYMPTOMS THAT SHOULD BE REPORTED IMMEDIATELY: *FEVER GREATER THAN 100.4 F (38 C) OR HIGHER *CHILLS OR SWEATING *NAUSEA AND VOMITING THAT IS NOT CONTROLLED WITH YOUR NAUSEA MEDICATION *UNUSUAL SHORTNESS OF BREATH *UNUSUAL BRUISING OR BLEEDING *URINARY PROBLEMS (pain or burning when urinating, or frequent urination) *BOWEL PROBLEMS (unusual diarrhea, constipation, pain near the anus) TENDERNESS IN MOUTH AND THROAT WITH OR WITHOUT PRESENCE OF ULCERS (sore throat, sores in mouth, or a toothache) UNUSUAL RASH, SWELLING OR PAIN  UNUSUAL VAGINAL DISCHARGE OR ITCHING   Items with * indicate a potential emergency and should be followed up as soon as possible or go to the Emergency Department if any problems should occur.  Please show the CHEMOTHERAPY ALERT CARD or IMMUNOTHERAPY ALERT CARD at check-in to  the Emergency Department and triage nurse.  Should you have questions after your visit or need to cancel or reschedule your appointment, please contact [facility]  Dept: [phone]  and follow the prompts.  Office hours are 8:00 a.m. to 4:30 p.m. Monday - Friday. Please note that voicemails left after 4:00 p.m. may not be returned until the following business day.  We are closed weekends and major holidays. You have access to a nurse at all times for urgent questions. Please call the main number to the clinic Dept: [phone] and follow the prompts.   For any non-urgent questions, you may also contact your provider using MyChart. We now offer e-Visits for anyone 10 and older to request care online for non-urgent symptoms. For details visit [email or web address].   Also download the MyChart app! Go to the app store, search "MyChart", open the app, select [facility], and log in with your MyChart username and password.  Due to Covid, a mask is required upon entering the hospital/clinic. If you do not have a mask, one will be given to you upon arrival. For doctor visits, patients may have 1 support person aged 34 or older with them. For treatment visits, patients cannot have anyone with them due to current Covid guidelines and our immunocompromised population.

## 2020-10-05 NOTE — Progress Notes (Signed)
[facility] [phone]   [phone]  OFFICE PROGRESS NOTE  Copland, Gay Filler, MD [address] [place] [address]  DIAGNOSIS: Stage IV (T3, N2, M1a) non-small cell lung cancer, adenocarcinoma presented with large right upper lobe lung mass extending to the right suprahilar region in addition to right hilar and mediastinal lymphadenopathy with obstruction of the associated upper lobe bronchus and bilateral subcentimeter pulmonary nodules diagnosed in July 2021.  Biomarker Findings Microsatellite status - MS-Stable Tumor Mutational Burden - 3 Muts/Mb Genomic Findings For a complete list of the genes assayed, please refer to the Appendix. ERBB2 [id number], amplification - equivocal? HGF amplification - equivocal? MTAP loss exons 5-8 MYC amplification - equivocal? CDKN2A/B CDKN2B loss, CDKN2A loss CUL3 Y28f*19 RPTOR amplification TP53 [id number] Disease relevant genes with no reportable alterations: ALK, BRAF, EGFR, KRAS, MET, RET, ROS1  PDL1 Expression: Negative  PRIOR THERAPY:  1) Palliative radiotherapy to the obstructive lung mass under the care of Dr. MoLisbeth RenshawThe past treatment was scheduled for 11/23/19. 2) Systemic chemotherapy with carboplatin for AUC of 5, Alimta 500 mg/M2 and Keytruda 200 mg IV every 3 weeks.  First dose November 04, 2019.  Status post 9 cycles.  Starting from cycle #5 he will be on maintenance treatment with Alimta and Keytruda every 3 weeks. 3) Second line systemic chemotherapy with docetaxel 75 mg/M2 and Cyramza 10 mg/KG every weeks with Neulasta support.  First dose May 18, 2020.  Status post 2 cycles.  Last dose was given June 17, 2020 discontinued secondary to disease progression.  CURRENT THERAPY: Third line systemic chemotherapy with single agent gemcitabine 1000 mg/M2 on days 1 and 8 every 3 weeks.  First dose July 13, 2020.  Status post 4 cycles.  INTERVAL HISTORY: Micheal Devaul89.o. male returns to the clinic today for follow-up visit.  The patient is feeling fine today with no concerning complaints except for fatigue and shortness of breath with exertion.  He denied having any chest pain but has mild cough with no hemoptysis.  He denied having any fever or chills.  He has no current nausea, vomiting, diarrhea or constipation.  He continues to have sleep disturbance.  He underwent ultrasound-guided right thoracentesis with drainage of 1.75 L of pleural fluid.  He did not notice much difference in his breathing after the thoracentesis.  He missed day 8 of cycle #4 because of neutropenia.  The patient is here today for evaluation before starting cycle #5 of his treatment.  MEDICAL HISTORY: Past Medical History:  Diagnosis Date   Chicken pox    DM2 (diabetes mellitus, type 2) ([facility]   History of kidney stones    passed several    Hypertension    Kidney stones    5-6 times   Measles    Mumps    Stroke ([facility]   x 2, last one was 09/18/2019- has some expressive aphasia    ALLERGIES:  is allergic to bee venom.  MEDICATIONS:  Current Outpatient Medications  Medication Sig Dispense Refill   acetaminophen (TYLENOL) 500 MG tablet Take 500 mg by mouth every 6 (six) hours as needed.  (Patient not taking: Reported on 09/14/2020)     albuterol (VENTOLIN HFA) 108 (90 Base) MCG/ACT inhaler Inhale 2 puffs into the lungs every 6 (six) hours as needed for wheezing or shortness of breath. 8 g 2   apixaban (ELIQUIS) 5 MG TABS tablet Take 1 tablet (5  mg total) by mouth 2 (two) times daily. 60 tablet 11   atorvastatin (LIPITOR) 40 MG tablet Take 1 tablet (40 mg total) by mouth daily at 6 PM. 90 tablet 3   diltiazem (CARDIZEM CD) 180 MG 24 hr capsule Take 1 capsule (180 mg total) by mouth daily. 30 capsule 6   HYDROcodone bit-homatropine (HYCODAN) 5-1.5 MG/5ML syrup Take 5 mLs by mouth every 6 (six) hours as needed for cough. (Patient not taking: Reported on 09/14/2020)      metFORMIN (GLUCOPHAGE) 500 MG tablet TAKE TWO TABLETS BY MOUTH TWICE A DAY WITH MEAL(S) 360 tablet 3   Omega-3 Fatty Acids (FISH OIL) 1200 MG CAPS Take by mouth daily.     prochlorperazine (COMPAZINE) 10 MG tablet Take 1 tablet (10 mg total) by mouth every 6 (six) hours as needed. (Patient not taking: No sig reported) 30 tablet 2   No current facility-administered medications for this visit.    SURGICAL HISTORY:  Past Surgical History:  Procedure Laterality Date   COLONOSCOPY W/ POLYPECTOMY     VIDEO BRONCHOSCOPY WITH ENDOBRONCHIAL ULTRASOUND N/A 09/30/2019   Procedure: VIDEO BRONCHOSCOPY WITH ENDOBRONCHIAL ULTRASOUND;  Surgeon: Collene Gobble, MD;  Location: [facility];  Service: Thoracic;  Laterality: N/A;   WISDOM TOOTH EXTRACTION  1976-77    REVIEW OF SYSTEMS:  A comprehensive review of systems was negative except for: Constitutional: positive for fatigue Respiratory: positive for cough and dyspnea on exertion Behavioral/Psych: positive for sleep disturbance   PHYSICAL EXAMINATION: General appearance: alert, cooperative, fatigued, and no distress Head: Normocephalic, without obvious abnormality, atraumatic, Right eyelid droop Neck: no adenopathy, no JVD, supple, symmetrical, trachea midline, and thyroid not enlarged, symmetric, no tenderness/mass/nodules Lymph nodes: Cervical, supraclavicular, and axillary nodes normal. Resp: diminished breath sounds RLL and dullness to percussion RLL Back: symmetric, no curvature. ROM normal. No CVA tenderness. Cardio: regular rate and rhythm, S1, S2 normal, no murmur, click, rub or gallop GI: soft, non-tender; bowel sounds normal; no masses,  no organomegaly Extremities: extremities normal, atraumatic, no cyanosis or edema  ECOG PERFORMANCE STATUS: 1 - Symptomatic but completely ambulatory  Blood pressure 107/67, pulse 93, temperature (!) 97.5 F (36.4 C), temperature source Tympanic, resp. rate 18, weight 180 lb 2 oz (81.7 kg), SpO2 99  %.  LABORATORY DATA: Lab Results  Component Value Date   WBC 7.4 10/05/2020   HGB 11.1 (L) 10/05/2020   HCT 33.6 (L) 10/05/2020   MCV 101.5 (H) 10/05/2020   PLT 349 10/05/2020      Chemistry      Component Value Date/Time   NA 138 09/21/2020 0847   K 3.9 09/21/2020 0847   CL 105 09/21/2020 0847   CO2 21 (L) 09/21/2020 0847   BUN 13 09/21/2020 0847   CREATININE 1.05 09/21/2020 0847   CREATININE 1.13 10/16/2013 1118      Component Value Date/Time   CALCIUM 9.2 09/21/2020 0847   ALKPHOS 85 09/21/2020 0847   AST 62 (H) 09/21/2020 0847   ALT 80 (H) 09/21/2020 0847   BILITOT 0.8 09/21/2020 0847       RADIOGRAPHIC STUDIES: DG Chest 1 View  Result Date: 09/19/2020 CLINICAL DATA:  70 year old male with history of non-small cell lung cancer status post right thoracentesis. EXAM: CHEST  1 VIEW COMPARISON:  CT chest from 09/12/2020 FINDINGS: The heart size and mediastinal contours are within normal limits. Unchanged appearance of left implanted cardiac monitor. Moderate residual right pleural effusion after thoracentesis. No pneumothorax. Right basilar passive atelectasis. Similar appearing trace  left pleural effusion. Similar appearing soft tissue prominence in the right hilum compatible with post radiation changes. The visualized skeletal structures are unremarkable. IMPRESSION: 1. No pneumothorax after right thoracentesis. 2. Moderate residual right pleural effusion after right thoracentesis. 3. Trace left pleural effusion. Electronically Signed   By: Ruthann Cancer MD   On: 09/19/2020 11:05   CT Chest W Contrast  Result Date: 09/12/2020 CLINICAL DATA:  Non-small cell lung cancer (adenocarcinoma) diagnosed in July 2021. Prior radiation, chemotherapy and immunotherapy. EXAM: CT CHEST, ABDOMEN, AND PELVIS WITH CONTRAST TECHNIQUE: Multidetector CT imaging of the chest, abdomen and pelvis was performed following the standard protocol during bolus administration of intravenous contrast.  CONTRAST:  158m OMNIPAQUE IOHEXOL 300 MG/ML  SOLN COMPARISON:  CT 07/05/2020 and 05/10/2020. FINDINGS: CT CHEST FINDINGS Cardiovascular: No significant vascular findings. The heart size is normal. Trace pericardial fluid. Mediastinum/Nodes: There are no enlarged mediastinal, hilar or axillary lymph nodes. Stable partially calcified 9 mm left thyroid nodule on image 5/2. No followup recommended.(Ref: J Am Coll Radiol. 2015 Feb;12(2): 143-50). The trachea and esophagus appear unremarkable. Lungs/Pleura: Right pleural effusion has enlarged and is now large in volume. A small to moderate left pleural effusion has also mildly enlarged. No pleural nodularity identified. There is progressive compressive right lower lobe atelectasis. Paramediastinal radiation changes are present, right greater than left without signs of local recurrence. There is mildly progressive central airway thickening on the right. Multiple pulmonary nodules again noted bilaterally, similar to the most recent study. The largest nodules are in the left lower lobe, measuring 1.5 x 1.1 cm on image 100/4 (previously 1.3 x 1.2 cm) and 1.1 x 1.0 cm in the right lower lobe on image 112/4 (previously 0.9 x 0.8 cm. Musculoskeletal/Chest wall: No chest wall mass or suspicious osseous findings. CT ABDOMEN AND PELVIS FINDINGS Hepatobiliary: The liver is normal in density without suspicious focal abnormality. No evidence of gallstones, gallbladder wall thickening or biliary dilatation. Pancreas: Unremarkable. No pancreatic ductal dilatation or surrounding inflammatory changes. Spleen: Normal in size without focal abnormality. Adrenals/Urinary Tract: Both adrenal glands appear normal. Tiny nonobstructing bilateral renal calculi. No evidence of ureteral calculus or hydronephrosis. Both kidneys otherwise appear unremarkable. Large dependent bladder calculus again noted, measuring 1.9 cm. No significant bladder wall thickening. Stomach/Bowel: Enteric contrast was  given and has passed into the distal colon. The stomach appears unremarkable for its degree of distension. No evidence of bowel wall thickening, distention or surrounding inflammatory change. There are several duodenal diverticula. The appendix appears normal. There is scattered colonic diverticulosis, greatest within the sigmoid colon. Vascular/Lymphatic: There are no enlarged abdominal or pelvic lymph nodes. Mild aortic and branch vessel atherosclerosis. Reproductive: The prostate gland and seminal vesicles appear unchanged. Other: Stable mildly prominent fat in both inguinal canals. No ascites or peritoneal nodularity. Musculoskeletal: No acute or significant osseous findings. Lower lumbar spondylosis. IMPRESSION: 1. Compared with the prior CTs of 2 months ago, there are enlarging right greater than left pleural effusions with associated progressive compressive atelectasis of the right lower lobe. These effusions do not demonstrate any malignant features. 2. No significant short interval change in the multiple pulmonary metastases. 3. Evolving radiation changes in the right perihilar region without signs of local recurrence. Continued follow-up recommended. 4. No evidence of metastatic disease in the abdomen or pelvis. 5. Stable incidental abdominal findings including bilateral nephrolithiasis, a large bladder calculus and distal colonic diverticulosis. Electronically Signed   By: WRichardean SaleM.D.   On: 09/12/2020 12:16   CT Abdomen  Pelvis W Contrast  Result Date: 09/12/2020 CLINICAL DATA:  Non-small cell lung cancer (adenocarcinoma) diagnosed in July 2021. Prior radiation, chemotherapy and immunotherapy. EXAM: CT CHEST, ABDOMEN, AND PELVIS WITH CONTRAST TECHNIQUE: Multidetector CT imaging of the chest, abdomen and pelvis was performed following the standard protocol during bolus administration of intravenous contrast. CONTRAST:  133m OMNIPAQUE IOHEXOL 300 MG/ML  SOLN COMPARISON:  CT 07/05/2020 and  05/10/2020. FINDINGS: CT CHEST FINDINGS Cardiovascular: No significant vascular findings. The heart size is normal. Trace pericardial fluid. Mediastinum/Nodes: There are no enlarged mediastinal, hilar or axillary lymph nodes. Stable partially calcified 9 mm left thyroid nodule on image 5/2. No followup recommended.(Ref: J Am Coll Radiol. 2015 Feb;12(2): 143-50). The trachea and esophagus appear unremarkable. Lungs/Pleura: Right pleural effusion has enlarged and is now large in volume. A small to moderate left pleural effusion has also mildly enlarged. No pleural nodularity identified. There is progressive compressive right lower lobe atelectasis. Paramediastinal radiation changes are present, right greater than left without signs of local recurrence. There is mildly progressive central airway thickening on the right. Multiple pulmonary nodules again noted bilaterally, similar to the most recent study. The largest nodules are in the left lower lobe, measuring 1.5 x 1.1 cm on image 100/4 (previously 1.3 x 1.2 cm) and 1.1 x 1.0 cm in the right lower lobe on image 112/4 (previously 0.9 x 0.8 cm. Musculoskeletal/Chest wall: No chest wall mass or suspicious osseous findings. CT ABDOMEN AND PELVIS FINDINGS Hepatobiliary: The liver is normal in density without suspicious focal abnormality. No evidence of gallstones, gallbladder wall thickening or biliary dilatation. Pancreas: Unremarkable. No pancreatic ductal dilatation or surrounding inflammatory changes. Spleen: Normal in size without focal abnormality. Adrenals/Urinary Tract: Both adrenal glands appear normal. Tiny nonobstructing bilateral renal calculi. No evidence of ureteral calculus or hydronephrosis. Both kidneys otherwise appear unremarkable. Large dependent bladder calculus again noted, measuring 1.9 cm. No significant bladder wall thickening. Stomach/Bowel: Enteric contrast was given and has passed into the distal colon. The stomach appears unremarkable for its  degree of distension. No evidence of bowel wall thickening, distention or surrounding inflammatory change. There are several duodenal diverticula. The appendix appears normal. There is scattered colonic diverticulosis, greatest within the sigmoid colon. Vascular/Lymphatic: There are no enlarged abdominal or pelvic lymph nodes. Mild aortic and branch vessel atherosclerosis. Reproductive: The prostate gland and seminal vesicles appear unchanged. Other: Stable mildly prominent fat in both inguinal canals. No ascites or peritoneal nodularity. Musculoskeletal: No acute or significant osseous findings. Lower lumbar spondylosis. IMPRESSION: 1. Compared with the prior CTs of 2 months ago, there are enlarging right greater than left pleural effusions with associated progressive compressive atelectasis of the right lower lobe. These effusions do not demonstrate any malignant features. 2. No significant short interval change in the multiple pulmonary metastases. 3. Evolving radiation changes in the right perihilar region without signs of local recurrence. Continued follow-up recommended. 4. No evidence of metastatic disease in the abdomen or pelvis. 5. Stable incidental abdominal findings including bilateral nephrolithiasis, a large bladder calculus and distal colonic diverticulosis. Electronically Signed   By: WRichardean SaleM.D.   On: 09/12/2020 12:16   [place]oracentesis Asp Pleural space w/IMG guide  Result Date: 09/19/2020 INDICATION: Patient with history of non small cell lung cancer with right-sided pleural effusion. Request is for therapeutic and diagnostic right sided thoracentesis. EXAM: ULTRASOUND GUIDED THERAPEUTIC AND DIAGNOSTIC RIGHT SIDED THORACENTESIS MEDICATIONS: Lidocaine 1% 10 mL COMPLICATIONS: None immediate. PROCEDURE: An ultrasound guided thoracentesis was thoroughly discussed with the patient and  questions answered. The benefits, risks, alternatives and complications were also discussed. The patient  understands and wishes to proceed with the procedure. Written consent was obtained. Ultrasound was performed to localize and mark an adequate pocket of fluid in the right chest. The area was then prepped and draped in the normal sterile fashion. 1% Lidocaine was used for local anesthesia. Under ultrasound guidance a 6 Fr Safe-T-Centesis catheter was introduced. Thoracentesis was performed. The catheter was removed and a dressing applied. FINDINGS: A total of approximately 1.75 mL of yellowish brown colored fluid was removed. Samples were sent to the laboratory as requested by the clinical team. IMPRESSION: Successful ultrasound guided therapeutic and diagnostic right-sided thoracentesis yielding 1.75 mL of pleural fluid. Read by: Rushie Nyhan, NP Electronically Signed   By: Ruthann Cancer MD   On: 09/19/2020 11:12     ASSESSMENT AND PLAN: This is a very pleasant 70 years old white male recently diagnosed with a stage IV (T3, N2, M1 a) non-small cell lung cancer, adenocarcinoma presented with large right upper lobe lung mass with extension to the right suprahilar region as well as right hilar and mediastinal lymphadenopathy with obstruction of the right upper lobe and bilateral pulmonary nodules diagnosed in July 2021. The molecular study shows no actionable mutations and PD-L1 expression is negative. He had a short course of palliative radiotherapy to the obstructive lung mass under the care of Dr. Lisbeth Renshaw. The patient underwent systemic chemotherapy with carboplatin for AUC of 5, Alimta 500 mg/M2 and Keytruda 200 mg IV every 3 weeks status post 9 cycles.  Starting from cycle #5 he will be on maintenance treatment with Alimta and Keytruda every 3 weeks.  He has been tolerating this treatment well with no concerning complaints except for fatigue and cough.  This treatment was discontinued secondary to disease progression. The patient underwent palliative second line systemic chemotherapy with docetaxel 75  mg/M2 and Cyramza 10 mg/KG every 3 weeks with Neulasta support.  Started May 18, 2020.  Status post 2 cycles. Unfortunately his scan showed evidence for disease progression and multiple bilateral pulmonary nodules but stable disease in the dominant mass and right pleural effusion. He is currently undergoing palliative systemic chemotherapy with single agent gemcitabine 1000 MG/M2 IV on days 1 and 8 every 3 weeks.  Status post 4 cycles. The patient continues to tolerate this treatment well except for fatigue. I recommended for him to proceed with cycle #5 today as planned. For the recurrent right pleural effusion, will continue to monitor it closely and consider The patient for repeat thoracentesis if needed. For the insomnia, he will try Tylenol PM. For the cough I will give him refill of Hycodan. He will come back for follow-up visit in 3 weeks with the start of cycle #6. The patient was advised to call immediately if he has any concerning symptoms in the interval.  The patient voices understanding of current disease status and treatment options and is in agreement with the current care plan.  All questions were answered. The patient knows to call the clinic with any problems, questions or concerns. We can certainly see the patient much sooner if necessary.  Disclaimer: This note was dictated with voice recognition software. Similar sounding words can inadvertently be transcribed and may not be corrected upon review.

## 2020-10-10 ENCOUNTER — Ambulatory Visit (INDEPENDENT_AMBULATORY_CARE_PROVIDER_SITE_OTHER): Payer: Medicare HMO

## 2020-10-10 DIAGNOSIS — I639 Cerebral infarction, unspecified: Secondary | ICD-10-CM | POA: Diagnosis not present

## 2020-10-12 ENCOUNTER — Inpatient Hospital Stay: Payer: Medicare HMO

## 2020-10-12 ENCOUNTER — Other Ambulatory Visit: Payer: Self-pay

## 2020-10-12 VITALS — BP 142/82 | HR 98 | Temp 98.2°F | Resp 18 | Wt 178.0 lb

## 2020-10-12 DIAGNOSIS — Z5111 Encounter for antineoplastic chemotherapy: Secondary | ICD-10-CM | POA: Diagnosis not present

## 2020-10-12 DIAGNOSIS — Z923 Personal history of irradiation: Secondary | ICD-10-CM | POA: Diagnosis not present

## 2020-10-12 DIAGNOSIS — C3411 Malignant neoplasm of upper lobe, right bronchus or lung: Secondary | ICD-10-CM

## 2020-10-12 DIAGNOSIS — Z79899 Other long term (current) drug therapy: Secondary | ICD-10-CM | POA: Diagnosis not present

## 2020-10-12 LAB — CBC WITH DIFFERENTIAL (CANCER CENTER ONLY)
Abs Immature Granulocytes: 0.12 10*3/uL — ABNORMAL HIGH (ref 0.00–0.07)
Basophils Absolute: 0.1 10*3/uL (ref 0.0–0.1)
Basophils Relative: 2 %
Eosinophils Absolute: 0 10*3/uL (ref 0.0–0.5)
Eosinophils Relative: 1 %
HCT: 29 % — ABNORMAL LOW (ref 39.0–52.0)
Hemoglobin: 9.9 g/dL — ABNORMAL LOW (ref 13.0–17.0)
Immature Granulocytes: 3 %
Lymphocytes Relative: 14 %
Lymphs Abs: 0.5 10*3/uL — ABNORMAL LOW (ref 0.7–4.0)
MCH: 34.3 pg — ABNORMAL HIGH (ref 26.0–34.0)
MCHC: 34.1 g/dL (ref 30.0–36.0)
MCV: 100.3 fL — ABNORMAL HIGH (ref 80.0–100.0)
Monocytes Absolute: 0.8 10*3/uL (ref 0.1–1.0)
Monocytes Relative: 19 %
Neutro Abs: 2.5 10*3/uL (ref 1.7–7.7)
Neutrophils Relative %: 61 %
Platelet Count: 199 10*3/uL (ref 150–400)
RBC: 2.89 MIL/uL — ABNORMAL LOW (ref 4.22–5.81)
RDW: 18.6 % — ABNORMAL HIGH (ref 11.5–15.5)
WBC Count: 4 10*3/uL (ref 4.0–10.5)
nRBC: 0 % (ref 0.0–0.2)

## 2020-10-12 LAB — LACTATE DEHYDROGENASE: LDH: 176 U/L (ref 98–192)

## 2020-10-12 LAB — CMP (CANCER CENTER ONLY)
ALT: 36 U/L (ref 0–44)
AST: 38 U/L (ref 15–41)
Albumin: 2.9 g/dL — ABNORMAL LOW (ref 3.5–5.0)
Alkaline Phosphatase: 85 U/L (ref 38–126)
Anion gap: 10 (ref 5–15)
BUN: 17 mg/dL (ref 8–23)
CO2: 24 mmol/L (ref 22–32)
Calcium: 9.8 mg/dL (ref 8.9–10.3)
Chloride: 106 mmol/L (ref 98–111)
Creatinine: 0.97 mg/dL (ref 0.61–1.24)
GFR, Estimated: >60 mL/min (ref >60)
Glucose, Bld: 104 mg/dL — ABNORMAL HIGH (ref 70–99)
Potassium: 3.8 mmol/L (ref 3.5–5.1)
Sodium: 140 mmol/L (ref 135–145)
Total Bilirubin: 0.7 mg/dL (ref 0.3–1.2)
Total Protein: 6.7 g/dL (ref 6.5–8.1)

## 2020-10-12 LAB — CUP PACEART REMOTE DEVICE CHECK
Date Time Interrogation Session: 20220712230554
Implantable Pulse Generator Implant Date: 20210907

## 2020-10-12 MED ORDER — DIPHENHYDRAMINE HCL 25 MG PO CAPS
ORAL_CAPSULE | ORAL | Status: AC
  Filled 2020-10-12: qty 2

## 2020-10-12 MED ORDER — SODIUM CHLORIDE 0.9 % IV SOLN
Freq: Once | INTRAVENOUS | Status: AC
  Filled 2020-10-12: qty 250

## 2020-10-12 MED ORDER — PROCHLORPERAZINE MALEATE 10 MG PO TABS
10.0000 mg | ORAL_TABLET | Freq: Once | ORAL | Status: AC
  Administered 2020-10-12: 10 mg via ORAL

## 2020-10-12 MED ORDER — PROCHLORPERAZINE MALEATE 10 MG PO TABS
ORAL_TABLET | ORAL | Status: AC
  Filled 2020-10-12: qty 1

## 2020-10-12 MED ORDER — SODIUM CHLORIDE 0.9 % IV SOLN
1000.0000 mg/m2 | Freq: Once | INTRAVENOUS | Status: AC
  Administered 2020-10-12: 2052 mg via INTRAVENOUS
  Filled 2020-10-12: qty 53.97

## 2020-10-12 MED ORDER — PALONOSETRON HCL INJECTION 0.25 MG/5ML
INTRAVENOUS | Status: AC
  Filled 2020-10-12: qty 5

## 2020-10-12 MED ORDER — ACETAMINOPHEN 325 MG PO TABS
ORAL_TABLET | ORAL | Status: AC
  Filled 2020-10-12: qty 2

## 2020-10-12 NOTE — Patient Instructions (Signed)
[facility]  Discharge Instructions: Thank you for choosing [facility] to provide your oncology and hematology care.   If you have a lab appointment with the [facility], please go directly to the [facility] and check in at the registration area.   Wear comfortable clothing and clothing appropriate for easy access to any Portacath or PICC line.   We strive to give you quality time with your provider. You may need to reschedule your appointment if you arrive late (15 or more minutes).  Arriving late affects you and other patients whose appointments are after yours.  Also, if you miss three or more appointments without notifying the office, you may be dismissed from the clinic at the provider's discretion.      For prescription refill requests, have your pharmacy contact our office and allow 72 hours for refills to be completed.    Today you received the following chemotherapy and/or immunotherapy agents gemcitabine      To help prevent nausea and vomiting after your treatment, we encourage you to take your nausea medication as directed.  BELOW ARE SYMPTOMS THAT SHOULD BE REPORTED IMMEDIATELY: *FEVER GREATER THAN 100.4 F (38 C) OR HIGHER *CHILLS OR SWEATING *NAUSEA AND VOMITING THAT IS NOT CONTROLLED WITH YOUR NAUSEA MEDICATION *UNUSUAL SHORTNESS OF BREATH *UNUSUAL BRUISING OR BLEEDING *URINARY PROBLEMS (pain or burning when urinating, or frequent urination) *BOWEL PROBLEMS (unusual diarrhea, constipation, pain near the anus) TENDERNESS IN MOUTH AND THROAT WITH OR WITHOUT PRESENCE OF ULCERS (sore throat, sores in mouth, or a toothache) UNUSUAL RASH, SWELLING OR PAIN  UNUSUAL VAGINAL DISCHARGE OR ITCHING   Items with * indicate a potential emergency and should be followed up as soon as possible or go to the Emergency Department if any problems should occur.  Please show the CHEMOTHERAPY ALERT CARD or IMMUNOTHERAPY ALERT CARD at check-in to  the Emergency Department and triage nurse.  Should you have questions after your visit or need to cancel or reschedule your appointment, please contact [facility]  Dept: [phone]  and follow the prompts.  Office hours are 8:00 a.m. to 4:30 p.m. Monday - Friday. Please note that voicemails left after 4:00 p.m. may not be returned until the following business day.  We are closed weekends and major holidays. You have access to a nurse at all times for urgent questions. Please call the main number to the clinic Dept: [phone] and follow the prompts.   For any non-urgent questions, you may also contact your provider using MyChart. We now offer e-Visits for anyone 14 and older to request care online for non-urgent symptoms. For details visit [email or web address].   Also download the MyChart app! Go to the app store, search "MyChart", open the app, select [facility], and log in with your MyChart username and password.  Due to Covid, a mask is required upon entering the hospital/clinic. If you do not have a mask, one will be given to you upon arrival. For doctor visits, patients may have 1 support person aged 95 or older with them. For treatment visits, patients cannot have anyone with them due to current Covid guidelines and our immunocompromised population.

## 2020-10-17 ENCOUNTER — Other Ambulatory Visit: Payer: Self-pay | Admitting: Family Medicine

## 2020-10-17 DIAGNOSIS — E785 Hyperlipidemia, unspecified: Secondary | ICD-10-CM

## 2020-10-24 NOTE — Progress Notes (Signed)
[facility] OFFICE PROGRESS NOTE  Copland, Gay Filler, MD [address] [place] [address]  DIAGNOSIS: Stage IV (T3, N2, M1a) non-small cell lung cancer, adenocarcinoma presented with large right upper lobe lung mass extending to the right suprahilar region in addition to right hilar and mediastinal lymphadenopathy with obstruction of the associated upper lobe bronchus and bilateral subcentimeter pulmonary nodules diagnosed in July 2021.   Biomarker Findings Microsatellite status - MS-Stable Tumor Mutational Burden - 3 Muts/Mb Genomic Findings For a complete list of the genes assayed, please refer to the Appendix. ERBB2 [id number], amplification - equivocal? HGF amplification - equivocal? MTAP loss exons 5-8 MYC amplification - equivocal? CDKN2A/B CDKN2B loss, CDKN2A loss CUL3 Y23f*19 RPTOR amplification TP53 [id number] 7 Disease relevant genes with no reportable alterations: ALK, BRAF, EGFR, KRAS, MET, RET, ROS1   PDL1 Expression: Negative  PRIOR THERAPY: 1) Palliative radiotherapy to the obstructive lung mass under the care of Dr. MoLisbeth RenshawThe last treatment was on 11/23/19 2) Systemic chemotherapy with carboplatin for AUC of 5, Alimta 500 mg/M2 and Keytruda 200 mg IV every 3 weeks.  First dose November 04, 2019.  Status post 9 cycles.  Starting from cycle #5 he was on maintenance treatment with Alimta and Keytruda every 3 weeks. 3) Second line systemic chemotherapy with docetaxel 75 mg/M2 and Cyramza 10 mg/KG every weeks with Neulasta support.  First dose May 18, 2020.  Status post 2 cycles.  Last dose was given June 17, 2020 discontinued secondary to disease progression.  CURRENT THERAPY: Third line systemic chemotherapy with single agent gemcitabine 1000 mg/M2 on days 1 and 8 every 3 weeks.  First dose July 13, 2020. Status post 5 cycles.  INTERVAL HISTORY: Micheal Vigen70.o. male returns to the clinic today for a follow-up visit.   The patient is feeling fatigued today.  He is currently undergoing third line palliative systemic chemotherapy with gemcitabine.  He continues to have significant fatigue, decreased exercise tolerance, and dyspnea on exertion.  He denies any recent fevers, chills, or night sweats. He stays cold a lot. The patient has been struggling with decreased appetite for which he was previously seen by member the nutritionist team.  He continues to have dyspnea on exertion.  He does have a history of pleural effusions last time it was drained was on 09/19/20 in which 1.695mas obtained. He notes overall his breathing is better than prior to his thoracentesis but he is starting to get a recurrent cough and dyspnea on exertion. He denies chest pain. He also has a cough which he uses hycodan. He has nausea and intermittent vomiting. His anti-emetic helps his nausea but he struggles to stay ahead of his nausea. He has bilateral lower extremity swelling in his feet/ankles. He tries to elevate his legs. He has constipation for several days following chemotherapy. He denies headaches or visual changes. Dr. MohJulien Nordmanneviously ordered a brain MRI due to right eye ptosis which was negative for metastatic disease but noted some prior infarcts. There was one area that warranted further monitoring and recommended follow up MRI in 2-3 months. I discussed this with the patient today and he refused a repeat MRI. He is here for evaluation before starting cycle #6.   MEDICAL HISTORY: Past Medical History:  Diagnosis Date   Chicken pox    DM2 (di[place], type 2) [place]  History of kidney stones    passed several    Hypertension    Kidney  stones    5-6 times   Measles    Mumps    Stroke ([place])    x 2, last one was 69/18/2021- has some expressive aphasia    ALLERGIES:  is allergic to bee venom.  MEDICATIONS:  Current Outpatient Medications  Medication Sig Dispense Refill   acetaminophen (TYLENOL) 500 MG tablet Take  500 mg by mouth every 6 (six) hours as needed.     albuterol (VENTOLIN HFA) 108 (90 Base) MCG/ACT inhaler Inhale 2 puffs into the lungs every 6 (six) hours as needed for wheezing or shortness of breath. 8 g 2   apixaban (ELIQUIS) 5 MG TABS tablet Take 1 tablet (5 mg total) by mouth 2 (two) times daily. 60 tablet 11   atorvastatin (LIPITOR) 40 MG tablet TAKE ONE TABLET BY MOUTH ONE TIME DAILY 6PM 90 tablet 3   diltiazem (CARDIZEM CD) 180 MG 24 hr capsule Take 1 capsule (180 mg total) by mouth daily. 30 capsule 6   HYDROcodone bit-homatropine (HYCODAN) 5-1.5 MG/5ML syrup Take 5 mLs by mouth every 6 (six) hours as needed for cough. 120 mL 0   metFORMIN (GLUCOPHAGE) 500 MG tablet TAKE TWO TABLETS BY MOUTH TWICE A DAY WITH MEAL(S) 360 tablet 3   Omega-3 Fatty Acids (FISH OIL) 1200 MG CAPS Take by mouth daily.     prochlorperazine (COMPAZINE) 10 MG tablet Take 1 tablet (10 mg total) by mouth every 6 (six) hours as needed. 30 tablet 2   No current facility-administered medications for this visit.    SURGICAL HISTORY:  Past Surgical History:  Procedure Laterality Date   COLONOSCOPY W/ POLYPECTOMY     VIDEO BRONCHOSCOPY WITH ENDOBRONCHIAL ULTRASOUND N/A 09/30/2019   Procedure: VIDEO BRONCHOSCOPY WITH ENDOBRONCHIAL ULTRASOUND;  Surgeon: Collene Gobble, MD;  Location: [facility];  Service: Thoracic;  Laterality: N/A;   WISDOM TOOTH EXTRACTION  1976-77    REVIEW OF SYSTEMS:   Review of Systems  Constitutional: Positive for fatigue, decreased appetite, cold intolerance, and weight loss.  Negative for chills and fever.  HENT: Negative for mouth sores, nosebleeds, sore throat and trouble swallowing.   Eyes: Negative for eye problems and icterus.  Respiratory: Positive for dyspnea on exertion and cough.  Negative for hemoptysis and wheezing.   Cardiovascular: Positive for bilateral lower extremity swelling.  Negative for chest pain. Gastrointestinal: Positive for constipation following treatment.  Positive  for intermittent nausea and vomiting.  Negative for abdominal pain, and diarrhea. Genitourinary: Negative for bladder incontinence, difficulty urinating, dysuria, frequency and hematuria.   Musculoskeletal: Negative for back pain, gait problem, neck pain and neck stiffness.  Skin: Negative for itching and rash.  Neurological: Negative for dizziness, extremity weakness, gait problem, headaches, light-headedness and seizures.  Hematological: Negative for adenopathy. Does not bruise/bleed easily.  Psychiatric/Behavioral: Negative for confusion, depression and sleep disturbance. The patient is not nervous/anxious.     PHYSICAL EXAMINATION:  Blood pressure 127/84, pulse 89, temperature 97.7 F (36.5 C), temperature source Tympanic, resp. rate 19, height 6' 2"  (1.88 m), weight 174 lb 1.6 oz (79 kg), SpO2 96 %.  ECOG PERFORMANCE STATUS: 2  Physical Exam  Constitutional: Oriented to person, place, and time and thin appearing male and in no distress. HENT: Head: Normocephalic and atraumatic. Mouth/Throat: Oropharynx is clear and moist. No oropharyngeal exudate. Eyes: Right eye ptosis. Conjunctivae are normal. Right eye exhibits no discharge. Left eye exhibits no discharge. No scleral icterus. Neck: Normal range of motion. Neck supple. Cardiovascular: Normal rate, regular rhythm, normal heart sounds  and intact distal pulses.   Pulmonary/Chest: Effort normal and breath sounds normal. Decreased breath sounds in base of right lung. No respiratory distress. No wheezes. No rales. Abdominal: Soft. Bowel sounds are normal. Exhibits no distension and no mass. There is no tenderness.  Musculoskeletal: Normal range of motion. Exhibits no edema.  Lymphadenopathy:    No cervical adenopathy.  Neurological: Alert and oriented to person, place, and time. Exhibits normal muscle tone. Gait normal. Coordination normal. Skin: Skin is warm and dry. No rash noted. Not diaphoretic. No erythema. No pallor.  Psychiatric:  Mood, memory and judgment normal. Vitals reviewed.  LABORATORY DATA: Lab Results  Component Value Date   WBC 3.8 (L) 10/26/2020   HGB 9.8 (L) 10/26/2020   HCT 28.6 (L) 10/26/2020   MCV 101.8 (H) 10/26/2020   PLT 268 10/26/2020      Chemistry      Component Value Date/Time   NA 139 10/26/2020 0853   K 3.9 10/26/2020 0853   CL 104 10/26/2020 0853   CO2 23 10/26/2020 0853   BUN 16 10/26/2020 0853   CREATININE 1.15 10/26/2020 0853   CREATININE 1.13 10/16/2013 1118      Component Value Date/Time   CALCIUM 9.6 10/26/2020 0853   ALKPHOS 89 10/26/2020 0853   AST 21 10/26/2020 0853   ALT 36 10/26/2020 0853   BILITOT 0.8 10/26/2020 0853       RADIOGRAPHIC STUDIES:  CUP PACEART REMOTE DEVICE CHECK  Result Date: 10/12/2020 ILR summary report received. Battery status OK. Normal device function. No new symptom, tachy, brady, or pause episodes.  Known persistent AF.  Prescribed Eliquis, Diltiazem,  rates <100 ~50% of the time.  Monthly summary reports and ROV/PRN Route to triage for rates.    ASSESSMENT/PLAN:  This is a very pleasant 70 year old Caucasian male diagnosed with a stage IV (T3, N2, M1 a) non-small cell lung cancer, adenocarcinoma presented with large right upper lobe lung mass with extension to the right suprahilar region as well as right hilar and mediastinal lymphadenopathy with obstruction of the right upper lobe and bilateral pulmonary nodules diagnosed in July 2021. Negative PDL1 expression. He has no actionable mutations   The patient completed palliative radiotherapy to the obstructive lung mass under the care of Dr. Lisbeth Renshaw. The last treatment was in August 2021  The patient underwent systemic chemotherapy with carboplatin for AUC of 5, Alimta 500 mg/M2, and Keytruda 200 mg IV every 3 weeks status post 9 cycles.  Starting from cycle #5 he was on maintenance treatment with Alimta and Keytruda every 3 weeks. This treatment was discontinued secondary to disease  progression.   The patient underwent palliative second line systemic chemotherapy with docetaxel 75 mg/M2 and Cyramza 10 mg/KG IV every 3 weeks with Neulasta support.  Starting on May 18, 2020.  Status post 2 cycles. The patient has a rough time tolerating this treatment. He had evidence of disease progression with multiple bilateral pulmonary nodules but stable disease in the dominant mass and right pleural effusion.  His treatment was discontinued and he is currently undergoing palliative systemic chemotherapy with single agent gemcitabine 1000 MG/M2 IV on days 1 and 8 every 3 weeks. He is status 5 cycles and tolerated it fair except for fatigue.   The patient was seen with Dr. Julien Nordmann. Labs were reviewed. Recommend that he proceed with cycle #6 today scheduled  I will arrange for a restaging CT scan before his next cycle of treatment so we can reassess his response  to treatment.  We will evaluate the patient's right pleural effusion on his upcoming CT scan; however, I did offer the patient a chest x-ray today to see if he requires another thoracentesis.  The patient does not feel that he needs a chest x-ray at this time and will wait until his upcoming restaging CT scan.  I advised patient to call us if he feels like he needs a chest x-ray to evaluate this sooner.    We will see him back for a follow up visit in 3 weeks for evaluation and review his scan results before starting cycle #7.   I discussed with the patient ordering a repeat brain MRI to follow-up on the abnormalities soon on prior MRI.  The patient refused repeat MRI at this point.  I reviewed how to take the patient's antiemetics.  I sent a refill to his pharmacy.  Regarding for his constipation, I encouraged the patient to take a stool softener to prevent constipation if he knows that he has constipation following chemotherapy.  The patient was advised to call immediately if he has any concerning symptoms in the  interval. The patient voices understanding of current disease status and treatment options and is in agreement with the current care plan. All questions were answered. The patient knows to call the clinic with any problems, questions or concerns. We can certainly see the patient much sooner if necessary      Orders Placed This Encounter  Procedures   CT Chest W Contrast    Standing Status:   Future    Standing Expiration Date:   10/26/2021    Order Specific Question:   If indicated for the ordered procedure, I authorize the administration of contrast media per Radiology protocol    Answer:   Yes    Order Specific Question:   Preferred imaging location?    Answer:   [facility]   CT Abdomen Pelvis W Contrast    Standing Status:   Future    Standing Expiration Date:   10/26/2021    Order Specific Question:   If indicated for the ordered procedure, I authorize the administration of contrast media per Radiology protocol    Answer:   Yes    Order Specific Question:   Preferred imaging location?    Answer:   [facility]    Order Specific Question:   Release to patient    Answer:   Immediate    Order Specific Question:   Is Oral Contrast requested for this exam?    Answer:   Yes, Per Radiology protocol       Bridey Brookover L Cornell Bourbon, PA-C 10/26/20   ADDENDUM: Hematology/Oncology Attending: I had a face-to-face encounter with the patient today.  I reviewed his record, lab and recommended his care plan.  This is a very pleasant 70 years old white male diagnosed with a stage IV (T3, N2, M1 a) non-small cell lung cancer, adenocarcinoma in July 2021 with negative PD-L1 expression and no actionable mutations. The patient underwent induction systemic chemotherapy with carboplatin, Alimta and Keytruda status post 9 cycles discontinued secondary to disease progression. He started second line systemic chemotherapy with docetaxel and Cyramza status post 2 cycles discontinued  secondary to intolerance and disease progression. He Is currently undergoing systemic chemotherapy with single agent gemcitabine 1000 Mg/M2 on days 1 and 8 every 3 weeks status post 5 cycles. The patient has been tolerating this treatment much better except for fatigue. I recommended for him to  proceed with cycle #6 today as planned. We will see him back for follow-up visit in 3 weeks for evaluation with repeat CT scan of the chest, abdomen pelvis for restaging of his disease. The patient was advised to call immediately if he has any other concerning symptoms in the interval. Disclaimer: This note was dictated with voice recognition software. Similar sounding words can inadvertently be transcribed and may be missed upon review. Eilleen Kempf, MD 10/26/20

## 2020-10-26 ENCOUNTER — Inpatient Hospital Stay: Payer: Medicare HMO

## 2020-10-26 ENCOUNTER — Other Ambulatory Visit: Payer: Self-pay

## 2020-10-26 ENCOUNTER — Inpatient Hospital Stay (HOSPITAL_BASED_OUTPATIENT_CLINIC_OR_DEPARTMENT_OTHER): Payer: Medicare HMO | Admitting: Physician Assistant

## 2020-10-26 VITALS — BP 127/84 | HR 89 | Temp 97.7°F | Resp 19 | Ht 74.0 in | Wt 174.1 lb

## 2020-10-26 DIAGNOSIS — Z79899 Other long term (current) drug therapy: Secondary | ICD-10-CM | POA: Diagnosis not present

## 2020-10-26 DIAGNOSIS — R059 Cough, unspecified: Secondary | ICD-10-CM | POA: Diagnosis not present

## 2020-10-26 DIAGNOSIS — C3411 Malignant neoplasm of upper lobe, right bronchus or lung: Secondary | ICD-10-CM | POA: Diagnosis not present

## 2020-10-26 DIAGNOSIS — Z923 Personal history of irradiation: Secondary | ICD-10-CM | POA: Diagnosis not present

## 2020-10-26 DIAGNOSIS — Z5111 Encounter for antineoplastic chemotherapy: Secondary | ICD-10-CM | POA: Diagnosis not present

## 2020-10-26 LAB — CBC WITH DIFFERENTIAL (CANCER CENTER ONLY)
Abs Immature Granulocytes: 0 10*3/uL (ref 0.00–0.07)
Basophils Absolute: 0 10*3/uL (ref 0.0–0.1)
Basophils Relative: 0 %
Eosinophils Absolute: 0 10*3/uL (ref 0.0–0.5)
Eosinophils Relative: 1 %
HCT: 28.6 % — ABNORMAL LOW (ref 39.0–52.0)
Hemoglobin: 9.8 g/dL — ABNORMAL LOW (ref 13.0–17.0)
Immature Granulocytes: 0 %
Lymphocytes Relative: 13 %
Lymphs Abs: 0.5 10*3/uL — ABNORMAL LOW (ref 0.7–4.0)
MCH: 34.9 pg — ABNORMAL HIGH (ref 26.0–34.0)
MCHC: 34.3 g/dL (ref 30.0–36.0)
MCV: 101.8 fL — ABNORMAL HIGH (ref 80.0–100.0)
Monocytes Absolute: 0.4 10*3/uL (ref 0.1–1.0)
Monocytes Relative: 11 %
Neutro Abs: 2.9 10*3/uL (ref 1.7–7.7)
Neutrophils Relative %: 75 %
Platelet Count: 268 10*3/uL (ref 150–400)
RBC: 2.81 MIL/uL — ABNORMAL LOW (ref 4.22–5.81)
RDW: 19 % — ABNORMAL HIGH (ref 11.5–15.5)
WBC Count: 3.8 10*3/uL — ABNORMAL LOW (ref 4.0–10.5)
nRBC: 0 % (ref 0.0–0.2)

## 2020-10-26 LAB — CMP (CANCER CENTER ONLY)
ALT: 36 U/L (ref 0–44)
AST: 21 U/L (ref 15–41)
Albumin: 3.3 g/dL — ABNORMAL LOW (ref 3.5–5.0)
Alkaline Phosphatase: 89 U/L (ref 38–126)
Anion gap: 12 (ref 5–15)
BUN: 16 mg/dL (ref 8–23)
CO2: 23 mmol/L (ref 22–32)
Calcium: 9.6 mg/dL (ref 8.9–10.3)
Chloride: 104 mmol/L (ref 98–111)
Creatinine: 1.15 mg/dL (ref 0.61–1.24)
GFR, Estimated: >60 mL/min (ref >60)
Glucose, Bld: 140 mg/dL — ABNORMAL HIGH (ref 70–99)
Potassium: 3.9 mmol/L (ref 3.5–5.1)
Sodium: 139 mmol/L (ref 135–145)
Total Bilirubin: 0.8 mg/dL (ref 0.3–1.2)
Total Protein: 6.7 g/dL (ref 6.5–8.1)

## 2020-10-26 MED ORDER — PROCHLORPERAZINE MALEATE 10 MG PO TABS: 10.0000 mg | ORAL_TABLET | Freq: Four times a day (QID) | ORAL | 2 refills | Status: AC | PRN

## 2020-10-26 MED ORDER — SODIUM CHLORIDE 0.9 % IV SOLN
Freq: Once | INTRAVENOUS | Status: AC
  Filled 2020-10-26: qty 250

## 2020-10-26 MED ORDER — SODIUM CHLORIDE 0.9 % IV SOLN
1000.0000 mg/m2 | Freq: Once | INTRAVENOUS | Status: DC
  Administered 2020-10-26: 2052 mg via INTRAVENOUS
  Filled 2020-10-26: qty 53.97

## 2020-10-26 MED ORDER — PROCHLORPERAZINE MALEATE 10 MG PO TABS
10.0000 mg | ORAL_TABLET | Freq: Once | ORAL | Status: AC
  Administered 2020-10-26: 10 mg via ORAL

## 2020-10-26 MED ORDER — PROCHLORPERAZINE MALEATE 10 MG PO TABS
ORAL_TABLET | ORAL | Status: AC
  Filled 2020-10-26: qty 1

## 2020-10-26 NOTE — Patient Instructions (Signed)
[facility] MEDICAL [facility]COLOG[facility]  Discharge Instructions: Thank you for choosing [facility] to provide your oncology and hematology care.   If you have a lab appointment with the Cancer Center, please go directly to the Cancer Center and check in at the registration area.   Wear comfortable clothing and clothing appropriate for easy access to any Portacath or PICC line.   We strive to give you quality time with your provider. You may need to reschedule your appointment if you arrive late (15 or more minutes).  Arriving late affects you and other patients whose appointments are after yours.  Also, if you miss three or more appointments without notifying the office, you may be dismissed from the clinic at the provider's discretion.      For prescription refill requests, have your pharmacy contact our office and allow 72 hours for refills to be completed.    Today you received the following chemotherapy and/or immunotherapy agents Gemzar      To help prevent nausea and vomiting after your treatment, we encourage you to take your nausea medication as directed.  BELOW ARE SYMPTOMS THAT SHOULD BE REPORTED IMMEDIATELY: *FEVER GREATER THAN 100.4 F (38 C) OR HIGHER *CHILLS OR SWEATING *NAUSEA AND VOMITING THAT IS NOT CONTROLLED WITH YOUR NAUSEA MEDICATION *UNUSUAL SHORTNESS OF BREATH *UNUSUAL BRUISING OR BLEEDING *URINARY PROBLEMS (pain or burning when urinating, or frequent urination) *BOWEL PROBLEMS (unusual diarrhea, constipation, pain near the anus) TENDERNESS IN MOUTH AND THROAT WITH OR WITHOUT PRESENCE OF ULCERS (sore throat, sores in mouth, or a toothache) UNUSUAL RASH, SWELLING OR PAIN  UNUSUAL VAGINAL DISCHARGE OR ITCHING   Items with * indicate a potential emergency and should be followed up as soon as possible or go to the Emergency Department if any problems should occur.  Please show the CHEMOTHERAPY ALERT CARD or IMMUNOTHERAPY ALERT CARD at check-in to the  Emergency Department and triage nurse.  Should you have questions after your visit or need to cancel or reschedule your appointment, please contact [facility]  Dept: [phone]  and follow the prompts.  Office hours are 8:00 a.m. to 4:30 p.m. Monday - Friday. Please note that voicemails left after 4:00 p.m. may not be returned until the following business day.  We are closed weekends and major holidays. You have access to a nurse at all times for urgent questions. Please call the main number to the clinic Dept: [phone] and follow the prompts.   For any non-urgent questions, you may also contact your provider using MyChart. We now offer e-Visits for anyone 18 and older to request care online for non-urgent symptoms. For details visit [email or web address].   Also download the MyChart app! Go to the app store, search "MyChart", open the app, select [facility], and log in with your MyChart username and password.  Due to Covid, a mask is required upon entering the hospital/clinic. If you do not have a mask, one will be given to you upon arrival. For doctor visits, patients may have 1 support person aged 18 or older with them. For treatment visits, patients cannot have anyone with them due to current Covid guidelines and our immunocompromised population.   

## 2020-11-01 NOTE — Progress Notes (Signed)
[facility] Summary Report / Loop Recorder 

## 2020-11-02 ENCOUNTER — Telehealth: Payer: Self-pay

## 2020-11-02 ENCOUNTER — Inpatient Hospital Stay: Payer: Medicare HMO

## 2020-11-02 ENCOUNTER — Other Ambulatory Visit: Payer: Self-pay | Admitting: Internal Medicine

## 2020-11-02 ENCOUNTER — Inpatient Hospital Stay: Payer: Medicare HMO | Attending: Internal Medicine

## 2020-11-02 ENCOUNTER — Other Ambulatory Visit: Payer: Self-pay | Admitting: Physician Assistant

## 2020-11-02 ENCOUNTER — Other Ambulatory Visit: Payer: Self-pay

## 2020-11-02 ENCOUNTER — Telehealth: Payer: Self-pay | Admitting: Internal Medicine

## 2020-11-02 VITALS — BP 131/81 | HR 97 | Resp 16

## 2020-11-02 DIAGNOSIS — Z5111 Encounter for antineoplastic chemotherapy: Secondary | ICD-10-CM | POA: Diagnosis not present

## 2020-11-02 DIAGNOSIS — C3411 Malignant neoplasm of upper lobe, right bronchus or lung: Secondary | ICD-10-CM | POA: Insufficient documentation

## 2020-11-02 DIAGNOSIS — D701 Agranulocytosis secondary to cancer chemotherapy: Secondary | ICD-10-CM

## 2020-11-02 DIAGNOSIS — Z79899 Other long term (current) drug therapy: Secondary | ICD-10-CM | POA: Diagnosis not present

## 2020-11-02 DIAGNOSIS — T451X5A Adverse effect of antineoplastic and immunosuppressive drugs, initial encounter: Secondary | ICD-10-CM | POA: Insufficient documentation

## 2020-11-02 LAB — CBC WITH DIFFERENTIAL (CANCER CENTER ONLY)
Abs Immature Granulocytes: 0.08 10*3/uL — ABNORMAL HIGH (ref 0.00–0.07)
Basophils Absolute: 0.1 10*3/uL (ref 0.0–0.1)
Basophils Relative: 7 %
Eosinophils Absolute: 0 10*3/uL (ref 0.0–0.5)
Eosinophils Relative: 1 %
HCT: 30.7 % — ABNORMAL LOW (ref 39.0–52.0)
Hemoglobin: 10.2 g/dL — ABNORMAL LOW (ref 13.0–17.0)
Immature Granulocytes: 8 %
Lymphocytes Relative: 48 %
Lymphs Abs: 0.5 10*3/uL — ABNORMAL LOW (ref 0.7–4.0)
MCH: 34.7 pg — ABNORMAL HIGH (ref 26.0–34.0)
MCHC: 33.2 g/dL (ref 30.0–36.0)
MCV: 104.4 fL — ABNORMAL HIGH (ref 80.0–100.0)
Monocytes Absolute: 0.3 10*3/uL (ref 0.1–1.0)
Monocytes Relative: 25 %
Neutro Abs: 0.1 10*3/uL — CL (ref 1.7–7.7)
Neutrophils Relative %: 11 %
Platelet Count: 370 10*3/uL (ref 150–400)
RBC: 2.94 MIL/uL — ABNORMAL LOW (ref 4.22–5.81)
RDW: 18.3 % — ABNORMAL HIGH (ref 11.5–15.5)
WBC Count: 1.2 10*3/uL — ABNORMAL LOW (ref 4.0–10.5)
nRBC: 2.6 % — ABNORMAL HIGH (ref 0.0–0.2)

## 2020-11-02 LAB — CMP (CANCER CENTER ONLY)
ALT: 44 U/L (ref 0–44)
AST: 34 U/L (ref 15–41)
Albumin: 3.4 g/dL — ABNORMAL LOW (ref 3.5–5.0)
Alkaline Phosphatase: 93 U/L (ref 38–126)
Anion gap: 11 (ref 5–15)
BUN: 19 mg/dL (ref 8–23)
CO2: 24 mmol/L (ref 22–32)
Calcium: 9.7 mg/dL (ref 8.9–10.3)
Chloride: 106 mmol/L (ref 98–111)
Creatinine: 1.15 mg/dL (ref 0.61–1.24)
GFR, Estimated: >60 mL/min (ref >60)
Glucose, Bld: 122 mg/dL — ABNORMAL HIGH (ref 70–99)
Potassium: 4.1 mmol/L (ref 3.5–5.1)
Sodium: 141 mmol/L (ref 135–145)
Total Bilirubin: 1 mg/dL (ref 0.3–1.2)
Total Protein: 6.9 g/dL (ref 6.5–8.1)

## 2020-11-02 LAB — LACTATE DEHYDROGENASE: LDH: 177 U/L (ref 98–192)

## 2020-11-02 MED ORDER — SODIUM CHLORIDE 0.9 % IV SOLN: 8.0000 mg | Freq: Once | INTRAVENOUS | Status: DC

## 2020-11-02 MED ORDER — FILGRASTIM-SNDZ 300 MCG/0.5ML IJ SOSY
PREFILLED_SYRINGE | INTRAMUSCULAR | Status: AC
  Filled 2020-11-02: qty 0.5

## 2020-11-02 MED ORDER — FILGRASTIM-AAFI 300 MCG/0.5ML IJ SOSY
PREFILLED_SYRINGE | INTRAMUSCULAR | Status: AC
  Filled 2020-11-02: qty 0.5

## 2020-11-02 MED ORDER — ONDANSETRON HCL 4 MG/2ML IJ SOLN
8.0000 mg | Freq: Once | INTRAMUSCULAR | Status: AC
  Administered 2020-11-02: 8 mg via INTRAVENOUS

## 2020-11-02 MED ORDER — FILGRASTIM-SNDZ 300 MCG/0.5ML IJ SOSY
300.0000 ug | PREFILLED_SYRINGE | Freq: Once | INTRAMUSCULAR | Status: AC
  Administered 2020-11-02: 300 ug via SUBCUTANEOUS

## 2020-11-02 MED ORDER — SODIUM CHLORIDE 0.9 % IV SOLN
INTRAVENOUS | Status: AC
  Filled 2020-11-02: qty 250

## 2020-11-02 MED ORDER — SODIUM CHLORIDE 0.9 % IV SOLN
INTRAVENOUS | Status: DC
  Filled 2020-11-02 (×2): qty 250

## 2020-11-02 MED ORDER — ONDANSETRON HCL 4 MG/2ML IJ SOLN: 8.0000 mg | Freq: Once | INTRAMUSCULAR | Status: DC

## 2020-11-02 MED ORDER — ONDANSETRON HCL 4 MG/2ML IJ SOLN
INTRAMUSCULAR | Status: AC
  Filled 2020-11-02: qty 4

## 2020-11-02 NOTE — Telephone Encounter (Signed)
CRITICAL VALUE STICKER  CRITICAL VALUE: ANC = 0.1  RECEIVER (on-site recipient of call): Yetta Glassman, [place] NOTIFIED: 11/02/20 at 9:05am  MESSENGER (representative from lab): Anderson Malta  MD NOTIFIED: Dr. Julien Nordmann  TIME OF NOTIFICATION: 11/02/20 at 9:10am  RESPONSE: Notification given to Dr. Julien Nordmann who advises pt will need Granix for three days. PA request has been sent and is pending at this time.

## 2020-11-02 NOTE — Progress Notes (Signed)
Patient presented for treatment today c/o increased fatigue and nausea. Lab results revealed ANC 0.1. Dr. Julien Nordmann aware. Advised to hold gemcitabine today and administer IV NS bolus/antiemetics/G-CSF. Patient aware of plan of care and agrees.

## 2020-11-02 NOTE — Telephone Encounter (Signed)
Scheduled appts per 8/3 inbasket msg. Called pt, no answer. Left msg with appts dates and times.

## 2020-11-02 NOTE — Progress Notes (Signed)
The following biosimilar Zarxio ([id number]) has been selected for use in this patient.  Kennith Center, Pharm.D., CPP 11/02/2020@10 :12 AM

## 2020-11-02 NOTE — Patient Instructions (Signed)
Rehydration, Adult Rehydration is the replacement of body fluids, salts, and minerals (electrolytes) that are lost during dehydration. Dehydration is when there is not enough water or other fluids in the body. This happens when you lose more fluids than you take in. Common causes of dehydration include: Not drinking enough fluids. This can occur when you are ill or doing activities that require a lot of energy, especially in hot weather. Conditions that cause loss of water or other fluids, such as diarrhea, vomiting, sweating, or urinating a lot. Other illnesses, such as fever or infection. Certain medicines, such as those that remove excess fluid from the body (diuretics). Symptoms of mild or moderate dehydration may include thirst, dry lips and mouth, and dizziness. Symptoms of severe dehydration may include increasedheart rate, confusion, fainting, and not urinating. For severe dehydration, you may need to get fluids through an IV at the hospital. For mild or moderate dehydration, you can usually rehydrate at homeby drinking certain fluids as told by your health care provider. What are the risks? Generally, rehydration is safe. However, taking in too much fluid (overhydration) can be a problem. This is rare. Overhydration can cause an electrolyte imbalance, kidney failure, or a decrease in salt (sodium) levels in the body. Supplies needed You will need an oral rehydration solution (ORS) if your health care provider tells you to use one. This is a drink to treat dehydration. It can be found inpharmacies and retail stores. How to rehydrate Fluids Follow instructions from your health care provider for rehydration. The kind of fluid and the amount you should drink depend on your condition. In general, you should choose drinks that you prefer. If told by your health care provider, drink an ORS. Make an ORS by following instructions on the package. Start by drinking small amounts, about  cup (120 mL)  every 5-10 minutes. Slowly increase how much you drink until you have taken the amount recommended by your health care provider. Drink enough clear fluids to keep your urine pale yellow. If you were told to drink an ORS, finish it first, then start slowly drinking other clear fluids. Drink fluids such as: Water. This includes sparkling water and flavored water. Drinking only water can lead to having too little sodium in your body (hyponatremia). Follow the advice of your health care provider. Water from ice chips you suck on. Fruit juice with water you add to it (diluted). Sports drinks. Hot or cold herbal teas. Broth-based soups. Milk or milk products. Food Follow instructions from your health care provider about what to eat while you rehydrate. Your health care provider may recommend that you slowly begin eating regular foods in small amounts. Eat foods that contain a healthy balance of electrolytes, such as bananas, oranges, potatoes, tomatoes, and spinach. Avoid foods that are greasy or contain a lot of sugar. In some cases, you may get nutrition through a feeding tube that is passed through your nose and into your stomach (nasogastric tube, or NG tube). This may be done if you have uncontrolled vomiting or diarrhea. Beverages to avoid  Certain beverages may make dehydration worse. While you rehydrate, avoiddrinking alcohol. How to tell if you are recovering from dehydration You may be recovering from dehydration if: You are urinating more often than before you started rehydrating. Your urine is pale yellow. Your energy level improves. You vomit less frequently. You have diarrhea less frequently. Your appetite improves or returns to normal. You feel less dizzy or less light-headed. Your skin tone and  color start to look more normal. Follow these instructions at home: Take over-the-counter and prescription medicines only as told by your health care provider. Do not take sodium  tablets. Doing this can lead to having too much sodium in your body (hypernatremia). Contact a health care provider if: You continue to have symptoms of mild or moderate dehydration, such as: Thirst. Dry lips. Slightly dry mouth. Dizziness. Dark urine or less urine than normal. Muscle cramps. You continue to vomit or have diarrhea. Get help right away if you: Have symptoms of dehydration that get worse. Have a fever. Have a severe headache. Have been vomiting and the following happens: Your vomiting gets worse or does not go away. Your vomit includes blood or green matter (bile). You cannot eat or drink without vomiting. Have problems with urination or bowel movements, such as: Diarrhea that gets worse or does not go away. Blood in your stool (feces). This may cause stool to look black and tarry. Not urinating, or urinating only a small amount of very dark urine, within 6-8 hours. Have trouble breathing. Have symptoms that get worse with treatment. These symptoms may represent a serious problem that is an emergency. Do not wait to see if the symptoms will go away. Get medical help right away. Call your local emergency services (911 in the [place] Do not drive yourself to the hospital. Summary Rehydration is the replacement of body fluids and minerals (electrolytes) that are lost during dehydration. Follow instructions from your health care provider for rehydration. The kind of fluid and amount you should drink depend on your condition. Slowly increase how much you drink until you have taken the amount recommended by your health care provider. Contact your health care provider if you continue to show signs of mild or moderate dehydration. This information is not intended to replace advice given to you by your health care provider. Make sure you discuss any questions you have with your healthcare provider. Neutropenia Neutropenia is a condition that occurs when you have a  lower-than-normal level of a type of white blood cell (neutrophil) in your body. Neutrophils are made in the spongy center of large bones (bone marrow), and they fight infections. Neutrophils are your body's main defense against bacterial and fungal infections. The fewer neutrophils you have and the longer your body remainswithout them, the greater your risk of getting a severe infection. What are the causes? This condition can occur if your body uses up or destroys neutrophils faster than your bone marrow can make them. Neutropenia may be caused by: A bacterial or fungal infection. Allergic disorders. Reactions to some medicines. An autoimmune disease. An enlarged spleen. This condition can also occur if your bone marrow does not produce enough neutrophils. This problem may be caused by: Cancer. Cancer treatments, such as radiation or chemotherapy. Viral infections. Medicines, such as phenytoin. Vitamin B12 deficiency. Diseases of the bone marrow. Environmental toxins, such as insecticides. What are the signs or symptoms? This condition does not usually cause symptoms. If symptoms are present, they are usually caused by an underlying infection. Symptoms of an infection may include: Fever. Chills. Swollen glands. Oral or anal ulcers. Cough and shortness of breath. Rash. Skin infection. Fatigue. How is this diagnosed? Your health care provider may suspect neutropenia if you have: A condition that may cause neutropenia. Symptoms during or after treatment for cancer. Symptoms of infection, especially fever. Frequent and unusual infections. This condition is diagnosed based on your medical history and a physical exam. Tests will  also be done, such as: A complete blood count (CBC). A procedure to collect a sample of bone marrow for examination (bone marrow biopsy). A chest X-ray. A urine culture. A blood culture. How is this treated? Treatment depends on the underlying cause and  severity of your condition. Mild neutropenia may not require treatment. Treatment may include medicines, such as: Antibiotic medicine given through an IV. Antiviral medicines. Antifungal medicines. A medicine to increase neutrophil production (colony-stimulating factor). You may get this drug through an IV or by injection. Steroids given through an IV. If an underlying condition is causing neutropenia, you may need treatment for that condition. If medicines or cancer treatments are causing neutropenia, yourhealth care provider may have you stop the medicines or treatment. Follow these instructions at home: Medicines  Take over-the-counter and prescription medicines only as told by your health care provider. Get a seasonal flu shot (influenza vaccine). Avoid people who received a vaccine in the past 30 days if that vaccine contained a live version of the germ (live vaccine). You should not get a live vaccine. Common live vaccines are polio, MMR, chicken pox, and shingles vaccines.  Eating and drinking Do not share food utensils. Do not eat unpasteurized foods. Do not eat raw or undercooked meat, eggs, or seafood. Do not eat unwashed, raw fruits or vegetables. Lifestyle Avoid exposure to groups of people or children. Avoid being around people who are sick. Avoid being around dirt or dust, such as in construction areas or gardens. Do not provide direct care for pets. Avoid animal droppings. Do not clean litter boxes and bird cages. Do not have sex unless your health care provider has approved. Hygiene  Bathe daily. Clean the area between the genitals and the anus (perineal area) after you urinate or have a bowel movement. If you are male, wipe from front to back. Brush your teeth with a soft toothbrush before and after meals. Do not use a regular razor. Use an electric razor to remove hair. Wash your hands often. Make sure others who come in contact with you also wash their hands. If  soap and water are not available, use hand sanitizer.  General instructions Follow any precautions as told by your health care provider to reduce your risk for injury or infection. Take actions to avoid cuts and burns. For example: Be cautious when you use knives. Always cut away from yourself. Keep knives in protective sheaths or guards when not in use. Use oven mitts when you cook with a hot stove, oven, or grill. Stand a safe distance away from open fires. Do not use tampons, enemas, or rectal suppositories unless your health care provider has approved. Keep all follow-up visits as told by your health care provider. This is important. Contact a health care provider if: You have: A sore throat. A warm, red, or tender area on your skin. A cough. Frequent or painful urination. Vaginal discharge or itching. You develop: Sores in your mouth or anus. Swollen lymph nodes. Red streaks on the skin. A rash. Get help right away if: You have: A fever. Chills, or you start to shake. You feel: Nauseous, or you vomit. Very fatigued. Short of breath. Summary Neutropenia is a condition that occurs when you have a lower-than-normal level of a type of white blood cell (neutrophil) in your body. This condition can occur if your body uses up or destroys neutrophils faster than your bone marrow can make them. Treatment depends on the underlying cause and severity of your  condition. Mild neutropenia may not require treatment. Follow any precautions as told by your health care provider to reduce your risk for injury or infection. This information is not intended to replace advice given to you by your health care provider. Make sure you discuss any questions you have with your healthcare provider. Document Revised: 01/02/2018 Document Reviewed: 01/02/2018 Elsevier Patient Education  [employer].

## 2020-11-03 ENCOUNTER — Inpatient Hospital Stay: Payer: Medicare HMO

## 2020-11-03 VITALS — BP 113/74 | HR 97 | Temp 98.9°F | Resp 20

## 2020-11-03 DIAGNOSIS — Z5111 Encounter for antineoplastic chemotherapy: Secondary | ICD-10-CM | POA: Diagnosis not present

## 2020-11-03 DIAGNOSIS — C3411 Malignant neoplasm of upper lobe, right bronchus or lung: Secondary | ICD-10-CM | POA: Diagnosis not present

## 2020-11-03 DIAGNOSIS — Z79899 Other long term (current) drug therapy: Secondary | ICD-10-CM | POA: Diagnosis not present

## 2020-11-03 DIAGNOSIS — D701 Agranulocytosis secondary to cancer chemotherapy: Secondary | ICD-10-CM

## 2020-11-03 MED ORDER — FILGRASTIM-SNDZ 300 MCG/0.5ML IJ SOSY
PREFILLED_SYRINGE | INTRAMUSCULAR | Status: AC
  Filled 2020-11-03: qty 0.5

## 2020-11-03 MED ORDER — FILGRASTIM-SNDZ 300 MCG/0.5ML IJ SOSY
300.0000 ug | PREFILLED_SYRINGE | Freq: Once | INTRAMUSCULAR | Status: AC
  Administered 2020-11-03: 300 ug via SUBCUTANEOUS

## 2020-11-04 ENCOUNTER — Other Ambulatory Visit: Payer: Self-pay

## 2020-11-04 ENCOUNTER — Telehealth: Payer: Self-pay | Admitting: Medical Oncology

## 2020-11-04 ENCOUNTER — Inpatient Hospital Stay: Payer: Medicare HMO

## 2020-11-04 VITALS — BP 126/82 | HR 102 | Temp 99.4°F | Resp 19

## 2020-11-04 DIAGNOSIS — Z79899 Other long term (current) drug therapy: Secondary | ICD-10-CM | POA: Diagnosis not present

## 2020-11-04 DIAGNOSIS — C3411 Malignant neoplasm of upper lobe, right bronchus or lung: Secondary | ICD-10-CM | POA: Diagnosis not present

## 2020-11-04 DIAGNOSIS — D701 Agranulocytosis secondary to cancer chemotherapy: Secondary | ICD-10-CM

## 2020-11-04 DIAGNOSIS — Z5111 Encounter for antineoplastic chemotherapy: Secondary | ICD-10-CM | POA: Diagnosis not present

## 2020-11-04 MED ORDER — FILGRASTIM-SNDZ 300 MCG/0.5ML IJ SOSY
PREFILLED_SYRINGE | INTRAMUSCULAR | Status: AC
  Filled 2020-11-04: qty 0.5

## 2020-11-04 MED ORDER — FILGRASTIM-SNDZ 300 MCG/0.5ML IJ SOSY
300.0000 ug | PREFILLED_SYRINGE | Freq: Once | INTRAMUSCULAR | Status: AC
  Administered 2020-11-04: 300 ug via SUBCUTANEOUS

## 2020-11-04 NOTE — Telephone Encounter (Signed)
Zarxio 300 mcg/0.53ml is  approved.

## 2020-11-04 NOTE — Patient Instructions (Signed)
Filgrastim, G-CSF injection What is this medication? FILGRASTIM, G-CSF (fil GRA stim) is a granulocyte colony-stimulating factor that stimulates the growth of neutrophils, a type of white blood cell (WBC) important in the body's fight against infection. It is used to reduce the incidence of fever and infection in patients with certain types of cancer who are receiving chemotherapy that affects the bone marrow, to stimulate blood cell production for removal of WBCs from the body prior to a bone marrow transplantation, to reduce the incidence of fever and infection in patients who have severe chronic neutropenia, and to improve survival outcomes following high-dose radiation exposure that is toxic to the bone marrow. This medicine may be used for other purposes; ask your health care provider or pharmacist if you have questions. COMMON BRAND NAME(S): Neupogen, Nivestym, Releuko, Zarxio What should I tell my care team before I take this medication? They need to know if you have any of these conditions: kidney disease latex allergy ongoing radiation therapy sickle cell disease an unusual or allergic reaction to filgrastim, pegfilgrastim, other medicines, foods, dyes, or preservatives pregnant or trying to get pregnant breast-feeding How should I use this medication? This medicine is for injection under the skin or infusion into a vein. As an infusion into a vein, it is usually given by a health care professional in a hospital or clinic setting. If you get this medicine at home, you will be taught how to prepare and give this medicine. Refer to the Instructions for Use that come with your medication packaging. Use exactly as directed. Take your medicine at regular intervals. Do not take your medicine more often than directed. It is important that you put your used needles and syringes in a special sharps container. Do not put them in a trash can. If you do not have a sharps container, call your pharmacist  or healthcare provider to get one. Talk to your pediatrician regarding the use of this medicine in children. While this drug may be prescribed for children as young as 7 months for selected conditions, precautions do apply. Overdosage: If you think you have taken too much of this medicine contact a poison control center or emergency room at once. NOTE: This medicine is only for you. Do not share this medicine with others. What if I miss a dose? It is important not to miss your dose. Call your doctor or health care professional if you miss a dose. What may interact with this medication? This medicine may interact with the following medications: medicines that may cause a release of neutrophils, such as lithium This list may not describe all possible interactions. Give your health care provider a list of all the medicines, herbs, non-prescription drugs, or dietary supplements you use. Also tell them if you smoke, drink alcohol, or use illegal drugs. Some items may interact with your medicine. What should I watch for while using this medication? Your condition will be monitored carefully while you are receiving this medicine. You may need blood work done while you are taking this medicine. Talk to your health care provider about your risk of cancer. You may be more at risk for certain types of cancer if you take this medicine. What side effects may I notice from receiving this medication? Side effects that you should report to your doctor or health care professional as soon as possible: allergic reactions like skin rash, itching or hives, swelling of the face, lips, or tongue back pain dizziness or feeling faint fever pain, redness, or   irritation at site where injected pinpoint red spots on the skin shortness of breath or breathing problems signs and symptoms of kidney injury like trouble passing urine, change in the amount of urine, or red or dark-brown urine stomach or side pain, or pain at  the shoulder swelling tiredness unusual bleeding or bruising Side effects that usually do not require medical attention (report to your doctor or health care professional if they continue or are bothersome): bone pain cough diarrhea hair loss headache muscle pain This list may not describe all possible side effects. Call your doctor for medical advice about side effects. You may report side effects to FDA at 1-800-FDA-1088. Where should I keep my medication? Keep out of the reach of children. Store in a refrigerator between 2 and 8 degrees C (36 and 46 degrees F). Do not freeze. Keep in carton to protect from light. Throw away this medicine if vials or syringes are left out of the refrigerator for more than 24 hours. Throw away any unused medicine after the expiration date. NOTE: This sheet is a summary. It may not cover all possible information. If you have questions about this medicine, talk to your doctor, pharmacist, or health care provider.  2022 Elsevier/Gold Standard (2019-04-09 18:47:55)  

## 2020-11-06 ENCOUNTER — Other Ambulatory Visit: Payer: Self-pay | Admitting: Family Medicine

## 2020-11-06 DIAGNOSIS — E119 Type 2 diabetes mellitus without complications: Secondary | ICD-10-CM

## 2020-11-06 MED ORDER — METFORMIN HCL 500 MG PO TABS: ORAL_TABLET | ORAL | 3 refills | Status: AC

## 2020-11-14 ENCOUNTER — Other Ambulatory Visit: Payer: Self-pay

## 2020-11-14 ENCOUNTER — Ambulatory Visit (INDEPENDENT_AMBULATORY_CARE_PROVIDER_SITE_OTHER): Payer: Medicare HMO

## 2020-11-14 ENCOUNTER — Ambulatory Visit (HOSPITAL_COMMUNITY)
Admission: RE | Admit: 2020-11-14 | Discharge: 2020-11-14 | Disposition: A | Discharge status: Home / Self Care | Payer: Medicare HMO | Source: Ambulatory Visit | Attending: Physician Assistant | Admitting: Physician Assistant

## 2020-11-14 DIAGNOSIS — C7801 Secondary malignant neoplasm of right lung: Secondary | ICD-10-CM | POA: Diagnosis not present

## 2020-11-14 DIAGNOSIS — I639 Cerebral infarction, unspecified: Secondary | ICD-10-CM

## 2020-11-14 DIAGNOSIS — C3411 Malignant neoplasm of upper lobe, right bronchus or lung: Secondary | ICD-10-CM | POA: Diagnosis not present

## 2020-11-14 DIAGNOSIS — R911 Solitary pulmonary nodule: Secondary | ICD-10-CM | POA: Diagnosis not present

## 2020-11-14 DIAGNOSIS — J9811 Atelectasis: Secondary | ICD-10-CM | POA: Diagnosis not present

## 2020-11-14 DIAGNOSIS — I313 Pericardial effusion (noninflammatory): Secondary | ICD-10-CM | POA: Diagnosis not present

## 2020-11-14 DIAGNOSIS — J90 Pleural effusion, not elsewhere classified: Secondary | ICD-10-CM | POA: Diagnosis not present

## 2020-11-14 DIAGNOSIS — K5730 Diverticulosis of large intestine without perforation or abscess without bleeding: Secondary | ICD-10-CM | POA: Diagnosis not present

## 2020-11-14 MED ORDER — IOHEXOL 350 MG/ML SOLN
80.0000 mL | Freq: Once | INTRAVENOUS | Status: AC | PRN
  Administered 2020-11-14: 80 mL via INTRAVENOUS

## 2020-11-15 ENCOUNTER — Inpatient Hospital Stay (HOSPITAL_BASED_OUTPATIENT_CLINIC_OR_DEPARTMENT_OTHER): Payer: Medicare HMO | Admitting: Internal Medicine

## 2020-11-15 ENCOUNTER — Inpatient Hospital Stay: Payer: Medicare HMO

## 2020-11-15 VITALS — BP 141/95 | HR 90 | Temp 97.4°F | Resp 20 | Ht 74.0 in | Wt 170.7 lb

## 2020-11-15 DIAGNOSIS — Z79899 Other long term (current) drug therapy: Secondary | ICD-10-CM | POA: Diagnosis not present

## 2020-11-15 DIAGNOSIS — Z5111 Encounter for antineoplastic chemotherapy: Secondary | ICD-10-CM | POA: Diagnosis not present

## 2020-11-15 DIAGNOSIS — T451X5A Adverse effect of antineoplastic and immunosuppressive drugs, initial encounter: Secondary | ICD-10-CM

## 2020-11-15 DIAGNOSIS — C3411 Malignant neoplasm of upper lobe, right bronchus or lung: Secondary | ICD-10-CM | POA: Diagnosis not present

## 2020-11-15 DIAGNOSIS — D701 Agranulocytosis secondary to cancer chemotherapy: Secondary | ICD-10-CM

## 2020-11-15 LAB — CBC WITH DIFFERENTIAL (CANCER CENTER ONLY)
Abs Immature Granulocytes: 0.1 10*3/uL — ABNORMAL HIGH (ref 0.00–0.07)
Basophils Absolute: 0.1 10*3/uL (ref 0.0–0.1)
Basophils Relative: 1 %
Eosinophils Absolute: 0 10*3/uL (ref 0.0–0.5)
Eosinophils Relative: 0 %
HCT: 32.5 % — ABNORMAL LOW (ref 39.0–52.0)
Hemoglobin: 10.8 g/dL — ABNORMAL LOW (ref 13.0–17.0)
Immature Granulocytes: 1 %
Lymphocytes Relative: 6 %
Lymphs Abs: 0.6 10*3/uL — ABNORMAL LOW (ref 0.7–4.0)
MCH: 35.6 pg — ABNORMAL HIGH (ref 26.0–34.0)
MCHC: 33.2 g/dL (ref 30.0–36.0)
MCV: 107.3 fL — ABNORMAL HIGH (ref 80.0–100.0)
Monocytes Absolute: 0.9 10*3/uL (ref 0.1–1.0)
Monocytes Relative: 9 %
Neutro Abs: 8 10*3/uL — ABNORMAL HIGH (ref 1.7–7.7)
Neutrophils Relative %: 83 %
Platelet Count: 355 10*3/uL (ref 150–400)
RBC: 3.03 MIL/uL — ABNORMAL LOW (ref 4.22–5.81)
RDW: 19.9 % — ABNORMAL HIGH (ref 11.5–15.5)
WBC Count: 9.7 10*3/uL (ref 4.0–10.5)
nRBC: 0 % (ref 0.0–0.2)

## 2020-11-15 LAB — CMP (CANCER CENTER ONLY)
ALT: 14 U/L (ref 0–44)
AST: 16 U/L (ref 15–41)
Albumin: 3.3 g/dL — ABNORMAL LOW (ref 3.5–5.0)
Alkaline Phosphatase: 83 U/L (ref 38–126)
Anion gap: 8 (ref 5–15)
BUN: 17 mg/dL (ref 8–23)
CO2: 25 mmol/L (ref 22–32)
Calcium: 9.3 mg/dL (ref 8.9–10.3)
Chloride: 107 mmol/L (ref 98–111)
Creatinine: 1.05 mg/dL (ref 0.61–1.24)
GFR, Estimated: >60 mL/min (ref >60)
Glucose, Bld: 101 mg/dL — ABNORMAL HIGH (ref 70–99)
Potassium: 4.3 mmol/L (ref 3.5–5.1)
Sodium: 140 mmol/L (ref 135–145)
Total Bilirubin: 0.7 mg/dL (ref 0.3–1.2)
Total Protein: 6.7 g/dL (ref 6.5–8.1)

## 2020-11-15 MED ORDER — METHYLPREDNISOLONE 4 MG PO TBPK: ORAL_TABLET | ORAL | 0 refills | Status: DC

## 2020-11-15 MED ORDER — PROCHLORPERAZINE MALEATE 10 MG PO TABS: 10.0000 mg | ORAL_TABLET | Freq: Once | ORAL | Status: DC

## 2020-11-15 MED ORDER — HYDROCODONE BIT-HOMATROP MBR 5-1.5 MG/5ML PO SOLN
5.0000 mL | Freq: Four times a day (QID) | ORAL | 0 refills | Status: DC | PRN
Start: 2020-11-15 — End: 2020-12-07

## 2020-11-15 MED ORDER — SODIUM CHLORIDE 0.9 % IV SOLN
800.0000 mg/m2 | Freq: Once | INTRAVENOUS | Status: AC
  Administered 2020-11-15: 1634 mg via INTRAVENOUS
  Filled 2020-11-15: qty 42.98

## 2020-11-15 MED ORDER — SODIUM CHLORIDE 0.9 % IV SOLN: Freq: Once | INTRAVENOUS | Status: AC

## 2020-11-15 NOTE — Patient Instructions (Signed)
[facility]  Discharge Instructions: Thank you for choosing [facility] to provide your oncology and hematology care.   If you have a lab appointment with the [facility], please go directly to the [facility] and check in at the registration area.   Wear comfortable clothing and clothing appropriate for easy access to any Portacath or PICC line.   We strive to give you quality time with your provider. You may need to reschedule your appointment if you arrive late (15 or more minutes).  Arriving late affects you and other patients whose appointments are after yours.  Also, if you miss three or more appointments without notifying the office, you may be dismissed from the clinic at the provider's discretion.      For prescription refill requests, have your pharmacy contact our office and allow 72 hours for refills to be completed.    Today you received the following chemotherapy and/or immunotherapy agent: Gemcitabine (Gemzar)   To help prevent nausea and vomiting after your treatment, we encourage you to take your nausea medication as directed.  BELOW ARE SYMPTOMS THAT SHOULD BE REPORTED IMMEDIATELY: *FEVER GREATER THAN 100.4 F (38 C) OR HIGHER *CHILLS OR SWEATING *NAUSEA AND VOMITING THAT IS NOT CONTROLLED WITH YOUR NAUSEA MEDICATION *UNUSUAL SHORTNESS OF BREATH *UNUSUAL BRUISING OR BLEEDING *URINARY PROBLEMS (pain or burning when urinating, or frequent urination) *BOWEL PROBLEMS (unusual diarrhea, constipation, pain near the anus) TENDERNESS IN MOUTH AND THROAT WITH OR WITHOUT PRESENCE OF ULCERS (sore throat, sores in mouth, or a toothache) UNUSUAL RASH, SWELLING OR PAIN  UNUSUAL VAGINAL DISCHARGE OR ITCHING   Items with * indicate a potential emergency and should be followed up as soon as possible or go to the Emergency Department if any problems should occur.  Please show the CHEMOTHERAPY ALERT CARD or IMMUNOTHERAPY ALERT CARD at  check-in to the Emergency Department and triage nurse.  Should you have questions after your visit or need to cancel or reschedule your appointment, please contact [facility]  Dept: [phone]  and follow the prompts.  Office hours are 8:00 a.m. to 4:30 p.m. Monday - Friday. Please note that voicemails left after 4:00 p.m. may not be returned until the following business day.  We are closed weekends and major holidays. You have access to a nurse at all times for urgent questions. Please call the main number to the clinic Dept: [phone] and follow the prompts.   For any non-urgent questions, you may also contact your provider using MyChart. We now offer e-Visits for anyone 7 and older to request care online for non-urgent symptoms. For details visit [email or web address].   Also download the MyChart app! Go to the app store, search "MyChart", open the app, select [facility], and log in with your MyChart username and password.  Due to Covid, a mask is required upon entering the hospital/clinic. If you do not have a mask, one will be given to you upon arrival. For doctor visits, patients may have 1 support person aged 53 or older with them. For treatment visits, patients cannot have anyone with them due to current Covid guidelines and our immunocompromised population.

## 2020-11-15 NOTE — Progress Notes (Signed)
[place] [phone]   [phone]  OFFICE PROGRESS NOTE  Copland, Gay Filler, MD [address] [place] [address]  DIAGNOSIS: Stage IV (T3, N2, M1a) non-small cell lung cancer, adenocarcinoma presented with large right upper lobe lung mass extending to the right suprahilar region in addition to right hilar and mediastinal lymphadenopathy with obstruction of the associated upper lobe bronchus and bilateral subcentimeter pulmonary nodules diagnosed in July 2021.  Biomarker Findings Microsatellite status - MS-Stable Tumor Mutational Burden - 3 Muts/Mb Genomic Findings For a complete list of the genes assayed, please refer to the Appendix. ERBB2 [id number], amplification - equivocal? HGF amplification - equivocal? MTAP loss exons 5-8 MYC amplification - equivocal? CDKN2A/B CDKN2B loss, CDKN2A loss CUL3 Y75f*19 RPTOR amplification TP53 [id number] Disease relevant genes with no reportable alterations: ALK, BRAF, EGFR, KRAS, MET, RET, ROS1  PDL1 Expression: Negative  PRIOR THERAPY:  1) Palliative radiotherapy to the obstructive lung mass under the care of Dr. MoLisbeth RenshawThe past treatment was scheduled for 11/23/19. 2) Systemic chemotherapy with carboplatin for AUC of 5, Alimta 500 mg/M2 and Keytruda 200 mg IV every 3 weeks.  First dose November 04, 2019.  Status post 9 cycles.  Starting from cycle #5 he will be on maintenance treatment with Alimta and Keytruda every 3 weeks. 3) Second line systemic chemotherapy with docetaxel 75 mg/M2 and Cyramza 10 mg/KG every weeks with Neulasta support.  First dose May 18, 2020.  Status post 2 cycles.  Last dose was given June 17, 2020 discontinued secondary to disease progression.  CURRENT THERAPY: Third line systemic chemotherapy with single agent gemcitabine 1000 mg/M2 on days 1 and 8 every 3 weeks.  First dose July 13, 2020.  Status post 6 cycles.  Starting from cycle #7 his dose  of gemcitabine was reduced to 800 Mg/M2 secondary to chemotherapy-induced neutropenia.  INTERVAL HISTORY: WiKirsten [age]ckone[age] returns to the clinic today for follow-up visit accompanied by his wife.  The patient continues to complain of increasing fatigue and weakness as well as weight loss.  He is trying Ensure once daily.  He denied having any current chest pain but has shortness of breath with exertion with dry cough and no hemoptysis.  He denied having any fever or chills.  He has intermittent nausea, but no vomiting, diarrhea or constipation.  He has no headache or visual changes.  He continues to tolerate his treatment with single agent gemcitabine well except for the fatigue.  He also required filgrastim injection for chemotherapy-induced neutropenia 2 weeks ago.  He had repeat CT scan of the chest, abdomen pelvis performed recently and he is here for evaluation and discussion of his scan results before starting cycle #7.  MEDICAL HISTORY: Past Medical History:  Diagnosis Date   Chicken pox    DM2 [place], [place] 2) [place]   History of kidney stones    passed several    Hypertension    Kidney stones    5-6 times   Measles    Mumps    [place] [place]   [place], last one was 09/18/2019- has some expressive aphasia    ALLERGIES:  is allergic to bee venom.  MEDICATIONS:  Current Outpatient Medications  Medication Sig Dispense Refill   acetaminophen (TYLENOL) 500 MG tablet Take 500 mg by mouth every 6 (six) hours as needed.     albuterol (VENTOLIN HFA) 108 (90 Base) MCG/ACT inhaler Inhale 2  puffs into the lungs every 6 (six) hours as needed for wheezing or shortness of breath. 8 g 2   apixaban (ELIQUIS) 5 MG TABS tablet Take 1 tablet (5 mg total) by mouth 2 (two) times daily. 60 tablet 11   atorvastatin (LIPITOR) 40 MG tablet TAKE ONE TABLET BY MOUTH ONE TIME DAILY 6PM 90 tablet 3   diltiazem (CARDIZEM CD) 180 MG 24 hr capsule Take 1 capsule (180 mg total) by mouth daily.  30 capsule 6   HYDROcodone bit-homatropine (HYCODAN) 5-1.5 MG/5ML syrup Take 5 mLs by mouth every 6 (six) hours as needed for cough. 120 mL 0   metFORMIN (GLUCOPHAGE) 500 MG tablet TAKE TWO TABLETS BY MOUTH TWICE A DAY WITH MEAL(S) 400 tablet 3   Omega-3 Fatty Acids (FISH OIL) 1200 MG CAPS Take by mouth daily.     prochlorperazine (COMPAZINE) 10 MG tablet Take 1 tablet (10 mg total) by mouth every 6 (six) hours as needed. 30 tablet 2   No current facility-administered medications for this visit.    SURGICAL HISTORY:  Past Surgical History:  Procedure Laterality Date   COLONOSCOPY W/ POLYPECTOMY     VIDEO BRONCHOSCOPY WITH ENDOBRONCHIAL ULTRASOUND N/A 09/30/2019   Procedure: VIDEO BRONCHOSCOPY WITH ENDOBRONCHIAL ULTRASOUND;  Surgeon: Collene Gobble, MD;  Location: [facility];  Service: Thoracic;  Laterality: N/A;   WISDOM TOOTH EXTRACTION  1976-77    REVIEW OF SYSTEMS:  Constitutional: positive for anorexia, fatigue, and weight loss Eyes: negative Ears, nose, mouth, throat, and face: negative Respiratory: positive for cough and dyspnea on exertion Cardiovascular: negative Gastrointestinal: positive for nausea Genitourinary:negative Integument/breast: negative Hematologic/lymphatic: negative Musculoskeletal:negative Neurological: negative Behavioral/Psych: positive for sleep disturbance Endocrine: negative Allergic/Immunologic: negative   PHYSICAL EXAMINATION: General appearance: alert, cooperative, fatigued, and no distress Head: Normocephalic, without obvious abnormality, atraumatic, Right eyelid droop Neck: no adenopathy, no JVD, supple, symmetrical, trachea midline, and thyroid not enlarged, symmetric, no tenderness/mass/nodules Lymph nodes: Cervical, supraclavicular, and axillary nodes normal. Resp: diminished breath sounds RLL and dullness to percussion RLL Back: symmetric, no curvature. ROM normal. No CVA tenderness. Cardio: regular rate and rhythm, S1, S2 normal, no murmur,  click, rub or gallop GI: soft, non-tender; bowel sounds normal; no masses,  no organomegaly Extremities: extremities normal, atraumatic, no cyanosis or edema Neurologic: Alert and oriented X 3, normal strength and tone. Normal symmetric reflexes. Normal coordination and gait  ECOG PERFORMANCE STATUS: 1 - Symptomatic but completely ambulatory  Blood pressure (!) 141/95, pulse 90, temperature (!) 97.4 F (36.3 C), temperature source Tympanic, resp. rate 20, height 6' 2"  (1.88 m), weight 170 lb 11.2 oz (77.4 kg), SpO2 97 %.  LABORATORY DATA: Lab Results  Component Value Date   WBC 9.7 11/15/2020   HGB 10.8 (L) 11/15/2020   HCT 32.5 (L) 11/15/2020   MCV 107.3 (H) 11/15/2020   PLT 355 11/15/2020      Chemistry      Component Value Date/Time   NA 140 11/15/2020 1254   K 4.3 11/15/2020 1254   CL 107 11/15/2020 1254   CO2 25 11/15/2020 1254   BUN 17 11/15/2020 1254   CREATININE 1.05 11/15/2020 1254   CREATININE 1.13 10/16/2013 1118      Component Value Date/Time   CALCIUM 9.3 11/15/2020 1254   ALKPHOS 83 11/15/2020 1254   AST 16 11/15/2020 1254   ALT 14 11/15/2020 1254   BILITOT 0.7 11/15/2020 1254       RADIOGRAPHIC STUDIES: CT Chest W Contrast  Result Date: 11/15/2020 CLINICAL DATA:  Follow-up metastatic right upper lobe lung adenocarcinoma. EXAM: CT CHEST, ABDOMEN, AND PELVIS WITH CONTRAST TECHNIQUE: Multidetector CT imaging of the chest, abdomen and pelvis was performed following the standard protocol during bolus administration of intravenous contrast. CONTRAST:  99m OMNIPAQUE IOHEXOL 350 MG/ML SOLN COMPARISON:  09/12/2020 FINDINGS: CT CHEST FINDINGS Cardiovascular: No acute findings. Stable small pericardial effusion. Mediastinum/Lymph Nodes: No masses or pathologically enlarged lymph nodes identified. Lungs/Pleura: Large right pleural effusion and small left pleural effusion are unchanged in size. Compressive atelectasis again seen in the right lower lung. Paramediastinal  radiation changes are again seen, however there is a persistent low-attenuation masslike soft tissue density in the central right upper lobe measuring approximately 4.0 x 2.6 cm, image 25/2), which shows no significant change since previous study. Numerous small pulmonary nodules are again seen in both lungs, which also show no significant change in size or number since previous study. Largest index nodule in the left lower lobe measures 1.5 x 1.3 cm on image 102/4. Musculoskeletal:  No suspicious bone lesions identified. CT ABDOMEN AND PELVIS FINDINGS Hepatobiliary: No masses identified. Gallbladder is unremarkable. No evidence of biliary ductal dilatation. Pancreas:  No mass or inflammatory changes. Spleen:  Within normal limits in size and appearance. Adrenals/Urinary tract: No masses or hydronephrosis. A few tiny less than 5 mm right renal calculi are again seen. A 2.3 cm calculus is again seen within the urinary bladder. Stomach/Bowel: No evidence of obstruction, inflammatory process, or abnormal fluid collections. Diffuse colonic diverticulosis is again seen, however there is no evidence of diverticulitis. Vascular/Lymphatic: No pathologically enlarged lymph nodes identified. No acute vascular findings. Reproductive:  No mass or other significant abnormality identified. Other:  None. Musculoskeletal:  No suspicious bone lesions identified. IMPRESSION: Stable bilateral pulmonary metastases. Stable soft tissue density in central right upper lobe. Stable large right pleural effusion and small left pleural effusion. Stable small pericardial effusion. No new or progressive disease within the thorax. No evidence of abdominal or pelvic metastatic disease. Colonic diverticulosis, without radiographic evidence of diverticulitis. Nonobstructing right nephrolithiasis and large urinary bladder calculus. Electronically Signed   By: JMarlaine HindM.D.   On: 11/15/2020 10:14   CT Abdomen Pelvis W Contrast  Result Date:  11/15/2020 CLINICAL DATA:  Follow-up metastatic right upper lobe lung adenocarcinoma. EXAM: CT CHEST, ABDOMEN, AND PELVIS WITH CONTRAST TECHNIQUE: Multidetector CT imaging of the chest, abdomen and pelvis was performed following the standard protocol during bolus administration of intravenous contrast. CONTRAST:  8107mOMNIPAQUE IOHEXOL 350 MG/ML SOLN COMPARISON:  09/12/2020 FINDINGS: CT CHEST FINDINGS Cardiovascular: No acute findings. Stable small pericardial effusion. Mediastinum/Lymph Nodes: No masses or pathologically enlarged lymph nodes identified. Lungs/Pleura: Large right pleural effusion and small left pleural effusion are unchanged in size. Compressive atelectasis again seen in the right lower lung. Paramediastinal radiation changes are again seen, however there is a persistent low-attenuation masslike soft tissue density in the central right upper lobe measuring approximately 4.0 x 2.6 cm, image 25/2), which shows no significant change since previous study. Numerous small pulmonary nodules are again seen in both lungs, which also show no significant change in size or number since previous study. Largest index nodule in the left lower lobe measures 1.5 x 1.3 cm on image 102/4. Musculoskeletal:  No suspicious bone lesions identified. CT ABDOMEN AND PELVIS FINDINGS Hepatobiliary: No masses identified. Gallbladder is unremarkable. No evidence of biliary ductal dilatation. Pancreas:  No mass or inflammatory changes. Spleen:  Within normal limits in size and appearance. Adrenals/Urinary tract: No masses or  hydronephrosis. A few tiny less than 5 mm right renal calculi are again seen. A 2.3 cm calculus is again seen within the urinary bladder. Stomach/Bowel: No evidence of obstruction, inflammatory process, or abnormal fluid collections. Diffuse colonic diverticulosis is again seen, however there is no evidence of diverticulitis. Vascular/Lymphatic: No pathologically enlarged lymph nodes identified. No acute  vascular findings. Reproductive:  No mass or other significant abnormality identified. Other:  None. Musculoskeletal:  No suspicious bone lesions identified. IMPRESSION: Stable bilateral pulmonary metastases. Stable soft tissue density in central right upper lobe. Stable large right pleural effusion and small left pleural effusion. Stable small pericardial effusion. No new or progressive disease within the thorax. No evidence of abdominal or pelvic metastatic disease. Colonic diverticulosis, without radiographic evidence of diverticulitis. Nonobstructing right nephrolithiasis and large urinary bladder calculus. Electronically Signed   By: Marlaine Hind M.D.   On: 11/15/2020 10:14     ASSESSMENT AND PLAN: This is a very pleasant 70 years old white male recently diagnosed with a stage IV (T3, N2, M1 a) non-small cell lung cancer, adenocarcinoma presented with large right upper lobe lung mass with extension to the right suprahilar region as well as right hilar and mediastinal lymphadenopathy with obstruction of the right upper lobe and bilateral pulmonary nodules diagnosed in July 2021. The molecular study shows no actionable mutations and PD-L1 expression is negative. He had a short course of palliative radiotherapy to the obstructive lung mass under the care of Dr. Lisbeth Renshaw. The patient underwent systemic chemotherapy with carboplatin for AUC of 5, Alimta 500 mg/M2 and Keytruda 200 mg IV every 3 weeks status post 9 cycles.  Starting from cycle #5 he will be on maintenance treatment with Alimta and Keytruda every 3 weeks.  He has been tolerating this treatment well with no concerning complaints except for fatigue and cough.  This treatment was discontinued secondary to disease progression. The patient underwent palliative second line systemic chemotherapy with docetaxel 75 mg/M2 and Cyramza 10 mg/KG every 3 weeks with Neulasta support.  Started May 18, 2020.  Status post 2 cycles. Unfortunately his scan  showed evidence for disease progression and multiple bilateral pulmonary nodules but stable disease in the dominant mass and right pleural effusion. He is currently undergoing palliative systemic chemotherapy with single agent gemcitabine 1000 MG/M2 IV on days 1 and 8 every 3 weeks.  Status post 6 cycles.  The patient has been tolerating this treatment well except for fatigue and the chemotherapy-induced neutropenia after cycle #6. He had repeat CT scan of the chest, abdomen pelvis performed recently.  I personally and independently reviewed the scan and discussed the result with the patient and his wife. His scan showed stable disease with persistent enlargement of the right pleural effusion and small left pleural effusion. I recommended for the patient to continue his current treatment with gemcitabine but I will reduce the dose to 800 Mg/M2 on days 1 and 8 every 3 weeks because of the fatigue and the chemotherapy-induced neutropenia. For the enlarged right pleural effusion, I recommended for the patient to proceed with ultrasound-guided thoracentesis but he would like to hold on it for now if he has more symptoms. For the lack of appetite and fatigue, I will give him a Medrol Dosepak. For the dry cough I will give him refill of Hycodan. The patient will come back for follow-up visit in 3 weeks for evaluation before starting cycle #8. He was advised to call immediately if he has any other concerning symptoms in the  interval. The patient voices understanding of current disease status and treatment options and is in agreement with the current care plan.  All questions were answered. The patient knows to call the clinic with any problems, questions or concerns. We can certainly see the patient much sooner if necessary.  Disclaimer: This note was dictated with voice recognition software. Similar sounding words can inadvertently be transcribed and may not be corrected upon review.

## 2020-11-15 NOTE — Progress Notes (Signed)
Pt stated he took his compazine prior to his arrival to infusion today.

## 2020-11-16 LAB — CUP PACEART REMOTE DEVICE CHECK
Date Time Interrogation Session: 20220815104634
Implantable Pulse Generator Implant Date: 20210907

## 2020-11-23 ENCOUNTER — Inpatient Hospital Stay: Payer: Medicare HMO

## 2020-11-23 ENCOUNTER — Other Ambulatory Visit: Payer: Self-pay

## 2020-11-23 VITALS — BP 151/97 | HR 85 | Temp 98.0°F | Resp 16 | Wt 165.8 lb

## 2020-11-23 DIAGNOSIS — C3411 Malignant neoplasm of upper lobe, right bronchus or lung: Secondary | ICD-10-CM | POA: Diagnosis not present

## 2020-11-23 DIAGNOSIS — Z79899 Other long term (current) drug therapy: Secondary | ICD-10-CM | POA: Diagnosis not present

## 2020-11-23 DIAGNOSIS — Z5111 Encounter for antineoplastic chemotherapy: Secondary | ICD-10-CM | POA: Diagnosis not present

## 2020-11-23 LAB — CMP (CANCER CENTER ONLY)
ALT: 33 U/L (ref 0–44)
AST: 26 U/L (ref 15–41)
Albumin: 3.4 g/dL — ABNORMAL LOW (ref 3.5–5.0)
Alkaline Phosphatase: 69 U/L (ref 38–126)
Anion gap: 10 (ref 5–15)
BUN: 31 mg/dL — ABNORMAL HIGH (ref 8–23)
CO2: 25 mmol/L (ref 22–32)
Calcium: 9.3 mg/dL (ref 8.9–10.3)
Chloride: 106 mmol/L (ref 98–111)
Creatinine: 0.91 mg/dL (ref 0.61–1.24)
GFR, Estimated: >60 mL/min (ref >60)
Glucose, Bld: 93 mg/dL (ref 70–99)
Potassium: 4.3 mmol/L (ref 3.5–5.1)
Sodium: 141 mmol/L (ref 135–145)
Total Bilirubin: 0.8 mg/dL (ref 0.3–1.2)
Total Protein: 6.6 g/dL (ref 6.5–8.1)

## 2020-11-23 LAB — CBC WITH DIFFERENTIAL (CANCER CENTER ONLY)
Abs Immature Granulocytes: 0.28 10*3/uL — ABNORMAL HIGH (ref 0.00–0.07)
Basophils Absolute: 0.1 10*3/uL (ref 0.0–0.1)
Basophils Relative: 1 %
Eosinophils Absolute: 0.1 10*3/uL (ref 0.0–0.5)
Eosinophils Relative: 1 %
HCT: 31.5 % — ABNORMAL LOW (ref 39.0–52.0)
Hemoglobin: 10.8 g/dL — ABNORMAL LOW (ref 13.0–17.0)
Immature Granulocytes: 3 %
Lymphocytes Relative: 7 %
Lymphs Abs: 0.6 10*3/uL — ABNORMAL LOW (ref 0.7–4.0)
MCH: 35.8 pg — ABNORMAL HIGH (ref 26.0–34.0)
MCHC: 34.3 g/dL (ref 30.0–36.0)
MCV: 104.3 fL — ABNORMAL HIGH (ref 80.0–100.0)
Monocytes Absolute: 1.1 10*3/uL — ABNORMAL HIGH (ref 0.1–1.0)
Monocytes Relative: 12 %
Neutro Abs: 6.8 10*3/uL (ref 1.7–7.7)
Neutrophils Relative %: 76 %
Platelet Count: 217 10*3/uL (ref 150–400)
RBC: 3.02 MIL/uL — ABNORMAL LOW (ref 4.22–5.81)
RDW: 18 % — ABNORMAL HIGH (ref 11.5–15.5)
WBC Count: 9 10*3/uL (ref 4.0–10.5)
nRBC: 0.4 % — ABNORMAL HIGH (ref 0.0–0.2)

## 2020-11-23 LAB — LACTATE DEHYDROGENASE: LDH: 168 U/L (ref 98–192)

## 2020-11-23 MED ORDER — PROCHLORPERAZINE MALEATE 10 MG PO TABS: 10.0000 mg | ORAL_TABLET | Freq: Once | ORAL | Status: DC

## 2020-11-23 MED ORDER — SODIUM CHLORIDE 0.9 % IV SOLN: Freq: Once | INTRAVENOUS | Status: AC

## 2020-11-23 MED ORDER — COLD PACK MISC ONCOLOGY: 1.0000 | Freq: Once | Status: DC | PRN

## 2020-11-23 MED ORDER — SODIUM CHLORIDE 0.9 % IV SOLN
800.0000 mg/m2 | Freq: Once | INTRAVENOUS | Status: AC
  Administered 2020-11-23: 1634 mg via INTRAVENOUS
  Filled 2020-11-23: qty 42.98

## 2020-11-23 NOTE — Patient Instructions (Addendum)
[facility]  Discharge Instructions: Thank you for choosing [facility] to provide your oncology and hematology care.   If you have a lab appointment with the [facility], please go directly to the [facility] and check in at the registration area.   Wear comfortable clothing and clothing appropriate for easy access to any Portacath or PICC line.   We strive to give you quality time with your provider. You may need to reschedule your appointment if you arrive late (15 or more minutes).  Arriving late affects you and other patients whose appointments are after yours.  Also, if you miss three or more appointments without notifying the office, you may be dismissed from the clinic at the provider's discretion.      For prescription refill requests, have your pharmacy contact our office and allow 72 hours for refills to be completed.    Today you received the following chemotherapy and/or immunotherapy agent: Gemcitabine (Gemzar)   To help prevent nausea and vomiting after your treatment, we encourage you to take your nausea medication as directed.  BELOW ARE SYMPTOMS THAT SHOULD BE REPORTED IMMEDIATELY: *FEVER GREATER THAN 100.4 F (38 C) OR HIGHER *CHILLS OR SWEATING *NAUSEA AND VOMITING THAT IS NOT CONTROLLED WITH YOUR NAUSEA MEDICATION *UNUSUAL SHORTNESS OF BREATH *UNUSUAL BRUISING OR BLEEDING *URINARY PROBLEMS (pain or burning when urinating, or frequent urination) *BOWEL PROBLEMS (unusual diarrhea, constipation, pain near the anus) TENDERNESS IN MOUTH AND THROAT WITH OR WITHOUT PRESENCE OF ULCERS (sore throat, sores in mouth, or a toothache) UNUSUAL RASH, SWELLING OR PAIN  UNUSUAL VAGINAL DISCHARGE OR ITCHING   Items with * indicate a potential emergency and should be followed up as soon as possible or go to the Emergency Department if any problems should occur.  Please show the CHEMOTHERAPY ALERT CARD or IMMUNOTHERAPY ALERT CARD at  check-in to the Emergency Department and triage nurse.  Should you have questions after your visit or need to cancel or reschedule your appointment, please contact [facility]  Dept: [phone]  and follow the prompts.  Office hours are 8:00 a.m. to 4:30 p.m. Monday - Friday. Please note that voicemails left after 4:00 p.m. may not be returned until the following business day.  We are closed weekends and major holidays. You have access to a nurse at all times for urgent questions. Please call the main number to the clinic Dept: [phone] and follow the prompts.   For any non-urgent questions, you may also contact your provider using MyChart. We now offer e-Visits for anyone 32 and older to request care online for non-urgent symptoms. For details visit [email or web address].   Also download the MyChart app! Go to the app store, search "MyChart", open the app, select [facility], and log in with your MyChart username and password.  Due to Covid, a mask is required upon entering the hospital/clinic. If you do not have a mask, one will be given to you upon arrival. For doctor visits, patients may have 1 support person aged 27 or older with them. For treatment visits, patients cannot have anyone with them due to current Covid guidelines and our immunocompromised population.   Implanted [facility] Guide An implanted port is a device that is placed under the skin. It is usually placed in the chest. The device can be used to give IV medicine, to take blood, or for dialysis. You may have an implanted port if: You need IV medicine that would  be irritating to the small veins in your hands or arms. You need IV medicines, such as antibiotics, for a long period of time. You need IV nutrition for a long period of time. You need dialysis. When you have a port, your health care provider can choose to use the port instead of veins in your arms for these procedures. You may have  fewer limitations when using a port than you would if you used other types of long-term IVs, and you will likely be able to return to normal activities afteryour incision heals. An implanted port has two main parts: Reservoir. The reservoir is the part where a needle is inserted to give medicines or draw blood. The reservoir is round. After it is placed, it appears as a small, raised area under your skin. Catheter. The catheter is a thin, flexible tube that connects the reservoir to a vein. Medicine that is inserted into the reservoir goes into the catheter and then into the vein. How is my port accessed? To access your port: A numbing cream may be placed on the skin over the port site. Your health care provider will put on a mask and sterile gloves. The skin over your port will be cleaned carefully with a germ-killing soap and allowed to dry. Your health care provider will gently pinch the port and insert a needle into it. Your health care provider will check for a blood return to make sure the port is in the vein and is not clogged. If your port needs to remain accessed to get medicine continuously (constant infusion), your health care provider will place a clear bandage (dressing) over the needle site. The dressing and needle will need to be changed every week, or as told by your health care provider. What is flushing? Flushing helps keep the port from getting clogged. Follow instructions from your health care provider about how and when to flush the port. Ports are usually flushed with saline solution or a medicine called heparin. The need for flushing will depend on how the port is used: If the port is only used from time to time to give medicines or draw blood, the port may need to be flushed: Before and after medicines have been given. Before and after blood has been drawn. As part of routine maintenance. Flushing may be recommended every 4-6 weeks. If a constant infusion is running, the  port may not need to be flushed. Throw away any syringes in a disposal container that is meant for sharp items (sharps container). You can buy a sharps container from a pharmacy, or you can make one by using an empty hard plastic bottle with a cover. How long will my port stay implanted? The port can stay in for as long as your health care provider thinks it is needed. When it is time for the port to come out, a surgery will be done to remove it. The surgery will be similar to the procedure that was done to putthe port in. Follow these instructions at home:  Flush your port as told by your health care provider. If you need an infusion over several days, follow instructions from your health care provider about how to take care of your port site. Make sure you: Wash your hands with soap and water before you change your dressing. If soap and water are not available, use alcohol-based hand sanitizer. Change your dressing as told by your health care provider. Place any used dressings or infusion bags into a  plastic bag. Throw that bag in the trash. Keep the dressing that covers the needle clean and dry. Do not get it wet. Do not use scissors or sharp objects near the tube. Keep the tube clamped, unless it is being used. Check your port site every day for signs of infection. Check for: Redness, swelling, or pain. Fluid or blood. Pus or a bad smell. Protect the skin around the port site. Avoid wearing bra straps that rub or irritate the site. Protect the skin around your port from seat belts. Place a soft pad over your chest if needed. Bathe or shower as told by your health care provider. The site may get wet as long as you are not actively receiving an infusion. Return to your normal activities as told by your health care provider. Ask your health care provider what activities are safe for you. Carry a medical alert card or wear a medical alert bracelet at all times. This will let health care  providers know that you have an implanted port in case of an emergency. Get help right away if: You have redness, swelling, or pain at the port site. You have fluid or blood coming from your port site. You have pus or a bad smell coming from the port site. You have a fever. Summary Implanted ports are usually placed in the chest for long-term IV access. Follow instructions from your health care provider about flushing the port and changing bandages (dressings). Take care of the area around your port by avoiding clothing that puts pressure on the area, and by watching for signs of infection. Protect the skin around your port from seat belts. Place a soft pad over your chest if needed. Get help right away if you have a fever or you have redness, swelling, pain, drainage, or a bad smell at the port site. This information is not intended to replace advice given to you by your health care provider. Make sure you discuss any questions you have with your healthcare provider. Document Revised: 08/03/2019 Document Reviewed: 08/03/2019 Elsevier Patient Education  [place].

## 2020-11-23 NOTE — Progress Notes (Signed)
Pt reported blood noticed in toilet with urination.  He states only small amt.  No c/o of UTI symptoms.  Reported to Dr Julien Nordmann & informed pt to drink more fluids & try some cranberry juice & if he runs fever or has any UTI symptoms to call his PCP.

## 2020-11-24 ENCOUNTER — Inpatient Hospital Stay: Payer: Medicare HMO

## 2020-11-24 NOTE — Progress Notes (Signed)
Pt presented for extravasation check.  Site clean, dry, intact.  No edema/erythema.  No complaints from pt.  No pain/irritation.  Pt states he is not planning to return at 48 hrs/1 week unless needed. Will keep appts just in case.  Pt knows to call for any changes to area.

## 2020-11-25 ENCOUNTER — Inpatient Hospital Stay: Payer: Medicare HMO

## 2020-11-25 NOTE — Progress Notes (Signed)
Late entry:  Almost immediately after beginning Gemzar infusion, patient began c/o burning in his R arm above IV site.  RN paused infusion and IVF to assess site.  IV infiltration noted - approximately 2-3 inches in diameter, color and texture of skin normal.  Ice pack applied to site for 15 minutes, no antidote recommended for this chemotherapy.  IV site removed at 1445, no c/o of tenderness or pain at site.  IV team notified of patient need for new IV site.  MD made aware of infiltration - per Dr. Julien Nordmann, follow RN protocol and return to clinic for RN assessment.  Patient made aware of appointments.  RN continued to monitor site for changes - no changes in skin color or texture noted, no pain or tenderness at site.

## 2020-11-30 ENCOUNTER — Inpatient Hospital Stay: Payer: Medicare HMO

## 2020-11-30 ENCOUNTER — Other Ambulatory Visit: Payer: Self-pay

## 2020-11-30 ENCOUNTER — Other Ambulatory Visit: Payer: Self-pay | Admitting: Medical Oncology

## 2020-11-30 DIAGNOSIS — I878 Other specified disorders of veins: Secondary | ICD-10-CM

## 2020-11-30 NOTE — Progress Notes (Signed)
Patient presented to infusion room for recheck of IV infiltration. No erythema, no edema, no pain. Patient denied other complaints. [name] requested [facility] placement. Abelina Bachelor, RN notified of patient request.

## 2020-12-02 NOTE — Progress Notes (Signed)
[facility] Summary Report / Loop Recorder 

## 2020-12-07 ENCOUNTER — Inpatient Hospital Stay: Payer: Medicare HMO

## 2020-12-07 ENCOUNTER — Telehealth: Payer: Self-pay

## 2020-12-07 ENCOUNTER — Encounter: Payer: Self-pay | Admitting: Internal Medicine

## 2020-12-07 ENCOUNTER — Inpatient Hospital Stay: Payer: Medicare HMO | Attending: Internal Medicine | Admitting: Internal Medicine

## 2020-12-07 ENCOUNTER — Other Ambulatory Visit: Payer: Self-pay

## 2020-12-07 VITALS — BP 144/97 | HR 112 | Temp 98.0°F | Resp 21 | Ht 74.0 in | Wt 166.8 lb

## 2020-12-07 VITALS — HR 100

## 2020-12-07 DIAGNOSIS — D6481 Anemia due to antineoplastic chemotherapy: Secondary | ICD-10-CM

## 2020-12-07 DIAGNOSIS — R059 Cough, unspecified: Secondary | ICD-10-CM | POA: Diagnosis not present

## 2020-12-07 DIAGNOSIS — K5900 Constipation, unspecified: Secondary | ICD-10-CM | POA: Insufficient documentation

## 2020-12-07 DIAGNOSIS — Z9889 Other specified postprocedural states: Secondary | ICD-10-CM | POA: Insufficient documentation

## 2020-12-07 DIAGNOSIS — Z923 Personal history of irradiation: Secondary | ICD-10-CM | POA: Diagnosis not present

## 2020-12-07 DIAGNOSIS — C3411 Malignant neoplasm of upper lobe, right bronchus or lung: Secondary | ICD-10-CM

## 2020-12-07 DIAGNOSIS — R5383 Other fatigue: Secondary | ICD-10-CM | POA: Insufficient documentation

## 2020-12-07 DIAGNOSIS — J90 Pleural effusion, not elsewhere classified: Secondary | ICD-10-CM | POA: Insufficient documentation

## 2020-12-07 DIAGNOSIS — R531 Weakness: Secondary | ICD-10-CM | POA: Insufficient documentation

## 2020-12-07 DIAGNOSIS — R0609 Other forms of dyspnea: Secondary | ICD-10-CM | POA: Diagnosis not present

## 2020-12-07 DIAGNOSIS — Z7901 Long term (current) use of anticoagulants: Secondary | ICD-10-CM | POA: Diagnosis not present

## 2020-12-07 DIAGNOSIS — D72819 Decreased white blood cell count, unspecified: Secondary | ICD-10-CM | POA: Insufficient documentation

## 2020-12-07 DIAGNOSIS — E119 Type 2 diabetes mellitus without complications: Secondary | ICD-10-CM | POA: Diagnosis not present

## 2020-12-07 DIAGNOSIS — Z87442 Personal history of urinary calculi: Secondary | ICD-10-CM | POA: Diagnosis not present

## 2020-12-07 DIAGNOSIS — Z79899 Other long term (current) drug therapy: Secondary | ICD-10-CM | POA: Insufficient documentation

## 2020-12-07 DIAGNOSIS — Z5111 Encounter for antineoplastic chemotherapy: Secondary | ICD-10-CM | POA: Insufficient documentation

## 2020-12-07 DIAGNOSIS — J984 Other disorders of lung: Secondary | ICD-10-CM | POA: Insufficient documentation

## 2020-12-07 DIAGNOSIS — I10 Essential (primary) hypertension: Secondary | ICD-10-CM | POA: Insufficient documentation

## 2020-12-07 DIAGNOSIS — R062 Wheezing: Secondary | ICD-10-CM | POA: Diagnosis not present

## 2020-12-07 DIAGNOSIS — J948 Other specified pleural conditions: Secondary | ICD-10-CM | POA: Insufficient documentation

## 2020-12-07 DIAGNOSIS — Z8673 Personal history of transient ischemic attack (TIA), and cerebral infarction without residual deficits: Secondary | ICD-10-CM | POA: Diagnosis not present

## 2020-12-07 DIAGNOSIS — R112 Nausea with vomiting, unspecified: Secondary | ICD-10-CM | POA: Insufficient documentation

## 2020-12-07 DIAGNOSIS — Z91030 Bee allergy status: Secondary | ICD-10-CM | POA: Insufficient documentation

## 2020-12-07 LAB — CBC WITH DIFFERENTIAL (CANCER CENTER ONLY)
Abs Immature Granulocytes: 0.03 10*3/uL (ref 0.00–0.07)
Basophils Absolute: 0 10*3/uL (ref 0.0–0.1)
Basophils Relative: 0 %
Eosinophils Absolute: 0.1 10*3/uL (ref 0.0–0.5)
Eosinophils Relative: 1 %
HCT: 33.8 % — ABNORMAL LOW (ref 39.0–52.0)
Hemoglobin: 11.3 g/dL — ABNORMAL LOW (ref 13.0–17.0)
Immature Granulocytes: 0 %
Lymphocytes Relative: 4 %
Lymphs Abs: 0.4 10*3/uL — ABNORMAL LOW (ref 0.7–4.0)
MCH: 36.1 pg — ABNORMAL HIGH (ref 26.0–34.0)
MCHC: 33.4 g/dL (ref 30.0–36.0)
MCV: 108 fL — ABNORMAL HIGH (ref 80.0–100.0)
Monocytes Absolute: 1 10*3/uL (ref 0.1–1.0)
Monocytes Relative: 10 %
Neutro Abs: 8.1 10*3/uL — ABNORMAL HIGH (ref 1.7–7.7)
Neutrophils Relative %: 85 %
Platelet Count: 259 10*3/uL (ref 150–400)
RBC: 3.13 MIL/uL — ABNORMAL LOW (ref 4.22–5.81)
RDW: 18.6 % — ABNORMAL HIGH (ref 11.5–15.5)
WBC Count: 9.5 10*3/uL (ref 4.0–10.5)
nRBC: 0 % (ref 0.0–0.2)

## 2020-12-07 LAB — CMP (CANCER CENTER ONLY)
ALT: 22 U/L (ref 0–44)
AST: 18 U/L (ref 15–41)
Albumin: 3.3 g/dL — ABNORMAL LOW (ref 3.5–5.0)
Alkaline Phosphatase: 88 U/L (ref 38–126)
Anion gap: 12 (ref 5–15)
BUN: 23 mg/dL (ref 8–23)
CO2: 21 mmol/L — ABNORMAL LOW (ref 22–32)
Calcium: 9.5 mg/dL (ref 8.9–10.3)
Chloride: 108 mmol/L (ref 98–111)
Creatinine: 1.34 mg/dL — ABNORMAL HIGH (ref 0.61–1.24)
GFR, Estimated: 57 mL/min — ABNORMAL LOW (ref >60)
Glucose, Bld: 120 mg/dL — ABNORMAL HIGH (ref 70–99)
Potassium: 4.3 mmol/L (ref 3.5–5.1)
Sodium: 141 mmol/L (ref 135–145)
Total Bilirubin: 0.9 mg/dL (ref 0.3–1.2)
Total Protein: 6.5 g/dL (ref 6.5–8.1)

## 2020-12-07 LAB — LACTATE DEHYDROGENASE: LDH: 189 U/L (ref 98–192)

## 2020-12-07 LAB — SAMPLE TO BLOOD BANK

## 2020-12-07 MED ORDER — SODIUM CHLORIDE 0.9 % IV SOLN: Freq: Once | INTRAVENOUS | Status: DC

## 2020-12-07 MED ORDER — PROCHLORPERAZINE MALEATE 10 MG PO TABS: 10.0000 mg | ORAL_TABLET | Freq: Once | ORAL | Status: DC

## 2020-12-07 MED ORDER — SODIUM CHLORIDE 0.9 % IV SOLN
Freq: Once | INTRAVENOUS | Status: AC
Start: 2020-12-07 — End: 2020-12-07

## 2020-12-07 MED ORDER — HYDROCODONE BIT-HOMATROP MBR 5-1.5 MG/5ML PO SOLN: 5.0000 mL | Freq: Four times a day (QID) | ORAL | 0 refills | Status: DC | PRN

## 2020-12-07 MED ORDER — SODIUM CHLORIDE 0.9 % IV SOLN
800.0000 mg/m2 | Freq: Once | INTRAVENOUS | Status: AC
  Administered 2020-12-07: 1634 mg via INTRAVENOUS
  Filled 2020-12-07: qty 42.98

## 2020-12-07 MED ORDER — LIDOCAINE-PRILOCAINE 2.5-2.5 % EX CREA: TOPICAL_CREAM | CUTANEOUS | 0 refills | Status: AC

## 2020-12-07 NOTE — Progress Notes (Signed)
Pt given the following information in preparation for port a cath schedule for Monday ,Sept 12.   Appt  at [facility].  Arrive to [facility] admitting at 0800.  Nothing to eat or drink after MN,however , pt may take morning meds with sips of water.  Pt needs a driver and 24 hours supervision .

## 2020-12-07 NOTE — Progress Notes (Signed)
Per Dr Julien Nordmann ,it is okay to treat pt today with Gemzar and heart rate of 112/min.

## 2020-12-07 NOTE — Progress Notes (Signed)
[place] [phone]   [phone]  OFFICE PROGRESS NOTE  Copland, Gay Filler, MD [address] [place] [address]  DIAGNOSIS: Stage IV (T3, N2, M1a) non-small cell lung cancer, adenocarcinoma presented with large right upper lobe lung mass extending to the right suprahilar region in addition to right hilar and mediastinal lymphadenopathy with obstruction of the associated upper lobe bronchus and bilateral subcentimeter pulmonary nodules diagnosed in July 2021.  Biomarker Findings Microsatellite status - MS-Stable Tumor Mutational Burden - 3 Muts/Mb Genomic Findings For a complete list of the genes assayed, please refer to the Appendix. ERBB2 [id number], amplification - equivocal? HGF amplification - equivocal? MTAP loss exons 5-8 MYC amplification - equivocal? CDKN2A/B CDKN2B loss, CDKN2A loss CUL3 Y12f*19 RPTOR amplification TP53 [id number] Disease relevant genes with no reportable alterations: ALK, BRAF, EGFR, KRAS, MET, RET, ROS1  PDL1 Expression: Negative  PRIOR THERAPY:  1) Palliative radiotherapy to the obstructive lung mass under the care of Dr. MoLisbeth RenshawThe past treatment was scheduled for 11/23/19. 2) Systemic chemotherapy with carboplatin for AUC of 5, Alimta 500 mg/M2 and Keytruda 200 mg IV every 3 weeks.  First dose November 04, 2019.  Status post 9 cycles.  Starting from cycle #5 he will be on maintenance treatment with Alimta and Keytruda every 3 weeks. 3) Second line systemic chemotherapy with docetaxel 75 mg/M2 and Cyramza 10 mg/KG every weeks with Neulasta support.  First dose May 18, 2020.  Status post 2 cycles.  Last dose was given June 17, 2020 discontinued secondary to disease progression.  CURRENT THERAPY: Third line systemic chemotherapy with single agent gemcitabine 1000 mg/M2 on days 1 and 8 every 3 weeks.  First dose July 13, 2020.  Status post 7 cycles.  Starting from cycle #7 his dose  of gemcitabine was reduced to 800 Mg/M2 secondary to chemotherapy-induced neutropenia.  INTERVAL HISTORY: Micheal Boeckman89.o. male returns to the clinic today for follow-up visit.  The patient is not feeling well today with increasing fatigue and weakness as well as worsening shortness of breath.  He denied having any chest pain but continues to have cough improved with Hycodan and no hemoptysis.  He denied having any nausea, vomiting, diarrhea but has constipation.  The patient denied having any weight loss or night sweats.  He has no headache or visual changes.  He tolerated the last cycle of his treatment fairly well.  He was supposed to have her Port-A-Cath placed but it was not scheduled.  He is here today for evaluation before starting cycle #8 of his treatment.  MEDICAL HISTORY: Past Medical History:  Diagnosis Date   Chicken pox    DM2 (diabetes mellitus, type 2) ([facility]   History of kidney stones    passed several    Hypertension    Kidney stones    5-6 times   Measles    Mumps    Stroke ([facility]   x 2, last one was 09/18/2019- has some expressive aphasia    ALLERGIES:  is allergic to bee venom.  MEDICATIONS:  Current Outpatient Medications  Medication Sig Dispense Refill   acetaminophen (TYLENOL) 500 MG tablet Take 500 mg by mouth every 6 (six) hours as needed.     albuterol (VENTOLIN HFA) 108 (90 Base) MCG/ACT inhaler Inhale 2 puffs into the lungs every 6 (six) hours as needed for wheezing or shortness of breath. 8 g 2   apixaban (ELIQUIS) 5 MG  TABS tablet Take 1 tablet (5 mg total) by mouth 2 (two) times daily. 60 tablet 11   atorvastatin (LIPITOR) 40 MG tablet TAKE ONE TABLET BY MOUTH ONE TIME DAILY 6PM 90 tablet 3   diltiazem (CARDIZEM CD) 180 MG 24 hr capsule Take 1 capsule (180 mg total) by mouth daily. 30 capsule 6   HYDROcodone bit-homatropine (HYCODAN) 5-1.5 MG/5ML syrup Take 5 mLs by mouth every 6 (six) hours as needed for cough. 120 mL 0   metFORMIN (GLUCOPHAGE)  500 MG tablet TAKE TWO TABLETS BY MOUTH TWICE A DAY WITH MEAL(S) 400 tablet 3   methylPREDNISolone (MEDROL DOSEPAK) 4 MG TBPK tablet Use as instructed. 21 tablet 0   Omega-3 Fatty Acids (FISH OIL) 1200 MG CAPS Take by mouth daily.     prochlorperazine (COMPAZINE) 10 MG tablet Take 1 tablet (10 mg total) by mouth every 6 (six) hours as needed. 30 tablet 2   No current facility-administered medications for this visit.    SURGICAL HISTORY:  Past Surgical History:  Procedure Laterality Date   COLONOSCOPY W/ POLYPECTOMY     VIDEO BRONCHOSCOPY WITH ENDOBRONCHIAL ULTRASOUND N/A 09/30/2019   Procedure: VIDEO BRONCHOSCOPY WITH ENDOBRONCHIAL ULTRASOUND;  Surgeon: Collene Gobble, MD;  Location: [facility];  Service: Thoracic;  Laterality: N/A;   WISDOM TOOTH EXTRACTION  1976-77    REVIEW OF SYSTEMS:  Constitutional: positive for fatigue Eyes: negative Ears, nose, mouth, throat, and face: negative Respiratory: positive for cough and dyspnea on exertion Cardiovascular: negative Gastrointestinal: positive for constipation Genitourinary:negative Integument/breast: negative Hematologic/lymphatic: negative Musculoskeletal:negative Neurological: negative Behavioral/Psych: negative Endocrine: negative Allergic/Immunologic: negative   PHYSICAL EXAMINATION: General appearance: alert, cooperative, fatigued, and no distress Head: Normocephalic, without obvious abnormality, atraumatic, Right eyelid droop Neck: no adenopathy, no JVD, supple, symmetrical, trachea midline, and thyroid not enlarged, symmetric, no tenderness/mass/nodules Lymph nodes: Cervical, supraclavicular, and axillary nodes normal. Resp: diminished breath sounds RLL and dullness to percussion RLL Back: symmetric, no curvature. ROM normal. No CVA tenderness. Cardio: regular rate and rhythm, S1, S2 normal, no murmur, click, rub or gallop GI: soft, non-tender; bowel sounds normal; no masses,  no organomegaly Extremities: extremities normal,  atraumatic, no cyanosis or edema Neurologic: Alert and oriented X 3, normal strength and tone. Normal symmetric reflexes. Normal coordination and gait  ECOG PERFORMANCE STATUS: 1 - Symptomatic but completely ambulatory  Blood pressure (!) 144/97, pulse (!) 112, temperature 98 F (36.7 C), temperature source Tympanic, resp. rate (!) 21, height 6' 2"  (1.88 m), weight 166 lb 12.8 oz (75.7 kg), SpO2 92 %.  LABORATORY DATA: Lab Results  Component Value Date   WBC 9.0 11/23/2020   HGB 10.8 (L) 11/23/2020   HCT 31.5 (L) 11/23/2020   MCV 104.3 (H) 11/23/2020   PLT 217 11/23/2020      Chemistry      Component Value Date/Time   NA 141 11/23/2020 1257   K 4.3 11/23/2020 1257   CL 106 11/23/2020 1257   CO2 25 11/23/2020 1257   BUN 31 (H) 11/23/2020 1257   CREATININE 0.91 11/23/2020 1257   CREATININE 1.13 10/16/2013 1118      Component Value Date/Time   CALCIUM 9.3 11/23/2020 1257   ALKPHOS 69 11/23/2020 1257   AST 26 11/23/2020 1257   ALT 33 11/23/2020 1257   BILITOT 0.8 11/23/2020 1257       RADIOGRAPHIC STUDIES: CT Chest W Contrast  Result Date: 11/15/2020 CLINICAL DATA:  Follow-up metastatic right upper lobe lung adenocarcinoma. EXAM: CT CHEST, ABDOMEN, AND PELVIS WITH  CONTRAST TECHNIQUE: Multidetector CT imaging of the chest, abdomen and pelvis was performed following the standard protocol during bolus administration of intravenous contrast. CONTRAST:  16m OMNIPAQUE IOHEXOL 350 MG/ML SOLN COMPARISON:  09/12/2020 FINDINGS: CT CHEST FINDINGS Cardiovascular: No acute findings. Stable small pericardial effusion. Mediastinum/Lymph Nodes: No masses or pathologically enlarged lymph nodes identified. Lungs/Pleura: Large right pleural effusion and small left pleural effusion are unchanged in size. Compressive atelectasis again seen in the right lower lung. Paramediastinal radiation changes are again seen, however there is a persistent low-attenuation masslike soft tissue density in the  central right upper lobe measuring approximately 4.0 x 2.6 cm, image 25/2), which shows no significant change since previous study. Numerous small pulmonary nodules are again seen in both lungs, which also show no significant change in size or number since previous study. Largest index nodule in the left lower lobe measures 1.5 x 1.3 cm on image 102/4. Musculoskeletal:  No suspicious bone lesions identified. CT ABDOMEN AND PELVIS FINDINGS Hepatobiliary: No masses identified. Gallbladder is unremarkable. No evidence of biliary ductal dilatation. Pancreas:  No mass or inflammatory changes. Spleen:  Within normal limits in size and appearance. Adrenals/Urinary tract: No masses or hydronephrosis. A few tiny less than 5 mm right renal calculi are again seen. A 2.3 cm calculus is again seen within the urinary bladder. Stomach/Bowel: No evidence of obstruction, inflammatory process, or abnormal fluid collections. Diffuse colonic diverticulosis is again seen, however there is no evidence of diverticulitis. Vascular/Lymphatic: No pathologically enlarged lymph nodes identified. No acute vascular findings. Reproductive:  No mass or other significant abnormality identified. Other:  None. Musculoskeletal:  No suspicious bone lesions identified. IMPRESSION: Stable bilateral pulmonary metastases. Stable soft tissue density in central right upper lobe. Stable large right pleural effusion and small left pleural effusion. Stable small pericardial effusion. No new or progressive disease within the thorax. No evidence of abdominal or pelvic metastatic disease. Colonic diverticulosis, without radiographic evidence of diverticulitis. Nonobstructing right nephrolithiasis and large urinary bladder calculus. Electronically Signed   By: JMarlaine HindM.D.   On: 11/15/2020 10:14   CT Abdomen Pelvis W Contrast  Result Date: 11/15/2020 CLINICAL DATA:  Follow-up metastatic right upper lobe lung adenocarcinoma. EXAM: CT CHEST, ABDOMEN, AND  PELVIS WITH CONTRAST TECHNIQUE: Multidetector CT imaging of the chest, abdomen and pelvis was performed following the standard protocol during bolus administration of intravenous contrast. CONTRAST:  875mOMNIPAQUE IOHEXOL 350 MG/ML SOLN COMPARISON:  09/12/2020 FINDINGS: CT CHEST FINDINGS Cardiovascular: No acute findings. Stable small pericardial effusion. Mediastinum/Lymph Nodes: No masses or pathologically enlarged lymph nodes identified. Lungs/Pleura: Large right pleural effusion and small left pleural effusion are unchanged in size. Compressive atelectasis again seen in the right lower lung. Paramediastinal radiation changes are again seen, however there is a persistent low-attenuation masslike soft tissue density in the central right upper lobe measuring approximately 4.0 x 2.6 cm, image 25/2), which shows no significant change since previous study. Numerous small pulmonary nodules are again seen in both lungs, which also show no significant change in size or number since previous study. Largest index nodule in the left lower lobe measures 1.5 x 1.3 cm on image 102/4. Musculoskeletal:  No suspicious bone lesions identified. CT ABDOMEN AND PELVIS FINDINGS Hepatobiliary: No masses identified. Gallbladder is unremarkable. No evidence of biliary ductal dilatation. Pancreas:  No mass or inflammatory changes. Spleen:  Within normal limits in size and appearance. Adrenals/Urinary tract: No masses or hydronephrosis. A few tiny less than 5 mm right renal calculi are again seen.  A 2.3 cm calculus is again seen within the urinary bladder. Stomach/Bowel: No evidence of obstruction, inflammatory process, or abnormal fluid collections. Diffuse colonic diverticulosis is again seen, however there is no evidence of diverticulitis. Vascular/Lymphatic: No pathologically enlarged lymph nodes identified. No acute vascular findings. Reproductive:  No mass or other significant abnormality identified. Other:  None. Musculoskeletal:   No suspicious bone lesions identified. IMPRESSION: Stable bilateral pulmonary metastases. Stable soft tissue density in central right upper lobe. Stable large right pleural effusion and small left pleural effusion. Stable small pericardial effusion. No new or progressive disease within the thorax. No evidence of abdominal or pelvic metastatic disease. Colonic diverticulosis, without radiographic evidence of diverticulitis. Nonobstructing right nephrolithiasis and large urinary bladder calculus. Electronically Signed   By: Marlaine Hind M.D.   On: 11/15/2020 10:14   CUP PACEART REMOTE DEVICE CHECK  Result Date: 11/16/2020 ILR summary report received. Battery status OK. Normal device function. No new symptom, tachy, brady, or pause episodes. 3 new AF episodes.  Burden 100%, CVR.  Pt. prescribed Eliquis, Diltiazem.   Monthly summary reports and ROV/PRN LR    ASSESSMENT AND PLAN: This is a very pleasant 70 years old white male recently diagnosed with a stage IV (T3, N2, M1 a) non-small cell lung cancer, adenocarcinoma presented with large right upper lobe lung mass with extension to the right suprahilar region as well as right hilar and mediastinal lymphadenopathy with obstruction of the right upper lobe and bilateral pulmonary nodules diagnosed in July 2021. The molecular study shows no actionable mutations and PD-L1 expression is negative. He had a short course of palliative radiotherapy to the obstructive lung mass under the care of Dr. Lisbeth Renshaw. The patient underwent systemic chemotherapy with carboplatin for AUC of 5, Alimta 500 mg/M2 and Keytruda 200 mg IV every 3 weeks status post 9 cycles.  Starting from cycle #5 he will be on maintenance treatment with Alimta and Keytruda every 3 weeks.  He has been tolerating this treatment well with no concerning complaints except for fatigue and cough.  This treatment was discontinued secondary to disease progression. The patient underwent palliative second line  systemic chemotherapy with docetaxel 75 mg/M2 and Cyramza 10 mg/KG every 3 weeks with Neulasta support.  Started May 18, 2020.  Status post 2 cycles. Unfortunately his scan showed evidence for disease progression and multiple bilateral pulmonary nodules but stable disease in the dominant mass and right pleural effusion. He is currently undergoing palliative systemic chemotherapy with single agent gemcitabine 1000 MG/M2 IV on days 1 and 8 every 3 weeks.  Status post 7 cycles.  The patient has been tolerating this treatment well except for fatigue and the chemotherapy-induced neutropenia after cycle #6. Starting from cycle #7 his dose of gemcitabine was reduced to 800 Mg/M2 on days 1 and 8 every 3 weeks. The patient tolerated the last cycle of his treatment well except for the fatigue. I recommended for him to proceed with cycle #8 today as planned. For the recurrent and enlarged right pleural effusion, I recommended for the patient to proceed with the ultrasound-guided right thoracentesis for drainage of the pleural effusion and he is in agreement at this time. For the IV access, the patient was referred to IR for Port-A-Cath placement.  I will send Emla cream to his pharmacy. For the constipation he will continue with the laxative for now and if no improvement he will try milk of magnesia. For the persistent cough, I will give him refill of Hycodan. The patient will  come back for follow-up visit in 3 weeks for evaluation before starting cycle #9. He was advised to call immediately if he has any concerning symptoms in the interval. The patient voices understanding of current disease status and treatment options and is in agreement with the current care plan.  All questions were answered. The patient knows to call the clinic with any problems, questions or concerns. We can certainly see the patient much sooner if necessary.  Disclaimer: This note was dictated with voice recognition software.  Similar sounding words can inadvertently be transcribed and may not be corrected upon review.

## 2020-12-07 NOTE — Telephone Encounter (Signed)
Notified Patient of Prior Authorization approval for Lidocaine-Prilocaine 2.5% Cream. Medication is authorized through 03/07/2021.

## 2020-12-09 ENCOUNTER — Other Ambulatory Visit: Payer: Self-pay

## 2020-12-09 ENCOUNTER — Ambulatory Visit (HOSPITAL_COMMUNITY)
Admission: RE | Admit: 2020-12-09 | Discharge: 2020-12-09 | Disposition: A | Discharge status: Home / Self Care | Payer: Medicare HMO | Source: Ambulatory Visit | Attending: Radiology | Admitting: Radiology

## 2020-12-09 ENCOUNTER — Other Ambulatory Visit (HOSPITAL_COMMUNITY): Payer: Self-pay | Admitting: Physician Assistant

## 2020-12-09 ENCOUNTER — Ambulatory Visit (HOSPITAL_COMMUNITY)
Admission: RE | Admit: 2020-12-09 | Discharge: 2020-12-09 | Disposition: A | Discharge status: Home / Self Care | Payer: Medicare HMO | Source: Ambulatory Visit | Attending: Internal Medicine | Admitting: Internal Medicine

## 2020-12-09 ENCOUNTER — Other Ambulatory Visit (HOSPITAL_COMMUNITY): Payer: Self-pay | Admitting: Radiology

## 2020-12-09 DIAGNOSIS — C3411 Malignant neoplasm of upper lobe, right bronchus or lung: Secondary | ICD-10-CM | POA: Insufficient documentation

## 2020-12-09 DIAGNOSIS — C3490 Malignant neoplasm of unspecified part of unspecified bronchus or lung: Secondary | ICD-10-CM | POA: Diagnosis not present

## 2020-12-09 DIAGNOSIS — J90 Pleural effusion, not elsewhere classified: Secondary | ICD-10-CM | POA: Diagnosis not present

## 2020-12-09 DIAGNOSIS — Z9889 Other specified postprocedural states: Secondary | ICD-10-CM

## 2020-12-09 MED ORDER — LIDOCAINE HCL 1 % IJ SOLN
INTRAMUSCULAR | Status: AC
  Filled 2020-12-09: qty 20

## 2020-12-09 NOTE — Procedures (Signed)
PROCEDURE SUMMARY:  Successful US guided right thoracentesis. Yielded 2.3 L of clear yellow fluid. Pt tolerated procedure well. No immediate complications.  Specimen was not sent for labs. CXR ordered.  EBL < 5 mL  Ascencion Dike PA-C 12/09/2020 4:09 PM

## 2020-12-12 ENCOUNTER — Other Ambulatory Visit: Payer: Self-pay | Admitting: Internal Medicine

## 2020-12-12 ENCOUNTER — Other Ambulatory Visit: Payer: Self-pay

## 2020-12-12 ENCOUNTER — Ambulatory Visit (HOSPITAL_COMMUNITY)
Admission: RE | Admit: 2020-12-12 | Discharge: 2020-12-12 | Disposition: A | Discharge status: Home / Self Care | Payer: Medicare HMO | Source: Ambulatory Visit | Attending: Internal Medicine | Admitting: Internal Medicine

## 2020-12-12 DIAGNOSIS — Z7901 Long term (current) use of anticoagulants: Secondary | ICD-10-CM | POA: Insufficient documentation

## 2020-12-12 DIAGNOSIS — C7801 Secondary malignant neoplasm of right lung: Secondary | ICD-10-CM | POA: Diagnosis not present

## 2020-12-12 DIAGNOSIS — I878 Other specified disorders of veins: Secondary | ICD-10-CM

## 2020-12-12 DIAGNOSIS — C3411 Malignant neoplasm of upper lobe, right bronchus or lung: Secondary | ICD-10-CM | POA: Diagnosis not present

## 2020-12-12 DIAGNOSIS — Z79899 Other long term (current) drug therapy: Secondary | ICD-10-CM | POA: Insufficient documentation

## 2020-12-12 DIAGNOSIS — Z91030 Bee allergy status: Secondary | ICD-10-CM | POA: Insufficient documentation

## 2020-12-12 DIAGNOSIS — Z7984 Long term (current) use of oral hypoglycemic drugs: Secondary | ICD-10-CM | POA: Insufficient documentation

## 2020-12-12 DIAGNOSIS — C3490 Malignant neoplasm of unspecified part of unspecified bronchus or lung: Secondary | ICD-10-CM | POA: Diagnosis not present

## 2020-12-12 DIAGNOSIS — Z452 Encounter for adjustment and management of vascular access device: Secondary | ICD-10-CM | POA: Diagnosis not present

## 2020-12-12 HISTORY — PX: IR IMAGING GUIDED PORT INSERTION: IMG5740

## 2020-12-12 LAB — GLUCOSE, CAPILLARY: Glucose-Capillary: 103 mg/dL — ABNORMAL HIGH (ref 70–99)

## 2020-12-12 MED ORDER — MIDAZOLAM HCL 2 MG/2ML IJ SOLN
INTRAMUSCULAR | Status: DC | PRN
  Administered 2020-12-12: .5 mg via INTRAVENOUS

## 2020-12-12 MED ORDER — MIDAZOLAM HCL 2 MG/2ML IJ SOLN
INTRAMUSCULAR | Status: DC | PRN
  Administered 2020-12-12: 1 mg via INTRAVENOUS

## 2020-12-12 MED ORDER — HEPARIN SOD (PORK) LOCK FLUSH 100 UNIT/ML IV SOLN
INTRAVENOUS | Status: DC | PRN
  Administered 2020-12-12: 500 [IU]

## 2020-12-12 MED ORDER — FENTANYL CITRATE (PF) 100 MCG/2ML IJ SOLN
INTRAMUSCULAR | Status: AC
  Filled 2020-12-12: qty 4

## 2020-12-12 MED ORDER — SODIUM CHLORIDE 0.9 % IV SOLN: INTRAVENOUS | Status: DC

## 2020-12-12 MED ORDER — FENTANYL CITRATE (PF) 100 MCG/2ML IJ SOLN
INTRAMUSCULAR | Status: DC | PRN
  Administered 2020-12-12: 25 ug via INTRAVENOUS

## 2020-12-12 MED ORDER — MIDAZOLAM HCL 2 MG/2ML IJ SOLN
INTRAMUSCULAR | Status: DC | PRN
Start: 2020-12-12 — End: 2020-12-13
  Administered 2020-12-12: .5 mg via INTRAVENOUS

## 2020-12-12 MED ORDER — LIDOCAINE HCL 1 % IJ SOLN
INTRAMUSCULAR | Status: AC
  Filled 2020-12-12: qty 20

## 2020-12-12 MED ORDER — LIDOCAINE HCL (PF) 1 % IJ SOLN
INTRAMUSCULAR | Status: DC | PRN
  Administered 2020-12-12: 15 mL

## 2020-12-12 MED ORDER — MIDAZOLAM HCL 2 MG/2ML IJ SOLN
INTRAMUSCULAR | Status: AC
  Filled 2020-12-12: qty 4

## 2020-12-12 MED ORDER — FENTANYL CITRATE (PF) 100 MCG/2ML IJ SOLN
INTRAMUSCULAR | Status: DC | PRN
  Administered 2020-12-12: 50 ug via INTRAVENOUS

## 2020-12-12 MED ORDER — HEPARIN SOD (PORK) LOCK FLUSH 100 UNIT/ML IV SOLN
INTRAVENOUS | Status: AC
  Filled 2020-12-12: qty 5

## 2020-12-12 NOTE — Procedures (Signed)
Interventional Radiology Procedure Note  Procedure: Single Lumen Power Port Placement    Access:  Right IJ vein.  Findings: Catheter tip positioned at SVC/RA junction. Port is ready for immediate use.   Complications: None  EBL: < 10 mL  Recommendations:  - Ok to shower in 24 hours - Do not submerge for 7 days - Routine line care   Hayley Horn T. Adal Sereno, M.D Pager:  [phone]   

## 2020-12-12 NOTE — H&P (Signed)
Referring Physician(s): Mohamed,Mohamed  Supervising Physician: Aletta Edouard  Patient Status:  [facility]  Chief Complaint:  "I'm here for a port a cath"  Subjective: Pt known to IR service from right thoracenteses on 09/19/20 and 12/09/20. He has a hx of stage IV (T3, N2, M1a) non-small cell lung cancer, adenocarcinoma and presented with large right upper lobe lung mass extending to the right suprahilar region in addition to right hilar and mediastinal lymphadenopathy with obstruction of the associated upper lobe bronchus and bilateral subcentimeter pulmonary nodules ,diagnosed in July 2021. He has undergone radiation therapy and is receiving chemotherapy. He has poor venous access and presents today for port a cath placement to assist with additional treatment. He denies fever, HA, CP, abd/back pain,N/V or bleeding. He does have dyspnea and cough. Additional med hx as below.  Past Medical History:  Diagnosis Date   Chicken pox    DM2 (diabetes mellitus, type 2) ([place])    History of kidney stones    passed several    Hypertension    Kidney stones    5-6 times   Measles    Mumps    Stroke ([place])    x 2, last one was 09/18/2019- has some expressive aphasia   Past Surgical History:  Procedure Laterality Date   COLONOSCOPY W/ POLYPECTOMY     VIDEO BRONCHOSCOPY WITH ENDOBRONCHIAL ULTRASOUND N/A 09/30/2019   Procedure: VIDEO BRONCHOSCOPY WITH ENDOBRONCHIAL ULTRASOUND;  Surgeon: Collene Gobble, MD;  Location: [place];  Service: Thoracic;  Laterality: N/A;   WISDOM TOOTH EXTRACTION  1976-77      Allergies: Bee venom  Medications: Prior to Admission medications   Medication Sig Start Date End Date Taking? Authorizing Provider  apixaban (ELIQUIS) 5 MG TABS tablet Take 1 tablet (5 mg total) by mouth 2 (two) times daily. 04/07/20  Yes Camnitz, Will Hassell Done, MD  atorvastatin (LIPITOR) 40 MG tablet TAKE ONE TABLET BY MOUTH ONE TIME DAILY 6PM 10/18/20  Yes Copland, Gay Filler, MD  diltiazem  (CARDIZEM CD) 180 MG 24 hr capsule Take 1 capsule (180 mg total) by mouth daily. 08/19/20  Yes Camnitz, Will Hassell Done, MD  diphenhydrAMINE HCl, Sleep, (ZZZQUIL) 25 MG CAPS Take 25 mg by mouth at bedtime.   Yes [provider]  HYDROcodone bit-homatropine (HYCODAN) 5-1.5 MG/5ML syrup Take 5 mLs by mouth every 6 (six) hours as needed for cough. 12/07/20  Yes Curt Bears, MD  metFORMIN (GLUCOPHAGE) 500 MG tablet TAKE TWO TABLETS BY MOUTH TWICE A DAY WITH MEAL(S) 11/06/20  Yes Copland, Gay Filler, MD  Omega-3 Fatty Acids (FISH OIL) 1200 MG CAPS Take 1,200 mg by mouth daily.   Yes [provider]  prochlorperazine (COMPAZINE) 10 MG tablet Take 1 tablet (10 mg total) by mouth every 6 (six) hours as needed. 10/26/20  Yes Heilingoetter, Cassandra L, PA-C  albuterol (VENTOLIN HFA) 108 (90 Base) MCG/ACT inhaler Inhale 2 puffs into the lungs every 6 (six) hours as needed for wheezing or shortness of breath. 07/20/20   Tanner, Lyndon Code., PA-C  lidocaine-prilocaine (EMLA) cream Apply to the Port-A-Cath site 30-60-minute before treatment 12/07/20   Curt Bears, MD  methylPREDNISolone (MEDROL DOSEPAK) 4 MG TBPK tablet Use as instructed. Patient not taking: No sig reported 11/15/20   Curt Bears, MD     Vital Signs: BP 114/74   Pulse 78   Temp 98.2 F (36.8 C) (Oral)   Resp 18   Ht 6\' 2"  (1.88 m)   Wt 165 lb (74.8 kg)  SpO2 99%   BMI 21.18 kg/m   Physical Exam awake/alert; chest- dim BS on rt , left clear; left chest wall cardiac monitor; heart- nl rate, ectopy noted; abd- soft,+BS,NT; bilat pretibial  edema noted  Imaging: DG Chest 1 View  Result Date: 12/09/2020 CLINICAL DATA:  Status post right thoracentesis EXAM: CHEST  1 VIEW COMPARISON:  Thoracentesis earlier same day FINDINGS: The cardiomediastinal silhouette is within normal limits. Residual moderate to large right pleural effusion. No pneumothorax. Implantable cardiac monitor. IMPRESSION: Status post right thoracentesis  without pneumothorax. There remains a residual moderate to large right pleural effusion. Electronically Signed   By: Albin Felling M.D.   On: 12/09/2020 16:26   US Thoracentesis Asp Pleural space w/IMG guide  Result Date: 12/09/2020 INDICATION: Patient with history of non-small cell lung cancer with recurrent right pleural effusion. Request for therapeutic thoracentesis. EXAM: ULTRASOUND GUIDED RIGHT THORACENTESIS MEDICATIONS: 1% plain lidocaine, 5 mL COMPLICATIONS: None immediate. PROCEDURE: An ultrasound guided thoracentesis was thoroughly discussed with the patient and questions answered. The benefits, risks, alternatives and complications were also discussed. The patient understands and wishes to proceed with the procedure. Written consent was obtained. Ultrasound was performed to localize and mark an adequate pocket of fluid in the right chest. The area was then prepped and draped in the normal sterile fashion. 1% Lidocaine was used for local anesthesia. Under ultrasound guidance a 6 Fr Safe-T-Centesis catheter was introduced. Thoracentesis was performed. The catheter was removed and a dressing applied. FINDINGS: A total of approximately 2.3 L of clear, dark yellow fluid was removed. IMPRESSION: Successful ultrasound guided right thoracentesis yielding 2.3 L of pleural fluid. Read by: Ascencion Dike PA-C Electronically Signed   By: Aletta Edouard M.D.   On: 12/09/2020 16:29    Labs:  CBC: Recent Labs    11/02/20 0818 11/15/20 1254 11/23/20 1257 12/07/20 1146  WBC 1.2* 9.7 9.0 9.5  HGB 10.2* 10.8* 10.8* 11.3*  HCT 30.7* 32.5* 31.5* 33.8*  PLT 370 355 217 259    COAGS: No results for input(s): INR, APTT in the last 8760 hours.  BMP: Recent Labs    12/16/19 0856 12/23/19 0851 12/30/19 0844 01/06/20 0838 11/02/20 0818 11/15/20 1254 11/23/20 1257 12/07/20 1146  NA 142 137 141   < > 141 140 141 141  K 4.3 4.9 4.4   < > 4.1 4.3 4.3 4.3  CL 111 107 109   < > 106 107 106 108  CO2  21* 22 26   < > 24 25 25  21*  GLUCOSE 202* 202* 199*   < > 122* 101* 93 120*  BUN 22 28* 17   < > 19 17 31* 23  CALCIUM 9.2 9.1 9.3   < > 9.7 9.3 9.3 9.5  CREATININE 1.22 1.21 1.28*   < > 1.15 1.05 0.91 1.34*  GFRNONAA >60 >60 57*   < > >60 >60 >60 57*  GFRAA >60 >60 >60  --   --   --   --   --    < > = values in this interval not displayed.    LIVER FUNCTION TESTS: Recent Labs    11/02/20 0818 11/15/20 1254 11/23/20 1257 12/07/20 1146  BILITOT 1.0 0.7 0.8 0.9  AST 34 16 26 18   ALT 44 14 33 22  ALKPHOS 93 83 69 88  PROT 6.9 6.7 6.6 6.5  ALBUMIN 3.4* 3.3* 3.4* 3.3*    Assessment and Plan: Pt known to IR service from right thoracenteses on  09/19/20 and 12/09/20. He has a hx of stage IV (T3, N2, M1a) non-small cell lung cancer, adenocarcinoma and presented with large right upper lobe lung mass extending to the right suprahilar region in addition to right hilar and mediastinal lymphadenopathy with obstruction of the associated upper lobe bronchus and bilateral subcentimeter pulmonary nodules ,diagnosed in July 2021. He has undergone radiation therapy and is receiving chemotherapy. He has poor venous access and presents today for port a cath placement to assist with additional treatment.Risks and benefits of image guided port-a-catheter placement was discussed with the patient including, but not limited to bleeding, infection, pneumothorax, or fibrin sheath development and need for additional procedures.  All of the patient's questions were answered, patient is agreeable to proceed. Consent signed and in chart.    Electronically Signed: D. Rowe Robert, PA-C 12/12/2020, 9:47 AM   I spent a total of 25 minutes at the the patient's bedside AND on the patient's [facility] floor or unit, greater than 50% of which was counseling/coordinating care for port a cath placement

## 2020-12-14 ENCOUNTER — Other Ambulatory Visit: Payer: Self-pay

## 2020-12-14 ENCOUNTER — Inpatient Hospital Stay: Payer: Medicare HMO

## 2020-12-14 DIAGNOSIS — K5900 Constipation, unspecified: Secondary | ICD-10-CM | POA: Diagnosis not present

## 2020-12-14 DIAGNOSIS — C3411 Malignant neoplasm of upper lobe, right bronchus or lung: Secondary | ICD-10-CM

## 2020-12-14 DIAGNOSIS — R5383 Other fatigue: Secondary | ICD-10-CM | POA: Diagnosis not present

## 2020-12-14 DIAGNOSIS — E119 Type 2 diabetes mellitus without complications: Secondary | ICD-10-CM | POA: Diagnosis not present

## 2020-12-14 DIAGNOSIS — I10 Essential (primary) hypertension: Secondary | ICD-10-CM | POA: Diagnosis not present

## 2020-12-14 DIAGNOSIS — Z5111 Encounter for antineoplastic chemotherapy: Secondary | ICD-10-CM | POA: Diagnosis not present

## 2020-12-14 DIAGNOSIS — R0609 Other forms of dyspnea: Secondary | ICD-10-CM | POA: Diagnosis not present

## 2020-12-14 DIAGNOSIS — R059 Cough, unspecified: Secondary | ICD-10-CM | POA: Diagnosis not present

## 2020-12-14 DIAGNOSIS — R531 Weakness: Secondary | ICD-10-CM | POA: Diagnosis not present

## 2020-12-14 DIAGNOSIS — R112 Nausea with vomiting, unspecified: Secondary | ICD-10-CM | POA: Diagnosis not present

## 2020-12-14 LAB — CBC WITH DIFFERENTIAL (CANCER CENTER ONLY)
Abs Immature Granulocytes: 0.15 10*3/uL — ABNORMAL HIGH (ref 0.00–0.07)
Basophils Absolute: 0.1 10*3/uL (ref 0.0–0.1)
Basophils Relative: 4 %
Eosinophils Absolute: 0 10*3/uL (ref 0.0–0.5)
Eosinophils Relative: 1 %
HCT: 32.3 % — ABNORMAL LOW (ref 39.0–52.0)
Hemoglobin: 10.8 g/dL — ABNORMAL LOW (ref 13.0–17.0)
Immature Granulocytes: 10 %
Lymphocytes Relative: 25 %
Lymphs Abs: 0.4 10*3/uL — ABNORMAL LOW (ref 0.7–4.0)
MCH: 35.9 pg — ABNORMAL HIGH (ref 26.0–34.0)
MCHC: 33.4 g/dL (ref 30.0–36.0)
MCV: 107.3 fL — ABNORMAL HIGH (ref 80.0–100.0)
Monocytes Absolute: 0.3 10*3/uL (ref 0.1–1.0)
Monocytes Relative: 19 %
Neutro Abs: 0.6 10*3/uL — ABNORMAL LOW (ref 1.7–7.7)
Neutrophils Relative %: 41 %
Platelet Count: 241 10*3/uL (ref 150–400)
RBC: 3.01 MIL/uL — ABNORMAL LOW (ref 4.22–5.81)
RDW: 17.2 % — ABNORMAL HIGH (ref 11.5–15.5)
WBC Count: 1.5 10*3/uL — ABNORMAL LOW (ref 4.0–10.5)
nRBC: 0 % (ref 0.0–0.2)

## 2020-12-14 LAB — CMP (CANCER CENTER ONLY)
ALT: 59 U/L — ABNORMAL HIGH (ref 0–44)
AST: 49 U/L — ABNORMAL HIGH (ref 15–41)
Albumin: 3 g/dL — ABNORMAL LOW (ref 3.5–5.0)
Alkaline Phosphatase: 88 U/L (ref 38–126)
Anion gap: 10 (ref 5–15)
BUN: 18 mg/dL (ref 8–23)
CO2: 22 mmol/L (ref 22–32)
Calcium: 9.3 mg/dL (ref 8.9–10.3)
Chloride: 109 mmol/L (ref 98–111)
Creatinine: 1.04 mg/dL (ref 0.61–1.24)
GFR, Estimated: >60 mL/min (ref >60)
Glucose, Bld: 128 mg/dL — ABNORMAL HIGH (ref 70–99)
Potassium: 4.3 mmol/L (ref 3.5–5.1)
Sodium: 141 mmol/L (ref 135–145)
Total Bilirubin: 0.6 mg/dL (ref 0.3–1.2)
Total Protein: 6.3 g/dL — ABNORMAL LOW (ref 6.5–8.1)

## 2020-12-14 NOTE — Progress Notes (Signed)
Due to [place] 0.6 ,not treating, per Cassie PA. Patient is aware of risk of infection and precautions associated with it.

## 2020-12-19 ENCOUNTER — Ambulatory Visit (INDEPENDENT_AMBULATORY_CARE_PROVIDER_SITE_OTHER): Payer: Medicare HMO

## 2020-12-19 DIAGNOSIS — I639 Cerebral infarction, unspecified: Secondary | ICD-10-CM | POA: Diagnosis not present

## 2020-12-20 LAB — CUP PACEART REMOTE DEVICE CHECK
Date Time Interrogation Session: 20220916230511
Implantable Pulse Generator Implant Date: 20210907

## 2020-12-23 NOTE — Progress Notes (Signed)
[facility] Summary Report / Loop Recorder 

## 2020-12-23 NOTE — Progress Notes (Signed)
[facility] OFFICE PROGRESS NOTE  Copland, Gay Filler, MD [address] [place] [address]  DIAGNOSIS: Stage IV (T3, N2, M1a) non-small cell lung cancer, adenocarcinoma presented with large right upper lobe lung mass extending to the right suprahilar region in addition to right hilar and mediastinal lymphadenopathy with obstruction of the associated upper lobe bronchus and bilateral subcentimeter pulmonary nodules diagnosed in July 2021.   Biomarker Findings Microsatellite status - MS-Stable Tumor Mutational Burden - 3 Muts/Mb Genomic Findings For a complete list of the genes assayed, please refer to the Appendix. ERBB2 [id number], amplification - equivocal? HGF amplification - equivocal? MTAP loss exons 5-8 MYC amplification - equivocal? CDKN2A/B CDKN2B loss, CDKN2A loss CUL3 Y51f*19 RPTOR amplification TP53 [id number] Disease relevant genes with no reportable alterations: ALK, BRAF, EGFR, KRAS, MET, RET, ROS1   PDL1 Expression: Negative  PRIOR THERAPY:  1) Palliative radiotherapy to the obstructive lung mass under the care of Dr. MoLisbeth RenshawThe last treatment was on 11/23/19 2) Systemic chemotherapy with carboplatin for AUC of 5, Alimta 500 mg/M2 and Keytruda 200 mg IV every 3 weeks.  First dose November 04, 2019.  Status post 9 cycles.  Starting from cycle #5 he was on maintenance treatment with Alimta and Keytruda every 3 weeks. 3) Second line systemic chemotherapy with docetaxel 75 mg/M2 and Cyramza 10 mg/KG every weeks with Neulasta support.  First dose May 18, 2020.  Status post 2 cycles.  Last dose was given June 17, 2020 discontinued secondary to disease progression.  CURRENT THERAPY: Third line systemic chemotherapy with single agent gemcitabine 1000 mg/M2 on days 1 and 8 every 3 weeks.  First dose July 13, 2020. Status post 5 cycles. His dose was reduced to 800 mg/m2.   INTERVAL HISTORY: Micheal Torelli89.o. male returns to the  clinic today for a follow up visit. The patient is feeling fair today except he has a history of recurrent pleural effusions and he is having progressive symptoms suggestive of recurrent effusion. His last thoracentesis was on 12/09/20 which yielded 2.3 L of fluid. He notes progressive dry cough which is worse with laying down. It is hard for him to sleep on his right side because it "feels like something is in there". Hycodan does help his cough and he is requesting a refill. He notes some occasional wheezing but has not used his albuterol in 3 months or so. He denies chest pain or hemoptysis. He reports dyspnea on exertion with walking from one part of the clinic to the other.   Day 8 of cycle #8 was cancelled due to neutropenia despite being on dose reduced gemzar. He continues to have fatigue and weakness which is similar compared to prior per patient report. He denies fevers or night sweats. His weight is stable but he has lost weight through of course of the last few months. He is always cold.  He has intermittent nausea/vomiting which is unpredictable in pattern. His anti-emetics work but he is not always able to stay on top of when he is going to experience nausea/vomiting. He denies significant diarrhea or constipation except for some constipation following his infusions.  He has over-the-counter constipation medication if needed. Denies headaches or visual changes. His ptosis of the right eye has good days and bad days. He was supposed to have a repeat brain MRI in the summer of 2022 to follow-upon some abnormalities seen on neuroimaging in spring 2022, but he refused repeat imaging. He is here  for evaluation and repeat blood work before starting cycle #9.     MEDICAL HISTORY: Past Medical History:  Diagnosis Date   Chicken pox    DM2 (diabetes mellitus, type 2) ([place])    History of kidney stones    passed several    Hypertension    Kidney stones    5-6 times   Measles    Mumps    Stroke  ([place])    x 2, last one was 09/18/2019- has some expressive aphasia    ALLERGIES:  is allergic to bee venom.  MEDICATIONS:  Current Outpatient Medications  Medication Sig Dispense Refill   albuterol (VENTOLIN HFA) 108 (90 Base) MCG/ACT inhaler Inhale 2 puffs into the lungs every 6 (six) hours as needed for wheezing or shortness of breath. 8 g 2   apixaban (ELIQUIS) 5 MG TABS tablet Take 1 tablet (5 mg total) by mouth 2 (two) times daily. 60 tablet 11   atorvastatin (LIPITOR) 40 MG tablet TAKE ONE TABLET BY MOUTH ONE TIME DAILY 6PM 90 tablet 3   diltiazem (CARDIZEM CD) 180 MG 24 hr capsule Take 1 capsule (180 mg total) by mouth daily. 30 capsule 6   diphenhydrAMINE HCl, Sleep, (ZZZQUIL) 25 MG CAPS Take 25 mg by mouth at bedtime.     HYDROcodone bit-homatropine (HYCODAN) 5-1.5 MG/5ML syrup Take 5 mLs by mouth every 6 (six) hours as needed for cough. 473 mL 0   lidocaine-prilocaine (EMLA) cream Apply to the Port-A-Cath site 30-60-minute before treatment 30 g 0   metFORMIN (GLUCOPHAGE) 500 MG tablet TAKE TWO TABLETS BY MOUTH TWICE A DAY WITH MEAL(S) 400 tablet 3   methylPREDNISolone (MEDROL DOSEPAK) 4 MG TBPK tablet Use as instructed. (Patient not taking: No sig reported) 21 tablet 0   Omega-3 Fatty Acids (FISH OIL) 1200 MG CAPS Take 1,200 mg by mouth daily.     prochlorperazine (COMPAZINE) 10 MG tablet Take 1 tablet (10 mg total) by mouth every 6 (six) hours as needed. 30 tablet 2   No current facility-administered medications for this visit.    SURGICAL HISTORY:  Past Surgical History:  Procedure Laterality Date   COLONOSCOPY W/ POLYPECTOMY     IR IMAGING GUIDED PORT INSERTION  12/12/2020   VIDEO BRONCHOSCOPY WITH ENDOBRONCHIAL ULTRASOUND N/A 09/30/2019   Procedure: VIDEO BRONCHOSCOPY WITH ENDOBRONCHIAL ULTRASOUND;  Surgeon: Collene Gobble, MD;  Location: [facility];  Service: Thoracic;  Laterality: N/A;   WISDOM TOOTH EXTRACTION  1976-77    REVIEW OF SYSTEMS:   Review of Systems   Constitutional: Positive for fatigue, decreased appetite, cold intolerance. Negative for chills, fever and unexpected weight change.  HENT: Negative for mouth sores, nosebleeds, sore throat and trouble swallowing.   Eyes: Positive for ptosis of right eye. Negative for eye problems and icterus.  Respiratory: Positive for dyspnea on exertion and cough.  Negative for hemoptysis and wheezing.   Cardiovascular: Negative for chest pain and leg swelling.  Gastrointestinal: Positive for constipation following treatment.  Positive for intermittent nausea and vomiting.  Negative for abdominal pain, and diarrhea.  Genitourinary: Negative for bladder incontinence, difficulty urinating, dysuria, frequency and hematuria.   Musculoskeletal: Negative for back pain, gait problem, neck pain and neck stiffness.  Skin: Negative for itching and rash.  Neurological: Negative for dizziness, extremity weakness, gait problem, headaches, light-headedness and seizures.  Hematological: Negative for adenopathy. Does not bruise/bleed easily.  Psychiatric/Behavioral: Negative for confusion, depression and sleep disturbance. The patient is not nervous/anxious.     PHYSICAL EXAMINATION:  Blood pressure 118/74, pulse 75, temperature 97.6 F (36.4 C), resp. rate 18, weight 166 lb 5 oz (75.4 kg), SpO2 94 %.  ECOG PERFORMANCE STATUS: 2  Physical Exam  Constitutional: Oriented to person, place, and time and thin appearing male and in no distress. HENT: Head: Normocephalic and atraumatic. Mouth/Throat: Oropharynx is clear and moist. No oropharyngeal exudate. Eyes: Right eye ptosis. Conjunctivae are normal. Right eye exhibits no discharge. Left eye exhibits no discharge. No scleral icterus. Neck: Normal range of motion. Neck supple. Cardiovascular: Normal rate, regular rhythm, normal heart sounds and intact distal pulses.   Pulmonary/Chest: Effort normal. Decreased breath sounds majority of right lung. No respiratory  distress. No wheezes. No rales. Dullness to percussion in right lung.  Abdominal: Soft. Bowel sounds are normal. Exhibits no distension and no mass. There is no tenderness.  Musculoskeletal: Normal range of motion. Exhibits no edema.  Lymphadenopathy:    No cervical adenopathy.  Neurological: Alert and oriented to person, place, and time. Exhibits normal muscle tone. Gait normal. Coordination normal. Skin: Skin is warm and dry. No rash noted. Not diaphoretic. No erythema. No pallor.  Psychiatric: Mood, memory and judgment normal. Vitals reviewed.    LABORATORY DATA: Lab Results  Component Value Date   WBC 8.2 12/28/2020   HGB 11.5 (L) 12/28/2020   HCT 34.0 (L) 12/28/2020   MCV 106.3 (H) 12/28/2020   PLT 357 12/28/2020      Chemistry      Component Value Date/Time   NA 143 12/28/2020 1359   K 3.9 12/28/2020 1359   CL 106 12/28/2020 1359   CO2 22 12/28/2020 1359   BUN 17 12/28/2020 1359   CREATININE 1.14 12/28/2020 1359   CREATININE 1.13 10/16/2013 1118      Component Value Date/Time   CALCIUM 9.4 12/28/2020 1359   ALKPHOS 86 12/28/2020 1359   AST 19 12/28/2020 1359   ALT 16 12/28/2020 1359   BILITOT 0.8 12/28/2020 1359       RADIOGRAPHIC STUDIES:  DG Chest 1 View  Result Date: 12/09/2020 CLINICAL DATA:  Status post right thoracentesis EXAM: CHEST  1 VIEW COMPARISON:  Thoracentesis earlier same day FINDINGS: The cardiomediastinal silhouette is within normal limits. Residual moderate to large right pleural effusion. No pneumothorax. Implantable cardiac monitor. IMPRESSION: Status post right thoracentesis without pneumothorax. There remains a residual moderate to large right pleural effusion. Electronically Signed   By: Albin Felling M.D.   On: 12/09/2020 16:26   CUP PACEART REMOTE DEVICE CHECK  Result Date: 12/20/2020 ILR summary report received. Battery status OK. Normal device function. No new symptom, tachy, brady, or pause episodes. AF burden 100%. +OAC per prior  remote. Monthly summary reports and ROV/PRN  IR IMAGING GUIDED PORT INSERTION  Result Date: 12/12/2020 CLINICAL DATA:  Adenocarcinoma the lung and need for porta cath for continued chemotherapy and IV access needs. EXAM: IMPLANTED PORT A CATH PLACEMENT WITH ULTRASOUND AND FLUOROSCOPIC GUIDANCE ANESTHESIA/SEDATION: 2.0 mg IV Versed; 100 mcg IV Fentanyl Total Moderate Sedation Time:  36 minutes The patient's level of consciousness and physiologic status were continuously monitored during the procedure by Radiology nursing. FLUOROSCOPY TIME:  30 seconds.  3.0 mGy. PROCEDURE: The procedure, risks, benefits, and alternatives were explained to the patient. Questions regarding the procedure were encouraged and answered. The patient understands and consents to the procedure. A time-out was performed prior to initiating the procedure. Ultrasound was utilized to confirm patency of the right internal jugular vein. The right neck and chest were prepped with  chlorhexidine in a sterile fashion, and a sterile drape was applied covering the operative field. Maximum barrier sterile technique with sterile gowns and gloves were used for the procedure. Local anesthesia was provided with 1% lidocaine. After creating a small venotomy incision, a 21 gauge needle was advanced into the right internal jugular vein under direct, real-time ultrasound guidance. Ultrasound image documentation was performed. After securing guidewire access, an 8 Fr dilator was placed. A J-wire was kinked to measure appropriate catheter length. A subcutaneous port pocket was then created along the upper chest wall utilizing sharp and blunt dissection. Portable cautery was utilized. The pocket was irrigated with sterile saline. A single lumen power injectable port was chosen for placement. The 8 Fr catheter was tunneled from the port pocket site to the venotomy incision. The port was placed in the pocket. External catheter was trimmed to appropriate length  based on guidewire measurement. At the venotomy, an 8 Fr peel-away sheath was placed over a guidewire. The catheter was then placed through the sheath and the sheath removed. Final catheter positioning was confirmed and documented with a fluoroscopic spot image. The port was accessed with a needle and aspirated and flushed with heparinized saline. The access needle was removed. The venotomy and port pocket incisions were closed with subcutaneous 3-0 Monocryl and subcuticular 4-0 Vicryl. Dermabond was applied to both incisions. COMPLICATIONS: COMPLICATIONS None FINDINGS: After catheter placement, the tip lies at the cavo-atrial junction. The catheter aspirates normally and is ready for immediate use. IMPRESSION: Placement of single lumen port a cath via right internal jugular vein. The catheter tip lies at the cavo-atrial junction. A power injectable port a cath was placed and is ready for immediate use. Electronically Signed   By: Aletta Edouard M.D.   On: 12/12/2020 14:07   US Thoracentesis Asp Pleural space w/IMG guide  Result Date: 12/09/2020 INDICATION: Patient with history of non-small cell lung cancer with recurrent right pleural effusion. Request for therapeutic thoracentesis. EXAM: ULTRASOUND GUIDED RIGHT THORACENTESIS MEDICATIONS: 1% plain lidocaine, 5 mL COMPLICATIONS: None immediate. PROCEDURE: An ultrasound guided thoracentesis was thoroughly discussed with the patient and questions answered. The benefits, risks, alternatives and complications were also discussed. The patient understands and wishes to proceed with the procedure. Written consent was obtained. Ultrasound was performed to localize and mark an adequate pocket of fluid in the right chest. The area was then prepped and draped in the normal sterile fashion. 1% Lidocaine was used for local anesthesia. Under ultrasound guidance a 6 Fr Safe-T-Centesis catheter was introduced. Thoracentesis was performed. The catheter was removed and a dressing  applied. FINDINGS: A total of approximately 2.3 L of clear, dark yellow fluid was removed. IMPRESSION: Successful ultrasound guided right thoracentesis yielding 2.3 L of pleural fluid. Read by: Ascencion Dike PA-C Electronically Signed   By: Aletta Edouard M.D.   On: 12/09/2020 16:29     ASSESSMENT/PLAN:  This is a very pleasant 70 year old Caucasian male diagnosed with a stage IV (T3, N2, M1 a) non-small cell lung cancer, adenocarcinoma presented with large right upper lobe lung mass with extension to the right suprahilar region as well as right hilar and mediastinal lymphadenopathy with obstruction of the right upper lobe and bilateral pulmonary nodules diagnosed in July 2021. Negative PDL1 expression. He has no actionable mutations   The patient completed palliative radiotherapy to the obstructive lung mass under the care of Dr. Lisbeth Renshaw. The last treatment was in August 2021   The patient underwent systemic chemotherapy with carboplatin  for AUC of 5, Alimta 500 mg/M2, and Keytruda 200 mg IV every 3 weeks status post 9 cycles.  Starting from cycle #5 he was on maintenance treatment with Alimta and Keytruda every 3 weeks. This treatment was discontinued secondary to disease progression.  The patient underwent palliative second line systemic chemotherapy with docetaxel 75 mg/M2 and Cyramza 10 mg/KG IV every 3 weeks with Neulasta support.  Starting on May 18, 2020.  Status post 2 cycles. The patient has a rough time tolerating this treatment. He had evidence of disease progression with multiple bilateral pulmonary nodules but stable disease in the dominant mass and right pleural effusion.   His treatment was discontinued and he is currently undergoing palliative systemic chemotherapy with single agent gemcitabine 1000 MG/M2 IV on days 1 and 8 every 3 weeks. He is status 8 cycles. His dose was reduced to 800 mg/m2.   The patient was seen with Dr. Julien Nordmann. Labs were reviewed. Recommend that he proceed  with cycle #9 today as scheduled.   Given the physical exam findings, it is likely that the patient has reaccumulated his right-sided pleural effusion.  He is symptomatic with increased cough and difficulties lying flat.  I will place an order for stat ultrasound-guided thoracentesis.  Of course, the patient knows that if he develops significant shortness of breath in the interval prior to his procedure that he needs to seek emergency room evaluation.  I will also arrange for restaging CT scan of the chest, abdomen, and pelvis prior to starting his next cycle of treatment in 3 weeks.  We will see him back for a follow up visit in 3 weeks for evaluation and to review his scan results before starting cycle #10.   I have refilled his Hycodan.  The patient was advised to call immediately if he has any concerning symptoms in the interval. The patient voices understanding of current disease status and treatment options and is in agreement with the current care plan. All questions were answered. The patient knows to call the clinic with any problems, questions or concerns. We can certainly see the patient much sooner if necessary        Orders Placed This Encounter  Procedures   CT Chest W Contrast    Standing Status:   Future    Standing Expiration Date:   12/28/2021    Order Specific Question:   If indicated for the ordered procedure, I authorize the administration of contrast media per Radiology protocol    Answer:   Yes    Order Specific Question:   Preferred imaging location?    Answer:   [facility]   CT Abdomen Pelvis W Contrast    Standing Status:   Future    Standing Expiration Date:   12/28/2021    Order Specific Question:   If indicated for the ordered procedure, I authorize the administration of contrast media per Radiology protocol    Answer:   Yes    Order Specific Question:   Preferred imaging location?    Answer:   [facility]    Order Specific Question:    Is Oral Contrast requested for this exam?    Answer:   Yes, Per Radiology protocol   US Thoracentesis Asp Pleural space w/IMG guide    Standing Status:   Future    Standing Expiration Date:   12/28/2021    Order Specific Question:   Are labs required for specimen collection?    Answer:  No    Order Specific Question:   Reason for Exam (SYMPTOM  OR DIAGNOSIS REQUIRED)    Answer:   Lung cancer, hx of recurrent right sided pleural effusion and signs of recurrant effusion    Order Specific Question:   Preferred imaging location?    Answer:   [place], PA-C 12/28/20  ADDENDUM: Hematology/Oncology Attending: I had a face-to-face encounter with the patient today.  I reviewed his record, lab and recommended his care plan.  This is a very pleasant 70 years old white male diagnosed with a stage IV non-small cell lung cancer, adenocarcinoma in July 2021 with negative PD-L1 expression and no actionable mutation.  The patient underwent several systemic chemotherapy regimens including carboplatin, Alimta and Keytruda followed by second line chemotherapy with docetaxel and Cyramza discontinued secondary to disease progression.  The patient is currently undergoing systemic chemotherapy with single agent gemcitabine 800 Mg/M2 on days 1 and 8 every 3 weeks status post 5 cycles.  He has been tolerating this treatment much better except for the persistent fatigue.  He missed day 8 of cycle #5 because of leukocytopenia. I recommended for the patient to proceed with cycle #6 today as planned. We will see him back for follow-up visit in 3 weeks for evaluation with repeat CT scan of the chest, abdomen pelvis for restaging of his disease. For the enlarging and recurrent right pleural effusion, will arrange for the patient to have therapy at ultrasound-guided right thoracentesis. He was advised to call immediately if he has any other concerning symptoms in the  interval.  Disclaimer: This note was dictated with voice recognition software. Similar sounding words can inadvertently be transcribed and may be missed upon review. Eilleen Kempf, MD 12/28/20

## 2020-12-28 ENCOUNTER — Other Ambulatory Visit: Payer: Medicare HMO

## 2020-12-28 ENCOUNTER — Inpatient Hospital Stay (HOSPITAL_BASED_OUTPATIENT_CLINIC_OR_DEPARTMENT_OTHER): Payer: Medicare HMO | Admitting: Physician Assistant

## 2020-12-28 ENCOUNTER — Inpatient Hospital Stay: Payer: Medicare HMO

## 2020-12-28 ENCOUNTER — Other Ambulatory Visit: Payer: Self-pay

## 2020-12-28 ENCOUNTER — Other Ambulatory Visit: Payer: Self-pay | Admitting: Physician Assistant

## 2020-12-28 VITALS — BP 118/74 | HR 75 | Temp 97.6°F | Resp 18 | Wt 166.3 lb

## 2020-12-28 DIAGNOSIS — Z5111 Encounter for antineoplastic chemotherapy: Secondary | ICD-10-CM | POA: Diagnosis not present

## 2020-12-28 DIAGNOSIS — K5900 Constipation, unspecified: Secondary | ICD-10-CM | POA: Diagnosis not present

## 2020-12-28 DIAGNOSIS — R112 Nausea with vomiting, unspecified: Secondary | ICD-10-CM | POA: Diagnosis not present

## 2020-12-28 DIAGNOSIS — R5383 Other fatigue: Secondary | ICD-10-CM | POA: Diagnosis not present

## 2020-12-28 DIAGNOSIS — J90 Pleural effusion, not elsewhere classified: Secondary | ICD-10-CM

## 2020-12-28 DIAGNOSIS — E119 Type 2 diabetes mellitus without complications: Secondary | ICD-10-CM | POA: Diagnosis not present

## 2020-12-28 DIAGNOSIS — R531 Weakness: Secondary | ICD-10-CM | POA: Diagnosis not present

## 2020-12-28 DIAGNOSIS — R059 Cough, unspecified: Secondary | ICD-10-CM

## 2020-12-28 DIAGNOSIS — C3411 Malignant neoplasm of upper lobe, right bronchus or lung: Secondary | ICD-10-CM

## 2020-12-28 DIAGNOSIS — R0609 Other forms of dyspnea: Secondary | ICD-10-CM | POA: Diagnosis not present

## 2020-12-28 DIAGNOSIS — Z95828 Presence of other vascular implants and grafts: Secondary | ICD-10-CM

## 2020-12-28 DIAGNOSIS — I10 Essential (primary) hypertension: Secondary | ICD-10-CM | POA: Diagnosis not present

## 2020-12-28 LAB — CBC WITH DIFFERENTIAL (CANCER CENTER ONLY)
Abs Immature Granulocytes: 0.04 10*3/uL (ref 0.00–0.07)
Basophils Absolute: 0.1 10*3/uL (ref 0.0–0.1)
Basophils Relative: 1 %
Eosinophils Absolute: 0.1 10*3/uL (ref 0.0–0.5)
Eosinophils Relative: 1 %
HCT: 34 % — ABNORMAL LOW (ref 39.0–52.0)
Hemoglobin: 11.5 g/dL — ABNORMAL LOW (ref 13.0–17.0)
Immature Granulocytes: 1 %
Lymphocytes Relative: 9 %
Lymphs Abs: 0.8 10*3/uL (ref 0.7–4.0)
MCH: 35.9 pg — ABNORMAL HIGH (ref 26.0–34.0)
MCHC: 33.8 g/dL (ref 30.0–36.0)
MCV: 106.3 fL — ABNORMAL HIGH (ref 80.0–100.0)
Monocytes Absolute: 1 10*3/uL (ref 0.1–1.0)
Monocytes Relative: 12 %
Neutro Abs: 6.3 10*3/uL (ref 1.7–7.7)
Neutrophils Relative %: 76 %
Platelet Count: 357 10*3/uL (ref 150–400)
RBC: 3.2 MIL/uL — ABNORMAL LOW (ref 4.22–5.81)
RDW: 18.3 % — ABNORMAL HIGH (ref 11.5–15.5)
WBC Count: 8.2 10*3/uL (ref 4.0–10.5)
nRBC: 0 % (ref 0.0–0.2)

## 2020-12-28 LAB — CMP (CANCER CENTER ONLY)
ALT: 16 U/L (ref 0–44)
AST: 19 U/L (ref 15–41)
Albumin: 3.2 g/dL — ABNORMAL LOW (ref 3.5–5.0)
Alkaline Phosphatase: 86 U/L (ref 38–126)
Anion gap: 15 (ref 5–15)
BUN: 17 mg/dL (ref 8–23)
CO2: 22 mmol/L (ref 22–32)
Calcium: 9.4 mg/dL (ref 8.9–10.3)
Chloride: 106 mmol/L (ref 98–111)
Creatinine: 1.14 mg/dL (ref 0.61–1.24)
GFR, Estimated: >60 mL/min (ref >60)
Glucose, Bld: 106 mg/dL — ABNORMAL HIGH (ref 70–99)
Potassium: 3.9 mmol/L (ref 3.5–5.1)
Sodium: 143 mmol/L (ref 135–145)
Total Bilirubin: 0.8 mg/dL (ref 0.3–1.2)
Total Protein: 6.4 g/dL — ABNORMAL LOW (ref 6.5–8.1)

## 2020-12-28 MED ORDER — SODIUM CHLORIDE 0.9 % IV SOLN
800.0000 mg/m2 | Freq: Once | INTRAVENOUS | Status: AC
  Administered 2020-12-28: 1634 mg via INTRAVENOUS
  Filled 2020-12-28: qty 42.98

## 2020-12-28 MED ORDER — HYDROCODONE BIT-HOMATROP MBR 5-1.5 MG/5ML PO SOLN: 5.0000 mL | Freq: Four times a day (QID) | ORAL | 0 refills | Status: DC | PRN

## 2020-12-28 MED ORDER — SODIUM CHLORIDE 0.9 % IV SOLN: Freq: Once | INTRAVENOUS | Status: AC

## 2020-12-28 MED ORDER — SODIUM CHLORIDE 0.9% FLUSH
10.0000 mL | INTRAVENOUS | Status: DC | PRN
  Administered 2020-12-28: 10 mL

## 2020-12-28 MED ORDER — SODIUM CHLORIDE 0.9% FLUSH
10.0000 mL | INTRAVENOUS | Status: AC | PRN
  Administered 2020-12-28: 10 mL

## 2020-12-28 MED ORDER — HEPARIN SOD (PORK) LOCK FLUSH 100 UNIT/ML IV SOLN
500.0000 [IU] | Freq: Once | INTRAVENOUS | Status: AC | PRN
  Administered 2020-12-28: 500 [IU]

## 2020-12-28 MED ORDER — PROCHLORPERAZINE MALEATE 10 MG PO TABS: 10.0000 mg | ORAL_TABLET | Freq: Once | ORAL | Status: DC

## 2020-12-28 NOTE — Patient Instructions (Signed)
[facility]COL[facility]  Discharge Instructions: Thank you for choosing [facility] to provide your oncology and hematology care.   If you have a lab appointment with the Cancer Center, please go directly to the Cancer Center and check in at the registration area.   Wear comfortable clothing and clothing appropriate for easy access to any Portacath or PICC line.   We strive to give you quality time with your provider. You may need to reschedule your appointment if you arrive late (15 or more minutes).  Arriving late affects you and other patients whose appointments are after yours.  Also, if you miss three or more appointments without notifying the office, you may be dismissed from the clinic at the provider's discretion.      For prescription refill requests, have your pharmacy contact our office and allow 72 hours for refills to be completed.    Today you received the following chemotherapy and/or immunotherapy agents Gemzar      To help prevent nausea and vomiting after your treatment, we encourage you to take your nausea medication as directed.  BELOW ARE SYMPTOMS THAT SHOULD BE REPORTED IMMEDIATELY: *FEVER GREATER THAN 100.4 F (38 C) OR HIGHER *CHILLS OR SWEATING *NAUSEA AND VOMITING THAT IS NOT CONTROLLED WITH YOUR NAUSEA MEDICATION *UNUSUAL SHORTNESS OF BREATH *UNUSUAL BRUISING OR BLEEDING *URINARY PROBLEMS (pain or burning when urinating, or frequent urination) *BOWEL PROBLEMS (unusual diarrhea, constipation, pain near the anus) TENDERNESS IN MOUTH AND THROAT WITH OR WITHOUT PRESENCE OF ULCERS (sore throat, sores in mouth, or a toothache) UNUSUAL RASH, SWELLING OR PAIN  UNUSUAL VAGINAL DISCHARGE OR ITCHING   Items with * indicate a potential emergency and should be followed up as soon as possible or go to the Emergency Department if any problems should occur.  Please show the CHEMOTHERAPY ALERT CARD or IMMUNOTHERAPY ALERT CARD at check-in to the  Emergency Department and triage nurse.  Should you have questions after your visit or need to cancel or reschedule your appointment, please contact [facility]  Dept: [phone]  and follow the prompts.  Office hours are 8:00 a.m. to 4:30 p.m. Monday - Friday. Please note that voicemails left after 4:00 p.m. may not be returned until the following business day.  We are closed weekends and major holidays. You have access to a nurse at all times for urgent questions. Please call the main number to the clinic Dept: [phone] and follow the prompts.   For any non-urgent questions, you may also contact your provider using MyChart. We now offer e-Visits for anyone 18 and older to request care online for non-urgent symptoms. For details visit [email or web address].   Also download the MyChart app! Go to the app store, search "MyChart", open the app, select [facility], and log in with your MyChart username and password.  Due to Covid, a mask is required upon entering the hospital/clinic. If you do not have a mask, one will be given to you upon arrival. For doctor visits, patients may have 1 support person aged 18 or older with them. For treatment visits, patients cannot have anyone with them due to current Covid guidelines and our immunocompromised population.   

## 2020-12-29 ENCOUNTER — Telehealth: Payer: Self-pay | Admitting: Medical Oncology
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
## 2020-12-30 ENCOUNTER — Ambulatory Visit (HOSPITAL_COMMUNITY)
Admission: RE | Admit: 2020-12-30 | Discharge: 2020-12-30 | Disposition: A | Discharge status: Home / Self Care | Payer: Medicare HMO | Source: Ambulatory Visit | Attending: Physician Assistant | Admitting: Physician Assistant

## 2020-12-30 ENCOUNTER — Other Ambulatory Visit: Payer: Self-pay

## 2020-12-30 ENCOUNTER — Other Ambulatory Visit (HOSPITAL_COMMUNITY): Payer: Self-pay | Admitting: Internal Medicine

## 2020-12-30 ENCOUNTER — Ambulatory Visit (HOSPITAL_COMMUNITY)
Admission: RE | Admit: 2020-12-30 | Discharge: 2020-12-30 | Disposition: A | Discharge status: Home / Self Care | Payer: Medicare HMO | Source: Ambulatory Visit | Attending: Internal Medicine | Admitting: Internal Medicine

## 2020-12-30 DIAGNOSIS — J90 Pleural effusion, not elsewhere classified: Secondary | ICD-10-CM | POA: Diagnosis not present

## 2020-12-30 DIAGNOSIS — Z9889 Other specified postprocedural states: Secondary | ICD-10-CM | POA: Diagnosis not present

## 2020-12-30 DIAGNOSIS — J9811 Atelectasis: Secondary | ICD-10-CM | POA: Diagnosis not present

## 2020-12-30 DIAGNOSIS — C3490 Malignant neoplasm of unspecified part of unspecified bronchus or lung: Secondary | ICD-10-CM | POA: Diagnosis not present

## 2020-12-30 MED ORDER — LIDOCAINE HCL 1 % IJ SOLN
INTRAMUSCULAR | Status: AC
  Filled 2020-12-30: qty 20

## 2020-12-30 NOTE — Procedures (Addendum)
PROCEDURE SUMMARY:  Successful US guided right thoracentesis. Yielded 1.8 L of clear, amber fluid Pt tolerated procedure well. No immediate complications.  Specimen not sent for labs. CXR ordered.  EBL <1 mL  Tyson Alias, NP 12/30/2020 10:18 AM

## 2021-01-04 ENCOUNTER — Other Ambulatory Visit: Payer: Self-pay

## 2021-01-04 ENCOUNTER — Inpatient Hospital Stay: Payer: Medicare HMO | Attending: Internal Medicine

## 2021-01-04 ENCOUNTER — Inpatient Hospital Stay: Payer: Medicare HMO

## 2021-01-04 ENCOUNTER — Other Ambulatory Visit: Payer: Medicare HMO

## 2021-01-04 ENCOUNTER — Inpatient Hospital Stay (HOSPITAL_BASED_OUTPATIENT_CLINIC_OR_DEPARTMENT_OTHER): Payer: Medicare HMO | Admitting: Physician Assistant

## 2021-01-04 VITALS — BP 128/78 | HR 68 | Temp 98.3°F | Resp 18 | Wt 164.0 lb

## 2021-01-04 DIAGNOSIS — Z923 Personal history of irradiation: Secondary | ICD-10-CM | POA: Insufficient documentation

## 2021-01-04 DIAGNOSIS — C3411 Malignant neoplasm of upper lobe, right bronchus or lung: Secondary | ICD-10-CM | POA: Diagnosis present

## 2021-01-04 DIAGNOSIS — Z95828 Presence of other vascular implants and grafts: Secondary | ICD-10-CM

## 2021-01-04 DIAGNOSIS — Z5111 Encounter for antineoplastic chemotherapy: Secondary | ICD-10-CM | POA: Diagnosis not present

## 2021-01-04 DIAGNOSIS — Z79899 Other long term (current) drug therapy: Secondary | ICD-10-CM | POA: Diagnosis not present

## 2021-01-04 DIAGNOSIS — L8990 Pressure ulcer of unspecified site, unspecified stage: Secondary | ICD-10-CM | POA: Insufficient documentation

## 2021-01-04 DIAGNOSIS — L89312 Pressure ulcer of right buttock, stage 2: Secondary | ICD-10-CM

## 2021-01-04 LAB — CMP (CANCER CENTER ONLY)
ALT: 67 U/L — ABNORMAL HIGH (ref 0–44)
AST: 58 U/L — ABNORMAL HIGH (ref 15–41)
Albumin: 2.9 g/dL — ABNORMAL LOW (ref 3.5–5.0)
Alkaline Phosphatase: 78 U/L (ref 38–126)
Anion gap: 12 (ref 5–15)
BUN: 18 mg/dL (ref 8–23)
CO2: 24 mmol/L (ref 22–32)
Calcium: 9.1 mg/dL (ref 8.9–10.3)
Chloride: 105 mmol/L (ref 98–111)
Creatinine: 1.03 mg/dL (ref 0.61–1.24)
GFR, Estimated: >60 mL/min (ref >60)
Glucose, Bld: 122 mg/dL — ABNORMAL HIGH (ref 70–99)
Potassium: 4.4 mmol/L (ref 3.5–5.1)
Sodium: 141 mmol/L (ref 135–145)
Total Bilirubin: 0.6 mg/dL (ref 0.3–1.2)
Total Protein: 6 g/dL — ABNORMAL LOW (ref 6.5–8.1)

## 2021-01-04 LAB — CBC WITH DIFFERENTIAL (CANCER CENTER ONLY)
Abs Immature Granulocytes: 0.05 10*3/uL (ref 0.00–0.07)
Basophils Absolute: 0.1 10*3/uL (ref 0.0–0.1)
Basophils Relative: 2 %
Eosinophils Absolute: 0 10*3/uL (ref 0.0–0.5)
Eosinophils Relative: 0 %
HCT: 32.4 % — ABNORMAL LOW (ref 39.0–52.0)
Hemoglobin: 10.8 g/dL — ABNORMAL LOW (ref 13.0–17.0)
Immature Granulocytes: 2 %
Lymphocytes Relative: 18 %
Lymphs Abs: 0.6 10*3/uL — ABNORMAL LOW (ref 0.7–4.0)
MCH: 35.5 pg — ABNORMAL HIGH (ref 26.0–34.0)
MCHC: 33.3 g/dL (ref 30.0–36.0)
MCV: 106.6 fL — ABNORMAL HIGH (ref 80.0–100.0)
Monocytes Absolute: 0.6 10*3/uL (ref 0.1–1.0)
Monocytes Relative: 18 %
Neutro Abs: 1.9 10*3/uL (ref 1.7–7.7)
Neutrophils Relative %: 60 %
Platelet Count: 194 10*3/uL (ref 150–400)
RBC: 3.04 MIL/uL — ABNORMAL LOW (ref 4.22–5.81)
RDW: 17.2 % — ABNORMAL HIGH (ref 11.5–15.5)
WBC Count: 3.2 10*3/uL — ABNORMAL LOW (ref 4.0–10.5)
nRBC: 0.6 % — ABNORMAL HIGH (ref 0.0–0.2)

## 2021-01-04 MED ORDER — SODIUM CHLORIDE 0.9% FLUSH
10.0000 mL | Freq: Once | INTRAVENOUS | Status: AC
  Administered 2021-01-04: 10 mL via INTRAVENOUS

## 2021-01-04 MED ORDER — PROCHLORPERAZINE MALEATE 10 MG PO TABS: 10.0000 mg | ORAL_TABLET | Freq: Once | ORAL | Status: DC

## 2021-01-04 MED ORDER — HEPARIN SOD (PORK) LOCK FLUSH 100 UNIT/ML IV SOLN
500.0000 [IU] | Freq: Once | INTRAVENOUS | Status: AC | PRN
  Administered 2021-01-04: 500 [IU]

## 2021-01-04 MED ORDER — SODIUM CHLORIDE 0.9% FLUSH
10.0000 mL | INTRAVENOUS | Status: DC | PRN
  Administered 2021-01-04: 10 mL

## 2021-01-04 MED ORDER — SODIUM CHLORIDE 0.9 % IV SOLN
800.0000 mg/m2 | Freq: Once | INTRAVENOUS | Status: AC
  Administered 2021-01-04: 1634 mg via INTRAVENOUS
  Filled 2021-01-04: qty 42.98

## 2021-01-04 MED ORDER — SODIUM CHLORIDE 0.9 % IV SOLN: Freq: Once | INTRAVENOUS | Status: AC

## 2021-01-04 NOTE — Patient Instructions (Addendum)
[facility]   Discharge Instructions: Thank you for choosing [facility] to provide your oncology and hematology care.   If you have a lab appointment with the [facility], please go directly to the [facility] and check in at the registration area.   Wear comfortable clothing and clothing appropriate for easy access to any Portacath or PICC line.   We strive to give you quality time with your provider. You may need to reschedule your appointment if you arrive late (15 or more minutes).  Arriving late affects you and other patients whose appointments are after yours.  Also, if you miss three or more appointments without notifying the office, you may be dismissed from the clinic at the provider's discretion.      For prescription refill requests, have your pharmacy contact our office and allow 72 hours for refills to be completed.    Today you received the following chemotherapy and/or immunotherapy agents: gemcitabine.      To help prevent nausea and vomiting after your treatment, we encourage you to take your nausea medication as directed.  BELOW ARE SYMPTOMS THAT SHOULD BE REPORTED IMMEDIATELY: *FEVER GREATER THAN 100.4 F (38 C) OR HIGHER *CHILLS OR SWEATING *NAUSEA AND VOMITING THAT IS NOT CONTROLLED WITH YOUR NAUSEA MEDICATION *UNUSUAL SHORTNESS OF BREATH *UNUSUAL BRUISING OR BLEEDING *URINARY PROBLEMS (pain or burning when urinating, or frequent urination) *BOWEL PROBLEMS (unusual diarrhea, constipation, pain near the anus) TENDERNESS IN MOUTH AND THROAT WITH OR WITHOUT PRESENCE OF ULCERS (sore throat, sores in mouth, or a toothache) UNUSUAL RASH, SWELLING OR PAIN  UNUSUAL VAGINAL DISCHARGE OR ITCHING   Items with * indicate a potential emergency and should be followed up as soon as possible or go to the Emergency Department if any problems should occur.  Please show the CHEMOTHERAPY ALERT CARD or IMMUNOTHERAPY ALERT CARD at  check-in to the Emergency Department and triage nurse.  Should you have questions after your visit or need to cancel or reschedule your appointment, please contact [facility]  Dept: [phone]  and follow the prompts.  Office hours are 8:00 a.m. to 4:30 p.m. Monday - Friday. Please note that voicemails left after 4:00 p.m. may not be returned until the following business day.  We are closed weekends and major holidays. You have access to a nurse at all times for urgent questions. Please call the main number to the clinic Dept: [phone] and follow the prompts.   For any non-urgent questions, you may also contact your provider using MyChart. We now offer e-Visits for anyone 16 and older to request care online for non-urgent symptoms. For details visit [email or web address].   Also download the MyChart app! Go to the app store, search "MyChart", open the app, select [facility], and log in with your MyChart username and password.  Due to Covid, a mask is required upon entering the hospital/clinic. If you do not have a mask, one will be given to you upon arrival. For doctor visits, patients may have 1 support person aged 24 or older with them. For treatment visits, patients cannot have anyone with them due to current Covid guidelines and our immunocompromised population.   Pressure Injury A pressure injury is damage to the skin and underlying tissue that results from pressure being applied to an area of the body. It often affects people who must spend a long time in a bed or chair because of a medical condition. Pressure injuries usually  occur: Over bony parts of the body, such as the tailbone, shoulders, elbows, hips, heels, spine, ankles, and back of the head. Under medical devices that make contact with the body, such as respiratory equipment, stockings, tubes, and splints. Pressure injuries start as reddened areas on the skin and can lead to pain and an open  wound. What are the causes? This condition is caused by frequent or constant pressure to an area of the body. Decreased blood flow to the skin can eventually cause the skin tissue to die and break down, causing a wound. What increases the risk? You are more likely to develop this condition if you: Are in the hospital or an extended care facility. Are bedridden or in a wheelchair. Have an injury or disease that keeps you from: Moving normally. Feeling pain or pressure. Have a condition that: Makes you sleepy or less alert. Causes poor blood flow. Need to wear a medical device. Have poor control of your bladder or bowel functions (incontinence). Have poor nutrition (malnutrition). If you are at risk for pressure injuries, your health care provider may recommend certain types of mattresses, mattress covers, pillows, cushions, or boots to help prevent them. These may include products filled with air, foam, gel, or sand. What are the signs or symptoms? Symptoms of this condition depend on the severity of the injury. Symptoms may include: Red or dark areas of the skin. Pain, warmth, or a change of skin texture. Blisters. An open wound. How is this diagnosed? This condition is diagnosed with a medical history and physical exam. You may also have tests, such as: Blood tests. Imaging tests. Blood flow tests. Your pressure injury will be staged based on its severity. Staging is based on: The depth of the tissue injury, including whether there is exposure of muscle, bone, or tendon. The cause of the pressure injury. How is this treated? This condition may be treated by: Relieving or redistributing pressure on your skin. This includes: Frequently changing your position. Avoiding positions that caused the wound or that can make the wound worse. Using specific bed mattresses, chair cushions, or protective boots. Moving medical devices from an area of pressure, or placing padding between the  skin and the device. Using foams, creams, or powders to prevent rubbing (friction) on the skin. Keeping your skin clean and dry. This may include using a skin cleanser or skin barrier as told by your health care provider. Cleaning your injury and removing any dead tissue from the wound (debridement). Placing a bandage (dressing) over your injury. Using medicines for pain or to prevent or treat infection. Surgery may be needed if other treatments are not working or if your injury is very deep. Follow these instructions at home: Wound care Follow instructions from your health care provider about how to take care of your wound. Make sure you: Wash your hands with soap and water before and after you change your bandage (dressing). If soap and water are not available, use hand sanitizer. Change your dressing as told by your health care provider.  Check your wound every day for signs of infection. Have a caregiver do this for you if you are not able. Check for: Redness, swelling, or increased pain. More fluid or blood. Warmth. Pus or a bad smell. Skin care Keep your skin clean and dry. Gently pat your skin dry. Do not rub or massage your skin. You or a caregiver should check your skin every day for any changes in color or any new blisters  or sores (ulcers). Medicines Take over-the-counter and prescription medicines only as told by your health care provider. If you were prescribed an antibiotic medicine, take or apply it as told by your health care provider. Do not stop using the antibiotic even if your condition improves. Reducing and redistributing pressure Do not lie or sit in one position for a long time. Move or change position every 1-2 hours, or as told by your health care provider. Use pillows or cushions to reduce pressure. Ask your health care provider to recommend cushions or pads for you. General instructions  Eat a healthy diet that includes lots of protein. Drink enough fluid to  keep your urine pale yellow. Be as active as you can every day. Ask your health care provider to suggest safe exercises or activities. Do not abuse drugs or alcohol. Do not use any products that contain nicotine or tobacco, such as cigarettes, e-cigarettes, and chewing tobacco. If you need help quitting, ask your health care provider. Keep all follow-up visits as told by your health care provider. This is important. Contact a health care provider if: You have: A fever or chills. Pain that is not helped by medicine. Any changes in skin color. New blisters or sores. Pus or a bad smell coming from your wound. Redness, swelling, or pain around your wound. More fluid or blood coming from your wound. Your wound does not improve after 1-2 weeks of treatment. Summary A pressure injury is damage to the skin and underlying tissue that results from pressure being applied to an area of the body. Do not lie or sit in one position for a long time. Your health care provider may advise you to move or change position every 1-2 hours. Follow instructions from your health care provider about how to take care of your wound. Keep all follow-up visits as told by your health care provider. This is important. This information is not intended to replace advice given to you by your health care provider. Make sure you discuss any questions you have with your health care provider. Document Revised: 10/16/2017 Document Reviewed: 10/16/2017 [employer] Patient Education  [place].

## 2021-01-04 NOTE — Progress Notes (Signed)
[facility] OFFICE PROGRESS NOTE  Copland, Gay Filler, MD [address] [place] [address]  DIAGNOSIS: Stage IV (T3, N2, M1a) non-small cell lung cancer, adenocarcinoma presented with large right upper lobe lung mass extending to the right suprahilar region in addition to right hilar and mediastinal lymphadenopathy with obstruction of the associated upper lobe bronchus and bilateral subcentimeter pulmonary nodules diagnosed in July 2021.   Biomarker Findings Microsatellite status - MS-Stable Tumor Mutational Burden - 3 Muts/Mb Genomic Findings For a complete list of the genes assayed, please refer to the Appendix. ERBB2 [id number], amplification - equivocal? HGF amplification - equivocal? MTAP loss exons 5-8 MYC amplification - equivocal? CDKN2A/B CDKN2B loss, CDKN2A loss CUL3 Y61f*19 RPTOR amplification TP53 [id number] Disease relevant genes with no reportable alterations: ALK, BRAF, EGFR, KRAS, MET, RET, ROS1   PDL1 Expression: Negative  PRIOR THERAPY: 1) Palliative radiotherapy to the obstructive lung mass under the care of Dr. MoLisbeth RenshawThe last treatment was on 11/23/19 2) Systemic chemotherapy with carboplatin for AUC of 5, Alimta 500 mg/M2 and Keytruda 200 mg IV every 3 weeks.  First dose November 04, 2019.  Status post 9 cycles.  Starting from cycle #5 he was on maintenance treatment with Alimta and Keytruda every 3 weeks. 3) Second line systemic chemotherapy with docetaxel 75 mg/M2 and Cyramza 10 mg/KG every weeks with Neulasta support.  First dose May 18, 2020.  Status post 2 cycles.  Last dose was given June 17, 2020 discontinued secondary to disease progression.  CURRENT THERAPY: Third line systemic chemotherapy with single agent gemcitabine 1000 mg/M2 on days 1 and 8 every 3 weeks.  First dose July 13, 2020. Status post 9 cycles. His dose was reduced to 800 mg/m2.  INTERVAL HISTORY: Micheal Pankratz70.o. male returns to the  clinic and seen in the infusion room. He is here for day 8 cycle 9 today. He is being seen for a wound just below the gluteal cleft. The area is sore with clear/bloody discharge. The skin is broken down. There is no exposure muscle or bone. No fevers, surrounding erythema, warmth, or swelling. No purulent discharge. The patient is continent of stool and urine. The patient is thin appearing.    MEDICAL HISTORY: Past Medical History:  Diagnosis Date   Chicken pox    DM2 (diabetes mellitus, type 2) ([facility]   History of kidney stones    passed several    Hypertension    Kidney stones    5-6 times   Measles    Mumps    Stroke ([facility]   x 2, last one was 09/18/2019- has some expressive aphasia    ALLERGIES:  is allergic to bee venom.  MEDICATIONS:  Current Outpatient Medications  Medication Sig Dispense Refill   albuterol (VENTOLIN HFA) 108 (90 Base) MCG/ACT inhaler Inhale 2 puffs into the lungs every 6 (six) hours as needed for wheezing or shortness of breath. 8 g 2   apixaban (ELIQUIS) 5 MG TABS tablet Take 1 tablet (5 mg total) by mouth 2 (two) times daily. 60 tablet 11   atorvastatin (LIPITOR) 40 MG tablet TAKE ONE TABLET BY MOUTH ONE TIME DAILY 6PM 90 tablet 3   diltiazem (CARDIZEM CD) 180 MG 24 hr capsule Take 1 capsule (180 mg total) by mouth daily. 30 capsule 6   diphenhydrAMINE HCl, Sleep, (ZZZQUIL) 25 MG CAPS Take 25 mg by mouth at bedtime.     HYDROcodone bit-homatropine (HYCODAN) 5-1.5 MG/5ML syrup  Take 5 mLs by mouth every 6 (six) hours as needed for cough. 473 mL 0   lidocaine-prilocaine (EMLA) cream Apply to the Port-A-Cath site 30-60-minute before treatment 30 g 0   metFORMIN (GLUCOPHAGE) 500 MG tablet TAKE TWO TABLETS BY MOUTH TWICE A DAY WITH MEAL(S) 400 tablet 3   methylPREDNISolone (MEDROL DOSEPAK) 4 MG TBPK tablet Use as instructed. (Patient not taking: No sig reported) 21 tablet 0   Omega-3 Fatty Acids (FISH OIL) 1200 MG CAPS Take 1,200 mg by mouth daily.      prochlorperazine (COMPAZINE) 10 MG tablet Take 1 tablet (10 mg total) by mouth every 6 (six) hours as needed. 30 tablet 2   No current facility-administered medications for this visit.   Facility-Administered Medications Ordered in Other Visits  Medication Dose Route Frequency Provider Last Rate Last Admin   gemcitabine (GEMZAR) 1,634 mg in sodium chloride 0.9 % 250 mL chemo infusion  800 mg/m2 (Treatment Plan Recorded) Intravenous Once Curt Bears, MD       heparin lock flush 100 unit/mL  500 Units Intracatheter Once PRN Curt Bears, MD       prochlorperazine (COMPAZINE) tablet 10 mg  10 mg Oral Once Curt Bears, MD       sodium chloride flush (NS) 0.9 % injection 10 mL  10 mL Intracatheter PRN Curt Bears, MD        SURGICAL HISTORY:  Past Surgical History:  Procedure Laterality Date   COLONOSCOPY W/ POLYPECTOMY     IR IMAGING GUIDED PORT INSERTION  12/12/2020   VIDEO BRONCHOSCOPY WITH ENDOBRONCHIAL ULTRASOUND N/A 09/30/2019   Procedure: VIDEO BRONCHOSCOPY WITH ENDOBRONCHIAL ULTRASOUND;  Surgeon: Collene Gobble, MD;  Location: [facility];  Service: Thoracic;  Laterality: N/A;   WISDOM TOOTH EXTRACTION  1976-77    REVIEW OF SYSTEMS:   Review of Systems  Constitutional: Positive for fatigue. Negative for appetite change, chills, fever and unexpected weight change.  Eyes: Negative for eye problems and icterus.  Respiratory: Positive for dyspnea on exertion and cough.  Negative for hemoptysis and wheezing.  Genitourinary: Negative for bladder incontinence, difficulty urinating, dysuria, frequency and hematuria.   Musculoskeletal: Positive for sacral pain. Negative for back pain, gait problem, neck pain and neck stiffness.  Skin: Negative for itching and rash. Positive for skin wound in gluteal cleft. Psychiatric/Behavioral: Negative for confusion, depression and sleep disturbance. The patient is not nervous/anxious.     PHYSICAL EXAMINATION:  There were no vitals taken  for this visit.  ECOG PERFORMANCE STATUS: 2  Constitutional: Oriented to person, place, and time and thin appearing male and in no distress. HENT: Head: Normocephalic and atraumatic.  Eyes: Conjunctivae are normal. Right eye exhibits no discharge. Left eye exhibits no discharge. No scleral icterus.  Neck: Normal range of motion. Neck supple.   Musculoskeletal: Normal range of motion. Exhibits no edema.  Lymphadenopathy:    No cervical adenopathy.  Neurological: Alert and oriented to person, place, and time. Exhibits muscle wasting. Gait normal. Coordination normal.  Skin: Skin is warm and dry. Stage II pressure ulcer just below sacrum and gluteal cleft. No swelling, purulent discharge, or warmth.  Psychiatric: Mood, memory and judgment normal.  Vitals reviewed.  LABORATORY DATA: Lab Results  Component Value Date   WBC 3.2 (L) 01/04/2021   HGB 10.8 (L) 01/04/2021   HCT 32.4 (L) 01/04/2021   MCV 106.6 (H) 01/04/2021   PLT 194 01/04/2021      Chemistry      Component Value Date/Time  NA 141 01/04/2021 1356   K 4.4 01/04/2021 1356   CL 105 01/04/2021 1356   CO2 24 01/04/2021 1356   BUN 18 01/04/2021 1356   CREATININE 1.03 01/04/2021 1356   CREATININE 1.13 10/16/2013 1118      Component Value Date/Time   CALCIUM 9.1 01/04/2021 1356   ALKPHOS 78 01/04/2021 1356   AST 58 (H) 01/04/2021 1356   ALT 67 (H) 01/04/2021 1356   BILITOT 0.6 01/04/2021 1356       RADIOGRAPHIC STUDIES:  DG Chest 1 View  Result Date: 12/30/2020 CLINICAL DATA:  Post thoracentesis EXAM: CHEST  1 VIEW COMPARISON:  12/09/2020 FINDINGS: Persistent moderate right pleural effusion and associated right lung atelectasis. Appearance is similar to prior study. No pneumothorax. Increased small left pleural effusion and left basilar atelectasis. Heart size. IMPRESSION: No pneumothorax. Persistent moderate right pleural effusion and associated right lung atelectasis. Increased small left pleural effusion and  left basilar atelectasis. Electronically Signed   By: Macy Mis M.D.   On: 12/30/2020 10:31   DG Chest 1 View  Result Date: 12/09/2020 CLINICAL DATA:  Status post right thoracentesis EXAM: CHEST  1 VIEW COMPARISON:  Thoracentesis earlier same day FINDINGS: The cardiomediastinal silhouette is within normal limits. Residual moderate to large right pleural effusion. No pneumothorax. Implantable cardiac monitor. IMPRESSION: Status post right thoracentesis without pneumothorax. There remains a residual moderate to large right pleural effusion. Electronically Signed   By: Albin Felling M.D.   On: 12/09/2020 16:26   CUP PACEART REMOTE DEVICE CHECK  Result Date: 12/20/2020 ILR summary report received. Battery status OK. Normal device function. No new symptom, tachy, brady, or pause episodes. AF burden 100%. +OAC per prior remote. Monthly summary reports and ROV/PRN  IR IMAGING GUIDED PORT INSERTION  Result Date: 12/12/2020 CLINICAL DATA:  Adenocarcinoma the lung and need for porta cath for continued chemotherapy and IV access needs. EXAM: IMPLANTED PORT A CATH PLACEMENT WITH ULTRASOUND AND FLUOROSCOPIC GUIDANCE ANESTHESIA/SEDATION: 2.0 mg IV Versed; 100 mcg IV Fentanyl Total Moderate Sedation Time:  36 minutes The patient's level of consciousness and physiologic status were continuously monitored during the procedure by Radiology nursing. FLUOROSCOPY TIME:  30 seconds.  3.0 mGy. PROCEDURE: The procedure, risks, benefits, and alternatives were explained to the patient. Questions regarding the procedure were encouraged and answered. The patient understands and consents to the procedure. A time-out was performed prior to initiating the procedure. Ultrasound was utilized to confirm patency of the right internal jugular vein. The right neck and chest were prepped with chlorhexidine in a sterile fashion, and a sterile drape was applied covering the operative field. Maximum barrier sterile technique with sterile  gowns and gloves were used for the procedure. Local anesthesia was provided with 1% lidocaine. After creating a small venotomy incision, a 21 gauge needle was advanced into the right internal jugular vein under direct, real-time ultrasound guidance. Ultrasound image documentation was performed. After securing guidewire access, an 8 Fr dilator was placed. A J-wire was kinked to measure appropriate catheter length. A subcutaneous port pocket was then created along the upper chest wall utilizing sharp and blunt dissection. Portable cautery was utilized. The pocket was irrigated with sterile saline. A single lumen power injectable port was chosen for placement. The 8 Fr catheter was tunneled from the port pocket site to the venotomy incision. The port was placed in the pocket. External catheter was trimmed to appropriate length based on guidewire measurement. At the venotomy, an 8 Fr peel-away sheath was placed over  a guidewire. The catheter was then placed through the sheath and the sheath removed. Final catheter positioning was confirmed and documented with a fluoroscopic spot image. The port was accessed with a needle and aspirated and flushed with heparinized saline. The access needle was removed. The venotomy and port pocket incisions were closed with subcutaneous 3-0 Monocryl and subcuticular 4-0 Vicryl. Dermabond was applied to both incisions. COMPLICATIONS: COMPLICATIONS None FINDINGS: After catheter placement, the tip lies at the cavo-atrial junction. The catheter aspirates normally and is ready for immediate use. IMPRESSION: Placement of single lumen port a cath via right internal jugular vein. The catheter tip lies at the cavo-atrial junction. A power injectable port a cath was placed and is ready for immediate use. Electronically Signed   By: Aletta Edouard M.D.   On: 12/12/2020 14:07   US Thoracentesis Asp Pleural space w/IMG guide  Result Date: 12/30/2020 INDICATION: Patient with history of non-small  cell lung cancer with recurrent right pleural effusion. Request for therapeutic thoracentesis EXAM: ULTRASOUND GUIDED THERAPEUTIC THORACENTESIS MEDICATIONS: 10 mL 1% lidocaine COMPLICATIONS: None immediate. PROCEDURE: An ultrasound guided thoracentesis was thoroughly discussed with the patient and questions answered. The benefits, risks, alternatives and complications were also discussed. The patient understands and wishes to proceed with the procedure. Written consent was obtained. Ultrasound was performed to localize and mark an adequate pocket of fluid in the right chest. The area was then prepped and draped in the normal sterile fashion. 1% Lidocaine was used for local anesthesia. Under ultrasound guidance a 6 Fr Safe-T-Centesis catheter was introduced. Thoracentesis was performed. The catheter was removed and a dressing applied. FINDINGS: A total of approximately 1.8 L of clear, amber fluid was removed. IMPRESSION: Successful ultrasound guided right thoracentesis yielding 1.8 L of pleural fluid. Electronically Signed   By: Albin Felling M.D.   On: 12/30/2020 10:47   US Thoracentesis Asp Pleural space w/IMG guide  Result Date: 12/09/2020 INDICATION: Patient with history of non-small cell lung cancer with recurrent right pleural effusion. Request for therapeutic thoracentesis. EXAM: ULTRASOUND GUIDED RIGHT THORACENTESIS MEDICATIONS: 1% plain lidocaine, 5 mL COMPLICATIONS: None immediate. PROCEDURE: An ultrasound guided thoracentesis was thoroughly discussed with the patient and questions answered. The benefits, risks, alternatives and complications were also discussed. The patient understands and wishes to proceed with the procedure. Written consent was obtained. Ultrasound was performed to localize and mark an adequate pocket of fluid in the right chest. The area was then prepped and draped in the normal sterile fashion. 1% Lidocaine was used for local anesthesia. Under ultrasound guidance a 6 Fr  Safe-T-Centesis catheter was introduced. Thoracentesis was performed. The catheter was removed and a dressing applied. FINDINGS: A total of approximately 2.3 L of clear, dark yellow fluid was removed. IMPRESSION: Successful ultrasound guided right thoracentesis yielding 2.3 L of pleural fluid. Read by: Ascencion Dike PA-C Electronically Signed   By: Aletta Edouard M.D.   On: 12/09/2020 16:29     ASSESSMENT/PLAN:  This is a very pleasant 70 year old Caucasian male diagnosed with a stage IV (T3, N2, M1 a) non-small cell lung cancer, adenocarcinoma presented with large right upper lobe lung mass with extension to the right suprahilar region as well as right hilar and mediastinal lymphadenopathy with obstruction of the right upper lobe and bilateral pulmonary nodules diagnosed in July 2021. Negative PDL1 expression. He has no actionable mutations   The patient completed palliative radiotherapy to the obstructive lung mass under the care of Dr. Lisbeth Renshaw. The last treatment was in  August 2021   The patient underwent systemic chemotherapy with carboplatin for AUC of 5, Alimta 500 mg/M2, and Keytruda 200 mg IV every 3 weeks status post 9 cycles.  Starting from cycle #5 he was on maintenance treatment with Alimta and Keytruda every 3 weeks. This treatment was discontinued secondary to disease progression.   The patient underwent palliative second line systemic chemotherapy with docetaxel 75 mg/M2 and Cyramza 10 mg/KG IV every 3 weeks with Neulasta support.  Starting on May 18, 2020.  Status post 2 cycles. The patient has a rough time tolerating this treatment. He had evidence of disease progression with multiple bilateral pulmonary nodules but stable disease in the dominant mass and right pleural effusion.   His treatment was discontinued and he is currently undergoing palliative systemic chemotherapy with single agent gemcitabine 1000 MG/M2 IV on days 1 and 8 every 3 weeks. He is status 9 cycles. His dose was  reduced to 800 mg/m2.   He is here for day 8 cycle 9  It appears the patient has a stage II pressure ulcer. No leukocytosis. No fevers, swelling, or warmth. No purulent discharge. The area does not appear to be infected. Advised to keep the area clean and dry. Discussed repositioning every 2 hours. Avoid friction and advised to use a bandage to cover the area. Also encouraged to sit on a cushion. Discussed signs of infection and advised to call if he develops signs of infection 9(I.e pain, redness, swelling, pus, odor, and warmth). I will refer him to the wound care clinic to ensure that this gets managed appropriately to avoid worsening.   He was given a handout on pressure ulcer education today.   I will send a scheduling message to adjust his schedule. We should see him back on 01/18/21 for day 1 cycle #10.   The patient was advised to call immediately if he has any concerning symptoms in the interval. The patient voices understanding of current disease status and treatment options and is in agreement with the current care plan. All questions were answered. The patient knows to call the clinic with any problems, questions or concerns. We can certainly see the patient much sooner if necessary     No orders of the defined types were placed in this encounter.    The total time spent in the appointment was 20-29 minutes.   Annaleia Pence L Lorece Keach, PA-C 01/04/21

## 2021-01-04 NOTE — Progress Notes (Signed)
Ok to proceed with treatment today with open wound on right buttox per Dr. Julien Nordmann. Patient was evaluated in person by [facility]ilingo[facility] PA-C.

## 2021-01-09 NOTE — Progress Notes (Signed)
[facility] OFFICE PROGRESS NOTE  Copland, Gay Filler, MD [address] [place] [address]  DIAGNOSIS: DIAGNOSIS: Stage IV (T3, N2, M1a) non-small cell lung cancer, adenocarcinoma presented with large right upper lobe lung mass extending to the right suprahilar region in addition to right hilar and mediastinal lymphadenopathy with obstruction of the associated upper lobe bronchus and bilateral subcentimeter pulmonary nodules diagnosed in July 2021.   Biomarker Findings Microsatellite status - MS-Stable Tumor Mutational Burden - 3 Muts/Mb Genomic Findings For a complete list of the genes assayed, please refer to the Appendix. ERBB2 [id number], amplification - equivocal? HGF amplification - equivocal? MTAP loss exons 5-8 MYC amplification - equivocal? CDKN2A/B CDKN2B loss, CDKN2A loss CUL3 Y51f*19 RPTOR amplification TP53 [id number] 7 Disease relevant genes with no reportable alterations: ALK, BRAF, EGFR, KRAS, MET, RET, ROS1   PDL1 Expression: Negative  PRIOR THERAPY: 1) Palliative radiotherapy to the obstructive lung mass under the care of Dr. MoLisbeth RenshawThe last treatment was on 11/23/19 2) Systemic chemotherapy with carboplatin for AUC of 5, Alimta 500 mg/M2 and Keytruda 200 mg IV every 3 weeks.  First dose November 04, 2019.  Status post 9 cycles.  Starting from cycle #5 he was on maintenance treatment with Alimta and Keytruda every 3 weeks. 3) Second line systemic chemotherapy with docetaxel 75 mg/M2 and Cyramza 10 mg/KG every weeks with Neulasta support.  First dose May 18, 2020.  Status post 2 cycles.  Last dose was given June 17, 2020 discontinued secondary to disease progression.  CURRENT THERAPY: Third line systemic chemotherapy with single agent gemcitabine 1000 mg/M2 on days 1 and 8 every 3 weeks.  First dose July 13, 2020. Status post 9 cycles. His dose was reduced to 800 mg/m2.    INTERVAL HISTORY: WiSava [id number]o. male  returns to the clinic today for a follow up visit. The patient is feeling fair today except for increased shortness of breath with exertion, diminished appetite, and continued fatigue.  At his last appointment on 12/28/2020, the patient was having suggestive symptoms of a recurrent pleural effusion.  He underwent a thoracentesis which obtained 1.8 L of fluid.  Since that time had improved but now he feels that his fluid has re-accumulated due to the shortness of breath with exertion. He has a baseline dry cough which is stable. He has hycodan if needed and states he has over 1/2 the bottle left. He also has occasional wheezing for which he has albuterol but hasn't been using it.  He denies any chest pain or hemoptysis.    He was also seen in the interval in the infusion room due to a sacral pressure ulcer.  He is referred to wound care and saw them on Tuesday. He has another follow up in 2 weeks. He is expecting to receive a shipment of wound care supplies today. He is trying to be diligent with repositioning.  The patient is currently on a reduced dose of Gemzar due to neutropenia.  He continues to have fatigue and weakness. He denies any fever or night sweats. He notes he is always cold.  His weight is relatively stable although he has a poor appetite and is thin. he has intermittent nausea and vomiting which is in an unpredictable pattern.  His antiemetics are effective but he is not always able to take it before the onset of nausea and vomiting.  He denies any significant diarrhea or constipation.  He denies any headache or visual changes. His  ptosis of the right eye has good days and bad days. He was supposed to have a repeat brain MRI in the summer of 2022 to follow-upon some abnormalities seen on neuroimaging in spring 2022, but he refused repeat imaging. He was supposed to have a restaging CT scan to review at today's appointment but this was delayed until 01/24/21 due to issues with insurance  authorization.  He is here for evaluation and to review his scan results before starting cycle #10.      MEDICAL HISTORY: Past Medical History:  Diagnosis Date   Chicken pox    DM2 (diabetes mellitus, type 2) ([place])    History of kidney stones    passed several    Hypertension    Kidney stones    5-6 times   Measles    Mumps    Stroke ([facility])    x 2, last one was 09/18/2019- has some expressive aphasia    ALLERGIES:  is allergic to bee venom.  MEDICATIONS:  Current Outpatient Medications  Medication Sig Dispense Refill   albuterol (VENTOLIN HFA) 108 (90 Base) MCG/ACT inhaler Inhale 2 puffs into the lungs every 6 (six) hours as needed for wheezing or shortness of breath. 8 g 2   apixaban (ELIQUIS) 5 MG TABS tablet Take 1 tablet (5 mg total) by mouth 2 (two) times daily. 60 tablet 11   atorvastatin (LIPITOR) 40 MG tablet TAKE ONE TABLET BY MOUTH ONE TIME DAILY 6PM 90 tablet 3   diltiazem (CARDIZEM CD) 180 MG 24 hr capsule Take 1 capsule (180 mg total) by mouth daily. 30 capsule 6   diphenhydrAMINE HCl, Sleep, (ZZZQUIL) 25 MG CAPS Take 25 mg by mouth at bedtime.     HYDROcodone bit-homatropine (HYCODAN) 5-1.5 MG/5ML syrup Take 5 mLs by mouth every 6 (six) hours as needed for cough. 473 mL 0   lidocaine-prilocaine (EMLA) cream Apply to the Port-A-Cath site 30-60-minute before treatment 30 g 0   metFORMIN (GLUCOPHAGE) 500 MG tablet TAKE TWO TABLETS BY MOUTH TWICE A DAY WITH MEAL(S) 400 tablet 3   methylPREDNISolone (MEDROL DOSEPAK) 4 MG TBPK tablet Use as instructed. 21 tablet 0   Omega-3 Fatty Acids (FISH OIL) 1200 MG CAPS Take 1,200 mg by mouth daily.     prochlorperazine (COMPAZINE) 10 MG tablet Take 1 tablet (10 mg total) by mouth every 6 (six) hours as needed. 30 tablet 2   No current facility-administered medications for this visit.    SURGICAL HISTORY:  Past Surgical History:  Procedure Laterality Date   COLONOSCOPY W/ POLYPECTOMY     IR IMAGING GUIDED PORT INSERTION   12/12/2020   VIDEO BRONCHOSCOPY WITH ENDOBRONCHIAL ULTRASOUND N/A 09/30/2019   Procedure: VIDEO BRONCHOSCOPY WITH ENDOBRONCHIAL ULTRASOUND;  Surgeon: Collene Gobble, MD;  Location: [facility];  Service: Thoracic;  Laterality: N/A;   WISDOM TOOTH EXTRACTION  1976-77    REVIEW OF SYSTEMS:   Review of Systems  Constitutional: Positive for fatigue, decreased appetite, cold intolerance. Negative for chills, fever and unexpected weight change.  HENT: Negative for mouth sores, nosebleeds, sore throat and trouble swallowing.   Eyes: Positive for ptosis of right eye. Negative for eye problems and icterus.  Respiratory: Positive for dyspnea on exertion and cough.  Negative for hemoptysis. Positive for occasional wheezing.  Cardiovascular: Negative for chest pain and leg swelling.  Gastrointestinal: Positive for intermittent nausea and vomiting.  Negative for abdominal pain, constipation, and diarrhea.  Genitourinary: Negative for bladder incontinence, difficulty urinating, dysuria, frequency and hematuria.  Musculoskeletal: Negative for back pain, gait problem, neck pain and neck stiffness.  Skin: Negative for itching and rash.  Neurological: Negative for dizziness, extremity weakness, gait problem, headaches, light-headedness and seizures.  Hematological: Negative for adenopathy. Does not bruise/bleed easily.  Psychiatric/Behavioral: Negative for confusion, depression and sleep disturbance. The patient is not nervous/anxious.     PHYSICAL EXAMINATION:  Blood pressure 115/68, pulse 91, temperature (!) 97.1 F (36.2 C), temperature source Tympanic, resp. rate 18, weight 164 lb 1.6 oz (74.4 kg), SpO2 92 %.  ECOG PERFORMANCE STATUS: 1  Physical Exam  Constitutional: Oriented to person, place, and time and thin appearing male and in no distress. HENT: Head: Normocephalic and atraumatic. Mouth/Throat: Oropharynx is clear and moist. No oropharyngeal exudate. Eyes: Right eye ptosis. Conjunctivae are  normal. Right eye exhibits no discharge. Left eye exhibits no discharge. No scleral icterus. Neck: Normal range of motion. Neck supple. Cardiovascular: Normal rate, regular rhythm, normal heart sounds and intact distal pulses.   Pulmonary/Chest: Effort normal. Decreased breath sounds majority of right lung. No respiratory distress. No wheezes. No rales. Dullness to percussion in right lung.  Abdominal: Soft. Bowel sounds are normal. Exhibits no distension and no mass. There is no tenderness.  Musculoskeletal: Normal range of motion. Exhibits no edema.  Lymphadenopathy:    No cervical adenopathy.  Neurological: Alert and oriented to person, place, and time. Exhibits muscle wasting. Gait normal. Coordination normal. Skin: Skin is warm and dry. No rash noted. Not diaphoretic. No erythema. No pallor.  Psychiatric: Mood, memory and judgment normal. Vitals reviewed.    LABORATORY DATA: Lab Results  Component Value Date   WBC 4.7 01/18/2021   HGB 9.6 (L) 01/18/2021   HCT 28.6 (L) 01/18/2021   MCV 105.9 (H) 01/18/2021   PLT 266 01/18/2021      Chemistry      Component Value Date/Time   NA 139 01/18/2021 1401   K 4.1 01/18/2021 1401   CL 102 01/18/2021 1401   CO2 25 01/18/2021 1401   BUN 24 (H) 01/18/2021 1401   CREATININE 1.17 01/18/2021 1401   CREATININE 1.13 10/16/2013 1118      Component Value Date/Time   CALCIUM 9.1 01/18/2021 1401   ALKPHOS 88 01/18/2021 1401   AST 28 01/18/2021 1401   ALT 52 (H) 01/18/2021 1401   BILITOT 0.7 01/18/2021 1401       RADIOGRAPHIC STUDIES:  DG Chest 1 View  Result Date: 12/30/2020 CLINICAL DATA:  Post thoracentesis EXAM: CHEST  1 VIEW COMPARISON:  12/09/2020 FINDINGS: Persistent moderate right pleural effusion and associated right lung atelectasis. Appearance is similar to prior study. No pneumothorax. Increased small left pleural effusion and left basilar atelectasis. Heart size. IMPRESSION: No pneumothorax. Persistent moderate right  pleural effusion and associated right lung atelectasis. Increased small left pleural effusion and left basilar atelectasis. Electronically Signed   By: Macy Mis M.D.   On: 12/30/2020 10:31   US Thoracentesis Asp Pleural space w/IMG guide  Result Date: 12/30/2020 INDICATION: Patient with history of non-small cell lung cancer with recurrent right pleural effusion. Request for therapeutic thoracentesis EXAM: ULTRASOUND GUIDED THERAPEUTIC THORACENTESIS MEDICATIONS: 10 mL 1% lidocaine COMPLICATIONS: None immediate. PROCEDURE: An ultrasound guided thoracentesis was thoroughly discussed with the patient and questions answered. The benefits, risks, alternatives and complications were also discussed. The patient understands and wishes to proceed with the procedure. Written consent was obtained. Ultrasound was performed to localize and mark an adequate pocket of fluid in the right chest. The area was then  prepped and draped in the normal sterile fashion. 1% Lidocaine was used for local anesthesia. Under ultrasound guidance a 6 Fr Safe-T-Centesis catheter was introduced. Thoracentesis was performed. The catheter was removed and a dressing applied. FINDINGS: A total of approximately 1.8 L of clear, amber fluid was removed. IMPRESSION: Successful ultrasound guided right thoracentesis yielding 1.8 L of pleural fluid. Electronically Signed   By: Albin Felling M.D.   On: 12/30/2020 10:47     ASSESSMENT/PLAN:  This is a very pleasant 70 year old Caucasian male diagnosed with a stage IV (T3, N2, M1 a) non-small cell lung cancer, adenocarcinoma presented with large right upper lobe lung mass with extension to the right suprahilar region as well as right hilar and mediastinal lymphadenopathy with obstruction of the right upper lobe and bilateral pulmonary nodules diagnosed in July 2021. Negative PDL1 expression. He has no actionable mutations   The patient completed palliative radiotherapy to the obstructive lung mass  under the care of Dr. Lisbeth Renshaw. The last treatment was in August 2021   The patient underwent systemic chemotherapy with carboplatin for AUC of 5, Alimta 500 mg/M2, and Keytruda 200 mg IV every 3 weeks status post 9 cycles.  Starting from cycle #5 he was on maintenance treatment with Alimta and Keytruda every 3 weeks. This treatment was discontinued secondary to disease progression.  The patient underwent palliative second line systemic chemotherapy with docetaxel 75 mg/M2 and Cyramza 10 mg/KG IV every 3 weeks with Neulasta support.  Starting on May 18, 2020.  Status post 2 cycles. The patient has a rough time tolerating this treatment. He had evidence of disease progression with multiple bilateral pulmonary nodules but stable disease in the dominant mass and right pleural effusion.   His treatment was discontinued and he is currently undergoing palliative systemic chemotherapy with single agent gemcitabine 1000 MG/M2 IV on days 1 and 8 every 3 weeks. He is status 9 cycles. His dose was reduced to 800 mg/m2.    He was supposed to have a restaging CT scan to review at his appointment today but due to issues with insurance authorization, this is scheduled now for 01/24/21.   Labs were reviewed. Recommend that he proceed with cycle #10 today as scheduled.   I will arrange for an ultrasound guided thoracentesis for his right pleural effusion. Advised to use albuterol for wheezing. He has hycodan if needed for cough.   We will see him back for follow-up visit in 3 weeks for evaluation before starting cycle number 11.  The patient knows to call sooner if he feels like his pleural effusion is reaccumulating.  He has a prescription for Hycodan if needed for his cough.  He will continue to follow with wound care for his pressure ulcer.   The patient was advised to call immediately if he has any concerning symptoms in the interval. The patient voices understanding of current disease status and treatment  options and is in agreement with the current care plan. All questions were answered. The patient knows to call the clinic with any problems, questions or concerns. We can certainly see the patient much sooner if necessary            Orders Placed This Encounter  Procedures   US Thoracentesis Asp Pleural space w/IMG guide    Standing Status:   Future    Standing Expiration Date:   01/18/2022    Order Specific Question:   Are labs required for specimen collection?    Answer:  No    Order Specific Question:   Reason for Exam (SYMPTOM  OR DIAGNOSIS REQUIRED)    Answer:   Right pleural effusion, lung cancer.    Order Specific Question:   Preferred imaging location?    Answer:   [facility]     The total time spent in the appointment was 30-39 minutes.   Jaydin Jalomo L Rosamund Nyland, PA-C 01/18/21

## 2021-01-16 ENCOUNTER — Encounter (HOSPITAL_BASED_OUTPATIENT_CLINIC_OR_DEPARTMENT_OTHER): Payer: Medicare HMO | Attending: Internal Medicine | Admitting: Internal Medicine

## 2021-01-16 ENCOUNTER — Other Ambulatory Visit: Payer: Self-pay

## 2021-01-16 DIAGNOSIS — Z91030 Bee allergy status: Secondary | ICD-10-CM | POA: Insufficient documentation

## 2021-01-16 DIAGNOSIS — R634 Abnormal weight loss: Secondary | ICD-10-CM | POA: Diagnosis not present

## 2021-01-16 DIAGNOSIS — C3411 Malignant neoplasm of upper lobe, right bronchus or lung: Secondary | ICD-10-CM | POA: Insufficient documentation

## 2021-01-16 DIAGNOSIS — L89312 Pressure ulcer of right buttock, stage 2: Secondary | ICD-10-CM | POA: Diagnosis not present

## 2021-01-16 DIAGNOSIS — E119 Type 2 diabetes mellitus without complications: Secondary | ICD-10-CM | POA: Insufficient documentation

## 2021-01-16 DIAGNOSIS — Z923 Personal history of irradiation: Secondary | ICD-10-CM | POA: Insufficient documentation

## 2021-01-16 DIAGNOSIS — Z9221 Personal history of antineoplastic chemotherapy: Secondary | ICD-10-CM | POA: Insufficient documentation

## 2021-01-16 DIAGNOSIS — Z85118 Personal history of other malignant neoplasm of bronchus and lung: Secondary | ICD-10-CM | POA: Diagnosis not present

## 2021-01-16 DIAGNOSIS — I10 Essential (primary) hypertension: Secondary | ICD-10-CM | POA: Diagnosis not present

## 2021-01-16 NOTE — Progress Notes (Signed)
Micheal Oliver ([id number]) Visit Report for 01/16/2021 Debridement Details Patient Name: Date of Service: Micheal Oliver, Micheal Oliver 01/16/2021 1:15 PM Medical Record Number: [id number] Patient Account Number: [id number] Date of Birth/Sex: Treating RN: 11/06/1950 (70 y.o. Male) Baruch Gouty Primary Care Provider: Lamar Blinks Other Clinician: Referring Provider: Treating Provider/Extender: [place] Schimke, [place] S[place] Weeks in Treatment: 0 Debridement Performed for Assessment: Wound #1 Right Gluteus Performed By: Clinician Baruch Gouty, RN Debridement Type: Chemical/Enzymatic/Mechanical Agent Used: wound cleanser and gauze Level of Consciousness (Pre-procedure): Awake and Alert Pre-procedure Verification/Time Out No Taken: Bleeding: None Response to Treatment: Procedure was tolerated well Level of Consciousness (Post- Awake and Alert procedure): Post Debridement Measurements of Total Wound Length: (cm) 0.8 Stage: Category/Stage II Width: (cm) 1 Depth: (cm) 0.1 Volume: (cm) 0.063 Character of Wound/Ulcer Post Debridement: Stable Post Procedure Diagnosis Same as Pre-procedure Electronic Signature(s) Signed: 01/16/2021 5:32:36 PM By: Linton Ham MD Signed: 01/16/2021 5:48:05 PM By: Baruch Gouty RN, BSN Entered By: Baruch Gouty on 01/16/2021 14:13:26 -------------------------------------------------------------------------------- HPI Details Patient Name: Date of Service: Micheal Oliver, Micheal Oliver 01/16/2021 1:15 PM Medical Record Number: [id number] Patient Account Number: [id number] Date of Birth/Sex: Treating RN: December 27, 1950 (70 y.o. Male) Lorrin Jackson Primary Care Provider: Lamar Blinks Other Clinician: Referring Provider: Treating Provider/Extender: [place] Schimke, [place] SSA [employer] Weeks in Treatment: 0 History of Present Illness HPI Description: ADMISSION 01/16/2021 This is an unfortunate 70 year old man who has stage IV  adenocarcinoma of the right upper lobe of the lung. He received radiation and is now on his third combination of keynote chemotherapy. Apparently has had limited respone to the prior to rounds of chemotherapy. As result of this he has lost 60 pounds of weight he has a marginal intake. He started to become aware of pain in his buttock area on the right several weeks ago. He asked his oncologist to look at it and they correctly call this is stage II pressure ulcer on the right buttock and referred him here. He has been using Vaseline gauze. He says the area is very painful however it is really quite small in terms of surface area Past medical history includes type 2 diabetes, hypertension, CVA 2 years ago, right upper lobe adeno CA of the lung stage IV Electronic Signature(s) Signed: 01/16/2021 5:32:36 PM By: Linton Ham MD Entered By: Linton Ham on 01/16/2021 14:03:37 -------------------------------------------------------------------------------- Physical Exam Details Patient Name: Date of Service: Micheal Oliver, Micheal Oliver 01/16/2021 1:15 PM Medical Record Number: [id number] Patient Account Number: [id number] Date of Birth/Sex: Treating RN: 03-Dec-1950 (70 y.o. Male) Lorrin Jackson Primary Care Provider: Lamar Blinks Other Clinician: Referring Provider: Treating Provider/Extender: [place] Schimke, [place] S[employer] Weeks in Treatment: 0 Constitutional Sitting or standing Blood Pressure is within target range for patient.. Pulse regular and within target range for patient.Marland Kitchen Respirations regular, non-labored and within target range.. Temperature is normal and within the target range for the patient.Marland Kitchen Appears in no distress. Respiratory work of breathing is normal. Gastrointestinal (GI) Abdomen is soft and non-distended without masses or tenderness.. Notes Wound exam; the area is on the right buttock in close proximity to the gluteal cleft posteriorly. Superficial wound small.  There is no surrounding erythema this does not look aggravated or infected. Electronic Signature(s) Signed: 01/16/2021 5:32:36 PM By: Linton Ham MD Entered By: Linton Ham on 01/16/2021 14:09:59 -------------------------------------------------------------------------------- Physician Orders Details Patient Name: Date of Service: Micheal Oliver, Micheal Oliver 01/16/2021 1:15 PM Medical Record Number: [id number] Patient Account Number: [id number] Date of Birth/Sex: Treating RN: [birth year] (70 y.o. Male)  Baruch Gouty Primary Care Provider: Lamar Blinks Other Clinician: Referring Provider: Treating Provider/Extender: [place] Schimke, [place] SSA [place] Weeks in Treatment: 0 Verbal / Phone Orders: No Diagnosis Coding Follow-up Appointments Return Appointment in 2 weeks. Bathing/ Shower/ Hygiene May shower and wash wound with soap and water. - with dressing changes Off-Loading Turn and reposition every 2 hours - avoid sleeping in chair, Additional Orders / Instructions Follow Nutritious Diet - 1-2 Ensure per day Wound Treatment Wound #1 - Gluteus Wound Laterality: Right Cleanser: Normal Saline (DME) (Generic) Every Other Day/30 Days Discharge Instructions: Cleanse the wound with Normal Saline prior to applying a clean dressing using gauze sponges, not tissue or cotton balls. Cleanser: Soap and Water Every Other Day/30 Days Discharge Instructions: May shower and wash wound with dial antibacterial soap and water prior to dressing change. Prim Dressing: Promogran Prisma Matrix, 4.34 (sq in) (silver collagen) (DME) (Generic) Every Other Day/30 Days ary Discharge Instructions: Moisten collagen with saline or hydrogel Secondary Dressing: Zetuvit Plus Silicone Border Dressing 4x4 (in/in) (DME) (Generic) Every Other Day/30 Days Discharge Instructions: Apply silicone border over primary dressing as directed. Electronic Signature(s) Signed: 01/16/2021 5:32:36 PM By: Linton Ham  MD Signed: 01/16/2021 5:48:05 PM By: Baruch Gouty RN, BSN Entered By: Baruch Gouty on 01/16/2021 14:15:18 -------------------------------------------------------------------------------- Problem List Details Patient Name: Date of Service: Micheal Oliver, Micheal Oliver 01/16/2021 1:15 PM Medical Record Number: [id number] Patient Account Number: [id number] Date of Birth/Sex: Treating RN: 04-11-1950 (70 y.o. Male) Lorrin Jackson Primary Care Provider: Lamar Blinks Other Clinician: Referring Provider: Treating Provider/Extender: [place] Schimke, [place] SSA [place] Weeks in Treatment: 0 Active Problems ICD-10 Encounter Code Description Active Date MDM Diagnosis L89.312 Pressure ulcer of right buttock, stage 2 01/16/2021 No Yes R63.4 Abnormal weight loss 01/16/2021 No Yes C34.11 Malignant neoplasm of upper lobe, right bronchus or lung 01/16/2021 No Yes Inactive Problems Resolved Problems Electronic Signature(s) Signed: 01/16/2021 5:32:36 PM By: Linton Ham MD Entered By: Linton Ham on 01/16/2021 14:00:39 -------------------------------------------------------------------------------- Progress Note Details Patient Name: Date of Service: Micheal Oliver 01/16/2021 1:15 PM Medical Record Number: [id number] Patient Account Number: [id number] Date of Birth/Sex: Treating RN: 1950/07/02 (70 y.o. Male) Lorrin Jackson Primary Care Provider: Other Clinician: Lamar Blinks Referring Provider: Treating Provider/Extender: [place] Schimke, [place] S[employer] Weeks in Treatment: 0 Subjective History of Present Illness (HPI) ADMISSION 01/16/2021 This is an unfortunate 70 year old man who has stage IV adenocarcinoma of the right upper lobe of the lung. He received radiation and is now on his third combination of keynote chemotherapy. Apparently has had limited respone to the prior to rounds of chemotherapy. As result of this he has lost 60 pounds of weight he has  a marginal intake. He started to become aware of pain in his buttock area on the right several weeks ago. He asked his oncologist to look at it and they correctly call this is stage II pressure ulcer on the right buttock and referred him here. He has been using Vaseline gauze. He says the area is very painful however it is really quite small in terms of surface area Past medical history includes type 2 diabetes, hypertension, CVA 2 years ago, right upper lobe adeno [place] of the lung stage IV Patient History Information obtained from Patient, Chart. Allergies bee venom protein (honey bee) Family History Cancer - Mother, Heart Disease - Father, Hypertension - Father, Stroke - Father, No family history of Diabetes, Hereditary Spherocytosis, Kidney Disease, Lung Disease, Seizures, Thyroid Problems, Tuberculosis. Social History Never smoker, Marital  Status - Married, Alcohol Use - Never, Drug Use - No History, Caffeine Use - Daily. Medical History Cardiovascular Patient has history of Arrhythmia, Hypertension Endocrine Patient has history of Type II Diabetes Denies history of Type I Diabetes Genitourinary Denies history of End Stage Renal Disease Integumentary (Skin) Denies history of History of Burn Oncologic Patient has history of Received Chemotherapy, Received Radiation Psychiatric Patient has history of Anorexia/bulimia - poor appetite Denies history of Confinement Anxiety Patient is treated with Oral Agents. Blood sugar is not tested. Hospitalization/Surgery History - porta cath placement. Review of Systems (ROS) Constitutional Symptoms (General Health) Complains or has symptoms of Fatigue. Eyes Complains or has symptoms of Glasses / Contacts. Denies complaints or symptoms of Dry Eyes, Vision Changes. Ear/Nose/Mouth/Throat Denies complaints or symptoms of Chronic sinus problems or rhinitis. Respiratory Complains or has symptoms of Shortness of Breath. Denies complaints or  symptoms of Chronic or frequent coughs. Cardiovascular Denies complaints or symptoms of Chest pain. Gastrointestinal Denies complaints or symptoms of Frequent diarrhea, Nausea, Vomiting. Endocrine Denies complaints or symptoms of Heat/cold intolerance. Genitourinary Denies complaints or symptoms of Frequent urination. Integumentary (Skin) Complains or has symptoms of Wounds - coccyx. Musculoskeletal Denies complaints or symptoms of Muscle Pain, Muscle Weakness. Neurologic Complains or has symptoms of Numbness/parasthesias - fingers intermittantly. Psychiatric Denies complaints or symptoms of Claustrophobia, Suicidal. Objective Constitutional Sitting or standing Blood Pressure is within target range for patient.. Pulse regular and within target range for patient.Marland Kitchen Respirations regular, non-labored and within target range.. Temperature is normal and within the target range for the patient.Marland Kitchen Appears in no distress. Vitals Time Taken: 1:13 PM, Height: 74 in, Source: Stated, Weight: 165 lbs, Source: Stated, BMI: 21.2, Temperature: 98.4 F, Pulse: 82 bpm, Respiratory Rate: 18 breaths/min, Blood Pressure: 127/83 mmHg. Respiratory work of breathing is normal. Gastrointestinal (GI) Abdomen is soft and non-distended without masses or tenderness.. General Notes: Wound exam; the area is on the right buttock in close proximity to the gluteal cleft posteriorly. Superficial wound small. There is no surrounding erythema this does not look aggravated or infected. Integumentary (Hair, Skin) Wound #1 status is Open. Original cause of wound was Pressure Injury. The date acquired was: 12/15/2020. The wound is located on the Right Gluteus. The wound measures 0.8cm length x 1cm width x 0.1cm depth; 0.628cm^2 area and 0.063cm^3 volume. The wound is limited to skin breakdown. There is no tunneling or undermining noted. There is a small amount of serous drainage noted. The wound margin is distinct with the  outline attached to the wound base. There is large (67-100%) pink granulation within the wound bed. There is no necrotic tissue within the wound bed. Assessment Active Problems ICD-10 Pressure ulcer of right buttock, stage 2 Abnormal weight loss Malignant neoplasm of upper lobe, right bronchus or lung Plan Follow-up Appointments: Return Appointment in 2 weeks. Bathing/ Shower/ Hygiene: May shower and wash wound with soap and water. - with dressing changes Off-Loading: Turn and reposition every 2 hours - avoid sleeping in chair, Additional Orders / Instructions: Follow Nutritious Diet - 1-2 Ensure per day WOUND #1: - Gluteus Wound Laterality: Right Prim Dressing: Promogran Prisma Matrix, 4.34 (sq in) (silver collagen) Every Other Day/30 Days ary Discharge Instructions: Moisten collagen with saline or hydrogel Secondary Dressing: Zetuvit Plus Silicone Border Dressing 4x4 (in/in) Every Other Day/30 Days Discharge Instructions: Apply silicone border over primary dressing as directed. 1. Superficial wound stage II. This does not look infected we are going to use silver collagen with border foam change q2d.  2 we have asked him to avoid sleeping in a recliner chair 3. Suggested Ensure 1 can twice a day 4. He asked me why this is so painful which is difficult to explain. Certainly wounds in this area are uncomfortable but in particular there is no evidence of surrounding infection there is no suggestion that this is an inflammatory ulcer in any way mediated by his chemotherapy regimen Follow-up in 2 weeks Electronic Signature(s) Signed: 01/16/2021 5:32:36 PM By: Linton Ham MD Entered By: Linton Ham on 01/16/2021 14:13:21 -------------------------------------------------------------------------------- HxROS Details Patient Name: Date of Service: Micheal Oliver 01/16/2021 1:15 PM Medical Record Number: [id number] Patient Account Number: [id number] Date of Birth/Sex: Treating  RN: 1950-10-18 (70 y.o. Male) Baruch Gouty Primary Care Provider: Lamar Blinks Other Clinician: Referring Provider: Treating Provider/Extender: [place] Schimke, [place] SSA [employer] Weeks in Treatment: 0 Information Obtained From Patient Chart Constitutional Symptoms (General Health) Complaints and Symptoms: Positive for: Fatigue Eyes Complaints and Symptoms: Positive for: Glasses / Contacts Negative for: Dry Eyes; Vision Changes Ear/Nose/Mouth/Throat Complaints and Symptoms: Negative for: Chronic sinus problems or rhinitis Respiratory Complaints and Symptoms: Positive for: Shortness of Breath Negative for: Chronic or frequent coughs Cardiovascular Complaints and Symptoms: Negative for: Chest pain Medical History: Positive for: Arrhythmia; Hypertension Gastrointestinal Complaints and Symptoms: Negative for: Frequent diarrhea; Nausea; Vomiting Endocrine Complaints and Symptoms: Negative for: Heat/cold intolerance Medical History: Positive for: Type II Diabetes Negative for: Type I Diabetes Time with diabetes: 3-4 years Treated with: Oral agents Blood sugar tested every day: No Genitourinary Complaints and Symptoms: Negative for: Frequent urination Medical History: Negative for: End Stage Renal Disease Integumentary (Skin) Complaints and Symptoms: Positive for: Wounds - coccyx Medical History: Negative for: History of Burn Musculoskeletal Complaints and Symptoms: Negative for: Muscle Pain; Muscle Weakness Neurologic Complaints and Symptoms: Positive for: Numbness/parasthesias - fingers intermittantly Psychiatric Complaints and Symptoms: Negative for: Claustrophobia; Suicidal Medical History: Positive for: Anorexia/bulimia - poor appetite Negative for: Confinement Anxiety Hematologic/Lymphatic Immunological Oncologic Medical History: Positive for: Received Chemotherapy; Received Radiation Immunizations Pneumococcal Vaccine: Received  Pneumococcal Vaccination: No Implantable Devices Yes Hospitalization / Surgery History Type of Hospitalization/Surgery porta cath placement Family and Social History Cancer: Yes - Mother; Diabetes: No; Heart Disease: Yes - Father; Hereditary Spherocytosis: No; Hypertension: Yes - Father; Kidney Disease: No; Lung Disease: No; Seizures: No; Stroke: Yes - Father; Thyroid Problems: No; Tuberculosis: No; Never smoker; Marital Status - Married; Alcohol Use: Never; Drug Use: No History; Caffeine Use: Daily; Financial Concerns: No; Food, Clothing or Shelter Needs: No; Support System Lacking: No; Transportation Concerns: No Engineer, maintenance) Signed: 01/16/2021 5:32:36 PM By: Linton Ham MD Signed: 01/16/2021 5:48:05 PM By: Baruch Gouty RN, BSN Entered By: Baruch Gouty on 01/16/2021 13:22:34 -------------------------------------------------------------------------------- SuperBill Details Patient Name: Date of Service: Micheal Oliver, Micheal Oliver 01/16/2021 Medical Record Number: [id number] Patient Account Number: [id number] Date of Birth/Sex: Treating RN: 12-17-1950 (70 y.o. Male) Baruch Gouty Primary Care Provider: Lamar Blinks Other Clinician: Referring Provider: Treating Provider/Extender: [place] Schimke, [place] SSA [place] Weeks in Treatment: 0 Diagnosis Coding ICD-10 Codes Code Description [phone] Pressure ulcer of right buttock, stage 2 R63.4 Abnormal weight loss C34.11 Malignant neoplasm of upper lobe, right bronchus or lung Facility Procedures CPT4 Code: [id number] Description: 214 - WOUND CARE VISIT-LEV 4 EST PT Modifier: 1 Quantity: Physician Procedures : CPT4 Code Description Modifier [id number] WC PHYS LEVEL 3 NEW PT ICD-10 Diagnosis Description L89.312 Pressure ulcer of right buttock, stage 2 R63.4 Abnormal weight loss C34.11 Malignant neoplasm of upper lobe, right bronchus or  lung Quantity: 1 Electronic Signature(s) Signed: 01/16/2021 5:32:36 PM By:  Linton Ham MD Entered By: Linton Ham on 01/16/2021 14:14:15

## 2021-01-16 NOTE — Progress Notes (Signed)
Micheal Oliver, Micheal Oliver ([id number]) Visit Report for 01/16/2021 Allergy List Details Patient Name: Date of Service: Micheal Oliver, Micheal Oliver 01/16/2021 1:15 PM Medical Record Number: [id number] Patient Account Number: [id number] Date of Birth/Sex: Treating RN: 05/15/1950 (70 y.o. Male) Baruch Gouty Primary Care Jessicaann Overbaugh: Lamar Blinks Other Clinician: Referring Lititia Sen: Treating Aeon Kessner/Extender: [place], [place] SSA [place] Weeks in Treatment: 0 Allergies Active Allergies bee venom protein (honey bee) Allergy Notes Electronic Signature(s) Signed: 01/16/2021 5:48:05 PM By: Baruch Gouty RN, BSN Entered By: Baruch Gouty on 01/16/2021 13:15:33 -------------------------------------------------------------------------------- Arrival Information Details Patient Name: Date of Service: Micheal Oliver, Micheal Oliver 01/16/2021 1:15 PM Medical Record Number: [id number] Patient Account Number: [id number] Date of Birth/Sex: Treating RN: [birth year] (70 y.o. Male) Baruch Gouty Primary Care Dayami Taitt: Lamar Blinks Other Clinician: Referring Altan Kraai: Treating Caven Perine/Extender: [place], [place] SSA [place] Weeks in Treatment: 0 Visit Information Patient Arrived: Ambulatory Arrival Time: 13:08 Accompanied By: self Transfer Assistance: None Patient Identification Verified: Yes Secondary Verification Process Completed: Yes Patient Requires Transmission-Based Precautions: No Patient Has Alerts: No Electronic Signature(s) Signed: 01/16/2021 5:48:05 PM By: Baruch Gouty RN, BSN Entered By: Baruch Gouty on 01/16/2021 13:12:15 -------------------------------------------------------------------------------- Clinic Level of Care Assessment Details Patient Name: Date of Service: Micheal Oliver, Micheal Oliver 01/16/2021 1:15 PM Medical Record Number: [id number] Patient Account Number: [id number] Date of Birth/Sex: Treating RN: [birth year] (70 y.o. Male) Baruch Gouty Primary Care  Karisha Marlin: Lamar Blinks Other Clinician: Referring Kwadwo Taras: Treating Dewain Platz/Extender: [place], [place] S[place] Weeks in Treatment: 0 Clinic Level of Care Assessment Items TOOL 2 Quantity Score []  - 0 Use when only an EandM is performed on the INITIAL visit ASSESSMENTS - Nursing Assessment / Reassessment X- 1 20 General Physical Exam (combine w/ comprehensive assessment (listed just below) when performed on new pt. evals) X- 1 25 Comprehensive Assessment (HX, ROS, Risk Assessments, Wounds Hx, etc.) ASSESSMENTS - Wound and Skin A ssessment / Reassessment X - Simple Wound Assessment / Reassessment - one wound 1 5 []  - 0 Complex Wound Assessment / Reassessment - multiple wounds []  - 0 Dermatologic / Skin Assessment (not related to wound area) ASSESSMENTS - Ostomy and/or Continence Assessment and Care []  - 0 Incontinence Assessment and Management []  - 0 Ostomy Care Assessment and Management (repouching, etc.) PROCESS - Coordination of Care X - Simple Patient / Family Education for ongoing care 1 15 []  - 0 Complex (extensive) Patient / Family Education for ongoing care X- 1 10 Staff obtains Programmer, systems, Records, T Results / Process Orders est []  - 0 Staff telephones HHA, Nursing Homes / Clarify orders / etc []  - 0 Routine Transfer to another Facility (non-emergent condition) []  - 0 Routine Hospital Admission (non-emergent condition) X- 1 15 New Admissions / Biomedical engineer / Ordering NPWT Apligraf, etc. , []  - 0 Emergency Hospital Admission (emergent condition) X- 1 10 Simple Discharge Coordination []  - 0 Complex (extensive) Discharge Coordination PROCESS - Special Needs []  - 0 Pediatric / Minor Patient Management []  - 0 Isolation Patient Management []  - 0 Hearing / Language / Visual special needs []  - 0 Assessment of Community assistance (transportation, D/C planning, etc.) []  - 0 Additional assistance / Altered mentation []  -  0 Support Surface(s) Assessment (bed, cushion, seat, etc.) INTERVENTIONS - Wound Cleansing / Measurement X- 1 5 Wound Imaging (photographs - any number of wounds) []  - 0 Wound Tracing (instead of photographs) X- 1 5 Simple Wound Measurement - one wound []  - 0 Complex Wound Measurement - multiple wounds X- 1 5 Simple Wound  Cleansing - one wound []  - 0 Complex Wound Cleansing - multiple wounds INTERVENTIONS - Wound Dressings X - Small Wound Dressing one or multiple wounds 1 10 []  - 0 Medium Wound Dressing one or multiple wounds []  - 0 Large Wound Dressing one or multiple wounds []  - 0 Application of Medications - injection INTERVENTIONS - Miscellaneous []  - 0 External ear exam []  - 0 Specimen Collection (cultures, biopsies, blood, body fluids, etc.) []  - 0 Specimen(s) / Culture(s) sent or taken to Lab for analysis []  - 0 Patient Transfer (multiple staff / Civil Service fast streamer / Similar devices) []  - 0 Simple Staple / Suture removal (25 or less) []  - 0 Complex Staple / Suture removal (26 or more) []  - 0 Hypo / Hyperglycemic Management (close monitor of Blood Glucose) []  - 0 Ankle / Brachial Index (ABI) - do not check if billed separately Has the patient been seen at the hospital within the last three years: Yes Total Score: 125 Level Of Care: New/Established - Level 4 Electronic Signature(s) Signed: 01/16/2021 5:48:05 PM By: Baruch Gouty RN, BSN Entered By: Baruch Gouty on 01/16/2021 13:54:37 -------------------------------------------------------------------------------- Encounter Discharge Information Details Patient Name: Date of Service: Micheal Oliver 01/16/2021 1:15 PM Medical Record Number: [id number] Patient Account Number: [id number] Date of Birth/Sex: Treating RN: [birth year] (70 y.o. Male) Baruch Gouty Primary Care Jadine Brumley: Lamar Blinks Other Clinician: Referring Janani Chamber: Treating Jamisyn Langer/Extender: [place], [place] SSA  [place] Weeks in Treatment: 0 Encounter Discharge Information Items Post Procedure Vitals Discharge Condition: Stable Temperature (F): 98.4 Ambulatory Status: Ambulatory Pulse (bpm): 82 Discharge Destination: Home Respiratory Rate (breaths/min): 18 Transportation: Private Auto Blood Pressure (mmHg): 127/83 Accompanied By: spouse Schedule Follow-up Appointment: Yes Clinical Summary of Care: Patient Declined Electronic Signature(s) Signed: 01/16/2021 5:48:05 PM By: Baruch Gouty RN, BSN Entered By: Baruch Gouty on 01/16/2021 14:19:01 -------------------------------------------------------------------------------- Lower Extremity Assessment Details Patient Name: Date of Service: Micheal Oliver, Micheal Oliver 01/16/2021 1:15 PM Medical Record Number: [id number] Patient Account Number: [id number] Date of Birth/Sex: Treating RN: Nov 12, 1950 (70 y.o. Male) Baruch Gouty Primary Care Christyne Mccain: Lamar Blinks Other Clinician: Referring Braley Luckenbaugh: Treating Khayden Herzberg/Extender: [place], [place] SSA [place] Weeks in Treatment: 0 Edema Assessment Assessed: [Left: No] [Right: No] Edema: [Left: Yes] [Right: Yes] Electronic Signature(s) Signed: 01/16/2021 5:48:05 PM By: Baruch Gouty RN, BSN Entered By: Baruch Gouty on 01/16/2021 13:26:34 -------------------------------------------------------------------------------- Multi Wound Chart Details Patient Name: Date of Service: Micheal Oliver 01/16/2021 1:15 PM Medical Record Number: [id number] Patient Account Number: [id number] Date of Birth/Sex: Treating RN: Sep 28, 1950 (70 y.o. Male) Lorrin Jackson Primary Care Kyng Matlock: Lamar Blinks Other Clinician: Referring Sharbel Sahagun: Treating Inola Lisle/Extender: [place], [place] S[place] Weeks in Treatment: 0 Vital Signs Height(in): 74 Pulse(bpm): 15 Weight(lbs): 165 Blood Pressure(mmHg): 127/83 Body Mass Index(BMI): 21 Temperature(F): 98.4 Respiratory  Rate(breaths/min): 18 Photos: [N/A:N/A] Right Gluteus N/A N/A Wound Location: Pressure Injury N/A N/A Wounding Event: Pressure Ulcer N/A N/A Primary Etiology: Arrhythmia, Hypertension, Type II N/A N/A Comorbid History: Diabetes, Received Chemotherapy, Received Radiation, Anorexia/bulimia 12/15/2020 N/A N/A Date Acquired: 0 N/A N/A Weeks of Treatment: Open N/A N/A Wound Status: 0.8x1x0.1 N/A N/A Measurements L x W x D (cm) 0.628 N/A N/A A (cm) : rea 0.063 N/A N/A Volume (cm) : 0.00% N/A N/A % Reduction in A rea: 0.00% N/A N/A % Reduction in Volume: Category/Stage II N/A N/A Classification: Small N/A N/A Exudate A mount: Serous N/A N/A Exudate Type: amber N/A N/A Exudate Color: Distinct, outline attached N/A N/A Wound Margin: Large (67-100%) N/A N/A  Granulation A mount: Pink N/A N/A Granulation Quality: None Present (0%) N/A N/A Necrotic A mount: Fascia: No N/A N/A Exposed Structures: Fat Layer (Subcutaneous Tissue): No Tendon: No Muscle: No Joint: No Bone: No Limited to Skin Breakdown Medium (34-66%) N/A N/A Epithelialization: Treatment Notes Electronic Signature(s) Signed: 01/16/2021 5:32:36 PM By: Linton Ham MD Signed: 01/16/2021 5:53:01 PM By: Lorrin Jackson Entered By: Linton Ham on 01/16/2021 14:01:10 -------------------------------------------------------------------------------- Multi-Disciplinary Care Plan Details Patient Name: Date of Service: Micheal Oliver, Micheal Oliver 01/16/2021 1:15 PM Medical Record Number: [id number] Patient Account Number: [id number] Date of Birth/Sex: Treating RN: July 26, 1950 (70 y.o. Male) Baruch Gouty Primary Care Kandance Yano: Lamar Blinks Other Clinician: Referring Ronnald Shedden: Treating Elky Funches/Extender: [place], [place] SSA [place] Weeks in Treatment: [place] reviewed with physician Active Inactive Nutrition Nursing Diagnoses: Imbalanced nutrition Potential for  alteratiion in Nutrition/Potential for imbalanced nutrition Goals: Patient/caregiver will maintain therapeutic glucose control Date Initiated: 01/16/2021 Target Resolution Date: 02/13/2021 Goal Status: Active Interventions: Assess HgA1c results as ordered upon admission and as needed Assess patient nutrition upon admission and as needed per policy Provide education on nutrition Treatment Activities: Dietary management education, guidance and counseling : 01/16/2021 Patient referred to Primary Care Physician for further nutritional evaluation : 01/16/2021 Notes: Pressure Nursing Diagnoses: Knowledge deficit related to causes and risk factors for pressure ulcer development Knowledge deficit related to management of pressures ulcers Potential for impaired tissue integrity related to pressure, friction, moisture, and shear Goals: Patient/caregiver will verbalize understanding of pressure ulcer management Date Initiated: 01/16/2021 Target Resolution Date: 02/13/2021 Goal Status: Active Interventions: Assess offloading mechanisms upon admission and as needed Assess potential for pressure ulcer upon admission and as needed Provide education on pressure ulcers Notes: Wound/Skin Impairment Nursing Diagnoses: Impaired tissue integrity Knowledge deficit related to ulceration/compromised skin integrity Goals: Patient/caregiver will verbalize understanding of skin care regimen Date Initiated: 01/16/2021 Target Resolution Date: 02/13/2021 Goal Status: Active Ulcer/skin breakdown will have a volume reduction of 30% by week 4 Date Initiated: 01/16/2021 Target Resolution Date: 02/13/2021 Goal Status: Active Interventions: Assess patient/caregiver ability to obtain necessary supplies Assess patient/caregiver ability to perform ulcer/skin care regimen upon admission and as needed Assess ulceration(s) every visit Provide education on ulcer and skin care Treatment Activities: Skin care  regimen initiated : 01/16/2021 Topical wound management initiated : 01/16/2021 Notes: Electronic Signature(s) Signed: 01/16/2021 5:48:05 PM By: Baruch Gouty RN, BSN Entered By: Baruch Gouty on 01/16/2021 13:50:52 -------------------------------------------------------------------------------- Pain Assessment Details Patient Name: Date of Service: Micheal Oliver, Micheal Oliver 01/16/2021 1:15 PM Medical Record Number: [id number] Patient Account Number: [id number] Date of Birth/Sex: Treating RN: 07/16/1950 (70 y.o. Male) Baruch Gouty Primary Care Rosalee Tolley: Lamar Blinks Other Clinician: Referring Allie Gerhold: Treating Sholanda Croson/Extender: [place], [place] S[place] Weeks in Treatment: 0 Active Problems Location of Pain Severity and Description of Pain Patient Has Paino Yes Site Locations Pain Location: Pain in Ulcers Duration of the Pain. Constant / Intermittento Intermittent Rate the pain. Current Pain Level: 0 Worst Pain Level: 6 Least Pain Level: 0 Character of Pain Describe the Pain: Sharp, Shooting Pain Management and Medication Current Pain Management: Other: reposition Is the Current Pain Management Adequate: Adequate How does your wound impact your activities of daily livingo Sleep: Yes Bathing: No Appetite: No Relationship With Others: No Bladder Continence: No Emotions: No Bowel Continence: No Work: No Toileting: No Drive: No Dressing: No Hobbies: No Electronic Signature(s) Signed: 01/16/2021 5:48:05 PM By: Baruch Gouty RN, BSN Entered By: Baruch Gouty on 01/16/2021 13:38:51 -------------------------------------------------------------------------------- Patient/Caregiver Education Details Patient Name:  Date of Service: Micheal Oliver, Micheal Oliver 10/17/2022andnbsp1:15 PM Medical Record Number: [id number] Patient Account Number: [id number] Date of Birth/Gender: Treating RN: Dec 24, 1950 (70 y.o. Male) Baruch Gouty Primary Care Physician:  Lamar Blinks Other Clinician: Referring Physician: Treating Physician/Extender: [place]im[place], [place] SSA [place] Weeks in Treatment: 0 Education Assessment Education Provided To: Patient Education Topics Provided Pressure: Handouts: Pressure Ulcers: Care and Offloading, Preventing Pressure Ulcers Methods: Explain/Verbal, Printed Responses: Reinforcements needed, State content correctly [place]: o Handouts: [place] o Methods: Explain/Verbal, Printed Wound/Skin Impairment: Handouts: Caring for Your Ulcer, Skin Care Do's and Dont's Methods: Explain/Verbal, Printed Responses: Reinforcements needed, State content correctly Electronic Signature(s) Signed: 01/16/2021 5:48:05 PM By: Baruch Gouty RN, BSN Entered By: Baruch Gouty on 01/16/2021 13:52:57 -------------------------------------------------------------------------------- Wound Assessment Details Patient Name: Date of Service: Micheal Oliver, Micheal Oliver 01/16/2021 1:15 PM Medical Record Number: [id number] Patient Account Number: [id number] Date of Birth/Sex: Treating RN: 07/01/1950 (70 y.o. Male) Baruch Gouty Primary Care Emelee Rodocker: Lamar Blinks Other Clinician: Referring Narvel Kozub: Treating Fortino Haag/Extender: [place], [place] S[place] Weeks in Treatment: 0 Wound Status Wound Number: 1 Primary Pressure Ulcer Etiology: Wound Location: Right Gluteus Wound Open Wounding Event: Pressure Injury Status: Date Acquired: 12/15/2020 Comorbid Arrhythmia, Hypertension, Type II Diabetes, Received Weeks Of Treatment: 0 History: Chemotherapy, Received Radiation, Anorexia/bulimia Clustered Wound: No Photos Wound Measurements Length: (cm) 0.8 Width: (cm) 1 Depth: (cm) 0.1 Area: (cm) 0.628 Volume: (cm) 0.063 % Reduction in Area: 0% % Reduction in Volume: 0% Epithelialization: Medium (34-66%) Tunneling: No Undermining: No Wound  Description Classification: Category/Stage II Wound Margin: Distinct, outline attached Exudate Amount: Small Exudate Type: Serous Exudate Color: amber Foul Odor After Cleansing: No Slough/Fibrino No Wound Bed Granulation Amount: Large (67-100%) Exposed Structure Granulation Quality: Pink Fascia Exposed: No Necrotic Amount: None Present (0%) Fat Layer (Subcutaneous Tissue) Exposed: No Tendon Exposed: No Muscle Exposed: No Joint Exposed: No Bone Exposed: No Limited to Skin Breakdown Treatment Notes Wound #1 (Gluteus) Wound Laterality: Right Cleanser Micheal Oliver Saline Discharge Instruction: Cleanse the wound with Micheal Oliver Saline prior to applying a clean dressing using gauze sponges, not tissue or cotton balls. Soap and Water Discharge Instruction: May shower and wash wound with dial antibacterial soap and water prior to dressing change. Peri-Wound Care Topical Primary Dressing Promogran Prisma Matrix, 4.34 (sq in) (silver collagen) Discharge Instruction: Moisten collagen with saline or hydrogel Secondary Dressing Zetuvit Plus Silicone Border Dressing 4x4 (in/in) Discharge Instruction: Apply silicone border over primary dressing as directed. Secured With Compression Wrap Compression Stockings Environmental education officer) Signed: 01/16/2021 5:48:05 PM By: Baruch Gouty RN, BSN Entered By: Baruch Gouty on 01/16/2021 13:37:37 -------------------------------------------------------------------------------- Vitals Details Patient Name: Date of Service: Micheal Oliver, Micheal Oliver 01/16/2021 1:15 PM Medical Record Number: [id number] Patient Account Number: [id number] Date of Birth/Sex: Treating RN: [birth year] (70 y.o. Male) Baruch Gouty Primary Care [place] Nabor: Lamar Blinks Other Clinician: Referring Saraann Enneking: Treating Orabelle Rylee/Extender: [place], [place] S[place] Weeks in Treatment: 0 Vital Signs Time Taken: 13:13 Temperature (F): 98.4 Height (in):  74 Pulse (bpm): 82 Source: Stated Respiratory Rate (breaths/min): 18 Weight (lbs): 165 Blood Pressure (mmHg): 127/83 Source: Stated Reference Range: 80 - 120 mg / dl Body Mass Index (BMI): 21.2 Electronic Signature(s) Signed: 01/16/2021 5:48:05 PM By: Baruch Gouty RN, BSN Entered By: Baruch Gouty on 01/16/2021 13:15:11

## 2021-01-16 NOTE — Progress Notes (Signed)
HUSAM, HOHN ([id number]) Visit Report for 01/16/2021 Abuse/Suicide Risk Screen Details Patient Name: Date of Service: Micheal Oliver, Micheal Oliver 01/16/2021 1:15 PM Medical Record Number: [id number] Patient Account Number: [id number] Date of Birth/Sex: Treating RN: March 03, 1951 (70 y.o. Male) Baruch Gouty Primary Care Diasha Castleman: Lamar Blinks Other Clinician: Referring Bryam Taborda: Treating Nia Nathaniel/Extender: [place], [place] SSA [place] Weeks in Treatment: 0 Abuse/Suicide Risk Screen Items Answer ABUSE RISK SCREEN: Has anyone close to you tried to hurt or harm you recentlyo No Do you feel uncomfortable with anyone in your familyo No Has anyone forced you do things that you didnt want to doo No Electronic Signature(s) Signed: 01/16/2021 5:48:05 PM By: Baruch Gouty RN, BSN Entered By: Baruch Gouty on 01/16/2021 13:22:53 -------------------------------------------------------------------------------- Activities of Daily Living Details Patient Name: Date of Service: Micheal Oliver, Micheal Oliver 01/16/2021 1:15 PM Medical Record Number: [id number] Patient Account Number: [id number] Date of Birth/Sex: Treating RN: 11/24/1950 (70 y.o. Male) Baruch Gouty Primary Care Faysal Fenoglio: Lamar Blinks Other Clinician: Referring Dennice Tindol: Treating Zohra Clavel/Extender: [place], [place] SSA [place] Weeks in Treatment: 0 Activities of Daily Living Items Answer Activities of Daily Living (Please select one for each item) Drive Automobile Completely Able T Medications ake Completely Able Use T elephone Completely Able Care for Appearance Completely Able Use T oilet Completely Able Bath / Shower Completely Able Dress Self Completely Able Feed Self Completely Able Walk Completely Able Get In / Out Bed Completely Able Housework Completely Able Prepare Meals Completely [place] for Self Completely Able Electronic Signature(s) Signed:  01/16/2021 5:48:05 PM By: Baruch Gouty RN, BSN Entered By: Baruch Gouty on 01/16/2021 13:23:22 -------------------------------------------------------------------------------- Education Screening Details Patient Name: Date of Service: Micheal Oliver, Micheal Oliver 01/16/2021 1:15 PM Medical Record Number: [id number] Patient Account Number: [id number] Date of Birth/Sex: Treating RN: Sep 18, 1950 (70 y.o. Male) Baruch Gouty Primary Care Pippa Hanif: Lamar Blinks Other Clinician: Referring Lesha Jager: Treating Sahory Nordling/Extender: [place], [place] SSA NDRA Weeks in Treatment: 0 Primary Learner Assessed: Patient Learning Preferences/Education Level/Primary Language Learning Preference: Explanation, Demonstration, Printed Material Highest Education Level: College or Above Preferred Language: English Cognitive Barrier Language Barrier: No Translator Needed: No Memory Deficit: No Emotional Barrier: No Cultural/Religious Beliefs Affecting Medical Care: No Physical Barrier Impaired Vision: Yes Glasses Impaired Hearing: No Decreased Hand dexterity: No Knowledge/Comprehension Knowledge Level: High Comprehension Level: High Ability to understand written instructions: High Ability to understand verbal instructions: High Motivation Anxiety Level: Calm Cooperation: Cooperative Education Importance: Acknowledges Need Interest in Health Problems: Asks Questions Perception: Coherent Willingness to Engage in Self-Management High Activities: Readiness to Engage in Self-Management High Activities: Electronic Signature(s) Signed: 01/16/2021 5:48:05 PM By: Baruch Gouty RN, BSN Entered By: Baruch Gouty on 01/16/2021 13:23:57 -------------------------------------------------------------------------------- Fall Risk Assessment Details Patient Name: Date of Service: Micheal Oliver, Micheal Oliver 01/16/2021 1:15 PM Medical Record Number: [id number] Patient Account Number: [id number] Date  of Birth/Sex: Treating RN: [birth year] (70 y.o. Male) Baruch Gouty Primary Care Stashia Sia: Lamar Blinks Other Clinician: Referring Ting Cage: Treating Deshanda Molitor/Extender: [place], [place] SSA NDRA Weeks in Treatment: 0 Fall Risk Assessment Items Have you had 2 or more falls in the last 12 monthso 0 Yes Have you had any fall that resulted in injury in the last 12 monthso 0 No FALLS RISK SCREEN History of falling - immediate or within 3 months 0 No Secondary diagnosis (Do you have 2 or more medical diagnoseso) 15 Yes Ambulatory aid None/bed rest/wheelchair/nurse 0 Yes Crutches/cane/walker 0 No Furniture 0 No Intravenous therapy Access/Saline/Heparin Lock 0 No Gait/Transferring Normal/  bed rest/ wheelchair 0 Yes Weak (short steps with or without shuffle, stooped but able to lift head while walking, may seek 0 No support from furniture) Impaired (short steps with shuffle, may have difficulty arising from chair, head down, impaired 0 No balance) Mental Status Oriented to own ability 0 Yes Electronic Signature(s) Signed: 01/16/2021 5:48:05 PM By: Baruch Gouty RN, BSN Entered By: Baruch Gouty on 01/16/2021 13:24:34 -------------------------------------------------------------------------------- Foot Assessment Details Patient Name: Date of Service: Micheal Oliver 01/16/2021 1:15 PM Medical Record Number: [id number] Patient Account Number: [id number] Date of Birth/Sex: Treating RN: 08/21/1950 (70 y.o. Male) Baruch Gouty Primary Care Deklen Popelka: Lamar Blinks Other Clinician: Referring Noris Kulinski: Treating Macyn Remmert/Extender: [place], [place] SSA NDRA Weeks in Treatment: 0 Foot Assessment Items Site Locations + = Sensation present, - = Sensation absent, C = Callus, U = Ulcer R = Redness, W = Warmth, Oliver = Maceration, PU = Pre-ulcerative lesion F = Fissure, S = Swelling, D = Dryness Assessment Right: Left: Other Deformity: No  No Prior Foot Ulcer: No No Prior Amputation: No No Charcot Joint: No No Ambulatory Status: Ambulatory Without Help Gait: Steady Electronic Signature(s) Signed: 01/16/2021 5:48:05 PM By: Baruch Gouty RN, BSN Entered By: Baruch Gouty on 01/16/2021 13:26:20 -------------------------------------------------------------------------------- Nutrition Risk Screening Details Patient Name: Date of Service: Micheal Oliver, Micheal Oliver 01/16/2021 1:15 PM Medical Record Number: [id number] Patient Account Number: [id number] Date of Birth/Sex: Treating RN: 1950-11-09 (70 y.o. Male) Baruch Gouty Primary Care Deyci Gesell: Lamar Blinks Other Clinician: Referring Jayme Mednick: Treating Adithya Difrancesco/Extender: [place], [place] SSA NDRA Weeks in Treatment: 0 Height (in): 74 Weight (lbs): 165 Body Mass Index (BMI): 21.2 Nutrition Risk Screening Items Score Screening NUTRITION RISK SCREEN: I have an illness or condition that made me change the kind and/or amount of food I eat 2 Yes I eat fewer than two meals per day 3 Yes I eat few fruits and vegetables, or milk products 0 No I have three or more drinks of beer, liquor or wine almost every day 0 No I have tooth or mouth problems that make it hard for me to eat 0 No I don't always have enough money to buy the food I need 0 No I eat alone most of the time 0 No I take three or more different prescribed or over-the-counter drugs a day 1 Yes Without wanting to, I have lost or gained 10 pounds in the last six months 2 Yes I am not always physically able to shop, cook and/or feed myself 0 No Nutrition Protocols Good Risk Protocol Moderate Risk Protocol High Risk Proctocol 0 Provide education on nutrition Risk Level: High Risk Score: 8 Electronic Signature(s) Signed: 01/16/2021 5:48:05 PM By: Baruch Gouty RN, BSN Entered By: Baruch Gouty on 01/16/2021 13:26:09

## 2021-01-17 ENCOUNTER — Other Ambulatory Visit: Payer: Self-pay | Admitting: Physician Assistant

## 2021-01-17 ENCOUNTER — Ambulatory Visit (HOSPITAL_COMMUNITY): Payer: Medicare HMO

## 2021-01-17 DIAGNOSIS — C3411 Malignant neoplasm of upper lobe, right bronchus or lung: Secondary | ICD-10-CM

## 2021-01-17 DIAGNOSIS — S31819A Unspecified open wound of right buttock, initial encounter: Secondary | ICD-10-CM | POA: Diagnosis not present

## 2021-01-18 ENCOUNTER — Inpatient Hospital Stay (HOSPITAL_BASED_OUTPATIENT_CLINIC_OR_DEPARTMENT_OTHER): Payer: Medicare HMO | Admitting: Physician Assistant

## 2021-01-18 ENCOUNTER — Inpatient Hospital Stay: Payer: Medicare HMO

## 2021-01-18 ENCOUNTER — Other Ambulatory Visit: Payer: Self-pay

## 2021-01-18 VITALS — BP 115/68 | HR 91 | Temp 97.1°F | Resp 18 | Wt 164.1 lb

## 2021-01-18 DIAGNOSIS — C3411 Malignant neoplasm of upper lobe, right bronchus or lung: Secondary | ICD-10-CM | POA: Diagnosis not present

## 2021-01-18 DIAGNOSIS — Z923 Personal history of irradiation: Secondary | ICD-10-CM | POA: Diagnosis not present

## 2021-01-18 DIAGNOSIS — Z95828 Presence of other vascular implants and grafts: Secondary | ICD-10-CM

## 2021-01-18 DIAGNOSIS — J90 Pleural effusion, not elsewhere classified: Secondary | ICD-10-CM | POA: Diagnosis not present

## 2021-01-18 DIAGNOSIS — Z5111 Encounter for antineoplastic chemotherapy: Secondary | ICD-10-CM | POA: Diagnosis not present

## 2021-01-18 DIAGNOSIS — Z79899 Other long term (current) drug therapy: Secondary | ICD-10-CM | POA: Diagnosis not present

## 2021-01-18 LAB — CBC WITH DIFFERENTIAL (CANCER CENTER ONLY)
Abs Immature Granulocytes: 0.02 10*3/uL (ref 0.00–0.07)
Basophils Absolute: 0 10*3/uL (ref 0.0–0.1)
Basophils Relative: 0 %
Eosinophils Absolute: 0 10*3/uL (ref 0.0–0.5)
Eosinophils Relative: 1 %
HCT: 28.6 % — ABNORMAL LOW (ref 39.0–52.0)
Hemoglobin: 9.6 g/dL — ABNORMAL LOW (ref 13.0–17.0)
Immature Granulocytes: 0 %
Lymphocytes Relative: 11 %
Lymphs Abs: 0.5 10*3/uL — ABNORMAL LOW (ref 0.7–4.0)
MCH: 35.6 pg — ABNORMAL HIGH (ref 26.0–34.0)
MCHC: 33.6 g/dL (ref 30.0–36.0)
MCV: 105.9 fL — ABNORMAL HIGH (ref 80.0–100.0)
Monocytes Absolute: 0.5 10*3/uL (ref 0.1–1.0)
Monocytes Relative: 10 %
Neutro Abs: 3.6 10*3/uL (ref 1.7–7.7)
Neutrophils Relative %: 78 %
Platelet Count: 266 10*3/uL (ref 150–400)
RBC: 2.7 MIL/uL — ABNORMAL LOW (ref 4.22–5.81)
RDW: 18 % — ABNORMAL HIGH (ref 11.5–15.5)
WBC Count: 4.7 10*3/uL (ref 4.0–10.5)
nRBC: 0 % (ref 0.0–0.2)

## 2021-01-18 LAB — CMP (CANCER CENTER ONLY)
ALT: 52 U/L — ABNORMAL HIGH (ref 0–44)
AST: 28 U/L (ref 15–41)
Albumin: 3 g/dL — ABNORMAL LOW (ref 3.5–5.0)
Alkaline Phosphatase: 88 U/L (ref 38–126)
Anion gap: 12 (ref 5–15)
BUN: 24 mg/dL — ABNORMAL HIGH (ref 8–23)
CO2: 25 mmol/L (ref 22–32)
Calcium: 9.1 mg/dL (ref 8.9–10.3)
Chloride: 102 mmol/L (ref 98–111)
Creatinine: 1.17 mg/dL (ref 0.61–1.24)
GFR, Estimated: >60 mL/min (ref >60)
Glucose, Bld: 131 mg/dL — ABNORMAL HIGH (ref 70–99)
Potassium: 4.1 mmol/L (ref 3.5–5.1)
Sodium: 139 mmol/L (ref 135–145)
Total Bilirubin: 0.7 mg/dL (ref 0.3–1.2)
Total Protein: 5.9 g/dL — ABNORMAL LOW (ref 6.5–8.1)

## 2021-01-18 MED ORDER — SODIUM CHLORIDE 0.9% FLUSH
10.0000 mL | INTRAVENOUS | Status: DC | PRN
  Administered 2021-01-18: 10 mL

## 2021-01-18 MED ORDER — PROCHLORPERAZINE MALEATE 10 MG PO TABS: 10.0000 mg | ORAL_TABLET | Freq: Once | ORAL | Status: DC

## 2021-01-18 MED ORDER — SODIUM CHLORIDE 0.9 % IV SOLN
800.0000 mg/m2 | Freq: Once | INTRAVENOUS | Status: AC
  Administered 2021-01-18: 1634 mg via INTRAVENOUS
  Filled 2021-01-18: qty 42.98

## 2021-01-18 MED ORDER — SODIUM CHLORIDE 0.9% FLUSH
10.0000 mL | INTRAVENOUS | Status: DC | PRN
  Administered 2021-01-18: 10 mL via INTRAVENOUS

## 2021-01-18 MED ORDER — SODIUM CHLORIDE 0.9 % IV SOLN: Freq: Once | INTRAVENOUS | Status: AC

## 2021-01-18 MED ORDER — HEPARIN SOD (PORK) LOCK FLUSH 100 UNIT/ML IV SOLN
500.0000 [IU] | Freq: Once | INTRAVENOUS | Status: AC | PRN
Start: 2021-01-18 — End: 2021-01-18
  Administered 2021-01-18: 500 [IU]

## 2021-01-18 NOTE — Patient Instructions (Signed)
[facility]  Discharge Instructions: Thank you for choosing [facility] to provide your oncology and hematology care.   If you have a lab appointment with the [facility], please go directly to the [facility] and check in at the registration area.   Wear comfortable clothing and clothing appropriate for easy access to any Portacath or PICC line.   We strive to give you quality time with your provider. You may need to reschedule your appointment if you arrive late (15 or more minutes).  Arriving late affects you and other patients whose appointments are after yours.  Also, if you miss three or more appointments without notifying the office, you may be dismissed from the clinic at the provider's discretion.      For prescription refill requests, have your pharmacy contact our office and allow 72 hours for refills to be completed.    Today you received the following chemotherapy and/or immunotherapy agents gemzar      To help prevent nausea and vomiting after your treatment, we encourage you to take your nausea medication as directed.  BELOW ARE SYMPTOMS THAT SHOULD BE REPORTED IMMEDIATELY: *FEVER GREATER THAN 100.4 F (38 C) OR HIGHER *CHILLS OR SWEATING *NAUSEA AND VOMITING THAT IS NOT CONTROLLED WITH YOUR NAUSEA MEDICATION *UNUSUAL SHORTNESS OF BREATH *UNUSUAL BRUISING OR BLEEDING *URINARY PROBLEMS (pain or burning when urinating, or frequent urination) *BOWEL PROBLEMS (unusual diarrhea, constipation, pain near the anus) TENDERNESS IN MOUTH AND THROAT WITH OR WITHOUT PRESENCE OF ULCERS (sore throat, sores in mouth, or a toothache) UNUSUAL RASH, SWELLING OR PAIN  UNUSUAL VAGINAL DISCHARGE OR ITCHING   Items with * indicate a potential emergency and should be followed up as soon as possible or go to the Emergency Department if any problems should occur.  Please show the CHEMOTHERAPY ALERT CARD or IMMUNOTHERAPY ALERT CARD at check-in to the  Emergency Department and triage nurse.  Should you have questions after your visit or need to cancel or reschedule your appointment, please contact [facility]  Dept: [phone]  and follow the prompts.  Office hours are 8:00 a.m. to 4:30 p.m. Monday - Friday. Please note that voicemails left after 4:00 p.m. may not be returned until the following business day.  We are closed weekends and major holidays. You have access to a nurse at all times for urgent questions. Please call the main number to the clinic Dept: [phone] and follow the prompts.   For any non-urgent questions, you may also contact your provider using MyChart. We now offer e-Visits for anyone 102 and older to request care online for non-urgent symptoms. For details visit [email or web address].   Also download the MyChart app! Go to the app store, search "MyChart", open the app, select [facility], and log in with your MyChart username and password.  Due to Covid, a mask is required upon entering the hospital/clinic. If you do not have a mask, one will be given to you upon arrival. For doctor visits, patients may have 1 support person aged 55 or older with them. For treatment visits, patients cannot have anyone with them due to current Covid guidelines and our immunocompromised population.

## 2021-01-20 ENCOUNTER — Ambulatory Visit (HOSPITAL_COMMUNITY)
Admission: RE | Admit: 2021-01-20 | Discharge: 2021-01-20 | Disposition: A | Discharge status: Home / Self Care | Payer: Medicare HMO | Source: Ambulatory Visit | Attending: Physician Assistant | Admitting: Physician Assistant

## 2021-01-20 ENCOUNTER — Other Ambulatory Visit (HOSPITAL_COMMUNITY): Payer: Self-pay | Admitting: Physician Assistant

## 2021-01-20 ENCOUNTER — Telehealth: Payer: Self-pay | Admitting: Physician Assistant

## 2021-01-20 ENCOUNTER — Other Ambulatory Visit: Payer: Self-pay

## 2021-01-20 DIAGNOSIS — R918 Other nonspecific abnormal finding of lung field: Secondary | ICD-10-CM | POA: Diagnosis not present

## 2021-01-20 DIAGNOSIS — Z9889 Other specified postprocedural states: Secondary | ICD-10-CM

## 2021-01-20 DIAGNOSIS — C3490 Malignant neoplasm of unspecified part of unspecified bronchus or lung: Secondary | ICD-10-CM | POA: Diagnosis not present

## 2021-01-20 DIAGNOSIS — Z48813 Encounter for surgical aftercare following surgery on the respiratory system: Secondary | ICD-10-CM | POA: Diagnosis not present

## 2021-01-20 DIAGNOSIS — J90 Pleural effusion, not elsewhere classified: Secondary | ICD-10-CM | POA: Insufficient documentation

## 2021-01-20 MED ORDER — LIDOCAINE HCL 1 % IJ SOLN
INTRAMUSCULAR | Status: AC
  Filled 2021-01-20: qty 20

## 2021-01-20 NOTE — Procedures (Signed)
PROCEDURE SUMMARY:  Successful US guided right thoracentesis. Yielded 1.7 L of amber fluid. Pt tolerated procedure well. No immediate complications.  Specimen not sent for labs. CXR ordered.  EBL < 5 mL  Nasra Counce PA-C 01/20/2021 10:21 AM

## 2021-01-20 NOTE — Telephone Encounter (Signed)
Sch per 9/29 los, pt aware 

## 2021-01-23 ENCOUNTER — Ambulatory Visit (INDEPENDENT_AMBULATORY_CARE_PROVIDER_SITE_OTHER): Payer: Medicare HMO

## 2021-01-23 ENCOUNTER — Telehealth: Payer: Self-pay

## 2021-01-23 DIAGNOSIS — I639 Cerebral infarction, unspecified: Secondary | ICD-10-CM

## 2021-01-23 LAB — CUP PACEART REMOTE DEVICE CHECK
Date Time Interrogation Session: 20221023005618
Implantable Pulse Generator Implant Date: 20210907

## 2021-01-23 NOTE — Telephone Encounter (Signed)
"  Repeating MDT ILR alert for AF burden 100%. +OAC.  Clinic will address AF alert at next OV. LH"  Discussed alerts and known burden with Dr. Curt Bears today. Eliquis and Cardizem previously prescribed. V rates controlled. OK to turn off AT/AF detection. Will continue to monitor tachy with rapid onset at 182 beats for 48 beats. Successful telephone call to patient to discuss. Patient verbalized understanding. Device reprogrammed remotely via [employer].

## 2021-01-24 ENCOUNTER — Ambulatory Visit (HOSPITAL_COMMUNITY)
Admission: RE | Admit: 2021-01-24 | Discharge: 2021-01-24 | Disposition: A | Discharge status: Home / Self Care | Payer: Medicare HMO | Source: Ambulatory Visit | Attending: Physician Assistant | Admitting: Physician Assistant

## 2021-01-24 ENCOUNTER — Encounter (HOSPITAL_COMMUNITY): Payer: Self-pay

## 2021-01-24 ENCOUNTER — Other Ambulatory Visit: Payer: Self-pay

## 2021-01-24 DIAGNOSIS — C3411 Malignant neoplasm of upper lobe, right bronchus or lung: Secondary | ICD-10-CM | POA: Diagnosis present

## 2021-01-24 DIAGNOSIS — C3490 Malignant neoplasm of unspecified part of unspecified bronchus or lung: Secondary | ICD-10-CM | POA: Diagnosis not present

## 2021-01-24 DIAGNOSIS — K5730 Diverticulosis of large intestine without perforation or abscess without bleeding: Secondary | ICD-10-CM | POA: Diagnosis not present

## 2021-01-24 DIAGNOSIS — J90 Pleural effusion, not elsewhere classified: Secondary | ICD-10-CM | POA: Diagnosis not present

## 2021-01-24 DIAGNOSIS — R911 Solitary pulmonary nodule: Secondary | ICD-10-CM | POA: Diagnosis not present

## 2021-01-24 DIAGNOSIS — N210 Calculus in bladder: Secondary | ICD-10-CM | POA: Diagnosis not present

## 2021-01-24 DIAGNOSIS — R918 Other nonspecific abnormal finding of lung field: Secondary | ICD-10-CM | POA: Diagnosis not present

## 2021-01-24 MED ORDER — HEPARIN SOD (PORK) LOCK FLUSH 100 UNIT/ML IV SOLN
500.0000 [IU] | Freq: Once | INTRAVENOUS | Status: AC
  Administered 2021-01-24: 500 [IU] via INTRAVENOUS

## 2021-01-24 MED ORDER — HEPARIN SOD (PORK) LOCK FLUSH 100 UNIT/ML IV SOLN
INTRAVENOUS | Status: AC
  Filled 2021-01-24: qty 5

## 2021-01-24 MED ORDER — IOHEXOL 350 MG/ML SOLN
80.0000 mL | Freq: Once | INTRAVENOUS | Status: AC | PRN
  Administered 2021-01-24: 80 mL via INTRAVENOUS

## 2021-01-25 ENCOUNTER — Ambulatory Visit: Payer: Medicare HMO | Admitting: Physician Assistant

## 2021-01-25 ENCOUNTER — Inpatient Hospital Stay: Payer: Medicare HMO

## 2021-01-25 ENCOUNTER — Telehealth: Payer: Self-pay | Admitting: Physician Assistant

## 2021-01-25 ENCOUNTER — Other Ambulatory Visit: Payer: Self-pay | Admitting: Physician Assistant

## 2021-01-25 VITALS — BP 117/80 | HR 85 | Temp 98.3°F | Resp 18 | Wt 161.0 lb

## 2021-01-25 DIAGNOSIS — C3411 Malignant neoplasm of upper lobe, right bronchus or lung: Secondary | ICD-10-CM

## 2021-01-25 DIAGNOSIS — Z923 Personal history of irradiation: Secondary | ICD-10-CM | POA: Diagnosis not present

## 2021-01-25 DIAGNOSIS — J90 Pleural effusion, not elsewhere classified: Secondary | ICD-10-CM

## 2021-01-25 DIAGNOSIS — D709 Neutropenia, unspecified: Secondary | ICD-10-CM | POA: Insufficient documentation

## 2021-01-25 DIAGNOSIS — Z95828 Presence of other vascular implants and grafts: Secondary | ICD-10-CM

## 2021-01-25 DIAGNOSIS — Z79899 Other long term (current) drug therapy: Secondary | ICD-10-CM | POA: Diagnosis not present

## 2021-01-25 DIAGNOSIS — D701 Agranulocytosis secondary to cancer chemotherapy: Secondary | ICD-10-CM

## 2021-01-25 DIAGNOSIS — Z5111 Encounter for antineoplastic chemotherapy: Secondary | ICD-10-CM | POA: Diagnosis not present

## 2021-01-25 LAB — CBC WITH DIFFERENTIAL (CANCER CENTER ONLY)
Abs Immature Granulocytes: 0.1 10*3/uL — ABNORMAL HIGH (ref 0.00–0.07)
Basophils Absolute: 0 10*3/uL (ref 0.0–0.1)
Basophils Relative: 3 %
Eosinophils Absolute: 0 10*3/uL (ref 0.0–0.5)
Eosinophils Relative: 1 %
HCT: 26.7 % — ABNORMAL LOW (ref 39.0–52.0)
Hemoglobin: 9.2 g/dL — ABNORMAL LOW (ref 13.0–17.0)
Immature Granulocytes: 7 %
Lymphocytes Relative: 28 %
Lymphs Abs: 0.4 10*3/uL — ABNORMAL LOW (ref 0.7–4.0)
MCH: 36.1 pg — ABNORMAL HIGH (ref 26.0–34.0)
MCHC: 34.5 g/dL (ref 30.0–36.0)
MCV: 104.7 fL — ABNORMAL HIGH (ref 80.0–100.0)
Monocytes Absolute: 0.3 10*3/uL (ref 0.1–1.0)
Monocytes Relative: 23 %
Neutro Abs: 0.5 10*3/uL — ABNORMAL LOW (ref 1.7–7.7)
Neutrophils Relative %: 38 %
Platelet Count: 306 10*3/uL (ref 150–400)
RBC: 2.55 MIL/uL — ABNORMAL LOW (ref 4.22–5.81)
RDW: 18.2 % — ABNORMAL HIGH (ref 11.5–15.5)
Smear Review: NORMAL
WBC Count: 1.4 10*3/uL — ABNORMAL LOW (ref 4.0–10.5)
nRBC: 5.1 % — ABNORMAL HIGH (ref 0.0–0.2)

## 2021-01-25 LAB — CMP (CANCER CENTER ONLY)
ALT: 65 U/L — ABNORMAL HIGH (ref 0–44)
AST: 49 U/L — ABNORMAL HIGH (ref 15–41)
Albumin: 2.8 g/dL — ABNORMAL LOW (ref 3.5–5.0)
Alkaline Phosphatase: 87 U/L (ref 38–126)
Anion gap: 11 (ref 5–15)
BUN: 30 mg/dL — ABNORMAL HIGH (ref 8–23)
CO2: 26 mmol/L (ref 22–32)
Calcium: 9.2 mg/dL (ref 8.9–10.3)
Chloride: 102 mmol/L (ref 98–111)
Creatinine: 1.25 mg/dL — ABNORMAL HIGH (ref 0.61–1.24)
GFR, Estimated: >60 mL/min (ref >60)
Glucose, Bld: 153 mg/dL — ABNORMAL HIGH (ref 70–99)
Potassium: 3.7 mmol/L (ref 3.5–5.1)
Sodium: 139 mmol/L (ref 135–145)
Total Bilirubin: 1 mg/dL (ref 0.3–1.2)
Total Protein: 5.9 g/dL — ABNORMAL LOW (ref 6.5–8.1)

## 2021-01-25 MED ORDER — HEPARIN SOD (PORK) LOCK FLUSH 100 UNIT/ML IV SOLN
500.0000 [IU] | Freq: Once | INTRAVENOUS | Status: AC | PRN
  Administered 2021-01-25: 500 [IU]

## 2021-01-25 MED ORDER — FILGRASTIM-SNDZ 300 MCG/0.5ML IJ SOSY
300.0000 ug | PREFILLED_SYRINGE | Freq: Once | INTRAMUSCULAR | Status: AC
Start: 2021-01-25 — End: 2021-01-25
  Administered 2021-01-25: 300 ug via SUBCUTANEOUS
  Filled 2021-01-25: qty 0.5

## 2021-01-25 MED ORDER — SODIUM CHLORIDE 0.9% FLUSH
10.0000 mL | INTRAVENOUS | Status: AC | PRN
  Administered 2021-01-25: 10 mL

## 2021-01-25 MED ORDER — SODIUM CHLORIDE 0.9 % IV SOLN: Freq: Once | INTRAVENOUS | Status: DC

## 2021-01-25 MED ORDER — SODIUM CHLORIDE 0.9 % IV SOLN: 800.0000 mg/m2 | Freq: Once | INTRAVENOUS | Status: DC

## 2021-01-25 MED ORDER — SODIUM CHLORIDE 0.9% FLUSH
10.0000 mL | INTRAVENOUS | Status: DC | PRN
  Administered 2021-01-25: 10 mL

## 2021-01-25 NOTE — Patient Instructions (Signed)
Filgrastim, G-CSF injection What is this medication? FILGRASTIM, G-CSF (fil GRA stim) is a granulocyte colony-stimulating factor that stimulates the growth of neutrophils, a type of white blood cell (WBC) important in the body's fight against infection. It is used to reduce the incidence of fever and infection in patients with certain types of cancer who are receiving chemotherapy that affects the bone marrow, to stimulate blood cell production for removal of WBCs from the body prior to a bone marrow transplantation, to reduce the incidence of fever and infection in patients who have severe chronic neutropenia, and to improve survival outcomes following high-dose radiation exposure that is toxic to the bone marrow. This medicine may be used for other purposes; ask your health care provider or pharmacist if you have questions. COMMON BRAND NAME(S): Neupogen, Nivestym, Releuko, Zarxio What should I tell my care team before I take this medication? They need to know if you have any of these conditions: kidney disease latex allergy ongoing radiation therapy sickle cell disease an unusual or allergic reaction to filgrastim, pegfilgrastim, other medicines, foods, dyes, or preservatives pregnant or trying to get pregnant breast-feeding How should I use this medication? This medicine is for injection under the skin or infusion into a vein. As an infusion into a vein, it is usually given by a health care professional in a hospital or clinic setting. If you get this medicine at home, you will be taught how to prepare and give this medicine. Refer to the Instructions for Use that come with your medication packaging. Use exactly as directed. Take your medicine at regular intervals. Do not take your medicine more often than directed. It is important that you put your used needles and syringes in a special sharps container. Do not put them in a trash can. If you do not have a sharps container, call your pharmacist  or healthcare provider to get one. Talk to your pediatrician regarding the use of this medicine in children. While this drug may be prescribed for children as young as 7 months for selected conditions, precautions do apply. Overdosage: If you think you have taken too much of this medicine contact a poison control center or emergency room at once. NOTE: This medicine is only for you. Do not share this medicine with others. What if I miss a dose? It is important not to miss your dose. Call your doctor or health care professional if you miss a dose. What may interact with this medication? This medicine may interact with the following medications: medicines that may cause a release of neutrophils, such as lithium This list may not describe all possible interactions. Give your health care provider a list of all the medicines, herbs, non-prescription drugs, or dietary supplements you use. Also tell them if you smoke, drink alcohol, or use illegal drugs. Some items may interact with your medicine. What should I watch for while using this medication? Your condition will be monitored carefully while you are receiving this medicine. You may need blood work done while you are taking this medicine. Talk to your health care provider about your risk of cancer. You may be more at risk for certain types of cancer if you take this medicine. What side effects may I notice from receiving this medication? Side effects that you should report to your doctor or health care professional as soon as possible: allergic reactions like skin rash, itching or hives, swelling of the face, lips, or tongue back pain dizziness or feeling faint fever pain, redness, or   irritation at site where injected pinpoint red spots on the skin shortness of breath or breathing problems signs and symptoms of kidney injury like trouble passing urine, change in the amount of urine, or red or dark-brown urine stomach or side pain, or pain at  the shoulder swelling tiredness unusual bleeding or bruising Side effects that usually do not require medical attention (report to your doctor or health care professional if they continue or are bothersome): bone pain cough diarrhea hair loss headache muscle pain This list may not describe all possible side effects. Call your doctor for medical advice about side effects. You may report side effects to FDA at 1-800-FDA-1088. Where should I keep my medication? Keep out of the reach of children. Store in a refrigerator between 2 and 8 degrees C (36 and 46 degrees F). Do not freeze. Keep in carton to protect from light. Throw away this medicine if vials or syringes are left out of the refrigerator for more than 24 hours. Throw away any unused medicine after the expiration date. NOTE: This sheet is a summary. It may not cover all possible information. If you have questions about this medicine, talk to your doctor, pharmacist, or health care provider.  2022 Elsevier/Gold Standard (2019-04-09 18:47:55)  

## 2021-01-25 NOTE — Progress Notes (Signed)
Pt took compazine at home today around 1015A

## 2021-01-25 NOTE — Progress Notes (Signed)
I saw the patient in the infusion room today. He is neutropenic; therefore, he will not get say 8 of chemotherapy today. We will administer zarxio injections today and tomorrow. The patient often requires right sided thoracenteses for his large recurrent effusions. He had a restaging CT scan yesterday. I reviewed this with the patient. Regarding his cancer, the scan is relatively stable. However, despite having 1.7 liters removed from his right lung on 01/20/21, he continues to have a large effusion. He also had 1.8 L drawn on 12/30/20. He is not symptomatic at this time. He also has a moderate right sided effusion. I gave him several options. I could arrange for a repeat thoracentesis, refer him to pulmonary medicine to consider pleurex catheter, or both. He is interested in a pleurex. I have placed the referral. I also offered to arrange for another thoracentesis before he sees them since he has such a large volume and it is likely he will still have fluid for pleurx placement. He is not symptomatic at this time and would like to wait until he sees pulmonology. I advised him to please not wait until he is significantly short of breath to reach out to [place] if he feels like he needs a thoracentesis in the interval and cannot wait until his appointment. He expressed understanding.

## 2021-01-25 NOTE — Progress Notes (Signed)
Pt unable to receive treatment today per Dr Julien Nordmann based on [facility] of 0.5

## 2021-01-25 NOTE — Telephone Encounter (Signed)
Sch per 10/26 in pt , left msg

## 2021-01-26 ENCOUNTER — Inpatient Hospital Stay: Payer: Medicare HMO

## 2021-01-26 ENCOUNTER — Other Ambulatory Visit: Payer: Self-pay

## 2021-01-26 VITALS — BP 110/87 | HR 105 | Temp 97.8°F | Resp 17

## 2021-01-26 DIAGNOSIS — D701 Agranulocytosis secondary to cancer chemotherapy: Secondary | ICD-10-CM

## 2021-01-26 DIAGNOSIS — T451X5A Adverse effect of antineoplastic and immunosuppressive drugs, initial encounter: Secondary | ICD-10-CM

## 2021-01-26 DIAGNOSIS — Z923 Personal history of irradiation: Secondary | ICD-10-CM | POA: Diagnosis not present

## 2021-01-26 DIAGNOSIS — D709 Neutropenia, unspecified: Secondary | ICD-10-CM

## 2021-01-26 DIAGNOSIS — Z5111 Encounter for antineoplastic chemotherapy: Secondary | ICD-10-CM | POA: Diagnosis not present

## 2021-01-26 DIAGNOSIS — C3411 Malignant neoplasm of upper lobe, right bronchus or lung: Secondary | ICD-10-CM | POA: Diagnosis not present

## 2021-01-26 DIAGNOSIS — Z79899 Other long term (current) drug therapy: Secondary | ICD-10-CM | POA: Diagnosis not present

## 2021-01-26 MED ORDER — FILGRASTIM-SNDZ 300 MCG/0.5ML IJ SOSY
300.0000 ug | PREFILLED_SYRINGE | Freq: Once | INTRAMUSCULAR | Status: AC
Start: 2021-01-26 — End: 2021-01-26
  Administered 2021-01-26: 300 ug via SUBCUTANEOUS
  Filled 2021-01-26: qty 0.5

## 2021-01-31 ENCOUNTER — Encounter (HOSPITAL_BASED_OUTPATIENT_CLINIC_OR_DEPARTMENT_OTHER): Payer: Medicare HMO | Attending: Internal Medicine | Admitting: Internal Medicine

## 2021-01-31 ENCOUNTER — Other Ambulatory Visit: Payer: Self-pay

## 2021-01-31 ENCOUNTER — Encounter: Payer: Self-pay | Admitting: Family Medicine

## 2021-01-31 DIAGNOSIS — L89312 Pressure ulcer of right buttock, stage 2: Secondary | ICD-10-CM | POA: Insufficient documentation

## 2021-01-31 DIAGNOSIS — Z8673 Personal history of transient ischemic attack (TIA), and cerebral infarction without residual deficits: Secondary | ICD-10-CM | POA: Diagnosis not present

## 2021-01-31 DIAGNOSIS — Z79899 Other long term (current) drug therapy: Secondary | ICD-10-CM | POA: Insufficient documentation

## 2021-01-31 DIAGNOSIS — C3411 Malignant neoplasm of upper lobe, right bronchus or lung: Secondary | ICD-10-CM | POA: Diagnosis not present

## 2021-01-31 DIAGNOSIS — R634 Abnormal weight loss: Secondary | ICD-10-CM | POA: Insufficient documentation

## 2021-01-31 DIAGNOSIS — I10 Essential (primary) hypertension: Secondary | ICD-10-CM | POA: Insufficient documentation

## 2021-01-31 DIAGNOSIS — Z923 Personal history of irradiation: Secondary | ICD-10-CM | POA: Insufficient documentation

## 2021-01-31 DIAGNOSIS — E119 Type 2 diabetes mellitus without complications: Secondary | ICD-10-CM | POA: Insufficient documentation

## 2021-01-31 NOTE — Progress Notes (Signed)
Micheal Oliver, Micheal Oliver ([id number]) Visit Report for 01/31/2021 HPI Details Patient Name: Date of Service: Micheal Oliver, Micheal Oliver 01/31/2021 12:30 PM Medical Record Number: [id number] Patient Account Number: [id number] Date of Birth/Sex: Treating RN: 1951/03/18 (70 y.o. 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 He received radiation and is now on his third combination of keynote chemotherapy. Apparently has had limited respone to the prior to rounds of chemotherapy. As result of this he has lost 60 pounds of weight he has a marginal intake. He started to become aware of pain in his buttock area on the right several weeks ago. He asked his oncologist to look at it and they correctly call this is stage II pressure ulcer on the right buttock and referred him here. He has been using Vaseline gauze. He says the area is very painful however it is really quite small in terms of surface area Past medical history includes type 2 diabetes, hypertension, CVA 2 years ago, right upper lobe adeno CA of the lung stage IV 11/1; 2-week follow-up. Wound is smaller but still complaining of a lot of pain in the area. Electronic Signature(s) Signed: 01/31/2021 4:29:54 PM By: Linton Ham MD Entered By: Linton Ham on 01/31/2021 13:03:15 -------------------------------------------------------------------------------- Physical Exam Details Patient Name: Date of Service: Micheal Oliver, Micheal Oliver 01/31/2021 12:30 PM Medical Record Number: [id number] Patient Account Number: [id number] Date of Birth/Sex: Treating RN: [birth year] (70 y.o. [name] Primary Care Provider: Lamar Blinks Other Clinician: Referring  Provider: Treating Provider/Extender: Delton See in Treatment: 2 Constitutional Pulse regular and within target range for patient.Marland Kitchen Respirations regular, non-labored and within target range.. Temperature is normal and within the target range for the patient.Marland Kitchen Appears in no distress. Notes Wound exam; right buttock close proximity to the gluteal cleft posteriorly. The wound is smaller. Tissue looks healthy. He has a rim of nonheavy attached epithelialization distally but I did not remove this. There is no palpable tenderness around this no evidence of infection Electronic Signature(s) Signed: 01/31/2021 4:29:54 PM By: Linton Ham MD Entered By: Linton Ham on 01/31/2021 13:04:14 -------------------------------------------------------------------------------- Physician Orders Details Patient Name: Date of Service: Micheal Oliver, Micheal Oliver 01/31/2021 12:30 PM Medical Record Number: [id number] Patient Account Number: [id number] Date of Birth/Sex: Treating RN: 1950-08-07 (70 y.o. [name] Primary Care Provider: Lamar Blinks Other Clinician: Referring Provider: Treating Provider/Extender: Delton See in Treatment: 2 Verbal / Phone Orders: No Diagnosis Coding Follow-up Appointments Return Appointment in 2 weeks. Bathing/ Shower/ Hygiene May shower and wash wound with soap and water. - with dressing changes Off-Loading Turn and reposition every 2 hours - avoid sleeping in chair, Additional Orders / Instructions Follow Nutritious Diet - 1-2 Ensure per day Wound Treatment Wound #1 - Gluteus Wound Laterality: Right Cleanser: Normal Saline (Generic) Every Other Day/30 Days Discharge Instructions: Cleanse the wound with Normal Saline prior to applying a clean dressing using gauze sponges, not tissue or cotton balls. Cleanser: Soap and Water Every Other Day/30 Days Discharge Instructions: May shower and wash wound with  dial antibacterial soap and water prior to dressing change. Prim Dressing: PolyMem Non-Adhesive Dressing, 4x4 in Every Other Day/30 Days ary Discharge Instructions: Apply to wound bed as instructed Secondary Dressing: Zetuvit Plus Silicone Border Dressing 4x4 (in/in) (Generic) Every Other  Day/30 Days Discharge Instructions: Apply silicone border over primary dressing as directed. Electronic Signature(s) Signed: 01/31/2021 4:29:54 PM By: Linton Ham MD Signed: 01/31/2021 4:48:23 PM By: Rhae Hammock RN Entered By: Rhae Hammock on 01/31/2021 13:01:10 -------------------------------------------------------------------------------- Problem List Details Patient Name: Date of Service: Micheal Oliver, Micheal Oliver 01/31/2021 12:30 PM Medical Record Number: [id number] Patient Account Number: [id number] Date of Birth/Sex: Treating RN: 09/25/1950 (70 y.o. [name] Primary Care Provider: Lamar Blinks Other Clinician: Referring Provider: Treating Provider/Extender: Delton See in Treatment: 2 Active Problems ICD-10 Encounter Code Description Active Date MDM Diagnosis [phone] Pressure ulcer of right buttock, stage 2 01/16/2021 No Yes R63.4 Abnormal weight loss 01/16/2021 No Yes C34.11 Malignant neoplasm of upper lobe, right bronchus or lung 01/16/2021 No Yes Inactive Problems Resolved Problems Electronic Signature(s) Signed: 01/31/2021 4:29:54 PM By: Linton Ham MD Entered By: Linton Ham on 01/31/2021 13:02:37 -------------------------------------------------------------------------------- Progress Note Details Patient Name: Date of Service: Micheal Oliver 01/31/2021 12:30 PM Medical Record Number: [id number] Patient Account Number: [id number] Date of Birth/Sex: Treating RN: [birth year] (70 y.o. 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 He received radiation and is now on his third combination of keynote chemotherapy. Apparently has had limited respone to the prior to rounds of chemotherapy. As result of this he has lost 60 pounds of weight he has a marginal intake. He started to become aware of pain in his buttock area on the right several weeks ago. He asked his oncologist to look at it and they correctly call this is stage II pressure ulcer on the right buttock and referred him here. He has been using Vaseline gauze. He says the area is very painful however it is really quite small in terms of surface area Past medical history includes type 2 diabetes, hypertension, CVA 2 years ago, right upper lobe adeno CA of the lung stage IV 11/1; 2-week follow-up. Wound is smaller but still complaining of a lot of pain in the area. Objective Constitutional Pulse regular and within target range for patient.Marland Kitchen Respirations regular, non-labored and within target range.. Temperature is normal and within the target range for the patient.Marland Kitchen Appears in no distress. Vitals Time Taken: 12:45 PM, Height: 74 in, Weight: 165 lbs, BMI: 21.2, Temperature: 98 F, Pulse: 74 bpm, Respiratory Rate: 17 breaths/min. General Notes: Wound exam; right buttock close proximity to the gluteal cleft posteriorly. The wound is smaller. Tissue looks healthy. He has a rim of nonheavy attached epithelialization distally but I did not remove this. There is no palpable tenderness around this no evidence of infection Integumentary (Hair, Skin) Wound #1 status is Open. Original cause of wound was Pressure Injury. The date acquired was: 12/15/2020. The wound has been in treatment 2 weeks. The wound is located on the Right Gluteus. The wound measures 0.3cm  length x 0.5cm width x 0.1cm depth; 0.118cm^2 area and 0.012cm^3 volume. The wound is limited to skin breakdown. There is no tunneling or undermining noted. There is a medium amount of serosanguineous drainage noted. The wound margin is distinct with the outline attached to the wound base. There is large (67-100%) pink granulation within the wound bed. There is no necrotic tissue within the wound bed. Assessment Active Problems ICD-10 Pressure ulcer of right buttock, stage 2  Abnormal weight loss Malignant neoplasm of upper lobe, right bronchus or lung Plan Follow-up Appointments: Return Appointment in 2 weeks. Bathing/ Shower/ Hygiene: May shower and wash wound with soap and water. - with dressing changes Off-Loading: Turn and reposition every 2 hours - avoid sleeping in chair, Additional Orders / Instructions: Follow Nutritious Diet - 1-2 Ensure per day WOUND #1: - Gluteus Wound Laterality: Right Cleanser: Normal Saline (Generic) Every Other Day/30 Days Discharge Instructions: Cleanse the wound with Normal Saline prior to applying a clean dressing using gauze sponges, not tissue or cotton balls. Cleanser: Soap and Water Every Other Day/30 Days Discharge Instructions: May shower and wash wound with dial antibacterial soap and water prior to dressing change. Prim Dressing: PolyMem Non-Adhesive Dressing, 4x4 in Every Other Day/30 Days ary Discharge Instructions: Apply to wound bed as instructed Secondary Dressing: Zetuvit Plus Silicone Border Dressing 4x4 (in/in) (Generic) Every Other Day/30 Days Discharge Instructions: Apply silicone border over primary dressing as directed. #1 change the primary dressing to polymen T 2 we talked about offloading o 3. I am still not certain about the degree of pain he complains in this area. There is certainly no evidence of infection no palpable subcutaneous masses or bony tenderness around the wound area. He asked me if the pain would stop if the  wound heals and I told him I think so otherwise I am going to have to find a alternative explanation Electronic Signature(s) Signed: 01/31/2021 4:29:54 PM By: Linton Ham MD Entered By: Linton Ham on 01/31/2021 13:05:40 -------------------------------------------------------------------------------- SuperBill Details Patient Name: Date of Service: Micheal Oliver 01/31/2021 Medical Record Number: [id number] Patient Account Number: [id number] Date of Birth/Sex: Treating RN: Jan 30, 1951 (70 y.o. [name] Primary Care Provider: Lamar Blinks Other Clinician: Referring Provider: Treating Provider/Extender: Delton See in Treatment: 2 Diagnosis Coding ICD-10 Codes Code Description [phone] Pressure ulcer of right buttock, stage 2 R63.4 Abnormal weight loss C34.11 Malignant neoplasm of upper lobe, right bronchus or lung Facility Procedures CPT4 Code: [id number] Description: 13 - WOUND CARE VISIT-LEV 3 EST PT 1 Modifier: Quantity: Physician Procedures : CPT4 Code Description Modifier [id number] - WC PHYS LEVEL 3 - EST PT ICD-10 Diagnosis Description H15.056 Pressure ulcer of right buttock, stage 2 Quantity: 1 Electronic Signature(s) Signed: 01/31/2021 4:29:54 PM By: Linton Ham MD Entered By: Linton Ham on 01/31/2021 13:05:58

## 2021-01-31 NOTE — Progress Notes (Signed)
[facility] Summary Report / Loop Recorder 

## 2021-01-31 NOTE — Progress Notes (Signed)
NNAEMEKA, SAMSON ([id number]) Visit Report for 01/31/2021 Arrival Information Details Patient Name: Date of Service: OCTAVION, MOLLENKOPF 01/31/2021 12:30 PM Medical Record Number: [id number] Patient Account Number: [id number] Date of Birth/Sex: Treating RN: 06-Apr-1950 (70 y.o. [name], [name] Primary Care Jonty Morrical: Lamar Blinks Other Clinician: Referring Doyal Saric: Treating Juelle Dickmann/Extender: Delton See in Treatment: 2 Visit Information History Since Last Visit Added or deleted any medications: No Patient Arrived: Ambulatory Any new allergies or adverse reactions: No Arrival Time: 12:43 Had a fall or experienced change in No Accompanied By: self activities of daily living that may affect Transfer Assistance: None risk of falls: Patient Identification Verified: Yes Signs or symptoms of abuse/neglect since last visito No Secondary Verification Process Completed: Yes Hospitalized since last visit: No Patient Requires Transmission-Based Precautions: No Implantable device outside of the clinic excluding No Patient Has Alerts: No cellular tissue based products placed in the center since last visit: Has Dressing in Place as Prescribed: Yes Pain Present Now: Yes Electronic Signature(s) Signed: 01/31/2021 4:48:23 PM By: Rhae Hammock RN Entered By: Rhae Hammock on 01/31/2021 12:45:12 -------------------------------------------------------------------------------- Clinic Level of Care Assessment Details Patient Name: Date of Service: KAHLEB, MCCLANE 01/31/2021 12:30 PM Medical Record Number: [id number] Patient Account Number: [id number] Date of Birth/Sex: Treating RN: [birth year] (70 y.o. [name], [name] Primary Care Dusan Lipford: Lamar Blinks Other Clinician: Referring Antonietta Lansdowne: Treating Ivylynn Hoppes/Extender: Delton See in Treatment: 2 Clinic Level of Care Assessment Items TOOL 4 Quantity Score X- 1 0 Use when  only an EandM is performed on FOLLOW-UP visit ASSESSMENTS - Nursing Assessment / Reassessment X- 1 10 Reassessment of Co-morbidities (includes updates in patient status) X- 1 5 Reassessment of Adherence to Treatment Plan ASSESSMENTS - Wound and Skin A ssessment / Reassessment X - Simple Wound Assessment / Reassessment - one wound 1 5 []  - 0 Complex Wound Assessment / Reassessment - multiple wounds []  - 0 Dermatologic / Skin Assessment (not related to wound area) ASSESSMENTS - Focused Assessment []  - 0 Circumferential Edema Measurements - multi extremities []  - 0 Nutritional Assessment / Counseling / Intervention []  - 0 Lower Extremity Assessment (monofilament, tuning fork, pulses) []  - 0 Peripheral Arterial Disease Assessment (using hand held doppler) ASSESSMENTS - Ostomy and/or Continence Assessment and Care []  - 0 Incontinence Assessment and Management []  - 0 Ostomy Care Assessment and Management (repouching, etc.) PROCESS - Coordination of Care X - Simple Patient / Family Education for ongoing care 1 15 []  - 0 Complex (extensive) Patient / Family Education for ongoing care X- 1 10 Staff obtains Programmer, systems, Records, T Results / Process Orders est []  - 0 Staff telephones HHA, Nursing Homes / Clarify orders / etc []  - 0 Routine Transfer to another Facility (non-emergent condition) []  - 0 Routine Hospital Admission (non-emergent condition) []  - 0 New Admissions / Biomedical engineer / Ordering NPWT Apligraf, etc. , []  - 0 Emergency Hospital Admission (emergent condition) X- 1 10 Simple Discharge Coordination []  - 0 Complex (extensive) Discharge Coordination PROCESS - Special Needs []  - 0 Pediatric / Minor Patient Management []  - 0 Isolation Patient Management []  - 0 Hearing / Language / Visual special needs []  - 0 Assessment of Community assistance (transportation, D/C planning, etc.) []  - 0 Additional assistance / Altered mentation []  - 0 Support  Surface(s) Assessment (bed, cushion, seat, etc.) INTERVENTIONS - Wound Cleansing / Measurement X - Simple Wound Cleansing - one wound 1 5 []  - 0 Complex Wound Cleansing - multiple wounds X- 1 5 Wound  Imaging (photographs - any number of wounds) []  - 0 Wound Tracing (instead of photographs) X- 1 5 Simple Wound Measurement - one wound []  - 0 Complex Wound Measurement - multiple wounds INTERVENTIONS - Wound Dressings X - Small Wound Dressing one or multiple wounds 1 10 []  - 0 Medium Wound Dressing one or multiple wounds []  - 0 Large Wound Dressing one or multiple wounds []  - 0 Application of Medications - topical []  - 0 Application of Medications - injection INTERVENTIONS - Miscellaneous []  - 0 External ear exam []  - 0 Specimen Collection (cultures, biopsies, blood, body fluids, etc.) []  - 0 Specimen(s) / Culture(s) sent or taken to Lab for analysis []  - 0 Patient Transfer (multiple staff / Civil Service fast streamer / Similar devices) []  - 0 Simple Staple / Suture removal (25 or less) []  - 0 Complex Staple / Suture removal (26 or more) []  - 0 Hypo / Hyperglycemic Management (close monitor of Blood Glucose) []  - 0 Ankle / Brachial Index (ABI) - do not check if billed separately X- 1 5 Vital Signs Has the patient been seen at the hospital within the last three years: Yes Total Score: 85 Level Of Care: New/Established - Level 3 Electronic Signature(s) Signed: 01/31/2021 4:48:23 PM By: Rhae Hammock RN Entered By: Rhae Hammock on 01/31/2021 13:03:53 -------------------------------------------------------------------------------- Encounter Discharge Information Details Patient Name: Date of Service: Jonah Blue M 01/31/2021 12:30 PM Medical Record Number: [id number] Patient Account Number: [id number] Date of Birth/Sex: Treating RN: 05-10-1950 (70 y.o. [name] Primary Care Yonathan Perrow: Lamar Blinks Other Clinician: Referring Natesha Hassey: Treating  Tersa Fotopoulos/Extender: Delton See in Treatment: 2 Encounter Discharge Information Items Discharge Condition: Stable Ambulatory Status: Ambulatory Discharge Destination: Home Transportation: Private Auto Accompanied By: self Schedule Follow-up Appointment: Yes Clinical Summary of Care: Patient Declined Electronic Signature(s) Signed: 01/31/2021 4:48:23 PM By: Rhae Hammock RN Entered By: Rhae Hammock on 01/31/2021 13:04:22 -------------------------------------------------------------------------------- Lower Extremity Assessment Details Patient Name: Date of Service: JAKEL, ALPHIN 01/31/2021 12:30 PM Medical Record Number: [id number] Patient Account Number: [id number] Date of Birth/Sex: Treating RN: [birth year] (70 y.o. [name] Primary Care [place]: Lamar Blinks Other Clinician: Referring Dell Briner: Treating Britanee Vanblarcom/Extender: Delton See in Treatment: 2 Electronic Signature(s) Signed: 01/31/2021 4:48:23 PM By: Rhae Hammock RN Entered By: Rhae Hammock on 01/31/2021 12:46:04 -------------------------------------------------------------------------------- Multi Wound Chart Details Patient Name: Date of Service: YUVAN, MEDINGER 01/31/2021 12:30 PM Medical Record Number: [id number] Patient Account Number: [id number] Date of Birth/Sex: Treating RN: [birth year] (70 y.o. [name] Primary Care Raphael Espe: Lamar Blinks Other Clinician: Referring Alee Gressman: Treating Kennesha Brewbaker/Extender: Delton See in Treatment: 2 Vital Signs Height(in): 74 Pulse(bpm): 91 Weight(lbs): 165 Blood Pressure(mmHg): Body Mass Index(BMI): 21 Temperature(F): 98 Respiratory Rate(breaths/min): 17 Photos: [N/A:N/A] Right Gluteus N/A N/A Wound Location: Pressure Injury N/A N/A Wounding Event: Pressure Ulcer N/A N/A Primary Etiology: Arrhythmia, Hypertension, Type II N/A  N/A Comorbid History: Diabetes, Received Chemotherapy, Received Radiation, Anorexia/bulimia 12/15/2020 N/A N/A Date Acquired: 2 N/A N/A Weeks of Treatment: Open N/A N/A Wound Status: 0.3x0.5x0.1 N/A N/A Measurements L x W x D (cm) 0.118 N/A N/A A (cm) : rea 0.012 N/A N/A Volume (cm) : 81.20% N/A N/A % Reduction in A rea: 81.00% N/A N/A % Reduction in Volume: Category/Stage II N/A N/A Classification: Medium N/A N/A Exudate A mount: Serosanguineous N/A N/A Exudate Type: red, brown N/A N/A Exudate Color: Distinct, outline attached N/A N/A Wound Margin: Large (67-100%) N/A N/A Granulation A mount: Pink N/A N/A Granulation Quality:  None Present (0%) N/A N/A Necrotic A mount: Fascia: No N/A N/A Exposed Structures: Fat Layer (Subcutaneous Tissue): No Tendon: No Muscle: No Joint: No Bone: No Limited to Skin Breakdown Large (67-100%) N/A N/A Epithelialization: Treatment Notes Wound #1 (Gluteus) Wound Laterality: Right Cleanser Normal Saline Discharge Instruction: Cleanse the wound with Normal Saline prior to applying a clean dressing using gauze sponges, not tissue or cotton balls. Soap and Water Discharge Instruction: May shower and wash wound with dial antibacterial soap and water prior to dressing change. Peri-Wound Care Topical Primary Dressing Promogran Prisma Matrix, 4.34 (sq in) (silver collagen) Discharge Instruction: Moisten collagen with saline or hydrogel Secondary Dressing Zetuvit Plus Silicone Border Dressing 4x4 (in/in) Discharge Instruction: Apply silicone border over primary dressing as directed. Secured With Compression Wrap Compression Stockings Environmental education officer) Signed: 01/31/2021 4:29:54 PM By: Linton Ham MD Signed: 01/31/2021 4:48:23 PM By: Rhae Hammock RN Entered By: Linton Ham on 01/31/2021 13:02:47 -------------------------------------------------------------------------------- Multi-Disciplinary Care  Plan Details Patient Name: Date of Service: DAUNTAE, DERUSHA 01/31/2021 12:30 PM Medical Record Number: [id number] Patient Account Number: [id number] Date of Birth/Sex: Treating RN: 1950-04-09 (71 y.o. [name], [name] Primary Care Reyanna Baley: Lamar Blinks Other Clinician: Referring Mykelti Goldenstein: Treating Daryn Pisani/Extender: Delton See in Treatment: 2 Multidisciplinary Care Plan reviewed with physician Active Inactive Nutrition Nursing Diagnoses: Imbalanced nutrition Potential for alteratiion in Nutrition/Potential for imbalanced nutrition Goals: Patient/caregiver will maintain therapeutic glucose control Date Initiated: 01/16/2021 Target Resolution Date: 02/13/2021 Goal Status: Active Interventions: Assess HgA1c results as ordered upon admission and as needed Assess patient nutrition upon admission and as needed per policy Provide education on nutrition Treatment Activities: Dietary management education, guidance and counseling : 01/16/2021 Education provided on Nutrition : 01/31/2021 Patient referred to Primary Care Physician for further nutritional evaluation : 01/16/2021 Notes: Pressure Nursing Diagnoses: Knowledge deficit related to causes and risk factors for pressure ulcer development Knowledge deficit related to management of pressures ulcers Potential for impaired tissue integrity related to pressure, friction, moisture, and shear Goals: Patient/caregiver will verbalize understanding of pressure ulcer management Date Initiated: 01/16/2021 Target Resolution Date: 02/13/2021 Goal Status: Active Interventions: Assess offloading mechanisms upon admission and as needed Assess potential for pressure ulcer upon admission and as needed Provide education on pressure ulcers Notes: Wound/Skin Impairment Nursing Diagnoses: Impaired tissue integrity Knowledge deficit related to ulceration/compromised skin integrity Goals: Patient/caregiver  will verbalize understanding of skin care regimen Date Initiated: 01/16/2021 Target Resolution Date: 02/13/2021 Goal Status: Active Ulcer/skin breakdown will have a volume reduction of 30% by week 4 Date Initiated: 01/16/2021 Target Resolution Date: 02/13/2021 Goal Status: Active Interventions: Assess patient/caregiver ability to obtain necessary supplies Assess patient/caregiver ability to perform ulcer/skin care regimen upon admission and as needed Assess ulceration(s) every visit Provide education on ulcer and skin care Treatment Activities: Skin care regimen initiated : 01/16/2021 Topical wound management initiated : 01/16/2021 Notes: Electronic Signature(s) Signed: 01/31/2021 4:48:23 PM By: Rhae Hammock RN Entered By: Rhae Hammock on 01/31/2021 13:02:31 -------------------------------------------------------------------------------- Pain Assessment Details Patient Name: Date of Service: ANGELES, PAOLUCCI 01/31/2021 12:30 PM Medical Record Number: [id number] Patient Account Number: [id number] Date of Birth/Sex: Treating RN: [birth year] (70 y.o. [name] Primary Care Elyce Zollinger: Lamar Blinks Other Clinician: Referring Kimanh Templeman: Treating [place]/Extender: Delton See in Treatment: 2 Active Problems Location of Pain Severity and Description of Pain Patient Has Paino Yes Site Locations Pain Location: Pain in Ulcers With Dressing Change: Yes Duration of the Pain. Constant / Intermittento Constant Rate the pain. Current Pain Level: 5  Worst Pain Level: 10 Least Pain Level: 0 Tolerable Pain Level: 5 Character of Pain Describe the Pain: Aching Pain Management and Medication Current Pain Management: Medication: No Cold Application: No Rest: No Massage: No Activity: No T.E.N.S.: No Heat Application: No Leg drop or elevation: No Is the Current Pain Management Adequate: Adequate How does your wound impact your  activities of daily livingo Sleep: No Bathing: No Appetite: No Relationship With Others: No Bladder Continence: No Emotions: No Bowel Continence: No Work: No Toileting: No Drive: No Dressing: No Hobbies: No Electronic Signature(s) Signed: 01/31/2021 4:48:23 PM By: Rhae Hammock RN Entered By: Rhae Hammock on 01/31/2021 12:45:51 -------------------------------------------------------------------------------- Patient/Caregiver Education Details Patient Name: Date of Service: Clance Boll 11/1/2022andnbsp12:30 PM Medical Record Number: [id number] Patient Account Number: [id number] Date of Birth/Gender: Treating RN: 20-Mar-1951 (70 y.o. [name] Primary Care Physician: Lamar Blinks Other Clinician: Referring Physician: Treating Physician/Extender: Delton See in Treatment: 2 Education Assessment Education Provided To: Patient Education Topics Provided Nutrition: Methods: Explain/Verbal Responses: Reinforcements needed, State content correctly Electronic Signature(s) Signed: 01/31/2021 4:48:23 PM By: Rhae Hammock RN Entered By: Rhae Hammock on 01/31/2021 13:03:21 -------------------------------------------------------------------------------- Wound Assessment Details Patient Name: Date of Service: KASPER, MUDRICK 01/31/2021 12:30 PM Medical Record Number: [id number] Patient Account Number: [id number] Date of Birth/Sex: Treating RN: 10-18-1950 (70 y.o. [name], [name] Primary Care Deunte Bledsoe: Lamar Blinks Other Clinician: Referring Gedeon Brandow: Treating Kaye Luoma/Extender: Delton See in Treatment: 2 Wound Status Wound Number: 1 Primary Pressure Ulcer Etiology: Wound Location: Right Gluteus Wound Open Wounding Event: Pressure Injury Status: Date Acquired: 12/15/2020 Date Acquired: 12/15/2020 Comorbid Arrhythmia, Hypertension, Type II Diabetes, Received Weeks Of  Treatment: 2 History: Chemotherapy, Received Radiation, Anorexia/bulimia Clustered Wound: No Photos Wound Measurements Length: (cm) 0.3 Width: (cm) 0.5 Depth: (cm) 0.1 Area: (cm) 0.118 Volume: (cm) 0.012 % Reduction in Area: 81.2% % Reduction in Volume: 81% Epithelialization: Large (67-100%) Tunneling: No Undermining: No Wound Description Classification: Category/Stage II Wound Margin: Distinct, outline attached Exudate Amount: Medium Exudate Type: Serosanguineous Exudate Color: red, brown Foul Odor After Cleansing: No Slough/Fibrino No Wound Bed Granulation Amount: Large (67-100%) Exposed Structure Granulation Quality: Pink Fascia Exposed: No Necrotic Amount: None Present (0%) Fat Layer (Subcutaneous Tissue) Exposed: No Tendon Exposed: No Muscle Exposed: No Joint Exposed: No Bone Exposed: No Limited to Skin Breakdown Treatment Notes Wound #1 (Gluteus) Wound Laterality: Right Cleanser Normal Saline Discharge Instruction: Cleanse the wound with Normal Saline prior to applying a clean dressing using gauze sponges, not tissue or cotton balls. Soap and Water Discharge Instruction: May shower and wash wound with dial antibacterial soap and water prior to dressing change. Peri-Wound Care Topical Primary Dressing Promogran Prisma Matrix, 4.34 (sq in) (silver collagen) Discharge Instruction: Moisten collagen with saline or hydrogel Secondary Dressing Zetuvit Plus Silicone Border Dressing 4x4 (in/in) Discharge Instruction: Apply silicone border over primary dressing as directed. Secured With Compression Wrap Compression Stockings Environmental education officer) Signed: 01/31/2021 4:48:23 PM By: Rhae Hammock RN Entered By: Rhae Hammock on 01/31/2021 12:52:21 -------------------------------------------------------------------------------- Vitals Details Patient Name: Date of Service: Jonah Blue M 01/31/2021 12:30 PM Medical Record Number:  [id number] Patient Account Number: [id number] Date of Birth/Sex: Treating RN: 15-Jun-1950 (70 y.o. [name], [name] Primary Care Camelia Stelzner: Lamar Blinks Other Clinician: Referring Lemon Whitacre: Treating Cochise Dinneen/Extender: Delton See in Treatment: 2 Vital Signs Time Taken: 12:45 Temperature (F): 98 Height (in): 74 Pulse (bpm): 74 Weight (lbs): 165 Respiratory Rate (breaths/min): 17 Body Mass Index (BMI): 21.2 Reference Range: 80 - 120  mg / dl Electronic Signature(s) Signed: 01/31/2021 4:48:23 PM By: Rhae Hammock RN Entered By: Rhae Hammock on 01/31/2021 12:45:29

## 2021-02-01 ENCOUNTER — Ambulatory Visit: Payer: Medicare HMO

## 2021-02-01 ENCOUNTER — Other Ambulatory Visit: Payer: Medicare HMO

## 2021-02-01 ENCOUNTER — Other Ambulatory Visit: Payer: Self-pay | Admitting: Surgery

## 2021-02-01 DIAGNOSIS — E041 Nontoxic single thyroid nodule: Secondary | ICD-10-CM

## 2021-02-02 ENCOUNTER — Other Ambulatory Visit: Payer: Self-pay

## 2021-02-02 ENCOUNTER — Telehealth: Payer: Self-pay | Admitting: Family Medicine

## 2021-02-02 ENCOUNTER — Telehealth: Payer: Self-pay | Admitting: Physician Assistant

## 2021-02-02 ENCOUNTER — Other Ambulatory Visit: Payer: Self-pay | Admitting: Physician Assistant

## 2021-02-02 DIAGNOSIS — J90 Pleural effusion, not elsewhere classified: Secondary | ICD-10-CM

## 2021-02-02 NOTE — Telephone Encounter (Signed)
Called pt to check on him-he notes that his urinary symptoms are better, they are not an acute issue right now.  He is seeing pulmonology next week and may have a permanent pulmonary catheter placed to deal with his effusions.  We discussed a hospice consultation, he is not ready for this right now but we will keep it in mind

## 2021-02-02 NOTE — Telephone Encounter (Signed)
I called the patient to follow up on his recurrent effusions. He has a moderate left and large right effusion. He stated as long as he is resting he is not short of breath but with any activity he is short of breath. I referred him to get a pleurex catheter but it sounds like he may need another thoracentesis soon per his mychart message to his PCP. I called him to discuss. I will place the order for a US thoracentesis and will follow up on the referral to pulmonology.

## 2021-02-03 ENCOUNTER — Ambulatory Visit (HOSPITAL_COMMUNITY)
Admission: RE | Admit: 2021-02-03 | Discharge: 2021-02-03 | Disposition: A | Discharge status: Home / Self Care | Payer: Medicare HMO | Source: Ambulatory Visit | Attending: Radiology | Admitting: Radiology

## 2021-02-03 ENCOUNTER — Other Ambulatory Visit (HOSPITAL_COMMUNITY): Payer: Self-pay | Admitting: Radiology

## 2021-02-03 ENCOUNTER — Other Ambulatory Visit: Payer: Self-pay

## 2021-02-03 ENCOUNTER — Ambulatory Visit (HOSPITAL_COMMUNITY)
Admission: RE | Admit: 2021-02-03 | Discharge: 2021-02-03 | Disposition: A | Discharge status: Home / Self Care | Payer: Medicare HMO | Source: Ambulatory Visit | Attending: Physician Assistant | Admitting: Physician Assistant

## 2021-02-03 DIAGNOSIS — J90 Pleural effusion, not elsewhere classified: Secondary | ICD-10-CM

## 2021-02-03 DIAGNOSIS — Z48813 Encounter for surgical aftercare following surgery on the respiratory system: Secondary | ICD-10-CM | POA: Diagnosis not present

## 2021-02-03 DIAGNOSIS — C3490 Malignant neoplasm of unspecified part of unspecified bronchus or lung: Secondary | ICD-10-CM | POA: Diagnosis not present

## 2021-02-03 DIAGNOSIS — R918 Other nonspecific abnormal finding of lung field: Secondary | ICD-10-CM | POA: Diagnosis not present

## 2021-02-03 HISTORY — PX: IR THORACENTESIS ASP PLEURAL SPACE W/IMG GUIDE: IMG5380

## 2021-02-03 MED ORDER — LIDOCAINE HCL 1 % IJ SOLN
INTRAMUSCULAR | Status: AC
  Filled 2021-02-03: qty 20

## 2021-02-03 MED ORDER — LIDOCAINE HCL (PF) 1 % IJ SOLN
INTRAMUSCULAR | Status: DC | PRN
  Administered 2021-02-03: 10 mL

## 2021-02-03 NOTE — Procedures (Signed)
Ultrasound-guided therapeutic right thoracentesis performed yielding 1.45 liters of straw colored fluid. No immediate complications. Follow-up chest x-ray pending. EBL is < 2 ml.

## 2021-02-08 ENCOUNTER — Inpatient Hospital Stay: Payer: Medicare HMO | Attending: Internal Medicine | Admitting: Internal Medicine

## 2021-02-08 ENCOUNTER — Inpatient Hospital Stay: Payer: Medicare HMO

## 2021-02-08 ENCOUNTER — Other Ambulatory Visit: Payer: Self-pay

## 2021-02-08 VITALS — BP 119/86 | HR 95 | Temp 97.5°F | Resp 19 | Ht 74.0 in | Wt 157.6 lb

## 2021-02-08 DIAGNOSIS — C3411 Malignant neoplasm of upper lobe, right bronchus or lung: Secondary | ICD-10-CM

## 2021-02-08 DIAGNOSIS — Z95828 Presence of other vascular implants and grafts: Secondary | ICD-10-CM

## 2021-02-08 DIAGNOSIS — Z5111 Encounter for antineoplastic chemotherapy: Secondary | ICD-10-CM | POA: Insufficient documentation

## 2021-02-08 DIAGNOSIS — Z79899 Other long term (current) drug therapy: Secondary | ICD-10-CM | POA: Insufficient documentation

## 2021-02-08 DIAGNOSIS — D701 Agranulocytosis secondary to cancer chemotherapy: Secondary | ICD-10-CM

## 2021-02-08 DIAGNOSIS — Z923 Personal history of irradiation: Secondary | ICD-10-CM | POA: Insufficient documentation

## 2021-02-08 DIAGNOSIS — Z23 Encounter for immunization: Secondary | ICD-10-CM | POA: Insufficient documentation

## 2021-02-08 DIAGNOSIS — T451X5A Adverse effect of antineoplastic and immunosuppressive drugs, initial encounter: Secondary | ICD-10-CM | POA: Diagnosis not present

## 2021-02-08 LAB — CMP (CANCER CENTER ONLY)
ALT: 22 U/L (ref 0–44)
AST: 25 U/L (ref 15–41)
Albumin: 3 g/dL — ABNORMAL LOW (ref 3.5–5.0)
Alkaline Phosphatase: 84 U/L (ref 38–126)
Anion gap: 9 (ref 5–15)
BUN: 29 mg/dL — ABNORMAL HIGH (ref 8–23)
CO2: 25 mmol/L (ref 22–32)
Calcium: 8.9 mg/dL (ref 8.9–10.3)
Chloride: 104 mmol/L (ref 98–111)
Creatinine: 0.95 mg/dL (ref 0.61–1.24)
GFR, Estimated: >60 mL/min (ref >60)
Glucose, Bld: 102 mg/dL — ABNORMAL HIGH (ref 70–99)
Potassium: 4.6 mmol/L (ref 3.5–5.1)
Sodium: 138 mmol/L (ref 135–145)
Total Bilirubin: 0.6 mg/dL (ref 0.3–1.2)
Total Protein: 6.2 g/dL — ABNORMAL LOW (ref 6.5–8.1)

## 2021-02-08 LAB — CBC WITH DIFFERENTIAL (CANCER CENTER ONLY)
Abs Immature Granulocytes: 0.07 10*3/uL (ref 0.00–0.07)
Basophils Absolute: 0.1 10*3/uL (ref 0.0–0.1)
Basophils Relative: 1 %
Eosinophils Absolute: 0.1 10*3/uL (ref 0.0–0.5)
Eosinophils Relative: 1 %
HCT: 32 % — ABNORMAL LOW (ref 39.0–52.0)
Hemoglobin: 10.7 g/dL — ABNORMAL LOW (ref 13.0–17.0)
Immature Granulocytes: 1 %
Lymphocytes Relative: 6 %
Lymphs Abs: 0.6 10*3/uL — ABNORMAL LOW (ref 0.7–4.0)
MCH: 36.6 pg — ABNORMAL HIGH (ref 26.0–34.0)
MCHC: 33.4 g/dL (ref 30.0–36.0)
MCV: 109.6 fL — ABNORMAL HIGH (ref 80.0–100.0)
Monocytes Absolute: 1.1 10*3/uL — ABNORMAL HIGH (ref 0.1–1.0)
Monocytes Relative: 11 %
Neutro Abs: 8.5 10*3/uL — ABNORMAL HIGH (ref 1.7–7.7)
Neutrophils Relative %: 80 %
Platelet Count: 385 10*3/uL (ref 150–400)
RBC: 2.92 MIL/uL — ABNORMAL LOW (ref 4.22–5.81)
RDW: 19.3 % — ABNORMAL HIGH (ref 11.5–15.5)
WBC Count: 10.5 10*3/uL (ref 4.0–10.5)
nRBC: 0 % (ref 0.0–0.2)

## 2021-02-08 MED ORDER — SODIUM CHLORIDE 0.9% FLUSH: 10.0000 mL | INTRAVENOUS | Status: DC | PRN

## 2021-02-08 MED ORDER — SODIUM CHLORIDE 0.9 % IV SOLN: Freq: Once | INTRAVENOUS | Status: AC

## 2021-02-08 MED ORDER — SODIUM CHLORIDE 0.9% FLUSH
10.0000 mL | INTRAVENOUS | Status: DC | PRN
  Administered 2021-02-08: 10 mL via INTRAVENOUS

## 2021-02-08 MED ORDER — SODIUM CHLORIDE 0.9 % IV SOLN
800.0000 mg/m2 | Freq: Once | INTRAVENOUS | Status: AC
  Administered 2021-02-08: 1634 mg via INTRAVENOUS
  Filled 2021-02-08: qty 42.98

## 2021-02-08 MED ORDER — PROCHLORPERAZINE MALEATE 10 MG PO TABS: 10.0000 mg | ORAL_TABLET | Freq: Once | ORAL | Status: DC

## 2021-02-08 MED ORDER — HEPARIN SOD (PORK) LOCK FLUSH 100 UNIT/ML IV SOLN: 500.0000 [IU] | Freq: Once | INTRAVENOUS | Status: DC | PRN

## 2021-02-08 NOTE — Progress Notes (Signed)
[place] [phone]   Fax:([phone]) [phone]  OFFICE PROGRESS NOTE  Copland, Gay Filler, MD [address] [place] [address]  DIAGNOSIS: Stage IV (T3, N2, M1a) non-small cell lung cancer, adenocarcinoma presented with large right upper lobe lung mass extending to the right suprahilar region in addition to right hilar and mediastinal lymphadenopathy with obstruction of the associated upper lobe bronchus and bilateral subcentimeter pulmonary nodules diagnosed in July 2021.  Biomarker Findings Microsatellite status - MS-Stable Tumor Mutational Burden - 3 Muts/Mb Genomic Findings For a complete list of the genes assayed, please refer to the Appendix. ERBB2 [id number], amplification - equivocal? HGF amplification - equivocal? MTAP loss exons 5-8 MYC amplification - equivocal? CDKN2A/B CDKN2B loss, CDKN2A loss CUL3 Y66f*19 RPTOR amplification TP53 [id number] Disease relevant genes with no reportable alterations: ALK, BRAF, EGFR, KRAS, MET, RET, ROS1  PDL1 Expression: Negative  PRIOR THERAPY:  1) Palliative radiotherapy to the obstructive lung mass under the care of Dr. MoLisbeth RenshawThe past treatment was scheduled for 11/23/19. 2) Systemic chemotherapy with carboplatin for AUC of 5, Alimta 500 mg/M2 and Keytruda 200 mg IV every 3 weeks.  First dose November 04, 2019.  Status post 9 cycles.  Starting from cycle #5 he will be on maintenance treatment with Alimta and Keytruda every 3 weeks. 3) Second line systemic chemotherapy with docetaxel 75 mg/M2 and Cyramza 10 mg/KG every weeks with Neulasta support.  First dose May 18, 2020.  Status post 2 cycles.  Last dose was given June 17, 2020 discontinued secondary to disease progression.  CURRENT THERAPY: Third line systemic chemotherapy with single agent gemcitabine 1000 mg/M2 on days 1 and 8 every 3 weeks.  First dose July 13, 2020.  Status post 10 cycles.  Starting from cycle #7 his dose  of gemcitabine was reduced to 800 Mg/M2 secondary to chemotherapy-induced neutropenia.  INTERVAL HISTORY: [name] male returns to the clinic today for follow-up visit.  The patient is feeling fine today with no concerning complaints except for the generalized fatigue and weakness.  He had ultrasound-guided right-sided thoracentesis on January 31, 2021 with drainage of 1.45 L of fluid.  He felt a little bit better after the procedure.  He was also referred to pulmonary medicine for consideration of Pleurx catheter placement.  The patient denied having any current chest pain but has shortness of breath with exertion with no cough or hemoptysis.  He has no nausea, vomiting, diarrhea or constipation.  He has no headache or visual changes.  He denied having any fever or chills.  He missed day 8 of cycle #10 because of neutropenia. The patient is here today for evaluation before starting cycle #11.  MEDICAL HISTORY: Past Medical History:  Diagnosis Date   Chicken pox    [facility] [place] 2[place]   History of kidney stones    passed several    Hypertension    Kidney stones    5-6 times   lung ca 10/2019   Measles    Mumps    [place]   [place] [facility], last one was 09/18/2019- has some expressive aphasia    ALLERGIES:  is allergic to bee venom.  MEDICATIONS:  Current Outpatient Medications  Medication Sig Dispense Refill   albuterol (VENTOLIN HFA) 108 (90 Base) MCG/ACT inhaler Inhale 2 puffs into the lungs every 6 (six) hours as needed for wheezing or shortness of breath. 8 g 2  apixaban (ELIQUIS) 5 MG TABS tablet Take 1 tablet (5 mg total) by mouth 2 (two) times daily. 60 tablet 11   atorvastatin (LIPITOR) 40 MG tablet TAKE ONE TABLET BY MOUTH ONE TIME DAILY 6PM 90 tablet 3   diltiazem (CARDIZEM CD) 180 MG 24 hr capsule Take 1 capsule (180 mg total) by mouth daily. 30 capsule 6   diphenhydrAMINE HCl, Sleep, (ZZZQUIL) 25 MG CAPS Take 25 mg by mouth at bedtime.      HYDROcodone bit-homatropine (HYCODAN) 5-1.5 MG/5ML syrup Take 5 mLs by mouth every 6 (six) hours as needed for cough. 473 mL 0   lidocaine-prilocaine (EMLA) cream Apply to the Port-A-Cath site 30-60-minute before treatment 30 g 0   metFORMIN (GLUCOPHAGE) 500 MG tablet TAKE TWO TABLETS BY MOUTH TWICE A DAY WITH MEAL(S) 400 tablet 3   methylPREDNISolone (MEDROL DOSEPAK) 4 MG TBPK tablet Use as instructed. 21 tablet 0   Omega-3 Fatty Acids (FISH OIL) 1200 MG CAPS Take 1,200 mg by mouth daily.     prochlorperazine (COMPAZINE) 10 MG tablet Take 1 tablet (10 mg total) by mouth every 6 (six) hours as needed. 30 tablet 2   No current facility-administered medications for this visit.   Facility-Administered Medications Ordered in Other Visits  Medication Dose Route Frequency Provider Last Rate Last Admin   sodium chloride flush (NS) 0.9 % injection 10 mL  10 mL Intravenous PRN Curt Bears, MD   10 mL at 02/08/21 1402    SURGICAL HISTORY:  Past Surgical History:  Procedure Laterality Date   COLONOSCOPY W/ POLYPECTOMY     IR IMAGING GUIDED PORT INSERTION  12/12/2020   IR THORACENTESIS ASP PLEURAL SPACE W/IMG GUIDE  02/03/2021   VIDEO BRONCHOSCOPY WITH ENDOBRONCHIAL ULTRASOUND N/A 09/30/2019   Procedure: VIDEO BRONCHOSCOPY WITH ENDOBRONCHIAL ULTRASOUND;  Surgeon: Collene Gobble, MD;  Location: [facility];  Service: Thoracic;  Laterality: N/A;   WISDOM TOOTH EXTRACTION  1976-77    REVIEW OF SYSTEMS:  A comprehensive review of systems was negative except for: Constitutional: positive for fatigue Respiratory: positive for dyspnea on exertion Musculoskeletal: positive for muscle weakness   PHYSICAL EXAMINATION: General appearance: alert, cooperative, fatigued, and no distress Head: Normocephalic, without obvious abnormality, atraumatic, Right eyelid droop Neck: no adenopathy, no JVD, supple, symmetrical, trachea midline, and thyroid not enlarged, symmetric, no tenderness/mass/nodules Lymph nodes:  Cervical, supraclavicular, and axillary nodes normal. Resp: clear to auscultation bilaterally Back: symmetric, no curvature. ROM normal. No CVA tenderness. Cardio: regular rate and rhythm, S1, S2 normal, no murmur, click, rub or gallop GI: soft, non-tender; bowel sounds normal; no masses,  no organomegaly Extremities: extremities normal, atraumatic, no cyanosis or edema  ECOG PERFORMANCE STATUS: 1 - Symptomatic but completely ambulatory  Blood pressure 119/86, pulse 95, temperature (!) 97.5 F (36.4 C), temperature source Tympanic, resp. rate 19, height 6' 2"  (1.88 m), weight 157 lb 9.6 oz (71.5 kg), SpO2 97 %.  LABORATORY DATA: Lab Results  Component Value Date   WBC 1.4 (L) 01/25/2021   HGB 9.2 (L) 01/25/2021   HCT 26.7 (L) 01/25/2021   MCV 104.7 (H) 01/25/2021   PLT 306 01/25/2021      Chemistry      Component Value Date/Time   NA 139 01/25/2021 1123   K 3.7 01/25/2021 1123   CL 102 01/25/2021 1123   CO2 26 01/25/2021 1123   BUN 30 (H) 01/25/2021 1123   CREATININE 1.25 (H) 01/25/2021 1123   CREATININE 1.13 10/16/2013 1118      Component  Value Date/Time   CALCIUM 9.2 01/25/2021 1123   ALKPHOS 87 01/25/2021 1123   AST 49 (H) 01/25/2021 1123   ALT 65 (H) 01/25/2021 1123   BILITOT 1.0 01/25/2021 1123       RADIOGRAPHIC STUDIES: DG Chest 1 View  Result Date: 02/03/2021 CLINICAL DATA:  s/p right thoracentesis EXAM: CHEST  1 VIEW COMPARISON:  January 20, 2021 FINDINGS: The cardiomediastinal silhouette is unchanged in contour.RIGHT chest port with tip terminating over the RIGHT atrium. Cardiac loop recorder. Moderate RIGHT pleural effusion. Small LEFT pleural effusion. No significant pneumothorax. Revisualization of a RIGHT paramediastinal mass and multiple bilateral pulmonary nodules consistent with known malignancy. Bibasilar homogeneous opacification most consistent with atelectasis. Visualized abdomen is unremarkable. IMPRESSION: Moderate RIGHT and small LEFT pleural  effusion. Electronically Signed   By: Valentino Saxon M.D.   On: 02/03/2021 13:45   DG Chest 1 View  Result Date: 01/20/2021 CLINICAL DATA:  Post right-sided thoracentesis EXAM: CHEST  1 VIEW COMPARISON:  12/30/2020; 12/12/2020; chest CT-11/14/2020 FINDINGS: Grossly unchanged cardiac silhouette and mediastinal contours with partial obscuration of right heart border secondary to moderate sized partially loculated right-sided effusion and associated right basilar heterogeneous/consolidative opacities, similar to the 12/30/2020 examination. Stable position of support apparatus. Redemonstrated masslike thickening involving the right hilum with scattered bilateral pulmonary nodules compatible with known malignancy and metastatic disease. Unchanged small left-sided effusion and associated left basilar heterogeneous opacities. Scattered bilateral pulmonary nodules are unchanged. No evidence of edema. No acute osseous abnormalities. IMPRESSION: 1. No evidence of complication following right-sided thoracentesis with persistent moderate-sized partially loculated right-sided effusion. 2. Unchanged small left-sided pleural effusion. 3. Redemonstrated bilateral pulmonary nodules compatible with known metastatic disease better demonstrated on chest CT performed 11/14/2020. Electronically Signed   By: Sandi Mariscal M.D.   On: 01/20/2021 10:18   CT Chest W Contrast  Result Date: 01/25/2021 CLINICAL DATA:  Primary Cancer Type: Lung Imaging Indication: Assess response to therapy Interval therapy since last imaging? Yes Initial Cancer Diagnosis Date: 09/30/2019; Established by: Biopsy-proven Detailed Pathology: Stage IV non-small cell lung cancer, adenocarcinoma. Primary Tumor location: Right upper lobe. Surgeries: No. Chemotherapy: Yes; Ongoing? Yes; Most recent administration: 01/18/2021 Immunotherapy?  Yes; Type: Keytruda; Ongoing? No Radiation therapy? Yes; Date Range: 11/03/2019 - 11/23/2019; Target: Right upper lung  EXAM: CT CHEST ABDOMEN AND PELVIS WITH CONTRAST TECHNIQUE: Multidetector CT imaging of the chest was performed during intravenous contrast administration. CONTRAST:  70m OMNIPAQUE IOHEXOL 350 MG/ML SOLN, additional oral enteric contrast COMPARISON:  Most recent CT chest, abdomen and pelvis 11/14/2020. FINDINGS: CT CHEST FINDINGS Cardiovascular: Right chest port catheter. Normal heart size. No pericardial effusion. Mediastinum/Nodes: No enlarged mediastinal, hilar, or axillary lymph nodes. Thyroid gland, trachea, and esophagus demonstrate no significant findings. Lungs/Pleura: No significant change in post treatment appearance of a mass of the paramedian right upper lobe, measuring 6.3 x 3.3 cm (series 2, image 24). Numerous bilateral pulmonary nodules, some of which are cavitary. Some pulmonary nodules are minimally enlarged, for example a nodule of the medial left upper lobe measuring 1.1 x 1.0 cm previously 0.9 x 0.8 cm (series 4, image 62), and a nodule of the superior segment left lower lobe measuring 1.3 x 1.0 cm previously 1.0 x 0.9 cm (series 4, image 65). Many subcentimeter nodules are not demonstrably changed. Large right pleural effusion, slightly diminished in volume compared to prior examination. Moderate left pleural effusion, slightly increased in volume. Musculoskeletal: No chest wall mass or suspicious bone lesions identified. CT ABDOMEN PELVIS FINDINGS Hepatobiliary: No solid liver  abnormality is seen. No gallstones, gallbladder wall thickening, or biliary dilatation. Pancreas: Unremarkable. No pancreatic ductal dilatation or surrounding inflammatory changes. Spleen: Normal in size without significant abnormality. Adrenals/Urinary Tract: Adrenal glands are unremarkable. Kidneys are normal, without renal calculi, solid lesion, or hydronephrosis. Large calculus in the dependent bladder (series 2, image 117). Stomach/Bowel: Stomach is within normal limits. Appendix appears normal. No evidence of bowel  wall thickening, distention, or inflammatory changes. Sigmoid diverticulosis. Large burden of stool throughout colon and rectum. Vascular/Lymphatic: No significant vascular findings are present. No enlarged abdominal or pelvic lymph nodes. Reproductive: No mass or other abnormality. Other: No abdominal wall hernia or abnormality. No abdominopelvic ascites. Musculoskeletal: No acute or significant osseous findings. IMPRESSION: 1. No significant change in post treatment appearance of a mass of the paramedian right upper lobe. 2. Numerous bilateral pulmonary nodules, some of which are cavitary. Some pulmonary nodules are minimally enlarged; many subcentimeter nodules are not demonstrably changed. Findings are consistent with stable to slightly worsened pulmonary metastatic disease. 3. Large right pleural effusion, slightly diminished in volume compared to prior examination. Moderate left pleural effusion, slightly increased in volume compared to prior examination. No direct evidence of pleural metastatic disease. 4. No evidence of distant metastatic disease in the abdomen or pelvis. 5. Large calculus in the dependent bladder. Electronically Signed   By: Delanna Ahmadi M.D.   On: 01/25/2021 11:33   CT Abdomen W Contrast  Result Date: 01/25/2021 CLINICAL DATA:  Primary Cancer Type: Lung Imaging Indication: Assess response to therapy Interval therapy since last imaging? Yes Initial Cancer Diagnosis Date: 09/30/2019; Established by: Biopsy-proven Detailed Pathology: Stage IV non-small cell lung cancer, adenocarcinoma. Primary Tumor location: Right upper lobe. Surgeries: No. Chemotherapy: Yes; Ongoing? Yes; Most recent administration: 01/18/2021 Immunotherapy?  Yes; Type: Keytruda; Ongoing? No Radiation therapy? Yes; Date Range: 11/03/2019 - 11/23/2019; Target: Right upper lung EXAM: CT CHEST ABDOMEN AND PELVIS WITH CONTRAST TECHNIQUE: Multidetector CT imaging of the chest was performed during intravenous contrast  administration. CONTRAST:  39m OMNIPAQUE IOHEXOL 350 MG/ML SOLN, additional oral enteric contrast COMPARISON:  Most recent CT chest, abdomen and pelvis 11/14/2020. FINDINGS: CT CHEST FINDINGS Cardiovascular: Right chest port catheter. Normal heart size. No pericardial effusion. Mediastinum/Nodes: No enlarged mediastinal, hilar, or axillary lymph nodes. Thyroid gland, trachea, and esophagus demonstrate no significant findings. Lungs/Pleura: No significant change in post treatment appearance of a mass of the paramedian right upper lobe, measuring 6.3 x 3.3 cm (series 2, image 24). Numerous bilateral pulmonary nodules, some of which are cavitary. Some pulmonary nodules are minimally enlarged, for example a nodule of the medial left upper lobe measuring 1.1 x 1.0 cm previously 0.9 x 0.8 cm (series 4, image 62), and a nodule of the superior segment left lower lobe measuring 1.3 x 1.0 cm previously 1.0 x 0.9 cm (series 4, image 65). Many subcentimeter nodules are not demonstrably changed. Large right pleural effusion, slightly diminished in volume compared to prior examination. Moderate left pleural effusion, slightly increased in volume. Musculoskeletal: No chest wall mass or suspicious bone lesions identified. CT ABDOMEN PELVIS FINDINGS Hepatobiliary: No solid liver abnormality is seen. No gallstones, gallbladder wall thickening, or biliary dilatation. Pancreas: Unremarkable. No pancreatic ductal dilatation or surrounding inflammatory changes. Spleen: Normal in size without significant abnormality. Adrenals/Urinary Tract: Adrenal glands are unremarkable. Kidneys are normal, without renal calculi, solid lesion, or hydronephrosis. Large calculus in the dependent bladder (series 2, image 117). Stomach/Bowel: Stomach is within normal limits. Appendix appears normal. No evidence of bowel wall thickening, distention,  or inflammatory changes. Sigmoid diverticulosis. Large burden of stool throughout colon and rectum.  Vascular/Lymphatic: No significant vascular findings are present. No enlarged abdominal or pelvic lymph nodes. Reproductive: No mass or other abnormality. Other: No abdominal wall hernia or abnormality. No abdominopelvic ascites. Musculoskeletal: No acute or significant osseous findings. IMPRESSION: 1. No significant change in post treatment appearance of a mass of the paramedian right upper lobe. 2. Numerous bilateral pulmonary nodules, some of which are cavitary. Some pulmonary nodules are minimally enlarged; many subcentimeter nodules are not demonstrably changed. Findings are consistent with stable to slightly worsened pulmonary metastatic disease. 3. Large right pleural effusion, slightly diminished in volume compared to prior examination. Moderate left pleural effusion, slightly increased in volume compared to prior examination. No direct evidence of pleural metastatic disease. 4. No evidence of distant metastatic disease in the abdomen or pelvis. 5. Large calculus in the dependent bladder. Electronically Signed   By: Delanna Ahmadi M.D.   On: 01/25/2021 11:33   CUP PACEART REMOTE DEVICE CHECK  Result Date: 01/23/2021 ILR summary report received. Battery status OK. Normal device function. No new symptom, tachy, brady, or pause episodes. Monthly summary reports and ROV/PRN AF burden 100%, CVR Pt. prescribed Diltiazem, Eliquis LR  IR THORACENTESIS ASP PLEURAL SPACE W/IMG GUIDE  Result Date: 02/03/2021 INDICATION: Patient history of lung cancer with recurrent right-sided pleural effusion. Presents for therapeutic thoracentesis. EXAM: ULTRASOUND GUIDED THERAPEUTIC THORACENTESIS MEDICATIONS: Lidocaine 1% 10 mL COMPLICATIONS: None immediate. PROCEDURE: An ultrasound guided thoracentesis was thoroughly discussed with the patient and questions answered. The benefits, risks, alternatives and complications were also discussed. The patient understands and wishes to proceed with the procedure. Written consent was  obtained. Ultrasound was performed to localize and mark an adequate pocket of fluid in the right chest. The area was then prepped and draped in the normal sterile fashion. 1% Lidocaine was used for local anesthesia. Under ultrasound guidance a 19 gauge, 7-cm, Yueh catheter was introduced. Thoracentesis was performed. The catheter was removed and a dressing applied. FINDINGS: A total of approximately 1.45 L of straw-colored fluid was removed. IMPRESSION: Successful ultrasound guided right-sided therapeutic thoracentesis. Read by: Rushie Nyhan, NP Electronically Signed   By: Corrie Mckusick D.O.   On: 02/03/2021 16:09   US Thoracentesis Asp Pleural space w/IMG guide  Result Date: 01/20/2021 INDICATION: Recurrent pleural effusion, lung cancer EXAM: ULTRASOUND GUIDED RIGHT THORACENTESIS MEDICATIONS: None. COMPLICATIONS: None immediate. PROCEDURE: An ultrasound guided thoracentesis was thoroughly discussed with the patient and questions answered. The benefits, risks, alternatives and complications were also discussed. The patient understands and wishes to proceed with the procedure. Written consent was obtained. Ultrasound was performed to localize and mark an adequate pocket of fluid in the right chest. The area was then prepped and draped in the normal sterile fashion. 1% Lidocaine was used for local anesthesia. Under ultrasound guidance a 6 Fr Safe-T-Centesis catheter was introduced. Thoracentesis was performed. The catheter was removed and a dressing applied. FINDINGS: A total of approximately 1.7 L of amber fluid was removed. IMPRESSION: Successful ultrasound guided right thoracentesis yielding 1.7 L of pleural fluid. Read and performed by: Alexandria Lodge, PA-C Electronically Signed   By: Sandi Mariscal M.D.   On: 01/20/2021 10:34     ASSESSMENT AND PLAN: This is a very pleasant 70 years old white male recently diagnosed with a stage IV (T3, N2, M1 a) non-small cell lung cancer, adenocarcinoma presented  with large right upper lobe lung mass with extension to the right suprahilar region  as well as right hilar and mediastinal lymphadenopathy with obstruction of the right upper lobe and bilateral pulmonary nodules diagnosed in July 2021. The molecular study shows no actionable mutations and PD-L1 expression is negative. He had a short course of palliative radiotherapy to the obstructive lung mass under the care of Dr. Lisbeth Renshaw. The patient underwent systemic chemotherapy with carboplatin for AUC of 5, Alimta 500 mg/M2 and Keytruda 200 mg IV every 3 weeks status post 9 cycles.  Starting from cycle #5 he will be on maintenance treatment with Alimta and Keytruda every 3 weeks.  He has been tolerating this treatment well with no concerning complaints except for fatigue and cough.  This treatment was discontinued secondary to disease progression. The patient underwent palliative second line systemic chemotherapy with docetaxel 75 mg/M2 and Cyramza 10 mg/KG every 3 weeks with Neulasta support.  Started May 18, 2020.  Status post 2 cycles. Unfortunately his scan showed evidence for disease progression and multiple bilateral pulmonary nodules but stable disease in the dominant mass and right pleural effusion. He is currently undergoing palliative systemic chemotherapy with single agent gemcitabine 1000 MG/M2 IV on days 1 and 8 every 3 weeks.  Status post 10 cycles.  The patient has been tolerating this treatment well except for fatigue and the chemotherapy-induced neutropenia after cycle #6. Starting from cycle #7 his dose of gemcitabine was reduced to 800 Mg/M2 on days 1 and 8 every 3 weeks. The patient tolerated the last cycle of his treatment well except for fatigue and also chemotherapy-induced neutropenia and he missed day 8 of cycle #10. CBC today showed improvement of his blood count and the patient will proceed with day 1 of cycle #11 today as planned. For the recurrent right pleural effusion, he underwent  ultrasound-guided right thoracentesis last week with improvement of his symptoms.  He is meeting with pulmonary medicine for consideration of Pleurx catheter placement. For the persistent cough, I will give him refill of Hycodan. The patient was advised to call immediately if he has any other concerning symptoms in the interval.  The patient voices understanding of current disease status and treatment options and is in agreement with the current care plan.  All questions were answered. The patient knows to call the clinic with any problems, questions or concerns. We can certainly see the patient much sooner if necessary.  Disclaimer: This note was dictated with voice recognition software. Similar sounding words can inadvertently be transcribed and may not be corrected upon review.

## 2021-02-08 NOTE — Patient Instructions (Signed)
[facility]  Discharge Instructions: Thank you for choosing [facility] to provide your oncology and hematology care.   If you have a lab appointment with the [facility], please go directly to the [facility] and check in at the registration area.   Wear comfortable clothing and clothing appropriate for easy access to any Portacath or PICC line.   We strive to give you quality time with your provider. You may need to reschedule your appointment if you arrive late (15 or more minutes).  Arriving late affects you and other patients whose appointments are after yours.  Also, if you miss three or more appointments without notifying the office, you may be dismissed from the clinic at the provider's discretion.      For prescription refill requests, have your pharmacy contact our office and allow 72 hours for refills to be completed.    Today you received the following chemotherapy and/or immunotherapy agents gemzar      To help prevent nausea and vomiting after your treatment, we encourage you to take your nausea medication as directed.  BELOW ARE SYMPTOMS THAT SHOULD BE REPORTED IMMEDIATELY: *FEVER GREATER THAN 100.4 F (38 C) OR HIGHER *CHILLS OR SWEATING *NAUSEA AND VOMITING THAT IS NOT CONTROLLED WITH YOUR NAUSEA MEDICATION *UNUSUAL SHORTNESS OF BREATH *UNUSUAL BRUISING OR BLEEDING *URINARY PROBLEMS (pain or burning when urinating, or frequent urination) *BOWEL PROBLEMS (unusual diarrhea, constipation, pain near the anus) TENDERNESS IN MOUTH AND THROAT WITH OR WITHOUT PRESENCE OF ULCERS (sore throat, sores in mouth, or a toothache) UNUSUAL RASH, SWELLING OR PAIN  UNUSUAL VAGINAL DISCHARGE OR ITCHING   Items with * indicate a potential emergency and should be followed up as soon as possible or go to the Emergency Department if any problems should occur.  Please show the CHEMOTHERAPY ALERT CARD or IMMUNOTHERAPY ALERT CARD at check-in to the  Emergency Department and triage nurse.  Should you have questions after your visit or need to cancel or reschedule your appointment, please contact [facility]  Dept: [phone]  and follow the prompts.  Office hours are 8:00 a.m. to 4:30 p.m. Monday - Friday. Please note that voicemails left after 4:00 p.m. may not be returned until the following business day.  We are closed weekends and major holidays. You have access to a nurse at all times for urgent questions. Please call the main number to the clinic Dept: [phone] and follow the prompts.   For any non-urgent questions, you may also contact your provider using MyChart. We now offer e-Visits for anyone 62 and older to request care online for non-urgent symptoms. For details visit [email or web address].   Also download the MyChart app! Go to the app store, search "MyChart", open the app, select [facility], and log in with your MyChart username and password.  Due to Covid, a mask is required upon entering the hospital/clinic. If you do not have a mask, one will be given to you upon arrival. For doctor visits, patients may have 1 support person aged 3 or older with them. For treatment visits, patients cannot have anyone with them due to current Covid guidelines and our immunocompromised population.

## 2021-02-09 ENCOUNTER — Encounter: Payer: Self-pay | Admitting: Pulmonary Disease

## 2021-02-09 ENCOUNTER — Ambulatory Visit (INDEPENDENT_AMBULATORY_CARE_PROVIDER_SITE_OTHER): Payer: Medicare HMO | Admitting: Pulmonary Disease

## 2021-02-09 VITALS — BP 124/76 | HR 82 | Ht 74.0 in | Wt 157.5 lb

## 2021-02-09 DIAGNOSIS — R0609 Other forms of dyspnea: Secondary | ICD-10-CM | POA: Diagnosis not present

## 2021-02-09 NOTE — H&P (View-Only) (Signed)
@Patient  ID: Micheal Oliver, male    DOB: 05-12-1950, 70 y.o.   MRN: [id number]  Chief Complaint  Patient presents with   Consult    Referred for right pleural effusion. States every 5-10 days, he produces 2L of fluid. Wants to discuss options for catheter.     Referring provider: Heilingoetter, Cassandr*  HPI:   70 y.o. man whom we are seeing in consultation for evaluation of bilateral pleural effusions.  Right pleural effusion presumed to be malignant with negative cytology 08/2020, no other results available for review.  Most recent oncology note reviewed.  Most recent PCP note reviewed.  Patient here to discuss Pleurx catheter.  He has had multiple thoracentesis over the last several months.  Initially got 2.45 L off on review.  Subsequently having decreasing amounts of fluid removed each time, most recently about 1.5 L.  Having to stop because of chest pain.  High suspicion for lung entrapment or trapped lung.  Discussed with patient.  His need for thoracentesis is prompted by increasing dyspnea.  After thoracentesis, his dyspnea improved.  He had cytology sent 08/2020 without evidence of malignant cells.  No further cytology sent.  Discussed at length that most likely the pleural effusion is malignant in nature.  Notably, he has a left-sided effusion.  Was not present on CT scan 07/2020 present on CT scan suspect 2022.  Present on subsequent CT scans and chest x-rays on review of serial images.  Most recent CT chest personally viewed interpreted as large mass on the right, satellite pulmonary metastases seen in the left lung, bilateral right greater than left effusions.  Effusions are loculated.  Etiology of the left pleural effusion was less clear.  Possibly malignant given metastases.  Possible third days seen in the setting of failure to thrive, hypoalbuminemia.  Notably last echocardiogram 09/2019 reviewed with mildly dilated left atrial, diastolic dysfunction, mild mitral valve  regurgitation, trivial aortic regurgitation.  PMH: Lung cancer, atrial fibrillation on anticoagulation, diabetes, hyperlipidemia Surgical history: Reviewed with patient, denies any surgeries And history: Father with emphysema, CAD, mother with cancer Social history: Never smoker, lives in [place], moved here around 2012  Questionaires / Pulmonary Flowsheets:   ACT:  No flowsheet data found.  MMRC: No flowsheet data found.  [facility]:  No flowsheet data found.  Tests:   FENO:  No results found for: NITRICOXIDE  PFT: No flowsheet data found.  WALK:  No flowsheet data found.  Imaging: Personally reviewed and as per EMR discussion this note DG Chest 1 View  Result Date: 02/03/2021 CLINICAL DATA:  s/p right thoracentesis EXAM: CHEST  1 VIEW COMPARISON:  January 20, 2021 FINDINGS: The cardiomediastinal silhouette is unchanged in contour.RIGHT chest port with tip terminating over the RIGHT atrium. Cardiac loop recorder. Moderate RIGHT pleural effusion. Small LEFT pleural effusion. No significant pneumothorax. Revisualization of a RIGHT paramediastinal mass and multiple bilateral pulmonary nodules consistent with known malignancy. Bibasilar homogeneous opacification most consistent with atelectasis. Visualized abdomen is unremarkable. IMPRESSION: Moderate RIGHT and small LEFT pleural effusion. Electronically Signed   By: Valentino Saxon M.D.   On: 02/03/2021 13:45   DG Chest 1 View  Result Date: 01/20/2021 CLINICAL DATA:  Post right-sided thoracentesis EXAM: CHEST  1 VIEW COMPARISON:  12/30/2020; 12/12/2020; chest CT-11/14/2020 FINDINGS: Grossly unchanged cardiac silhouette and mediastinal contours with partial obscuration of right heart border secondary to moderate sized partially loculated right-sided effusion and associated right basilar heterogeneous/consolidative opacities, similar to the 12/30/2020 examination. Stable position of support apparatus. Redemonstrated  masslike  thickening involving the right hilum with scattered bilateral pulmonary nodules compatible with known malignancy and metastatic disease. Unchanged small left-sided effusion and associated left basilar heterogeneous opacities. Scattered bilateral pulmonary nodules are unchanged. No evidence of edema. No acute osseous abnormalities. IMPRESSION: 1. No evidence of complication following right-sided thoracentesis with persistent moderate-sized partially loculated right-sided effusion. 2. Unchanged small left-sided pleural effusion. 3. Redemonstrated bilateral pulmonary nodules compatible with known metastatic disease better demonstrated on chest CT performed 11/14/2020. Electronically Signed   By: Sandi Mariscal M.D.   On: 01/20/2021 10:18   CT Chest W Contrast  Result Date: 01/25/2021 CLINICAL DATA:  Primary Cancer Type: Lung Imaging Indication: Assess response to therapy Interval therapy since last imaging? Yes Initial Cancer Diagnosis Date: 09/30/2019; Established by: Biopsy-proven Detailed Pathology: Stage IV non-small cell lung cancer, adenocarcinoma. Primary Tumor location: Right upper lobe. Surgeries: No. Chemotherapy: Yes; Ongoing? Yes; Most recent administration: 01/18/2021 Immunotherapy?  Yes; Type: Keytruda; Ongoing? No Radiation therapy? Yes; Date Range: 11/03/2019 - 11/23/2019; Target: Right upper lung EXAM: CT CHEST ABDOMEN AND PELVIS WITH CONTRAST TECHNIQUE: Multidetector CT imaging of the chest was performed during intravenous contrast administration. CONTRAST:  41mL OMNIPAQUE IOHEXOL 350 MG/ML SOLN, additional oral enteric contrast COMPARISON:  Most recent CT chest, abdomen and pelvis 11/14/2020. FINDINGS: CT CHEST FINDINGS Cardiovascular: Right chest port catheter. Normal heart size. No pericardial effusion. Mediastinum/Nodes: No enlarged mediastinal, hilar, or axillary lymph nodes. Thyroid gland, trachea, and esophagus demonstrate no significant findings. Lungs/Pleura: No significant change in post  treatment appearance of a mass of the paramedian right upper lobe, measuring 6.3 x 3.3 cm (series 2, image 24). Numerous bilateral pulmonary nodules, some of which are cavitary. Some pulmonary nodules are minimally enlarged, for example a nodule of the medial left upper lobe measuring 1.1 x 1.0 cm previously 0.9 x 0.8 cm (series 4, image 62), and a nodule of the superior segment left lower lobe measuring 1.3 x 1.0 cm previously 1.0 x 0.9 cm (series 4, image 65). Many subcentimeter nodules are not demonstrably changed. Large right pleural effusion, slightly diminished in volume compared to prior examination. Moderate left pleural effusion, slightly increased in volume. Musculoskeletal: No chest wall mass or suspicious bone lesions identified. CT ABDOMEN PELVIS FINDINGS Hepatobiliary: No solid liver abnormality is seen. No gallstones, gallbladder wall thickening, or biliary dilatation. Pancreas: Unremarkable. No pancreatic ductal dilatation or surrounding inflammatory changes. Spleen: Normal in size without significant abnormality. Adrenals/Urinary Tract: Adrenal glands are unremarkable. Kidneys are normal, without renal calculi, solid lesion, or hydronephrosis. Large calculus in the dependent bladder (series 2, image 117). Stomach/Bowel: Stomach is within normal limits. Appendix appears normal. No evidence of bowel wall thickening, distention, or inflammatory changes. Sigmoid diverticulosis. Large burden of stool throughout colon and rectum. Vascular/Lymphatic: No significant vascular findings are present. No enlarged abdominal or pelvic lymph nodes. Reproductive: No mass or other abnormality. Other: No abdominal wall hernia or abnormality. No abdominopelvic ascites. Musculoskeletal: No acute or significant osseous findings. IMPRESSION: 1. No significant change in post treatment appearance of a mass of the paramedian right upper lobe. 2. Numerous bilateral pulmonary nodules, some of which are cavitary. Some pulmonary  nodules are minimally enlarged; many subcentimeter nodules are not demonstrably changed. Findings are consistent with stable to slightly worsened pulmonary metastatic disease. 3. Large right pleural effusion, slightly diminished in volume compared to prior examination. Moderate left pleural effusion, slightly increased in volume compared to prior examination. No direct evidence of pleural metastatic disease. 4. No evidence of distant metastatic disease  in the abdomen or pelvis. 5. Large calculus in the dependent bladder. Electronically Signed   By: Delanna Ahmadi M.D.   On: 01/25/2021 11:33   CT Abdomen W Contrast  Result Date: 01/25/2021 CLINICAL DATA:  Primary Cancer Type: Lung Imaging Indication: Assess response to therapy Interval therapy since last imaging? Yes Initial Cancer Diagnosis Date: 09/30/2019; Established by: Biopsy-proven Detailed Pathology: Stage IV non-small cell lung cancer, adenocarcinoma. Primary Tumor location: Right upper lobe. Surgeries: No. Chemotherapy: Yes; Ongoing? Yes; Most recent administration: 01/18/2021 Immunotherapy?  Yes; Type: Keytruda; Ongoing? No Radiation therapy? Yes; Date Range: 11/03/2019 - 11/23/2019; Target: Right upper lung EXAM: CT CHEST ABDOMEN AND PELVIS WITH CONTRAST TECHNIQUE: Multidetector CT imaging of the chest was performed during intravenous contrast administration. CONTRAST:  82mL OMNIPAQUE IOHEXOL 350 MG/ML SOLN, additional oral enteric contrast COMPARISON:  Most recent CT chest, abdomen and pelvis 11/14/2020. FINDINGS: CT CHEST FINDINGS Cardiovascular: Right chest port catheter. Normal heart size. No pericardial effusion. Mediastinum/Nodes: No enlarged mediastinal, hilar, or axillary lymph nodes. Thyroid gland, trachea, and esophagus demonstrate no significant findings. Lungs/Pleura: No significant change in post treatment appearance of a mass of the paramedian right upper lobe, measuring 6.3 x 3.3 cm (series 2, image 24). Numerous bilateral pulmonary  nodules, some of which are cavitary. Some pulmonary nodules are minimally enlarged, for example a nodule of the medial left upper lobe measuring 1.1 x 1.0 cm previously 0.9 x 0.8 cm (series 4, image 62), and a nodule of the superior segment left lower lobe measuring 1.3 x 1.0 cm previously 1.0 x 0.9 cm (series 4, image 65). Many subcentimeter nodules are not demonstrably changed. Large right pleural effusion, slightly diminished in volume compared to prior examination. Moderate left pleural effusion, slightly increased in volume. Musculoskeletal: No chest wall mass or suspicious bone lesions identified. CT ABDOMEN PELVIS FINDINGS Hepatobiliary: No solid liver abnormality is seen. No gallstones, gallbladder wall thickening, or biliary dilatation. Pancreas: Unremarkable. No pancreatic ductal dilatation or surrounding inflammatory changes. Spleen: Normal in size without significant abnormality. Adrenals/Urinary Tract: Adrenal glands are unremarkable. Kidneys are normal, without renal calculi, solid lesion, or hydronephrosis. Large calculus in the dependent bladder (series 2, image 117). Stomach/Bowel: Stomach is within normal limits. Appendix appears normal. No evidence of bowel wall thickening, distention, or inflammatory changes. Sigmoid diverticulosis. Large burden of stool throughout colon and rectum. Vascular/Lymphatic: No significant vascular findings are present. No enlarged abdominal or pelvic lymph nodes. Reproductive: No mass or other abnormality. Other: No abdominal wall hernia or abnormality. No abdominopelvic ascites. Musculoskeletal: No acute or significant osseous findings. IMPRESSION: 1. No significant change in post treatment appearance of a mass of the paramedian right upper lobe. 2. Numerous bilateral pulmonary nodules, some of which are cavitary. Some pulmonary nodules are minimally enlarged; many subcentimeter nodules are not demonstrably changed. Findings are consistent with stable to slightly  worsened pulmonary metastatic disease. 3. Large right pleural effusion, slightly diminished in volume compared to prior examination. Moderate left pleural effusion, slightly increased in volume compared to prior examination. No direct evidence of pleural metastatic disease. 4. No evidence of distant metastatic disease in the abdomen or pelvis. 5. Large calculus in the dependent bladder. Electronically Signed   By: Delanna Ahmadi M.D.   On: 01/25/2021 11:33   CUP PACEART REMOTE DEVICE CHECK  Result Date: 01/23/2021 ILR summary report received. Battery status OK. Normal device function. No new symptom, tachy, brady, or pause episodes. Monthly summary reports and ROV/PRN AF burden 100%, CVR Pt. prescribed Diltiazem, Eliquis LR  IR THORACENTESIS ASP PLEURAL SPACE W/IMG GUIDE  Result Date: 02/03/2021 INDICATION: Patient history of lung cancer with recurrent right-sided pleural effusion. Presents for therapeutic thoracentesis. EXAM: ULTRASOUND GUIDED THERAPEUTIC THORACENTESIS MEDICATIONS: Lidocaine 1% 10 mL COMPLICATIONS: None immediate. PROCEDURE: An ultrasound guided thoracentesis was thoroughly discussed with the patient and questions answered. The benefits, risks, alternatives and complications were also discussed. The patient understands and wishes to proceed with the procedure. Written consent was obtained. Ultrasound was performed to localize and mark an adequate pocket of fluid in the right chest. The area was then prepped and draped in the normal sterile fashion. 1% Lidocaine was used for local anesthesia. Under ultrasound guidance a 19 gauge, 7-cm, Yueh catheter was introduced. Thoracentesis was performed. The catheter was removed and a dressing applied. FINDINGS: A total of approximately 1.45 L of straw-colored fluid was removed. IMPRESSION: Successful ultrasound guided right-sided therapeutic thoracentesis. Read by: Rushie Nyhan, NP Electronically Signed   By: Corrie Mckusick D.O.   On: 02/03/2021  16:09   US Thoracentesis Asp Pleural space w/IMG guide  Result Date: 01/20/2021 INDICATION: Recurrent pleural effusion, lung cancer EXAM: ULTRASOUND GUIDED RIGHT THORACENTESIS MEDICATIONS: None. COMPLICATIONS: None immediate. PROCEDURE: An ultrasound guided thoracentesis was thoroughly discussed with the patient and questions answered. The benefits, risks, alternatives and complications were also discussed. The patient understands and wishes to proceed with the procedure. Written consent was obtained. Ultrasound was performed to localize and mark an adequate pocket of fluid in the right chest. The area was then prepped and draped in the normal sterile fashion. 1% Lidocaine was used for local anesthesia. Under ultrasound guidance a 6 Fr Safe-T-Centesis catheter was introduced. Thoracentesis was performed. The catheter was removed and a dressing applied. FINDINGS: A total of approximately 1.7 L of amber fluid was removed. IMPRESSION: Successful ultrasound guided right thoracentesis yielding 1.7 L of pleural fluid. Read and performed by: Alexandria Lodge, PA-C Electronically Signed   By: Sandi Mariscal M.D.   On: 01/20/2021 10:34    Lab Results: Personally reviewed CBC    Component Value Date/Time   WBC 10.5 02/08/2021 1403   WBC 10.6 (H) 09/24/2019 1203   RBC 2.92 (L) 02/08/2021 1403   HGB 10.7 (L) 02/08/2021 1403   HCT 32.0 (L) 02/08/2021 1403   PLT 385 02/08/2021 1403   MCV 109.6 (H) 02/08/2021 1403   MCH 36.6 (H) 02/08/2021 1403   MCHC 33.4 02/08/2021 1403   RDW 19.3 (H) 02/08/2021 1403   LYMPHSABS 0.6 (L) 02/08/2021 1403   MONOABS 1.1 (H) 02/08/2021 1403   EOSABS 0.1 02/08/2021 1403   BASOSABS 0.1 02/08/2021 1403    BMET    Component Value Date/Time   NA 138 02/08/2021 1403   K 4.6 02/08/2021 1403   CL 104 02/08/2021 1403   CO2 25 02/08/2021 1403   GLUCOSE 102 (H) 02/08/2021 1403   BUN 29 (H) 02/08/2021 1403   CREATININE 0.95 02/08/2021 1403   CREATININE 1.13 10/16/2013 1118    CALCIUM 8.9 02/08/2021 1403   GFRNONAA >60 02/08/2021 1403   GFRAA >60 12/30/2019 0844    BNP No results found for: BNP  ProBNP No results found for: PROBNP  Specialty Problems       Pulmonary Problems   Mass of right lung   Malignant neoplasm of right upper lobe of lung (HCC)   Cough   Pleural effusion    Allergies  Allergen Reactions   Bee Venom     Lip swelling    Immunization History  Administered Date(s) Administered   Fluad Quad(high Dose 65+) 01/06/2020   Influenza,inj,Quad PF,6+ Mos 12/22/2014, 01/10/2016   Influenza-Unspecified 12/02/2018   PFIZER Comirnaty(Gray Top)Covid-19 Tri-Sucrose Vaccine 08/10/2020   PFIZER(Purple Top)SARS-COV-2 Vaccination 04/29/2019, 05/20/2019, 12/09/2019   Pneumococcal Conjugate-13 07/12/2016   Pneumococcal Polysaccharide-23 07/15/2017   Tdap 10/16/2013, 12/22/2014   Zoster, Live 01/21/2015    Past Medical History:  Diagnosis Date   Chicken pox    DM2 (diabetes mellitus, type 2) ([facility])    History of kidney stones    passed several    Hypertension    Kidney stones    5-6 times   lung ca 10/2019   Measles    Mumps    Stroke ([facility])    x 2, last one was 09/18/2019- has some expressive aphasia    Tobacco History: Social History   Tobacco Use  Smoking Status Never  Smokeless Tobacco Never   Counseling given: Not Answered   Continue to not smoke  Outpatient Encounter Medications as of 02/09/2021  Medication Sig   albuterol (VENTOLIN HFA) 108 (90 Base) MCG/ACT inhaler Inhale 2 puffs into the lungs every 6 (six) hours as needed for wheezing or shortness of breath.   apixaban (ELIQUIS) 5 MG TABS tablet Take 1 tablet (5 mg total) by mouth 2 (two) times daily.   atorvastatin (LIPITOR) 40 MG tablet TAKE ONE TABLET BY MOUTH ONE TIME DAILY 6PM   diltiazem (CARDIZEM CD) 180 MG 24 hr capsule Take 1 capsule (180 mg total) by mouth daily.   diphenhydrAMINE HCl, Sleep, (ZZZQUIL) 25 MG CAPS Take 25 mg by mouth at bedtime.    HYDROcodone bit-homatropine (HYCODAN) 5-1.5 MG/5ML syrup Take 5 mLs by mouth every 6 (six) hours as needed for cough.   lidocaine-prilocaine (EMLA) cream Apply to the Port-A-Cath site 30-60-minute before treatment   metFORMIN (GLUCOPHAGE) 500 MG tablet TAKE TWO TABLETS BY MOUTH TWICE A DAY WITH MEAL(S)   methylPREDNISolone (MEDROL DOSEPAK) 4 MG TBPK tablet Use as instructed.   Omega-3 Fatty Acids (FISH OIL) 1200 MG CAPS Take 1,200 mg by mouth daily.   prochlorperazine (COMPAZINE) 10 MG tablet Take 1 tablet (10 mg total) by mouth every 6 (six) hours as needed.   No facility-administered encounter medications on file as of 02/09/2021.     Review of Systems  Review of Systems  No chest with exertion.  No orthopnea or PND.  Comprehensive review of systems otherwise negative. Physical Exam  BP 124/76   Pulse 82   Ht 6\' 2"  (1.88 m)   Wt 157 lb 8 oz (71.4 kg)   SpO2 98% Comment: on RA  BMI 20.22 kg/m   Wt Readings from Last 5 Encounters:  02/09/21 157 lb 8 oz (71.4 kg)  02/08/21 157 lb 9.6 oz (71.5 kg)  01/25/21 161 lb (73 kg)  01/18/21 164 lb 1.6 oz (74.4 kg)  01/04/21 164 lb (74.4 kg)    BMI Readings from Last 5 Encounters:  02/09/21 20.22 kg/m  02/08/21 20.23 kg/m  01/25/21 20.67 kg/m  01/18/21 21.07 kg/m  01/04/21 21.06 kg/m     Physical Exam General: Thin, in wheelchair Eyes: EOMI, icterus Neck: Supple, JVP appreciated Pulmonary: Distant, absent in bilateral bases, normal work of breathing Cardiovascular: Regular rate and rhythm, no murmurs Abdomen: Nondistended, bowel sounds present MSK: No synovitis, no joint effusion Neuro: In wheelchair, no focal weakness Psych: Normal mood, full affect   Assessment & Plan:   Bilateral, right greater than left effusions: Right predates the left.  Presumably malignant  on the right.  Cytology only sent 09/19/20 without malignant cells present.  Clinically likely malignant.  Left pleural effusion was not present 07/2020 now  present 08/2020 and subsequent CT scans and chest x-rays.  Does seem to have metastatic disease on the left although he states he has no cancer on the left.  Unclear what has  been communicated to him.  Possible this is a malignant effusion on the left as well.  However given the bilateral nature, lower extremity swelling, will evaluate for cardiomyopathy related to chemotherapy, atrial fibrillation, or otherwise with echocardiogram.  Patient is in agreement for Pleurx catheter placement on the right.  Getting thoracentesis every 2 to 3 weeks.  Therapeutic benefit after drainage.  Consider thoracentesis on the left in the future pending results of echocardiogram. --Message colleagues and staff regarding timing of procedure as well as exploring benefits for home health etc for care at home post procedure   Return if symptoms worsen or fail to improve.   Lanier Clam, MD 02/09/2021  I spent 85 minutes in the care of this patient including review of records, face-to-face care, coordination of care.

## 2021-02-09 NOTE — Patient Instructions (Addendum)
Nice to meet you  I will begin the process of arranging a Pleurx catheter placement  We will work in parallel with the logistics to answer some your questions as well as with my colleagues to do the procedure to try to get this scheduled for you in the coming days.  If you develop worsening shortness of breath and feel like you need a thoracentesis prior to this procedure being scheduled, please call this office and Dr. Lew Dawes office so we can help guide you with next steps.  I ordered an echocardiogram (heart ultrasound) to further evaluate why fluid is on both sides of the lungs.  We will get you back in clinic after the procedure to make sure everything is going okay

## 2021-02-09 NOTE — Progress Notes (Signed)
@Patient  ID: Micheal Oliver, male    DOB: 19-May-1950, 70 y.o.   MRN: [id number]  Chief Complaint  Patient presents with   Consult    Referred for right pleural effusion. States every 5-10 days, he produces 2L of fluid. Wants to discuss options for catheter.     Referring provider: Heilingoetter, Cassandr*  HPI:   70 y.o. man whom we are seeing in consultation for evaluation of bilateral pleural effusions.  Right pleural effusion presumed to be malignant with negative cytology 08/2020, no other results available for review.  Most recent oncology note reviewed.  Most recent PCP note reviewed.  Patient here to discuss Pleurx catheter.  He has had multiple thoracentesis over the last several months.  Initially got 2.45 L off on review.  Subsequently having decreasing amounts of fluid removed each time, most recently about 1.5 L.  Having to stop because of chest pain.  High suspicion for lung entrapment or trapped lung.  Discussed with patient.  His need for thoracentesis is prompted by increasing dyspnea.  After thoracentesis, his dyspnea improved.  He had cytology sent 08/2020 without evidence of malignant cells.  No further cytology sent.  Discussed at length that most likely the pleural effusion is malignant in nature.  Notably, he has a left-sided effusion.  Was not present on CT scan 07/2020 present on CT scan suspect 2022.  Present on subsequent CT scans and chest x-rays on review of serial images.  Most recent CT chest personally viewed interpreted as large mass on the right, satellite pulmonary metastases seen in the left lung, bilateral right greater than left effusions.  Effusions are loculated.  Etiology of the left pleural effusion was less clear.  Possibly malignant given metastases.  Possible third days seen in the setting of failure to thrive, hypoalbuminemia.  Notably last echocardiogram 09/2019 reviewed with mildly dilated left atrial, diastolic dysfunction, mild mitral valve  regurgitation, trivial aortic regurgitation.  PMH: Lung cancer, atrial fibrillation on anticoagulation, diabetes, hyperlipidemia Surgical history: Reviewed with patient, denies any surgeries And history: Father with emphysema, CAD, mother with cancer Social history: Never smoker, lives in [place], moved here around 2012  Questionaires / Pulmonary Flowsheets:   ACT:  No flowsheet data found.  MMRC: No flowsheet data found.  Epworth:  No flowsheet data found.  Tests:   FENO:  No results found for: NITRICOXIDE  PFT: No flowsheet data found.  WALK:  No flowsheet data found.  Imaging: Personally reviewed and as per EMR discussion this note DG Chest 1 View  Result Date: 02/03/2021 CLINICAL DATA:  s/p right thoracentesis EXAM: CHEST  1 VIEW COMPARISON:  January 20, 2021 FINDINGS: The cardiomediastinal silhouette is unchanged in contour.RIGHT chest port with tip terminating over the RIGHT atrium. Cardiac loop recorder. Moderate RIGHT pleural effusion. Small LEFT pleural effusion. No significant pneumothorax. Revisualization of a RIGHT paramediastinal mass and multiple bilateral pulmonary nodules consistent with known malignancy. Bibasilar homogeneous opacification most consistent with atelectasis. Visualized abdomen is unremarkable. IMPRESSION: Moderate RIGHT and small LEFT pleural effusion. Electronically Signed   By: Valentino Saxon M.D.   On: 02/03/2021 13:45   DG Chest 1 View  Result Date: 01/20/2021 CLINICAL DATA:  Post right-sided thoracentesis EXAM: CHEST  1 VIEW COMPARISON:  12/30/2020; 12/12/2020; chest CT-11/14/2020 FINDINGS: Grossly unchanged cardiac silhouette and mediastinal contours with partial obscuration of right heart border secondary to moderate sized partially loculated right-sided effusion and associated right basilar heterogeneous/consolidative opacities, similar to the 12/30/2020 examination. Stable position of support apparatus. Redemonstrated  masslike  thickening involving the right hilum with scattered bilateral pulmonary nodules compatible with known malignancy and metastatic disease. Unchanged small left-sided effusion and associated left basilar heterogeneous opacities. Scattered bilateral pulmonary nodules are unchanged. No evidence of edema. No acute osseous abnormalities. IMPRESSION: 1. No evidence of complication following right-sided thoracentesis with persistent moderate-sized partially loculated right-sided effusion. 2. Unchanged small left-sided pleural effusion. 3. Redemonstrated bilateral pulmonary nodules compatible with known metastatic disease better demonstrated on chest CT performed 11/14/2020. Electronically Signed   By: Sandi Mariscal M.D.   On: 01/20/2021 10:18   CT Chest W Contrast  Result Date: 01/25/2021 CLINICAL DATA:  Primary Cancer Type: Lung Imaging Indication: Assess response to therapy Interval therapy since last imaging? Yes Initial Cancer Diagnosis Date: 09/30/2019; Established by: Biopsy-proven Detailed Pathology: Stage IV non-small cell lung cancer, adenocarcinoma. Primary Tumor location: Right upper lobe. Surgeries: No. Chemotherapy: Yes; Ongoing? Yes; Most recent administration: 01/18/2021 Immunotherapy?  Yes; Type: Keytruda; Ongoing? No Radiation therapy? Yes; Date Range: 11/03/2019 - 11/23/2019; Target: Right upper lung EXAM: CT CHEST ABDOMEN AND PELVIS WITH CONTRAST TECHNIQUE: Multidetector CT imaging of the chest was performed during intravenous contrast administration. CONTRAST:  48mL OMNIPAQUE IOHEXOL 350 MG/ML SOLN, additional oral enteric contrast COMPARISON:  Most recent CT chest, abdomen and pelvis 11/14/2020. FINDINGS: CT CHEST FINDINGS Cardiovascular: Right chest port catheter. Normal heart size. No pericardial effusion. Mediastinum/Nodes: No enlarged mediastinal, hilar, or axillary lymph nodes. Thyroid gland, trachea, and esophagus demonstrate no significant findings. Lungs/Pleura: No significant change in post  treatment appearance of a mass of the paramedian right upper lobe, measuring 6.3 x 3.3 cm (series 2, image 24). Numerous bilateral pulmonary nodules, some of which are cavitary. Some pulmonary nodules are minimally enlarged, for example a nodule of the medial left upper lobe measuring 1.1 x 1.0 cm previously 0.9 x 0.8 cm (series 4, image 62), and a nodule of the superior segment left lower lobe measuring 1.3 x 1.0 cm previously 1.0 x 0.9 cm (series 4, image 65). Many subcentimeter nodules are not demonstrably changed. Large right pleural effusion, slightly diminished in volume compared to prior examination. Moderate left pleural effusion, slightly increased in volume. Musculoskeletal: No chest wall mass or suspicious bone lesions identified. CT ABDOMEN PELVIS FINDINGS Hepatobiliary: No solid liver abnormality is seen. No gallstones, gallbladder wall thickening, or biliary dilatation. Pancreas: Unremarkable. No pancreatic ductal dilatation or surrounding inflammatory changes. Spleen: Normal in size without significant abnormality. Adrenals/Urinary Tract: Adrenal glands are unremarkable. Kidneys are normal, without renal calculi, solid lesion, or hydronephrosis. Large calculus in the dependent bladder (series 2, image 117). Stomach/Bowel: Stomach is within normal limits. Appendix appears normal. No evidence of bowel wall thickening, distention, or inflammatory changes. Sigmoid diverticulosis. Large burden of stool throughout colon and rectum. Vascular/Lymphatic: No significant vascular findings are present. No enlarged abdominal or pelvic lymph nodes. Reproductive: No mass or other abnormality. Other: No abdominal wall hernia or abnormality. No abdominopelvic ascites. Musculoskeletal: No acute or significant osseous findings. IMPRESSION: 1. No significant change in post treatment appearance of a mass of the paramedian right upper lobe. 2. Numerous bilateral pulmonary nodules, some of which are cavitary. Some pulmonary  nodules are minimally enlarged; many subcentimeter nodules are not demonstrably changed. Findings are consistent with stable to slightly worsened pulmonary metastatic disease. 3. Large right pleural effusion, slightly diminished in volume compared to prior examination. Moderate left pleural effusion, slightly increased in volume compared to prior examination. No direct evidence of pleural metastatic disease. 4. No evidence of distant metastatic disease  in the abdomen or pelvis. 5. Large calculus in the dependent bladder. Electronically Signed   By: Delanna Ahmadi M.D.   On: 01/25/2021 11:33   CT Abdomen W Contrast  Result Date: 01/25/2021 CLINICAL DATA:  Primary Cancer Type: Lung Imaging Indication: Assess response to therapy Interval therapy since last imaging? Yes Initial Cancer Diagnosis Date: 09/30/2019; Established by: Biopsy-proven Detailed Pathology: Stage IV non-small cell lung cancer, adenocarcinoma. Primary Tumor location: Right upper lobe. Surgeries: No. Chemotherapy: Yes; Ongoing? Yes; Most recent administration: 01/18/2021 Immunotherapy?  Yes; Type: Keytruda; Ongoing? No Radiation therapy? Yes; Date Range: 11/03/2019 - 11/23/2019; Target: Right upper lung EXAM: CT CHEST ABDOMEN AND PELVIS WITH CONTRAST TECHNIQUE: Multidetector CT imaging of the chest was performed during intravenous contrast administration. CONTRAST:  79mL OMNIPAQUE IOHEXOL 350 MG/ML SOLN, additional oral enteric contrast COMPARISON:  Most recent CT chest, abdomen and pelvis 11/14/2020. FINDINGS: CT CHEST FINDINGS Cardiovascular: Right chest port catheter. Normal heart size. No pericardial effusion. Mediastinum/Nodes: No enlarged mediastinal, hilar, or axillary lymph nodes. Thyroid gland, trachea, and esophagus demonstrate no significant findings. Lungs/Pleura: No significant change in post treatment appearance of a mass of the paramedian right upper lobe, measuring 6.3 x 3.3 cm (series 2, image 24). Numerous bilateral pulmonary  nodules, some of which are cavitary. Some pulmonary nodules are minimally enlarged, for example a nodule of the medial left upper lobe measuring 1.1 x 1.0 cm previously 0.9 x 0.8 cm (series 4, image 62), and a nodule of the superior segment left lower lobe measuring 1.3 x 1.0 cm previously 1.0 x 0.9 cm (series 4, image 65). Many subcentimeter nodules are not demonstrably changed. Large right pleural effusion, slightly diminished in volume compared to prior examination. Moderate left pleural effusion, slightly increased in volume. Musculoskeletal: No chest wall mass or suspicious bone lesions identified. CT ABDOMEN PELVIS FINDINGS Hepatobiliary: No solid liver abnormality is seen. No gallstones, gallbladder wall thickening, or biliary dilatation. Pancreas: Unremarkable. No pancreatic ductal dilatation or surrounding inflammatory changes. Spleen: Normal in size without significant abnormality. Adrenals/Urinary Tract: Adrenal glands are unremarkable. Kidneys are normal, without renal calculi, solid lesion, or hydronephrosis. Large calculus in the dependent bladder (series 2, image 117). Stomach/Bowel: Stomach is within normal limits. Appendix appears normal. No evidence of bowel wall thickening, distention, or inflammatory changes. Sigmoid diverticulosis. Large burden of stool throughout colon and rectum. Vascular/Lymphatic: No significant vascular findings are present. No enlarged abdominal or pelvic lymph nodes. Reproductive: No mass or other abnormality. Other: No abdominal wall hernia or abnormality. No abdominopelvic ascites. Musculoskeletal: No acute or significant osseous findings. IMPRESSION: 1. No significant change in post treatment appearance of a mass of the paramedian right upper lobe. 2. Numerous bilateral pulmonary nodules, some of which are cavitary. Some pulmonary nodules are minimally enlarged; many subcentimeter nodules are not demonstrably changed. Findings are consistent with stable to slightly  worsened pulmonary metastatic disease. 3. Large right pleural effusion, slightly diminished in volume compared to prior examination. Moderate left pleural effusion, slightly increased in volume compared to prior examination. No direct evidence of pleural metastatic disease. 4. No evidence of distant metastatic disease in the abdomen or pelvis. 5. Large calculus in the dependent bladder. Electronically Signed   By: Delanna Ahmadi M.D.   On: 01/25/2021 11:33   CUP PACEART REMOTE DEVICE CHECK  Result Date: 01/23/2021 ILR summary report received. Battery status OK. Normal device function. No new symptom, tachy, brady, or pause episodes. Monthly summary reports and ROV/PRN AF burden 100%, CVR Pt. prescribed Diltiazem, Eliquis LR  IR THORACENTESIS ASP PLEURAL SPACE W/IMG GUIDE  Result Date: 02/03/2021 INDICATION: Patient history of lung cancer with recurrent right-sided pleural effusion. Presents for therapeutic thoracentesis. EXAM: ULTRASOUND GUIDED THERAPEUTIC THORACENTESIS MEDICATIONS: Lidocaine 1% 10 mL COMPLICATIONS: None immediate. PROCEDURE: An ultrasound guided thoracentesis was thoroughly discussed with the patient and questions answered. The benefits, risks, alternatives and complications were also discussed. The patient understands and wishes to proceed with the procedure. Written consent was obtained. Ultrasound was performed to localize and mark an adequate pocket of fluid in the right chest. The area was then prepped and draped in the normal sterile fashion. 1% Lidocaine was used for local anesthesia. Under ultrasound guidance a 19 gauge, 7-cm, Yueh catheter was introduced. Thoracentesis was performed. The catheter was removed and a dressing applied. FINDINGS: A total of approximately 1.45 L of straw-colored fluid was removed. IMPRESSION: Successful ultrasound guided right-sided therapeutic thoracentesis. Read by: Rushie Nyhan, NP Electronically Signed   By: Corrie Mckusick D.O.   On: 02/03/2021  16:09   US Thoracentesis Asp Pleural space w/IMG guide  Result Date: 01/20/2021 INDICATION: Recurrent pleural effusion, lung cancer EXAM: ULTRASOUND GUIDED RIGHT THORACENTESIS MEDICATIONS: None. COMPLICATIONS: None immediate. PROCEDURE: An ultrasound guided thoracentesis was thoroughly discussed with the patient and questions answered. The benefits, risks, alternatives and complications were also discussed. The patient understands and wishes to proceed with the procedure. Written consent was obtained. Ultrasound was performed to localize and mark an adequate pocket of fluid in the right chest. The area was then prepped and draped in the normal sterile fashion. 1% Lidocaine was used for local anesthesia. Under ultrasound guidance a 6 Fr Safe-T-Centesis catheter was introduced. Thoracentesis was performed. The catheter was removed and a dressing applied. FINDINGS: A total of approximately 1.7 L of amber fluid was removed. IMPRESSION: Successful ultrasound guided right thoracentesis yielding 1.7 L of pleural fluid. Read and performed by: Alexandria Lodge, PA-C Electronically Signed   By: Sandi Mariscal M.D.   On: 01/20/2021 10:34    Lab Results: Personally reviewed CBC    Component Value Date/Time   WBC 10.5 02/08/2021 1403   WBC 10.6 (H) 09/24/2019 1203   RBC 2.92 (L) 02/08/2021 1403   HGB 10.7 (L) 02/08/2021 1403   HCT 32.0 (L) 02/08/2021 1403   PLT 385 02/08/2021 1403   MCV 109.6 (H) 02/08/2021 1403   MCH 36.6 (H) 02/08/2021 1403   MCHC 33.4 02/08/2021 1403   RDW 19.3 (H) 02/08/2021 1403   LYMPHSABS 0.6 (L) 02/08/2021 1403   MONOABS 1.1 (H) 02/08/2021 1403   EOSABS 0.1 02/08/2021 1403   BASOSABS 0.1 02/08/2021 1403    BMET    Component Value Date/Time   NA 138 02/08/2021 1403   K 4.6 02/08/2021 1403   CL 104 02/08/2021 1403   CO2 25 02/08/2021 1403   GLUCOSE 102 (H) 02/08/2021 1403   BUN 29 (H) 02/08/2021 1403   CREATININE 0.95 02/08/2021 1403   CREATININE 1.13 10/16/2013 1118    CALCIUM 8.9 02/08/2021 1403   GFRNONAA >60 02/08/2021 1403   GFRAA >60 12/30/2019 0844    BNP No results found for: BNP  ProBNP No results found for: PROBNP  Specialty Problems       Pulmonary Problems   Mass of right lung   Malignant neoplasm of right upper lobe of lung (HCC)   Cough   Pleural effusion    Allergies  Allergen Reactions   Bee Venom     Lip swelling    Immunization History  Administered Date(s) Administered   Fluad Quad(high Dose 65+) 01/06/2020   Influenza,inj,Quad PF,6+ Mos 12/22/2014, 01/10/2016   Influenza-Unspecified 12/02/2018   PFIZER Comirnaty(Gray Top)Covid-19 Tri-Sucrose Vaccine 08/10/2020   PFIZER(Purple Top)SARS-COV-2 Vaccination 04/29/2019, 05/20/2019, 12/09/2019   Pneumococcal Conjugate-13 07/12/2016   Pneumococcal Polysaccharide-23 07/15/2017   Tdap 10/16/2013, 12/22/2014   Zoster, Live 01/21/2015    Past Medical History:  Diagnosis Date   Chicken pox    DM2 (diabetes mellitus, type 2) ([place])    History of kidney stones    passed several    Hypertension    Kidney stones    5-6 times   lung ca 10/2019   Measles    Mumps    Stroke ([place])    x 2, last one was 09/18/2019- has some expressive aphasia    Tobacco History: Social History   Tobacco Use  Smoking Status Never  Smokeless Tobacco Never   Counseling given: Not Answered   Continue to not smoke  Outpatient Encounter Medications as of 02/09/2021  Medication Sig   albuterol (VENTOLIN HFA) 108 (90 Base) MCG/ACT inhaler Inhale 2 puffs into the lungs every 6 (six) hours as needed for wheezing or shortness of breath.   apixaban (ELIQUIS) 5 MG TABS tablet Take 1 tablet (5 mg total) by mouth 2 (two) times daily.   atorvastatin (LIPITOR) 40 MG tablet TAKE ONE TABLET BY MOUTH ONE TIME DAILY 6PM   diltiazem (CARDIZEM CD) 180 MG 24 hr capsule Take 1 capsule (180 mg total) by mouth daily.   diphenhydrAMINE HCl, Sleep, (ZZZQUIL) 25 MG CAPS Take 25 mg by mouth at bedtime.    HYDROcodone bit-homatropine (HYCODAN) 5-1.5 MG/5ML syrup Take 5 mLs by mouth every 6 (six) hours as needed for cough.   lidocaine-prilocaine (EMLA) cream Apply to the Port-A-Cath site 30-60-minute before treatment   metFORMIN (GLUCOPHAGE) 500 MG tablet TAKE TWO TABLETS BY MOUTH TWICE A DAY WITH MEAL(S)   methylPREDNISolone (MEDROL DOSEPAK) 4 MG TBPK tablet Use as instructed.   Omega-3 Fatty Acids (FISH OIL) 1200 MG CAPS Take 1,200 mg by mouth daily.   prochlorperazine (COMPAZINE) 10 MG tablet Take 1 tablet (10 mg total) by mouth every 6 (six) hours as needed.   No facility-administered encounter medications on file as of 02/09/2021.     Review of Systems  Review of Systems  No chest with exertion.  No orthopnea or PND.  Comprehensive review of systems otherwise negative. Physical Exam  BP 124/76   Pulse 82   Ht 6\' 2"  (1.88 m)   Wt 157 lb 8 oz (71.4 kg)   SpO2 98% Comment: on RA  BMI 20.22 kg/m   Wt Readings from Last 5 Encounters:  02/09/21 157 lb 8 oz (71.4 kg)  02/08/21 157 lb 9.6 oz (71.5 kg)  01/25/21 161 lb (73 kg)  01/18/21 164 lb 1.6 oz (74.4 kg)  01/04/21 164 lb (74.4 kg)    BMI Readings from Last 5 Encounters:  02/09/21 20.22 kg/m  02/08/21 20.23 kg/m  01/25/21 20.67 kg/m  01/18/21 21.07 kg/m  01/04/21 21.06 kg/m     Physical Exam General: Thin, in wheelchair Eyes: EOMI, icterus Neck: Supple, JVP appreciated Pulmonary: Distant, absent in bilateral bases, normal work of breathing Cardiovascular: Regular rate and rhythm, no murmurs Abdomen: Nondistended, bowel sounds present MSK: No synovitis, no joint effusion Neuro: In wheelchair, no focal weakness Psych: Normal mood, full affect   Assessment & Plan:   Bilateral, right greater than left effusions: Right predates the left.  Presumably malignant  on the right.  Cytology only sent 09/19/20 without malignant cells present.  Clinically likely malignant.  Left pleural effusion was not present 07/2020 now  present 08/2020 and subsequent CT scans and chest x-rays.  Does seem to have metastatic disease on the left although he states he has no cancer on the left.  Unclear what has  been communicated to him.  Possible this is a malignant effusion on the left as well.  However given the bilateral nature, lower extremity swelling, will evaluate for cardiomyopathy related to chemotherapy, atrial fibrillation, or otherwise with echocardiogram.  Patient is in agreement for Pleurx catheter placement on the right.  Getting thoracentesis every 2 to 3 weeks.  Therapeutic benefit after drainage.  Consider thoracentesis on the left in the future pending results of echocardiogram. --Message colleagues and staff regarding timing of procedure as well as exploring benefits for home health etc for care at home post procedure   Return if symptoms worsen or fail to improve.   Lanier Clam, MD 02/09/2021  I spent 85 minutes in the care of this patient including review of records, face-to-face care, coordination of care.

## 2021-02-10 ENCOUNTER — Encounter: Payer: Self-pay | Admitting: Internal Medicine

## 2021-02-10 ENCOUNTER — Other Ambulatory Visit: Payer: Self-pay | Admitting: Physician Assistant

## 2021-02-10 DIAGNOSIS — R059 Cough, unspecified: Secondary | ICD-10-CM

## 2021-02-10 DIAGNOSIS — C3411 Malignant neoplasm of upper lobe, right bronchus or lung: Secondary | ICD-10-CM

## 2021-02-10 MED ORDER — HYDROCODONE BIT-HOMATROP MBR 5-1.5 MG/5ML PO SOLN: 5.0000 mL | Freq: Four times a day (QID) | ORAL | 0 refills | Status: AC | PRN

## 2021-02-14 ENCOUNTER — Telehealth: Payer: Self-pay

## 2021-02-14 ENCOUNTER — Other Ambulatory Visit: Payer: Self-pay

## 2021-02-14 ENCOUNTER — Encounter (HOSPITAL_BASED_OUTPATIENT_CLINIC_OR_DEPARTMENT_OTHER): Payer: Medicare HMO | Admitting: Internal Medicine

## 2021-02-14 DIAGNOSIS — R634 Abnormal weight loss: Secondary | ICD-10-CM | POA: Diagnosis not present

## 2021-02-14 DIAGNOSIS — Z8673 Personal history of transient ischemic attack (TIA), and cerebral infarction without residual deficits: Secondary | ICD-10-CM | POA: Diagnosis not present

## 2021-02-14 DIAGNOSIS — Z79899 Other long term (current) drug therapy: Secondary | ICD-10-CM | POA: Diagnosis not present

## 2021-02-14 DIAGNOSIS — L89312 Pressure ulcer of right buttock, stage 2: Secondary | ICD-10-CM | POA: Diagnosis not present

## 2021-02-14 DIAGNOSIS — I10 Essential (primary) hypertension: Secondary | ICD-10-CM | POA: Diagnosis not present

## 2021-02-14 DIAGNOSIS — E119 Type 2 diabetes mellitus without complications: Secondary | ICD-10-CM | POA: Diagnosis not present

## 2021-02-14 DIAGNOSIS — Z923 Personal history of irradiation: Secondary | ICD-10-CM | POA: Diagnosis not present

## 2021-02-14 DIAGNOSIS — C3411 Malignant neoplasm of upper lobe, right bronchus or lung: Secondary | ICD-10-CM | POA: Diagnosis not present

## 2021-02-14 NOTE — Telephone Encounter (Signed)
Called and spoke with patient and his wife to let them know the procedure information. They repeated everything back to me and expressed understanding. Advised to hold apixaban starting today and that they would be instructed when to start it back at procedure. Will route to providers as FYI. Nothing further needed at this time.

## 2021-02-14 NOTE — Telephone Encounter (Addendum)
----- Message from Darral Dash, RN sent at 02/13/2021  3:33 PM EST ----- Regarding: RE: PleurX He's scheduled for Thursday at 2p  ----- Message ----- From: Candee Furbish, MD Sent: 02/13/2021   3:03 PM EST To: Darral Dash, RN, Lanier Clam, MD, # Subject: RE: PleurX                                     Yes it's fine  ----- Message ----- From: Darral Dash, RN Sent: 02/13/2021  12:30 PM EST To: Candee Furbish, MD, Lanier Clam, MD, # Subject: RE: Micheal Oliver I can schedule Thursday at 2pm as long as Dr. Tamala Micheal is good with that. ----- Message ----- From: Lanier Clam, MD Sent: 02/13/2021  12:23 PM EST To: Darral Dash, RN, Candee Furbish, MD, # Subject: RE: PleurX                                     If Micheal Oliver is ok with it, can shoot for Thursday, 11/17. Will allow time to hold apixaban. He has multiple appointments Wednesday so may be too busy that day.  ----- Message ----- From: Darral Dash, RN Sent: 02/13/2021  12:12 PM EST To: Candee Furbish, MD, Lanier Clam, MD, # Subject: RE: PleurX                                     We can schedule for whenever this week. Just let me know the date ----- Message ----- From: Candee Furbish, MD Sent: 02/13/2021  11:56 AM EST To: Darral Dash, RN, Lanier Clam, MD, # Subject: RE: PleurX                                     Yeah hold 48h ideally thanks  ----- Message ----- From: Lanier Clam, MD Sent: 02/13/2021  11:55 AM EST To: Darral Dash, RN, Candee Furbish, MD, # Subject: RE: PleurX                                     Any updates regarding scheduling procedure?   Micheal Oliver, he is on apixaban so if you want him to hold prior to PleurX let us know.  Thanks! ----- Message ----- From: Garner Nash, DO Sent: 02/10/2021   1:26 PM EST To: Darral Dash, RN, Candee Furbish, MD, # Subject: RE: PleurX                                      I added Micheal Oliver to the conversation. So whenever is Micheal Oliver available   Micheal Oliver    ----- Message ----- From: Lanier Clam, MD Sent: 02/10/2021  11:16 AM EST To: Garner Nash,  DO, Candee Furbish, MD, # Subject: RE: PleurX                                     Thanks Micheal Oliver!  Micheal Oliver, are there dates available next week?  ----- Message ----- From: Candee Furbish, MD Sent: 02/09/2021   1:43 PM EST To: Garner Nash, DO, Micheal Hy, DO, # Subject: RE: PleurX                                     Can do any day next week if set up.  Micheal Oliver ----- Message ----- From: Lanier Clam, MD Sent: 02/09/2021  11:14 AM EST To: Garner Nash, DO, Candee Furbish, MD, # Subject: PleurX                                         Hello all,  This man has metastatic lung cancer on 3rd line chemo. Loculated R pleural effusion, trapped lung. Receives benefit from [place]. Cytology x 1 negative. Thora every 2-3 weeks. Onc and patient would like a PleurX on right. Has newer effusion on left never tapped. Getting echo, suspect I will tap the left in the future.  Would one of you doctors be willing to do PleurX procedure on right in the coming days? I can manage in clinic after.  Micheal Oliver - can we start exploring benefits for home health etc? Wife was not overly enthusiastic about her managing the drainage but I told her the expectation is that she would drain the catheter after training. Also, they wanted to know how fluid was disposed - I honestly do not know.  Thanks! Micheal Oliver

## 2021-02-15 ENCOUNTER — Inpatient Hospital Stay: Payer: Medicare HMO

## 2021-02-15 ENCOUNTER — Ambulatory Visit: Payer: Medicare HMO | Admitting: Physician Assistant

## 2021-02-15 ENCOUNTER — Inpatient Hospital Stay: Payer: Medicare HMO | Admitting: Dietician

## 2021-02-15 VITALS — BP 137/91 | HR 80 | Temp 98.2°F | Resp 24 | Wt 162.5 lb

## 2021-02-15 DIAGNOSIS — C3411 Malignant neoplasm of upper lobe, right bronchus or lung: Secondary | ICD-10-CM

## 2021-02-15 DIAGNOSIS — Z5111 Encounter for antineoplastic chemotherapy: Secondary | ICD-10-CM | POA: Diagnosis not present

## 2021-02-15 DIAGNOSIS — Z23 Encounter for immunization: Secondary | ICD-10-CM | POA: Diagnosis not present

## 2021-02-15 DIAGNOSIS — Z95828 Presence of other vascular implants and grafts: Secondary | ICD-10-CM

## 2021-02-15 DIAGNOSIS — Z79899 Other long term (current) drug therapy: Secondary | ICD-10-CM | POA: Diagnosis not present

## 2021-02-15 DIAGNOSIS — Z923 Personal history of irradiation: Secondary | ICD-10-CM | POA: Diagnosis not present

## 2021-02-15 LAB — CMP (CANCER CENTER ONLY)
ALT: 53 U/L — ABNORMAL HIGH (ref 0–44)
AST: 49 U/L — ABNORMAL HIGH (ref 15–41)
Albumin: 2.6 g/dL — ABNORMAL LOW (ref 3.5–5.0)
Alkaline Phosphatase: 88 U/L (ref 38–126)
Anion gap: 9 (ref 5–15)
BUN: 30 mg/dL — ABNORMAL HIGH (ref 8–23)
CO2: 27 mmol/L (ref 22–32)
Calcium: 8.9 mg/dL (ref 8.9–10.3)
Chloride: 103 mmol/L (ref 98–111)
Creatinine: 1.06 mg/dL (ref 0.61–1.24)
GFR, Estimated: >60 mL/min (ref >60)
Glucose, Bld: 213 mg/dL — ABNORMAL HIGH (ref 70–99)
Potassium: 4 mmol/L (ref 3.5–5.1)
Sodium: 139 mmol/L (ref 135–145)
Total Bilirubin: 0.6 mg/dL (ref 0.3–1.2)
Total Protein: 5.9 g/dL — ABNORMAL LOW (ref 6.5–8.1)

## 2021-02-15 LAB — CBC WITH DIFFERENTIAL (CANCER CENTER ONLY)
Abs Immature Granulocytes: 0.04 10*3/uL (ref 0.00–0.07)
Basophils Absolute: 0.1 10*3/uL (ref 0.0–0.1)
Basophils Relative: 2 %
Eosinophils Absolute: 0 10*3/uL (ref 0.0–0.5)
Eosinophils Relative: 0 %
HCT: 28.9 % — ABNORMAL LOW (ref 39.0–52.0)
Hemoglobin: 9.6 g/dL — ABNORMAL LOW (ref 13.0–17.0)
Immature Granulocytes: 1 %
Lymphocytes Relative: 12 %
Lymphs Abs: 0.3 10*3/uL — ABNORMAL LOW (ref 0.7–4.0)
MCH: 36 pg — ABNORMAL HIGH (ref 26.0–34.0)
MCHC: 33.2 g/dL (ref 30.0–36.0)
MCV: 108.2 fL — ABNORMAL HIGH (ref 80.0–100.0)
Monocytes Absolute: 0.7 10*3/uL (ref 0.1–1.0)
Monocytes Relative: 25 %
Neutro Abs: 1.6 10*3/uL — ABNORMAL LOW (ref 1.7–7.7)
Neutrophils Relative %: 60 %
Platelet Count: 184 10*3/uL (ref 150–400)
RBC: 2.67 MIL/uL — ABNORMAL LOW (ref 4.22–5.81)
RDW: 17.8 % — ABNORMAL HIGH (ref 11.5–15.5)
WBC Count: 2.8 10*3/uL — ABNORMAL LOW (ref 4.0–10.5)
nRBC: 0.7 % — ABNORMAL HIGH (ref 0.0–0.2)

## 2021-02-15 MED ORDER — SODIUM CHLORIDE 0.9 % IV SOLN: Freq: Once | INTRAVENOUS | Status: AC

## 2021-02-15 MED ORDER — SODIUM CHLORIDE 0.9% FLUSH: 10.0000 mL | INTRAVENOUS | Status: DC | PRN

## 2021-02-15 MED ORDER — SODIUM CHLORIDE 0.9 % IV SOLN
800.0000 mg/m2 | Freq: Once | INTRAVENOUS | Status: AC
  Administered 2021-02-15: 1634 mg via INTRAVENOUS
  Filled 2021-02-15: qty 42.98

## 2021-02-15 MED ORDER — HEPARIN SOD (PORK) LOCK FLUSH 100 UNIT/ML IV SOLN: 500.0000 [IU] | Freq: Once | INTRAVENOUS | Status: DC | PRN

## 2021-02-15 MED ORDER — SODIUM CHLORIDE 0.9% FLUSH
10.0000 mL | INTRAVENOUS | Status: AC | PRN
  Administered 2021-02-15: 10 mL

## 2021-02-15 MED ORDER — PROCHLORPERAZINE MALEATE 10 MG PO TABS
10.0000 mg | ORAL_TABLET | Freq: Once | ORAL | Status: DC
  Filled 2021-02-15: qty 1

## 2021-02-15 NOTE — Patient Instructions (Signed)
[facility]  Discharge Instructions: Thank you for choosing [facility] to provide your oncology and hematology care.   If you have a lab appointment with the [facility], please go directly to the [facility] and check in at the registration area.   Wear comfortable clothing and clothing appropriate for easy access to any Portacath or PICC line.   We strive to give you quality time with your provider. You may need to reschedule your appointment if you arrive late (15 or more minutes).  Arriving late affects you and other patients whose appointments are after yours.  Also, if you miss three or more appointments without notifying the office, you may be dismissed from the clinic at the provider's discretion.      For prescription refill requests, have your pharmacy contact our office and allow 72 hours for refills to be completed.    Today you received the following chemotherapy and/or immunotherapy agents gemzar      To help prevent nausea and vomiting after your treatment, we encourage you to take your nausea medication as directed.  BELOW ARE SYMPTOMS THAT SHOULD BE REPORTED IMMEDIATELY: *FEVER GREATER THAN 100.4 F (38 C) OR HIGHER *CHILLS OR SWEATING *NAUSEA AND VOMITING THAT IS NOT CONTROLLED WITH YOUR NAUSEA MEDICATION *UNUSUAL SHORTNESS OF BREATH *UNUSUAL BRUISING OR BLEEDING *URINARY PROBLEMS (pain or burning when urinating, or frequent urination) *BOWEL PROBLEMS (unusual diarrhea, constipation, pain near the anus) TENDERNESS IN MOUTH AND THROAT WITH OR WITHOUT PRESENCE OF ULCERS (sore throat, sores in mouth, or a toothache) UNUSUAL RASH, SWELLING OR PAIN  UNUSUAL VAGINAL DISCHARGE OR ITCHING   Items with * indicate a potential emergency and should be followed up as soon as possible or go to the Emergency Department if any problems should occur.  Please show the CHEMOTHERAPY ALERT CARD or IMMUNOTHERAPY ALERT CARD at check-in to the  Emergency Department and triage nurse.  Should you have questions after your visit or need to cancel or reschedule your appointment, please contact [facility]  Dept: [phone]  and follow the prompts.  Office hours are 8:00 a.m. to 4:30 p.m. Monday - Friday. Please note that voicemails left after 4:00 p.m. may not be returned until the following business day.  We are closed weekends and major holidays. You have access to a nurse at all times for urgent questions. Please call the main number to the clinic Dept: [phone] and follow the prompts.   For any non-urgent questions, you may also contact your provider using MyChart. We now offer e-Visits for anyone 39 and older to request care online for non-urgent symptoms. For details visit [email or web address].   Also download the MyChart app! Go to the app store, search "MyChart", open the app, select [facility], and log in with your MyChart username and password.  Due to Covid, a mask is required upon entering the hospital/clinic. If you do not have a mask, one will be given to you upon arrival. For doctor visits, patients may have 1 support person aged 48 or older with them. For treatment visits, patients cannot have anyone with them due to current Covid guidelines and our immunocompromised population.

## 2021-02-15 NOTE — Progress Notes (Signed)
Nutrition Follow-up:  Patient with metastatic NSCLC of right lung. He is currently receiving third line chemotherapy with gemcitabine. Patient with recurrent right pleural effusion, he underwent US-guided right-sided thoracentesis on 11/1 yielding 1.45 L of fluid. Patient is scheduled for Pleurx catheter placement on 11/17.   Met with patient during infusion. He reports his appetite is poor, he is unable to eat dry foods including bread, pork, hamburger, and dried beef. He does well with pasta and likes spaghetti with meat sauce if his wife cuts up the noodles into small pieces. He eats eggs, soups, cobb salads, mashed potatoes, chicken and dumplings, sliced cheese. Patient reports he eats a lot of berries and drinks 1 bottle of coke, 2 flavored soda waters, and 2 Ensure Enlive daily. Today, he ate bowl of cinnamon toast crunch with whole milk and [employer] peppermint frosty. Noted patient with stage II pressure injury on 10/5 office visit. Per pt he has been following with wound care for the last 6 weeks, says he was told it was almost completely healed at last visit. Patient reports it continues to be sore. He has occasional nausea, denies vomiting. Patient reports chronic constipation, having bowel movements every other day.    Medications: Hycodan, compazine, metformin  Labs: Glucose 213, BUN 30  Anthropometrics: Weight 162 lb 8 oz today increased from 157 lb 8 oz on 11/10 decreased from 161 lb on 10/26 and 164 lb on 10/5   NUTRITION DIAGNOSIS: Unintended weight loss ongoing    INTERVENTION:  Educated on importance of increased calorie and protein needs to support healing Discussed soft, moist high protein foods that patient could try - handout provided  Encouraged high calorie, high protein foods to promote weight gain Recommend patient drink 3 Ensure Enlive/equivalent daily - coupons provided Sample of vanilla Ensure Enlive provided to patient during infusion  Contact information provided      MONITORING, EVALUATION, GOAL: weight trends, intake    NEXT VISIT: Wednesday, December 7 during infusion

## 2021-02-16 ENCOUNTER — Encounter (HOSPITAL_COMMUNITY): Payer: Self-pay | Admitting: Internal Medicine

## 2021-02-16 ENCOUNTER — Other Ambulatory Visit: Payer: Self-pay

## 2021-02-16 ENCOUNTER — Telehealth: Payer: Self-pay | Admitting: Surgery

## 2021-02-16 ENCOUNTER — Ambulatory Visit (HOSPITAL_COMMUNITY): Payer: Medicare HMO

## 2021-02-16 ENCOUNTER — Encounter (HOSPITAL_COMMUNITY)
Admission: RE | Disposition: A | Discharge status: Home / Self Care | Payer: Self-pay | Source: Home / Self Care | Attending: Internal Medicine

## 2021-02-16 ENCOUNTER — Ambulatory Visit (HOSPITAL_COMMUNITY)
Admission: RE | Admit: 2021-02-16 | Discharge: 2021-02-16 | Disposition: A | Discharge status: Home / Self Care | Payer: Medicare HMO | Attending: Internal Medicine | Admitting: Internal Medicine

## 2021-02-16 DIAGNOSIS — J939 Pneumothorax, unspecified: Secondary | ICD-10-CM | POA: Diagnosis not present

## 2021-02-16 DIAGNOSIS — J9811 Atelectasis: Secondary | ICD-10-CM | POA: Diagnosis not present

## 2021-02-16 DIAGNOSIS — J90 Pleural effusion, not elsewhere classified: Secondary | ICD-10-CM | POA: Insufficient documentation

## 2021-02-16 DIAGNOSIS — Z4682 Encounter for fitting and adjustment of non-vascular catheter: Secondary | ICD-10-CM

## 2021-02-16 DIAGNOSIS — Z9689 Presence of other specified functional implants: Secondary | ICD-10-CM

## 2021-02-16 DIAGNOSIS — R911 Solitary pulmonary nodule: Secondary | ICD-10-CM | POA: Diagnosis not present

## 2021-02-16 DIAGNOSIS — R918 Other nonspecific abnormal finding of lung field: Secondary | ICD-10-CM | POA: Insufficient documentation

## 2021-02-16 HISTORY — PX: THORACENTESIS: SHX235

## 2021-02-16 SURGERY — THORACENTESIS
Anesthesia: LOCAL

## 2021-02-16 NOTE — Discharge Planning (Signed)
Bonnye Halle J. Clydene Laming, RN, BSN, [place] [phone] RNCM spoke with pt at bedside regarding discharge planning for [facility]. Pt chose [facility] to render services. Sharmon Revere of [facility] notified. Patient made aware that [facility] will be in contact in 24-48 hours.  No DME needs identified at this time.

## 2021-02-16 NOTE — Interval H&P Note (Signed)
History and Physical Interval Note:  02/16/2021 3:23 PM  Sheffield Slider  has presented today for surgery, with the diagnosis of pleural effusion.  The various methods of treatment have been discussed with the patient and family. After consideration of risks, benefits and other options for treatment, the patient has consented to PleurX catheter insertion as a surgical intervention.  The patient's history has been reviewed, patient examined, no change in status, stable for surgery.  I have reviewed the patient's chart and labs.  Questions were answered to the patient's satisfaction.     Candee Furbish

## 2021-02-16 NOTE — Progress Notes (Signed)
Micheal Oliver, Micheal Oliver ([id number]) Visit Report for 02/14/2021 Debridement Details Patient Name: Date of Service: Micheal Oliver, Micheal Oliver 02/14/2021 12:30 PM Medical Record Number: [id number] Patient Account Number: [id number] Date of Birth/Sex: Treating RN: September 16, 1950 (70 y.o. Micheal Oliver, Micheal Oliver Primary Care Provider: Lamar Blinks Other Clinician: Referring Provider: Treating Provider/Extender: Delton See in Treatment: 4 Debridement Performed for Assessment: Wound #1 Right Gluteus Performed By: Physician Ricard Dillon., MD Debridement Type: Debridement Level of Consciousness (Pre-procedure): Awake and Alert Pre-procedure Verification/Time Out Yes - 13:15 Taken: Start Time: 13:15 Pain Control: Lidocaine T Area Debrided (L x W): otal 0.2 (cm) x 0.4 (cm) = 0.08 (cm) Tissue and other material debrided: Viable, Non-Viable, Eschar Level: Non-Viable Tissue Debridement Description: Selective/Open Wound Instrument: Curette Bleeding: Minimum Hemostasis Achieved: Pressure End Time: 13:15 Procedural Pain: 0 Post Procedural Pain: 0 Response to Treatment: Procedure was tolerated well Level of Consciousness (Post- Awake and Alert procedure): Post Debridement Measurements of Total Wound Length: (cm) 0.2 Stage: Category/Stage II Width: (cm) 0.4 Depth: (cm) 0.1 Volume: (cm) 0.006 Character of Wound/Ulcer Post Debridement: Improved Post Procedure Diagnosis Same as Pre-procedure Electronic Signature(s) Signed: 02/14/2021 4:37:23 PM By: Linton Ham MD Signed: 02/16/2021 5:03:29 PM By: Rhae Hammock RN Entered By: Linton Ham on 02/14/2021 13:19:52 -------------------------------------------------------------------------------- HPI Details Patient Name: Date of Service: Micheal Oliver 02/14/2021 12:30 PM Medical Record Number: [id number] Patient Account Number: [id number] Date of Birth/Sex: Treating RN: 25-Feb-1951 (70 y.o. 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 He received radiation and is now on his third combination of keynote chemotherapy. Apparently has had limited respone to the prior to rounds of chemotherapy. As result of this he has lost 60 pounds of weight he has a marginal intake. He started to become aware of pain in his buttock area on the right several weeks ago. He asked his oncologist to look at it and they correctly call this is stage II pressure ulcer on the right buttock and referred him here. He has been using Vaseline gauze. He says the area is very painful however it is really quite small in terms of surface area Past medical history includes type 2 diabetes, hypertension, CVA 2 years ago, right upper lobe adeno CA of the lung stage IV 11/1; 2-week follow-up. Wound is smaller but still complaining of a lot of pain in the area. 11/15; using polymen. 2 small eschars which I removed there is still 2 small open areas in her right buttock. He says he is lost another 10 pounds still on chemotherapy Electronic Signature(s) Signed: 02/14/2021 4:37:23 PM By: Linton Ham MD Entered By: Linton Ham on 02/14/2021 13:23:46 -------------------------------------------------------------------------------- Physical Exam Details Patient Name: Date of Service: Micheal Oliver, Micheal Oliver 02/14/2021 12:30 PM Medical Record Number: [id number] Patient Account Number: [id number] Date of Birth/Sex: Treating RN: [birth year] (70 y.o. Micheal Oliver Primary Care Provider: Lamar Blinks Other Clinician: Referring Provider: Treating Provider/Extender: Delton See in Treatment:  4 Constitutional Sitting or standing Blood Pressure is within target range for patient.. Pulse regular and within target range for patient.Marland Kitchen Respirations regular, non-labored and within target range.. Temperature is normal and within the target range for the patient.Marland Kitchen Appears in no distress. Notes Wound exam; right buttock close proximity to the gluteal cleft posteriorly. Only  2 small open open areas remain eschared over I remove the eschar with a #3 curette and these are still not closed. Minimal bleeding Electronic Signature(s) Signed: 02/14/2021 4:37:23 PM By: Linton Ham MD Entered By: Linton Ham on 02/14/2021 13:24:44 -------------------------------------------------------------------------------- Physician Orders Details Patient Name: Date of Service: Micheal Oliver, Micheal Oliver 02/14/2021 12:30 PM Medical Record Number: [id number] Patient Account Number: [id number] Date of Birth/Sex: Treating RN: 25-Sep-1950 (70 y.o. Micheal Oliver Primary Care Provider: Lamar Blinks Other Clinician: Referring Provider: Treating Provider/Extender: Delton See in Treatment: 4 Verbal / Phone Orders: No Diagnosis Coding Follow-up Appointments ppointment in 2 weeks. - Dr. Dellia Nims Return A Bathing/ Shower/ Hygiene May shower and wash wound with soap and water. - with dressing changes Off-Loading Turn and reposition every 2 hours - avoid sleeping in chair, Additional Orders / Instructions Follow Nutritious Diet - 1-2 Ensure per day Wound Treatment Wound #1 - Gluteus Wound Laterality: Right Cleanser: Normal Saline (Generic) Every Other Day/30 Days Discharge Instructions: Cleanse the wound with Normal Saline prior to applying a clean dressing using gauze sponges, not tissue or cotton balls. Cleanser: Soap and Water Every Other Day/30 Days Discharge Instructions: May shower and wash wound with dial antibacterial soap and water prior to dressing change. Prim  Dressing: PolyMem Non-Adhesive Dressing, 4x4 in Every Other Day/30 Days ary Discharge Instructions: Apply to wound bed as instructed Secondary Dressing: Zetuvit Plus Silicone Border Dressing 4x4 (in/in) (Generic) Every Other Day/30 Days Discharge Instructions: Apply silicone border over primary dressing as directed. Electronic Signature(s) Signed: 02/14/2021 4:37:23 PM By: Linton Ham MD Signed: 02/16/2021 5:03:29 PM By: Rhae Hammock RN Entered By: Rhae Hammock on 02/14/2021 13:01:44 -------------------------------------------------------------------------------- Problem List Details Patient Name: Date of Service: Micheal Oliver, Micheal Oliver 02/14/2021 12:30 PM Medical Record Number: [id number] Patient Account Number: [id number] Date of Birth/Sex: Treating RN: [birth year] (70 y.o. Micheal Oliver Primary Care Provider: Lamar Blinks Other Clinician: Referring Provider: Treating Provider/Extender: Delton See in Treatment: 4 Active Problems ICD-10 Encounter Code Description Active Date MDM Diagnosis [phone] Pressure ulcer of right buttock, stage 2 01/16/2021 No Yes R63.4 Abnormal weight loss 01/16/2021 No Yes C34.11 Malignant neoplasm of upper lobe, right bronchus or lung 01/16/2021 No Yes Inactive Problems Resolved Problems Electronic Signature(s) Signed: 02/14/2021 4:37:23 PM By: Linton Ham MD Entered By: Linton Ham on 02/14/2021 13:19:24 -------------------------------------------------------------------------------- Progress Note Details Patient Name: Date of Service: Micheal Oliver 02/14/2021 12:30 PM Medical Record Number: [id number] Patient Account Number: [id number] Date of Birth/Sex: Treating RN: Dec 29, 1950 (70 y.o. 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 He received radiation and is now on his third combination of keynote chemotherapy. Apparently has had limited respone to the prior to rounds of chemotherapy. As result of this he has lost 60 pounds of weight he has a marginal intake. He started to become aware of pain in his buttock area on the right several weeks ago. He asked his oncologist to look at it and they correctly call this is stage II pressure ulcer on the right buttock and referred him here. He has been using Vaseline gauze. He says the area is very painful however it is really quite small in terms of surface area Past medical history includes type 2 diabetes, hypertension, CVA 2  years ago, right upper lobe adeno CA of the lung stage IV 11/1; 2-week follow-up. Wound is smaller but still complaining of a lot of pain in the area. 11/15; using polymen. 2 small eschars which I removed there is still 2 small open areas in her right buttock. He says he is lost another 10 pounds still on chemotherapy Objective Constitutional Sitting or standing Blood Pressure is within target range for patient.. Pulse regular and within target range for patient.Marland Kitchen Respirations regular, non-labored and within target range.. Temperature is normal and within the target range for the patient.Marland Kitchen Appears in no distress. Vitals Time Taken: 12:43 PM, Height: 74 in, Weight: 165 lbs, BMI: 21.2, Temperature: 98.3 F, Pulse: 77 bpm, Respiratory Rate: 17 breaths/min, Blood Pressure: 124/74 mmHg. General Notes: Wound exam; right buttock close proximity to the gluteal cleft posteriorly. Only 2 small open open areas remain eschared over I remove the eschar with a #3 curette and these are still not closed. Minimal bleeding Integumentary (Hair, Skin) Wound #1 status is Open. Original cause of wound was Pressure Injury. The  date acquired was: 12/15/2020. The wound has been in treatment 4 weeks. The wound is located on the Right Gluteus. The wound measures 0.2cm length x 0.4cm width x 0.1cm depth; 0.063cm^2 area and 0.006cm^3 volume. The wound is limited to skin breakdown. There is no tunneling or undermining noted. There is a medium amount of serosanguineous drainage noted. The wound margin is distinct with the outline attached to the wound base. There is large (67-100%) pink granulation within the wound bed. There is no necrotic tissue within the wound bed. Assessment Active Problems ICD-10 Pressure ulcer of right buttock, stage 2 Abnormal weight loss Malignant neoplasm of upper lobe, right bronchus or lung Procedures Wound #1 Pre-procedure diagnosis of Wound #1 is a Pressure Ulcer located on the Right Gluteus . There was a Selective/Open Wound Non-Viable Tissue Debridement with a total area of 0.08 sq cm performed by Ricard Dillon., MD. With the following instrument(s): Curette to remove Viable and Non-Viable tissue/material. Material removed includes Eschar after achieving pain control using Lidocaine. No specimens were taken. A time out was conducted at 13:15, prior to the start of the procedure. A Minimum amount of bleeding was controlled with Pressure. The procedure was tolerated well with a pain level of 0 throughout and a pain level of 0 following the procedure. Post Debridement Measurements: 0.2cm length x 0.4cm width x 0.1cm depth; 0.006cm^3 volume. Post debridement Stage noted as Category/Stage II. Character of Wound/Ulcer Post Debridement is improved. Post procedure Diagnosis Wound #1: Same as Pre-Procedure Plan Follow-up Appointments: Return Appointment in 2 weeks. - Dr. Dellia Nims Bathing/ Shower/ Hygiene: May shower and wash wound with soap and water. - with dressing changes Off-Loading: Turn and reposition every 2 hours - avoid sleeping in chair, Additional Orders / Instructions: Follow  Nutritious Diet - 1-2 Ensure per day WOUND #1: - Gluteus Wound Laterality: Right Cleanser: Normal Saline (Generic) Every Other Day/30 Days Discharge Instructions: Cleanse the wound with Normal Saline prior to applying a clean dressing using gauze sponges, not tissue or cotton balls. Cleanser: Soap and Water Every Other Day/30 Days Discharge Instructions: May shower and wash wound with dial antibacterial soap and water prior to dressing change. Prim Dressing: PolyMem Non-Adhesive Dressing, 4x4 in Every Other Day/30 Days ary Discharge Instructions: Apply to wound bed as instructed Secondary Dressing: Zetuvit Plus Silicone Border Dressing 4x4 (in/in) (Generic) Every Other Day/30 Days Discharge Instructions: Apply silicone border over primary dressing as  directed. #1 I continued with the plain polymen. 2. Hopefully healed by the next time he is here Electronic Signature(s) Signed: 02/14/2021 4:37:23 PM By: Linton Ham MD Entered By: Linton Ham on 02/14/2021 13:25:29 -------------------------------------------------------------------------------- SuperBill Details Patient Name: Date of Service: Micheal Oliver, Micheal Oliver 02/14/2021 Medical Record Number: [id number] Patient Account Number: [id number] Date of Birth/Sex: Treating RN: 21-Aug-1950 (70 y.o. Micheal Oliver Primary Care Provider: Lamar Blinks Other Clinician: Referring Provider: Treating Provider/Extender: Delton See in Treatment: 4 Diagnosis Coding ICD-10 Codes Code Description [phone] Pressure ulcer of right buttock, stage 2 R63.4 Abnormal weight loss C34.11 Malignant neoplasm of upper lobe, right bronchus or lung Facility Procedures CPT4 Code: [id number] Description: 95583 - DEBRIDE WOUND 1ST 20 SQ CM OR < ICD-10 Diagnosis Description L89.312 Pressure ulcer of right buttock, stage 2 Modifier: Quantity: 1 Physician Procedures : CPT4 Code Description Modifier [id number] - WC PHYS  LEVEL 3 - EST PT ICD-10 Diagnosis Description L89.312 Pressure ulcer of right buttock, stage 2 R63.4 Abnormal weight loss Quantity: 1 : [id number] 07460 - WC PHYS DEBR WO ANESTH 20 SQ CM ICD-10 Diagnosis Description L89.312 Pressure ulcer of right buttock, stage 2 Quantity: 1 Electronic Signature(s) Signed: 02/14/2021 4:37:23 PM By: Linton Ham MD Entered By: Linton Ham on 02/14/2021 13:25:59

## 2021-02-16 NOTE — Discharge Instructions (Signed)
PleurX Insertion Procedure Note  Micheal Oliver  [id number]  [birth year]  Date:02/16/21  Time:3:26 PM   Provider Performing:Ghazi Rumpf [name]  Procedure: PleurX Tunneled Pleural Catheter Placement (33354)  Indication(s) Relief of dyspnea from recurrent effusion  Consent Risks of the procedure as well as the alternatives and risks of each were explained to the patient and/or caregiver.  Consent for the procedure was obtained.   Anesthesia {Anesthesia:304960231::"Topical only with 1% lidocaine "}   Time Out Verified patient identification, verified procedure, site/side was marked, verified correct patient position, special equipment/implants available, medications/allergies/relevant history reviewed, required imaging and test results available.   Sterile Technique Maximal sterile technique including sterile barrier drape, hand hygiene, sterile gown, sterile gloves, mask, hair covering.    Procedure Description Ultrasound used to identify appropriate pleural anatomy for placement and overlying skin marked.  Area of drainage cleaned and draped in sterile fashion.   Lidocaine was used to anesthetize the skin and subcutaneous tissue.   1.5 cm incision made overlying fluid and another about 5 cm anterior to this along chest wall.  PleurX catheter inserted in usual sterile fashion using modified seldinger technique.  Interrupted silk sutures placed at catheter insertion and tunneling points which will be removed at later date.  PleurX catheter then hooked to suction.  After fluid aspirated, pleurX capped and sterile dressing applied.   Complications/Tolerance {TGYBWLSLHTDSK:87681::"LXBW; patient tolerated the procedure well."} Chest X-ray is ordered to confirm no post-procedural complication.   EBL {EBL:304960236::"Minimal"}   Specimen(s) {Specimen:[id number]::"none"}

## 2021-02-16 NOTE — Progress Notes (Signed)
1900cc straw colored fluid removed from right lung by dr.

## 2021-02-16 NOTE — Telephone Encounter (Signed)
ED RNCM faxed in Pleurx orders for Catheter Kits to [facility] medical supplies [phone]. Patient has been set up with [facility]  services.

## 2021-02-16 NOTE — Progress Notes (Signed)
Micheal Oliver, Micheal Oliver ([id number]) Visit Report for 02/14/2021 Arrival Information Details Patient Name: Date of Service: Micheal Oliver, Micheal Oliver 02/14/2021 12:30 PM Medical Record Number: [id number] Patient Account Number: [id number] Date of Birth/Sex: Treating RN: 12/02/1950 (70 y.o. Micheal Oliver, [name] Primary Care Quetzaly Ebner: Lamar Blinks Other Clinician: Referring Emiliano Welshans: Treating [name]/Extender: Micheal Oliver in Treatment: 4 Visit Information History Since Last Visit Added or deleted any medications: No Patient Arrived: Wheel Chair Any new allergies or adverse reactions: No Arrival Time: 12:42 Had a fall or experienced change in No Accompanied By: wife activities of daily living that may affect Transfer Assistance: Manual risk of falls: Patient Identification Verified: Yes Signs or symptoms of abuse/neglect since last visito No Secondary Verification Process Completed: Yes Hospitalized since last visit: No Patient Requires Transmission-Based Precautions: No Implantable device outside of the clinic excluding No Patient Has Alerts: No cellular tissue based products placed in the center since last visit: Has Dressing in Place as Prescribed: Yes Pain Present Now: Yes Electronic Signature(s) Signed: 02/16/2021 5:03:29 PM By: Rhae Hammock RN Entered By: Rhae Hammock on 02/14/2021 12:43:04 -------------------------------------------------------------------------------- Encounter Discharge Information Details Patient Name: Date of Service: Micheal Oliver 02/14/2021 12:30 PM Medical Record Number: [id number] Patient Account Number: [id number] Date of Birth/Sex: Treating RN: 16-May-1950 (70 y.o. Micheal Oliver Primary Care Santosh Petter: Lamar Blinks Other Clinician: Referring Logun Colavito: Treating [name]/Extender: Micheal Oliver in Treatment: 4 Encounter Discharge Information Items Post Procedure Vitals Discharge  Condition: Stable Temperature (F): 97.4 Ambulatory Status: Wheelchair Pulse (bpm): 74 Discharge Destination: Home Respiratory Rate (breaths/min): 17 Transportation: Private Auto Blood Pressure (mmHg): 134/73 Accompanied By: self Schedule Follow-up Appointment: Yes Clinical Summary of Care: Patient Declined Electronic Signature(s) Signed: 02/16/2021 5:03:29 PM By: Rhae Hammock RN Entered By: Rhae Hammock on 02/14/2021 13:22:02 -------------------------------------------------------------------------------- Lower Extremity Assessment Details Patient Name: Date of Service: Micheal Oliver, Micheal Oliver 02/14/2021 12:30 PM Medical Record Number: [id number] Patient Account Number: [id number] Date of Birth/Sex: Treating RN: 1951/02/07 (70 y.o. Micheal Oliver Primary Care Daiveon Markman: Lamar Blinks Other Clinician: Referring Shelton Square: Treating [name]/Extender: Micheal Oliver in Treatment: 4 Electronic Signature(s) Signed: 02/16/2021 5:03:29 PM By: Rhae Hammock RN Entered By: Rhae Hammock on 02/14/2021 12:44:07 -------------------------------------------------------------------------------- Multi Wound Chart Details Patient Name: Date of Service: Micheal Oliver, Micheal Oliver 02/14/2021 12:30 PM Medical Record Number: [id number] Patient Account Number: [id number] Date of Birth/Sex: Treating RN: Mar 18, 1951 (70 y.o. Micheal Oliver Primary Care Orlin Kann: Lamar Blinks Other Clinician: Referring Epic Tribbett: Treating [name]/Extender: Micheal Oliver in Treatment: 4 Vital Signs Height(in): 74 Pulse(bpm): 42 Weight(lbs): 165 Blood Pressure(mmHg): 124/74 Body Mass Index(BMI): 21 Temperature(F): 98.3 Respiratory Rate(breaths/min): 17 Photos: [N/A:N/A] Right Gluteus N/A N/A Wound Location: Pressure Injury N/A N/A Wounding Event: Pressure Ulcer N/A N/A Primary Etiology: Arrhythmia, Hypertension, Type II N/A  N/A Comorbid History: Diabetes, Received Chemotherapy, Received Radiation, Anorexia/bulimia 12/15/2020 N/A N/A Date Acquired: 4 N/A N/A Weeks of Treatment: Open N/A N/A Wound Status: 0.2x0.4x0.1 N/A N/A Measurements L x W x D (cm) 0.063 N/A N/A A (cm) : rea 0.006 N/A N/A Volume (cm) : 90.00% N/A N/A % Reduction in A rea: 90.50% N/A N/A % Reduction in Volume: Category/Stage II N/A N/A Classification: Medium N/A N/A Exudate A mount: Serosanguineous N/A N/A Exudate Type: red, brown N/A N/A Exudate Color: Distinct, outline attached N/A N/A Wound Margin: Large (67-100%) N/A N/A Granulation A mount: Pink N/A N/A Granulation Quality: None Present (0%) N/A N/A Necrotic A mount: Fascia: No N/A N/A Exposed Structures: Fat Layer (Subcutaneous Tissue): No  Tendon: No Muscle: No Joint: No Bone: No Limited to Skin Breakdown Large (67-100%) N/A N/A Epithelialization: Debridement - Selective/Open Wound N/A N/A Debridement: Pre-procedure Verification/Time Out 13:15 N/A N/A Taken: Lidocaine N/A N/A Pain Control: Necrotic/Eschar N/A N/A Tissue Debrided: Non-Viable Tissue N/A N/A Level: 0.08 N/A N/A Debridement A (sq cm): rea Curette N/A N/A Instrument: Minimum N/A N/A Bleeding: Pressure N/A N/A Hemostasis A chieved: 0 N/A N/A Procedural Pain: 0 N/A N/A Post Procedural Pain: Procedure was tolerated well N/A N/A Debridement Treatment Response: 0.2x0.4x0.1 N/A N/A Post Debridement Measurements L x W x D (cm) 0.006 N/A N/A Post Debridement Volume: (cm) Category/Stage II N/A N/A Post Debridement Stage: Debridement N/A N/A Procedures Performed: Treatment Notes Electronic Signature(s) Signed: 02/14/2021 4:37:23 PM By: Linton Ham MD Signed: 02/16/2021 5:03:29 PM By: Rhae Hammock RN Entered By: Linton Ham on 02/14/2021 13:19:37 -------------------------------------------------------------------------------- Multi-Disciplinary Care Plan  Details Patient Name: Date of Service: Micheal Oliver, Micheal Oliver 02/14/2021 12:30 PM Medical Record Number: [id number] Patient Account Number: [id number] Date of Birth/Sex: Treating RN: 1950-10-07 (70 y.o. Micheal Oliver, Micheal Oliver Primary Care Yasaman Kolek: Lamar Blinks Other Clinician: Referring Blane Worthington: Treating Kelvon Giannini/Extender: Micheal Oliver in Treatment: 4 Multidisciplinary Care Plan reviewed with physician Active Inactive Nutrition Nursing Diagnoses: Imbalanced nutrition Potential for alteratiion in Nutrition/Potential for imbalanced nutrition Goals: Patient/caregiver will maintain therapeutic glucose control Date Initiated: 01/16/2021 Target Resolution Date: 02/13/2021 Goal Status: Active Interventions: Assess HgA1c results as ordered upon admission and as needed Assess patient nutrition upon admission and as needed per policy Provide education on nutrition Treatment Activities: Dietary management education, guidance and counseling : 01/16/2021 Education provided on Nutrition : 02/14/2021 Patient referred to Primary Care Physician for further nutritional evaluation : 01/16/2021 Notes: Pressure Nursing Diagnoses: Knowledge deficit related to causes and risk factors for pressure ulcer development Knowledge deficit related to management of pressures ulcers Potential for impaired tissue integrity related to pressure, friction, moisture, and shear Goals: Patient/caregiver will verbalize understanding of pressure ulcer management Date Initiated: 01/16/2021 Target Resolution Date: 02/13/2021 Goal Status: Active Interventions: Assess offloading mechanisms upon admission and as needed Assess potential for pressure ulcer upon admission and as needed Provide education on pressure ulcers Notes: Wound/Skin Impairment Nursing Diagnoses: Impaired tissue integrity Knowledge deficit related to ulceration/compromised skin integrity Goals: Patient/caregiver will  verbalize understanding of skin care regimen Date Initiated: 01/16/2021 Target Resolution Date: 02/13/2021 Goal Status: Active Ulcer/skin breakdown will have a volume reduction of 30% by week 4 Date Initiated: 01/16/2021 Target Resolution Date: 02/13/2021 Goal Status: Active Interventions: Assess patient/caregiver ability to obtain necessary supplies Assess patient/caregiver ability to perform ulcer/skin care regimen upon admission and as needed Assess ulceration(s) every visit Provide education on ulcer and skin care Treatment Activities: Skin care regimen initiated : 01/16/2021 Topical wound management initiated : 01/16/2021 Notes: Electronic Signature(s) Signed: 02/16/2021 5:03:29 PM By: Rhae Hammock RN Entered By: Rhae Hammock on 02/14/2021 13:02:01 -------------------------------------------------------------------------------- Pain Assessment Details Patient Name: Date of Service: Micheal Oliver, Micheal Oliver 02/14/2021 12:30 PM Medical Record Number: [id number] Patient Account Number: [id number] Date of Birth/Sex: Treating RN: [birth year] (70 y.o. Micheal Oliver Primary Care Ezekial Arns: Lamar Blinks Other Clinician: Referring Khole Arterburn: Treating Liliauna Santoni/Extender: Micheal Oliver in Treatment: 4 Active Problems Location of Pain Severity and Description of Pain Patient Has Paino Yes Site Locations Pain Location: Pain Location: Pain in Ulcers Duration of the Pain. Constant / Intermittento Intermittent Rate the pain. Current Pain Level: 4 Worst Pain Level: 10 Least Pain Level: 0 Tolerable Pain Level: 4 Character of Pain Describe the  Pain: Aching Pain Management and Medication Current Pain Management: Medication: No Cold Application: No Rest: No Massage: No Activity: No T.E.N.S.: No Heat Application: No Leg drop or elevation: No Is the Current Pain Management Adequate: Adequate How does your wound impact your activities of  daily livingo Sleep: No Bathing: No Appetite: No Relationship With Others: No Bladder Continence: No Emotions: No Bowel Continence: No Work: No Toileting: No Drive: No Dressing: No Hobbies: No Electronic Signature(s) Signed: 02/16/2021 5:03:29 PM By: Rhae Hammock RN Entered By: Rhae Hammock on 02/14/2021 12:43:58 -------------------------------------------------------------------------------- Patient/Caregiver Education Details Patient Name: Date of Service: Micheal Oliver 11/15/2022andnbsp12:30 PM Medical Record Number: [id number] Patient Account Number: [id number] Date of Birth/Gender: Treating RN: March 11, 1951 (70 y.o. Micheal Oliver Primary Care Physician: Lamar Blinks Other Clinician: Referring Physician: Treating Physician/Extender: Micheal Oliver in Treatment: 4 Education Assessment Education Provided To: Patient Education Topics Provided Nutrition: Methods: Explain/Verbal Responses: State content correctly Electronic Signature(s) Signed: 02/16/2021 5:03:29 PM By: Rhae Hammock RN Entered By: Rhae Hammock on 02/14/2021 12:57:03 -------------------------------------------------------------------------------- Wound Assessment Details Patient Name: Date of Service: Micheal Oliver, Micheal Oliver 02/14/2021 12:30 PM Medical Record Number: [id number] Patient Account Number: [id number] Date of Birth/Sex: Treating RN: March 29, 1951 (70 y.o. Micheal Oliver, Micheal Oliver Primary Care Latisia Hilaire: Lamar Blinks Other Clinician: Referring Nilo Fallin: Treating Anasha Perfecto/Extender: Micheal Oliver in Treatment: 4 Wound Status Wound Number: 1 Primary Pressure Ulcer Etiology: Wound Location: Right Gluteus Wound Open Wounding Event: Pressure Injury Status: Date Acquired: 12/15/2020 Comorbid Arrhythmia, Hypertension, Type II Diabetes, Received Weeks Of Treatment: 4 History: Chemotherapy, Received Radiation,  Anorexia/bulimia Clustered Wound: No Photos Wound Measurements Length: (cm) 0.2 Width: (cm) 0.4 Depth: (cm) 0.1 Area: (cm) 0.063 Volume: (cm) 0.006 % Reduction in Area: 90% % Reduction in Volume: 90.5% Epithelialization: Large (67-100%) Tunneling: No Undermining: No Wound Description Classification: Category/Stage II Wound Margin: Distinct, outline attached Exudate Amount: Medium Exudate Type: Serosanguineous Exudate Color: red, brown Foul Odor After Cleansing: No Slough/Fibrino No Wound Bed Granulation Amount: Large (67-100%) Exposed Structure Granulation Quality: Pink Fascia Exposed: No Necrotic Amount: None Present (0%) Fat Layer (Subcutaneous Tissue) Exposed: No Tendon Exposed: No Muscle Exposed: No Joint Exposed: No Bone Exposed: No Limited to Skin Breakdown Treatment Notes Wound #1 (Gluteus) Wound Laterality: Right Cleanser Normal Saline Discharge Instruction: Cleanse the wound with Normal Saline prior to applying a clean dressing using gauze sponges, not tissue or cotton balls. Soap and Water Discharge Instruction: May shower and wash wound with dial antibacterial soap and water prior to dressing change. Peri-Wound Care Topical Primary Dressing PolyMem Non-Adhesive Dressing, 4x4 in Discharge Instruction: Apply to wound bed as instructed Secondary Dressing Zetuvit Plus Silicone Border Dressing 4x4 (in/in) Discharge Instruction: Apply silicone border over primary dressing as directed. Secured With Compression Wrap Compression Stockings Environmental education officer) Signed: 02/16/2021 5:03:29 PM By: Rhae Hammock RN Entered By: Rhae Hammock on 02/14/2021 12:49:13 -------------------------------------------------------------------------------- Vitals Details Patient Name: Date of Service: Micheal Oliver 02/14/2021 12:30 PM Medical Record Number: [id number] Patient Account Number: [id number] Date of Birth/Sex: Treating RN: April 14, 1950 (70  y.o. Micheal Oliver, Micheal Oliver Primary Care Shandelle Borrelli: Lamar Blinks Other Clinician: Referring Earlyne Feeser: Treating Elexia Friedt/Extender: Micheal Oliver in Treatment: 4 Vital Signs Time Taken: 12:43 Temperature (F): 98.3 Height (in): 74 Pulse (bpm): 77 Weight (lbs): 165 Respiratory Rate (breaths/min): 17 Body Mass Index (BMI): 21.2 Blood Pressure (mmHg): 124/74 Reference Range: 80 - 120 mg / dl Electronic Signature(s) Signed: 02/16/2021 5:03:29 PM By: Rhae Hammock RN Entered By: Rhae Hammock on 02/14/2021 12:43:30

## 2021-02-17 ENCOUNTER — Telehealth: Payer: Self-pay

## 2021-02-17 ENCOUNTER — Telehealth: Payer: Self-pay | Admitting: Internal Medicine

## 2021-02-17 ENCOUNTER — Telehealth: Payer: Self-pay | Admitting: Pulmonary Disease

## 2021-02-17 NOTE — Progress Notes (Signed)
[facility] [employer], [place], [place] [address] [phone] [id number]  Date:  02/20/2021   Name:  Micheal Oliver   DOB:  02-19-1951   MRN:  [id number]  PCP:  Micheal Mclean, MD    Chief Complaint: 6 month follow up (Concerns/ questions: edema in the feet/Flu shot today: advised against/)   History of Present Illness:  Micheal Oliver is a 70 y.o. very pleasant male patient who presents with the following:  Pt seen today for periodic follow-up  History of hypertension, diabetes, hyperlipidemia, family history of early CAD, ED, atrial fibrillation, stage IV lung cancer which was diagnosed during admission for other causes in July 2021, history of stroke  Last visit with myself was in May   He is taking metformin 1000 once or twice a day depending on how much he eats   Lab Results  Component Value Date   HGBA1C 6.6 (H) 08/22/2020    He was recently admitted for placement of a right lung catheter due to need for frequent thoracentesis  He is getting 500 ml from his lung at least - he is breathing much easier and notes improvement in his stamina (which is still poor) He has given up driving, does not leave home alone Here today with his son  Seen by oncology- Dr Micheal Oliver- earlier this month DIAGNOSIS: Stage IV (T3, N2, M1a) non-small cell lung cancer, adenocarcinoma presented with large right upper lobe lung mass extending to the right suprahilar region in addition to right hilar and mediastinal lymphadenopathy with obstruction of the associated upper lobe bronchus and bilateral subcentimeter pulmonary nodules diagnosed in July 2021.  He is currently undergoing palliative systemic chemotherapy with single agent gemcitabine 1000 MG/M2 IV on days 1 and 8 every 3 weeks.  Status post 10 cycles.  The patient has been tolerating this treatment well except for fatigue and the chemotherapy-induced neutropenia after cycle  #6. Starting from cycle #7 his dose of gemcitabine was reduced to 800 Mg/M2 on days 1 and 8 every 3 weeks. The patient tolerated the last cycle of his treatment well except for fatigue and also chemotherapy-induced neutropenia and he missed day 8 of cycle #10. CBC today showed improvement of his blood count and the patient will proceed with day 1 of cycle #11 today as planned. For the recurrent right pleural effusion, he underwent ultrasound-guided right thoracentesis last week with improvement of his symptoms.  He is meeting with pulmonary medicine for consideration of Pleurx catheter placement. For the persistent cough, I will give him refill of Hycodan. The patient was advised to call immediately if he has any other concerning symptoms in the interval.   He has an ulcer on his behind- this has been there for a few months.  He is seeing wound care every other week, and they are doing dressings at home This does seem to be getting better He is using a pillow for sitting  He notes swelling in his feet Getting worse Can be uncomfortable esp if he is walking  Does not seem to vary especially duinng the day- stays stable from day to night   His overall pain is not bad He is getting a lot of famiy support  Patient Active Problem List   Diagnosis Date Noted   Neutropenia ([place]) 01/25/2021   Pleural effusion 01/18/2021   Pressure ulcer 01/04/2021   Chemotherapy induced neutropenia ([place]) 11/02/2020   Paroxysmal atrial fibrillation ([place]) 04/19/2020  Secondary hypercoagulable state ([place]) 04/19/2020   Thyroid nodule 11/26/2019   Oral thrush 11/10/2019   Cough 11/10/2019   Encounter for antineoplastic chemotherapy 10/28/2019   Encounter for antineoplastic immunotherapy 10/28/2019   Goals of care, counseling/discussion 10/15/2019   Malignant neoplasm of right upper lobe of lung ([place]) 10/11/2019   Mediastinal lymphadenopathy 09/30/2019   Mass of right lung 09/24/2019   Stroke ([place]) 09/18/2019    Hypertension associated with diabetes ([place]) 09/18/2019   Hyperlipidemia associated with type 2 diabetes mellitus ([place]) 09/18/2019   Eyelid twitch 09/28/2018   Controlled type 2 diabetes mellitus without complication, without long-term current use of insulin ([place]) 07/12/2016   Hyperlipidemia 01/10/2016   Family history of early CAD 07/11/2015   Essential hypertension, benign 01/29/2014   Erectile dysfunction 10/16/2013    Past Medical History:  Diagnosis Date   Chicken pox    DM2 (diabetes mellitus, type 2) ([place])    History of kidney stones    passed several    Hypertension    Kidney stones    5-6 times   lung ca 10/2019   Measles    Mumps    Stroke ([place])    x 2, last one was 09/18/2019- has some expressive aphasia    Past Surgical History:  Procedure Laterality Date   COLONOSCOPY W/ POLYPECTOMY     IR IMAGING GUIDED PORT INSERTION  12/12/2020   IR THORACENTESIS ASP PLEURAL SPACE W/IMG GUIDE  02/03/2021   THORACENTESIS N/A 02/16/2021   Procedure: Micheal Oliver;  Surgeon: Micheal Furbish, MD;  Location: [facility] ENDOSCOPY;  Service: Pulmonary;  Laterality: N/A;   VIDEO BRONCHOSCOPY WITH ENDOBRONCHIAL ULTRASOUND N/A 09/30/2019   Procedure: VIDEO BRONCHOSCOPY WITH ENDOBRONCHIAL ULTRASOUND;  Surgeon: Micheal Gobble, MD;  Location: [facility] OR;  Service: Thoracic;  Laterality: N/A;   WISDOM TOOTH EXTRACTION  1976-77    Social History   Tobacco Use   Smoking status: Never   Smokeless tobacco: Never  Vaping Use   Vaping Use: Never used  Substance Use Topics   Alcohol use: No   Drug use: No    Family History  Problem Relation Age of Onset   Alcohol abuse Father 84       Deceased   Heart disease Father    Lung disease Father    Hypertension Father    Stomach cancer Mother 4       Deceased   Hypertension Paternal Grandmother    Dementia Paternal Grandmother    Stroke Paternal Grandmother    Arthritis Maternal Grandmother    Sudden death Paternal Grandfather    Sudden death  Maternal Grandfather    Heart attack Paternal Aunt    Other Maternal Aunt        Natural Causes   Healthy Son    Anxiety disorder Daughter     Allergies  Allergen Reactions   Bee Venom Swelling    Lip swelling    Medication list has been reviewed and updated.  Current Outpatient Medications on File Prior to Visit  Medication Sig Dispense Refill   albuterol (VENTOLIN HFA) 108 (90 Base) MCG/ACT inhaler Inhale 2 puffs into the lungs every 6 (six) hours as needed for wheezing or shortness of breath. 8 g 2   apixaban (ELIQUIS) 5 MG TABS tablet Take 1 tablet (5 mg total) by mouth 2 (two) times daily. 60 tablet 11   atorvastatin (LIPITOR) 40 MG tablet TAKE ONE TABLET BY MOUTH ONE TIME DAILY 6PM 90 tablet 3   diltiazem (CARDIZEM CD)  180 MG 24 hr capsule Take 1 capsule (180 mg total) by mouth daily. (Patient taking differently: Take 180 mg by mouth every evening.) 30 capsule 6   diphenhydrAMINE HCl, Sleep, (ZZZQUIL) 25 MG CAPS Take 25 mg by mouth at bedtime.     HYDROcodone bit-homatropine (HYCODAN) 5-1.5 MG/5ML syrup Take 5 mLs by mouth every 6 (six) hours as needed for cough. 473 mL 0   lidocaine-prilocaine (EMLA) cream Apply to the Port-A-Cath site 30-60-minute before treatment 30 g 0   MELATONIN PO Take 5 mg by mouth at bedtime as needed (sleep).     metFORMIN (GLUCOPHAGE) 500 MG tablet TAKE TWO TABLETS BY MOUTH TWICE A DAY WITH MEAL(S) 400 tablet 3   Omega-3 Fatty Acids (FISH OIL) 1200 MG CAPS Take 1,200 mg by mouth in the morning.     prochlorperazine (COMPAZINE) 10 MG tablet Take 1 tablet (10 mg total) by mouth every 6 (six) hours as needed. 30 tablet 2   No current facility-administered medications on file prior to visit.    Review of Systems:  As per HPI- otherwise negative.   Physical Examination: Vitals:   02/20/21 1017  BP: 120/70  Pulse: 74  Resp: 18  Temp: 97.8 F (36.6 C)  SpO2: 99%   Vitals:   02/20/21 1017  Weight: 157 lb 3.2 oz (71.3 kg)   Body mass index  is 20.18 kg/m. Ideal Body Weight:    GEN: no acute distress. Thin, appears chronically ill HEENT: Atraumatic, Normocephalic.  Ears and Nose: No external deformity. CV: rate controlled a fib, No M/G/R. No JVD. No thrill. No extra heart sounds. PULM: CTA B, no wheezes, crackles, rhonchi. No retractions. No resp. distress. No accessory muscle use. ABD: S, NT, ND Right pleural catheter in place  EXTR: No c/c PSYCH: Normally interactive. Conversant.  He has dry peeling skin on both legs Mild swelling of both feet and calves Foot pulses ok  Mild redness and tenderness of left mid calf- suspicious for cellulitis     Assessment and Plan: Controlled type 2 diabetes mellitus without complication, without long-term current use of insulin (HCC) - Plan: Hemoglobin A1c  Malignant neoplasm of right upper lobe of lung (HCC)  Hyperlipidemia, unspecified hyperlipidemia type  History of CVA (cerebrovascular accident)  Essential hypertension, benign  Cellulitis of left lower extremity - Plan: cephALEXin (KEFLEX) 500 MG capsule  Dry skin dermatitis  Pt with advanced lung cancer - he is feeling more comfortable now with pleural catheter  A1c today- asked him to cut down on metformin to max 1000 daily  May decrease more as illness progresses Treat cellulitis with keflex Went over strategies to manage his swelling and dry skin to reduce risk of cellulitis    Signed Lamar Blinks, MD

## 2021-02-17 NOTE — Telephone Encounter (Signed)
I have called and spoke with Malachy Mood with [facility] and she stated that they have a nurse scheduled to go out to train the family on the pluerex either Sunday or Monday.  She just needed the verbal order for this.  She will get this written up and then get this faxed to [place].  Nothing further is needed

## 2021-02-17 NOTE — Telephone Encounter (Signed)
Spoke with the pt and gave appt information 02-21-21 at 10:30 am  Nothing further needed

## 2021-02-17 NOTE — Telephone Encounter (Signed)
-----   Message from Candee Furbish, MD sent at 02/16/2021  3:24 PM EST ----- Regarding: PleurX drainage training and home health setup Patient had pleurX placed today.  Wife was instructed on drainage but wanted her son trained and he is not off work until next week.  Wife is also interested in home health to potentially drain (she said she will pay out of pocket).  Dr. Silas Flood and triage do you mind helping coordinate the following:  (1) Appts Mon or Tues next week with someone to train wife/son on draining at home; if this is overwhelming then they will need assistance finding a company that will send someone to help them drain. (2) Appt Mon or Tues Thanksgiving week to remove sutures.  Thank you Linna Hoff

## 2021-02-17 NOTE — Telephone Encounter (Signed)
LMTCB   Appt made to Tuesday next week.   Will route to triage to hold and await a call back or try back later.

## 2021-02-17 NOTE — Telephone Encounter (Signed)
Sch per 11/10 los, pt wife aware

## 2021-02-18 DIAGNOSIS — C3490 Malignant neoplasm of unspecified part of unspecified bronchus or lung: Secondary | ICD-10-CM | POA: Diagnosis not present

## 2021-02-18 DIAGNOSIS — J910 Malignant pleural effusion: Secondary | ICD-10-CM | POA: Diagnosis not present

## 2021-02-20 ENCOUNTER — Ambulatory Visit (INDEPENDENT_AMBULATORY_CARE_PROVIDER_SITE_OTHER): Payer: Medicare HMO | Admitting: Family Medicine

## 2021-02-20 ENCOUNTER — Encounter: Payer: Self-pay | Admitting: Family Medicine

## 2021-02-20 ENCOUNTER — Other Ambulatory Visit: Payer: Self-pay

## 2021-02-20 ENCOUNTER — Telehealth: Payer: Self-pay | Admitting: Primary Care

## 2021-02-20 ENCOUNTER — Encounter (HOSPITAL_COMMUNITY): Payer: Self-pay | Admitting: Internal Medicine

## 2021-02-20 VITALS — BP 120/70 | HR 74 | Temp 97.8°F | Resp 18 | Wt 157.2 lb

## 2021-02-20 DIAGNOSIS — E119 Type 2 diabetes mellitus without complications: Secondary | ICD-10-CM

## 2021-02-20 DIAGNOSIS — L853 Xerosis cutis: Secondary | ICD-10-CM | POA: Diagnosis not present

## 2021-02-20 DIAGNOSIS — I10 Essential (primary) hypertension: Secondary | ICD-10-CM

## 2021-02-20 DIAGNOSIS — E785 Hyperlipidemia, unspecified: Secondary | ICD-10-CM | POA: Diagnosis not present

## 2021-02-20 DIAGNOSIS — C3411 Malignant neoplasm of upper lobe, right bronchus or lung: Secondary | ICD-10-CM | POA: Diagnosis not present

## 2021-02-20 DIAGNOSIS — J90 Pleural effusion, not elsewhere classified: Secondary | ICD-10-CM

## 2021-02-20 DIAGNOSIS — L03116 Cellulitis of left lower limb: Secondary | ICD-10-CM | POA: Diagnosis not present

## 2021-02-20 DIAGNOSIS — Z8673 Personal history of transient ischemic attack (TIA), and cerebral infarction without residual deficits: Secondary | ICD-10-CM

## 2021-02-20 LAB — HEMOGLOBIN A1C: Hgb A1c MFr Bld: 6.3 % (ref 4.6–6.5)

## 2021-02-20 MED ORDER — CEPHALEXIN 500 MG PO CAPS
500.0000 mg | ORAL_CAPSULE | Freq: Three times a day (TID) | ORAL | 0 refills | Status: DC
Start: 2021-02-20 — End: 2021-03-10

## 2021-02-20 NOTE — Patient Instructions (Addendum)
Ok to decrease metformin to 500 twice a day - I will be in touch with your A1c asap  Try a thick moisturizing cream such as Cetaphil on your legs- we want to keep the skin in good condition to reduce cracking and risk of infection  Take the keflex three times a day for one week for cellulitis (soft tissue infection) of the left leg Compression may be helpful for the swelling- ok to use if comfortable for you  Let me know if I can do anything to help you.  If all is well please see me in 6 months

## 2021-02-20 NOTE — Telephone Encounter (Signed)
Micheal Oliver, I put an order in, thanks!

## 2021-02-20 NOTE — Telephone Encounter (Signed)
I don't see that an order for Home Health has been placed

## 2021-02-20 NOTE — Telephone Encounter (Signed)
Family is interested in getting home health to help with pleurx catheter drainage. They are willing to self pay if needed. Can we look into options?

## 2021-02-20 NOTE — Progress Notes (Signed)
@Patient  ID: Micheal Oliver, male    DOB: 1950-12-18, 70 y.o.   MRN: [id number]  Chief Complaint  Patient presents with   Follow-up    Referring provider: Copland, Gay Filler, MD  HPI: 70 year old male, never smoked. PMH significant for HNT, mediastinal lymphadenopathy, afib, stroke, malignant neoplasm RUL, pleural effusion, type 2 diabetes, hyperlipidemia. Patient of Dr. Silas Flood, last seen on 02/09/21.   Previous [facility] pulmonary encounter: 02/09/21- Dr. Silas Flood  Patient here to discuss Pleurx catheter.  He has had multiple thoracentesis over the last several months.  Initially got 2.45 L off on review.  Subsequently having decreasing amounts of fluid removed each time, most recently about 1.5 L.  Having to stop because of chest pain.  High suspicion for lung entrapment or trapped lung.  Discussed with patient.  His need for thoracentesis is prompted by increasing dyspnea.  After thoracentesis, his dyspnea improved.  He had cytology sent 08/2020 without evidence of malignant cells.  No further cytology sent.  Discussed at length that most likely the pleural effusion is malignant in nature.  Notably, he has a left-sided effusion.  Was not present on CT scan 07/2020 present on CT scan suspect 2022.  Present on subsequent CT scans and chest x-rays on review of serial images.  Most recent CT chest personally viewed interpreted as large mass on the right, satellite pulmonary metastases seen in the left lung, bilateral right greater than left effusions.  Effusions are loculated.  Etiology of the left pleural effusion was less clear.  Possibly malignant given metastases.  Possible third days seen in the setting of failure to thrive, hypoalbuminemia.  Notably last echocardiogram 09/2019 reviewed with mildly dilated left atrial, diastolic dysfunction, mild mitral valve regurgitation, trivial aortic regurgitation.  PMH: Lung cancer, atrial fibrillation on anticoagulation, diabetes, hyperlipidemia Surgical  history: Reviewed with patient, denies any surgeries And history: Father with emphysema, CAD, mother with cancer Social history: Never smoker, lives in [place], moved here around 2012   02/16/21 - Admission for Thoracentesis Patient here to discuss Pleurx catheter.  He has had multiple thoracentesis over the last several months.  Initially got 2.45 L off on review.  Subsequently having decreasing amounts of fluid removed each time, most recently about 1.5 L.  Having to stop because of chest pain.  High suspicion for lung entrapment or trapped lung.  Discussed with patient.  His need for thoracentesis is prompted by increasing dyspnea.  After thoracentesis, his dyspnea improved.  He had cytology sent 08/2020 without evidence of malignant cells.  No further cytology sent.  Discussed at length that most likely the pleural effusion is malignant in nature.  Notably, he has a left-sided effusion.  Was not present on CT scan 07/2020 present on CT scan suspect 2022.  Present on subsequent CT scans and chest x-rays on review of serial images.  Most recent CT chest personally viewed interpreted as large mass on the right, satellite pulmonary metastases seen in the left lung, bilateral right greater than left effusions.  Effusions are loculated.   Etiology of the left pleural effusion was less clear.  Possibly malignant given metastases.  Possible third days seen in the setting of failure to thrive, hypoalbuminemia.  Notably last echocardiogram 09/2019 reviewed with mildly dilated left atrial, diastolic dysfunction, mild mitral valve regurgitation, trivial aortic regurgitation.   PMH: Lung cancer, atrial fibrillation on anticoagulation, diabetes, hyperlipidemia Surgical history: Reviewed with patient, denies any surgeries And history: Father with emphysema, CAD, mother with cancer Social history:  Never smoker, lives in [place], moved here around 2012  02/21/2021- Interim hx  Patient presents today for pleurx  follow-up/suture removal. Hx metastatic lung cancer on chemotherapy. Loculated right pleural effusion. Multiple thoracentesis. Cytology negative x1. Oncology recommended pleurx catheter placement, he underwent procedure on 02/16/21 with Dr. Tamala Julian. His wife was taught how to drain pleurx catheter but they would like their son to learn as well.  Family is interested in getting home health set up to help with pleurx cath drainage, they are willing to pay out of pocket  He is doing alright. States that he can breath better since having right sided pleurx catheter was placed. Cough is now much drier, he can take hycodan if needed. His wife and son have been helping drain catheter. They are draining every day approx 500-819ml. Wife no longer feels they need home health services. Denies f/c/s, chest pain or hemoptysis.   Right pleurx cath drainage 11/18 4pm- 841ml  11/19 4:30- 578ml 11/20- 4:30- 527ml 11/21 6:30- 647ml  Allergies  Allergen Reactions   Bee Venom Swelling    Lip swelling    Immunization History  Administered Date(s) Administered   Fluad Quad(high Dose 65+) 01/06/2020   Influenza,inj,Quad PF,6+ Mos 12/22/2014, 01/10/2016   Influenza-Unspecified 12/02/2018   PFIZER Comirnaty(Gray Top)Covid-19 Tri-Sucrose Vaccine 08/10/2020   PFIZER(Purple Top)SARS-COV-2 Vaccination 04/29/2019, 05/20/2019, 12/09/2019   Pneumococcal Conjugate-13 07/12/2016   Pneumococcal Polysaccharide-23 07/15/2017   Tdap 10/16/2013, 12/22/2014   Zoster, Live 01/21/2015    Past Medical History:  Diagnosis Date   Chicken pox    DM2 (diabetes mellitus, type 2) ([place])    History of kidney stones    passed several    Hypertension    Kidney stones    5-6 times   lung ca 10/2019   Measles    Mumps    Stroke ([place])    x 2, last one was 09/18/2019- has some expressive aphasia    Tobacco History: Social History   Tobacco Use  Smoking Status Never  Smokeless Tobacco Never   Counseling given: Not  Answered   Outpatient Medications Prior to Visit  Medication Sig Dispense Refill   albuterol (VENTOLIN HFA) 108 (90 Base) MCG/ACT inhaler Inhale 2 puffs into the lungs every 6 (six) hours as needed for wheezing or shortness of breath. 8 g 2   apixaban (ELIQUIS) 5 MG TABS tablet Take 1 tablet (5 mg total) by mouth 2 (two) times daily. 60 tablet 11   atorvastatin (LIPITOR) 40 MG tablet TAKE ONE TABLET BY MOUTH ONE TIME DAILY 6PM 90 tablet 3   cephALEXin (KEFLEX) 500 MG capsule Take 1 capsule (500 mg total) by mouth 3 (three) times daily. 21 capsule 0   diltiazem (CARDIZEM CD) 180 MG 24 hr capsule Take 1 capsule (180 mg total) by mouth daily. (Patient taking differently: Take 180 mg by mouth every evening.) 30 capsule 6   diphenhydrAMINE HCl, Sleep, (ZZZQUIL) 25 MG CAPS Take 25 mg by mouth at bedtime.     HYDROcodone bit-homatropine (HYCODAN) 5-1.5 MG/5ML syrup Take 5 mLs by mouth every 6 (six) hours as needed for cough. 473 mL 0   lidocaine-prilocaine (EMLA) cream Apply to the Port-A-Cath site 30-60-minute before treatment 30 g 0   MELATONIN PO Take 5 mg by mouth at bedtime as needed (sleep).     metFORMIN (GLUCOPHAGE) 500 MG tablet TAKE TWO TABLETS BY MOUTH TWICE A DAY WITH MEAL(S) (Patient taking differently: TAKE 1 TABLETS BY MOUTH TWICE A DAY WITH MEAL(S)) 400 tablet  3   Omega-3 Fatty Acids (FISH OIL) 1200 MG CAPS Take 1,200 mg by mouth in the morning.     prochlorperazine (COMPAZINE) 10 MG tablet Take 1 tablet (10 mg total) by mouth every 6 (six) hours as needed. 30 tablet 2   No facility-administered medications prior to visit.    Review of Systems  Review of Systems  Constitutional: Negative.   Respiratory:  Positive for cough. Negative for chest tightness, shortness of breath and wheezing.   Cardiovascular: Negative.     Physical Exam  BP 100/60 (BP Location: Right Arm, Patient Position: Sitting, Cuff Size: Normal)   Pulse 76   Temp (!) 97.5 F (36.4 C) (Oral)   Ht 6\' 2"   (1.88 m)   Wt 156 lb 9.6 oz (71 kg)   SpO2 94%   BMI 20.11 kg/m  Physical Exam Constitutional:      Appearance: Normal appearance.  HENT:     Head: Normocephalic and atraumatic.     Mouth/Throat:     Comments: Deferred d/t masking Cardiovascular:     Rate and Rhythm: Normal rate and regular rhythm.  Pulmonary:     Effort: Pulmonary effort is normal.     Breath sounds: Normal breath sounds. No wheezing, rhonchi or rales.     Comments: Pleurx drain right chest wall  Musculoskeletal:        General: Normal range of motion.     Comments: Scattered moles   Skin:    General: Skin is warm and dry.  Neurological:     General: No focal deficit present.     Mental Status: He is alert and oriented to person, place, and time. Mental status is at baseline.  Psychiatric:        Mood and Affect: Mood normal.        Behavior: Behavior normal.        Thought Content: Thought content normal.        Judgment: Judgment normal.     Lab Results:  CBC    Component Value Date/Time   WBC 2.8 (L) 02/15/2021 1412   WBC 10.6 (H) 09/24/2019 1203   RBC 2.67 (L) 02/15/2021 1412   HGB 9.6 (L) 02/15/2021 1412   HCT 28.9 (L) 02/15/2021 1412   PLT 184 02/15/2021 1412   MCV 108.2 (H) 02/15/2021 1412   MCH 36.0 (H) 02/15/2021 1412   MCHC 33.2 02/15/2021 1412   RDW 17.8 (H) 02/15/2021 1412   LYMPHSABS 0.3 (L) 02/15/2021 1412   MONOABS 0.7 02/15/2021 1412   EOSABS 0.0 02/15/2021 1412   BASOSABS 0.1 02/15/2021 1412    BMET    Component Value Date/Time   NA 139 02/15/2021 1412   K 4.0 02/15/2021 1412   CL 103 02/15/2021 1412   CO2 27 02/15/2021 1412   GLUCOSE 213 (H) 02/15/2021 1412   BUN 30 (H) 02/15/2021 1412   CREATININE 1.06 02/15/2021 1412   CREATININE 1.13 10/16/2013 1118   CALCIUM 8.9 02/15/2021 1412   GFRNONAA >60 02/15/2021 1412   GFRAA >60 12/30/2019 0844    BNP No results found for: BNP  ProBNP No results found for: PROBNP  Imaging: DG Chest 1 View  Result Date:  02/03/2021 CLINICAL DATA:  s/p right thoracentesis EXAM: CHEST  1 VIEW COMPARISON:  January 20, 2021 FINDINGS: The cardiomediastinal silhouette is unchanged in contour.RIGHT chest port with tip terminating over the RIGHT atrium. Cardiac loop recorder. Moderate RIGHT pleural effusion. Small LEFT pleural effusion. No significant pneumothorax. Revisualization of a  RIGHT paramediastinal mass and multiple bilateral pulmonary nodules consistent with known malignancy. Bibasilar homogeneous opacification most consistent with atelectasis. Visualized abdomen is unremarkable. IMPRESSION: Moderate RIGHT and small LEFT pleural effusion. Electronically Signed   By: Valentino Saxon M.D.   On: 02/03/2021 13:45   CT Chest W Contrast  Result Date: 01/25/2021 CLINICAL DATA:  Primary Cancer Type: Lung Imaging Indication: Assess response to therapy Interval therapy since last imaging? Yes Initial Cancer Diagnosis Date: 09/30/2019; Established by: Biopsy-proven Detailed Pathology: Stage IV non-small cell lung cancer, adenocarcinoma. Primary Tumor location: Right upper lobe. Surgeries: No. Chemotherapy: Yes; Ongoing? Yes; Most recent administration: 01/18/2021 Immunotherapy?  Yes; Type: Keytruda; Ongoing? No Radiation therapy? Yes; Date Range: 11/03/2019 - 11/23/2019; Target: Right upper lung EXAM: CT CHEST ABDOMEN AND PELVIS WITH CONTRAST TECHNIQUE: Multidetector CT imaging of the chest was performed during intravenous contrast administration. CONTRAST:  62mL OMNIPAQUE IOHEXOL 350 MG/ML SOLN, additional oral enteric contrast COMPARISON:  Most recent CT chest, abdomen and pelvis 11/14/2020. FINDINGS: CT CHEST FINDINGS Cardiovascular: Right chest port catheter. Normal heart size. No pericardial effusion. Mediastinum/Nodes: No enlarged mediastinal, hilar, or axillary lymph nodes. Thyroid gland, trachea, and esophagus demonstrate no significant findings. Lungs/Pleura: No significant change in post treatment appearance of a mass of  the paramedian right upper lobe, measuring 6.3 x 3.3 cm (series 2, image 24). Numerous bilateral pulmonary nodules, some of which are cavitary. Some pulmonary nodules are minimally enlarged, for example a nodule of the medial left upper lobe measuring 1.1 x 1.0 cm previously 0.9 x 0.8 cm (series 4, image 62), and a nodule of the superior segment left lower lobe measuring 1.3 x 1.0 cm previously 1.0 x 0.9 cm (series 4, image 65). Many subcentimeter nodules are not demonstrably changed. Large right pleural effusion, slightly diminished in volume compared to prior examination. Moderate left pleural effusion, slightly increased in volume. Musculoskeletal: No chest wall mass or suspicious bone lesions identified. CT ABDOMEN PELVIS FINDINGS Hepatobiliary: No solid liver abnormality is seen. No gallstones, gallbladder wall thickening, or biliary dilatation. Pancreas: Unremarkable. No pancreatic ductal dilatation or surrounding inflammatory changes. Spleen: Normal in size without significant abnormality. Adrenals/Urinary Tract: Adrenal glands are unremarkable. Kidneys are normal, without renal calculi, solid lesion, or hydronephrosis. Large calculus in the dependent bladder (series 2, image 117). Stomach/Bowel: Stomach is within normal limits. Appendix appears normal. No evidence of bowel wall thickening, distention, or inflammatory changes. Sigmoid diverticulosis. Large burden of stool throughout colon and rectum. Vascular/Lymphatic: No significant vascular findings are present. No enlarged abdominal or pelvic lymph nodes. Reproductive: No mass or other abnormality. Other: No abdominal wall hernia or abnormality. No abdominopelvic ascites. Musculoskeletal: No acute or significant osseous findings. IMPRESSION: 1. No significant change in post treatment appearance of a mass of the paramedian right upper lobe. 2. Numerous bilateral pulmonary nodules, some of which are cavitary. Some pulmonary nodules are minimally enlarged;  many subcentimeter nodules are not demonstrably changed. Findings are consistent with stable to slightly worsened pulmonary metastatic disease. 3. Large right pleural effusion, slightly diminished in volume compared to prior examination. Moderate left pleural effusion, slightly increased in volume compared to prior examination. No direct evidence of pleural metastatic disease. 4. No evidence of distant metastatic disease in the abdomen or pelvis. 5. Large calculus in the dependent bladder. Electronically Signed   By: Delanna Ahmadi M.D.   On: 01/25/2021 11:33   CT Abdomen W Contrast  Result Date: 01/25/2021 CLINICAL DATA:  Primary Cancer Type: Lung Imaging Indication: Assess response to therapy Interval therapy  since last imaging? Yes Initial Cancer Diagnosis Date: 09/30/2019; Established by: Biopsy-proven Detailed Pathology: Stage IV non-small cell lung cancer, adenocarcinoma. Primary Tumor location: Right upper lobe. Surgeries: No. Chemotherapy: Yes; Ongoing? Yes; Most recent administration: 01/18/2021 Immunotherapy?  Yes; Type: Keytruda; Ongoing? No Radiation therapy? Yes; Date Range: 11/03/2019 - 11/23/2019; Target: Right upper lung EXAM: CT CHEST ABDOMEN AND PELVIS WITH CONTRAST TECHNIQUE: Multidetector CT imaging of the chest was performed during intravenous contrast administration. CONTRAST:  20mL OMNIPAQUE IOHEXOL 350 MG/ML SOLN, additional oral enteric contrast COMPARISON:  Most recent CT chest, abdomen and pelvis 11/14/2020. FINDINGS: CT CHEST FINDINGS Cardiovascular: Right chest port catheter. Normal heart size. No pericardial effusion. Mediastinum/Nodes: No enlarged mediastinal, hilar, or axillary lymph nodes. Thyroid gland, trachea, and esophagus demonstrate no significant findings. Lungs/Pleura: No significant change in post treatment appearance of a mass of the paramedian right upper lobe, measuring 6.3 x 3.3 cm (series 2, image 24). Numerous bilateral pulmonary nodules, some of which are  cavitary. Some pulmonary nodules are minimally enlarged, for example a nodule of the medial left upper lobe measuring 1.1 x 1.0 cm previously 0.9 x 0.8 cm (series 4, image 62), and a nodule of the superior segment left lower lobe measuring 1.3 x 1.0 cm previously 1.0 x 0.9 cm (series 4, image 65). Many subcentimeter nodules are not demonstrably changed. Large right pleural effusion, slightly diminished in volume compared to prior examination. Moderate left pleural effusion, slightly increased in volume. Musculoskeletal: No chest wall mass or suspicious bone lesions identified. CT ABDOMEN PELVIS FINDINGS Hepatobiliary: No solid liver abnormality is seen. No gallstones, gallbladder wall thickening, or biliary dilatation. Pancreas: Unremarkable. No pancreatic ductal dilatation or surrounding inflammatory changes. Spleen: Normal in size without significant abnormality. Adrenals/Urinary Tract: Adrenal glands are unremarkable. Kidneys are normal, without renal calculi, solid lesion, or hydronephrosis. Large calculus in the dependent bladder (series 2, image 117). Stomach/Bowel: Stomach is within normal limits. Appendix appears normal. No evidence of bowel wall thickening, distention, or inflammatory changes. Sigmoid diverticulosis. Large burden of stool throughout colon and rectum. Vascular/Lymphatic: No significant vascular findings are present. No enlarged abdominal or pelvic lymph nodes. Reproductive: No mass or other abnormality. Other: No abdominal wall hernia or abnormality. No abdominopelvic ascites. Musculoskeletal: No acute or significant osseous findings. IMPRESSION: 1. No significant change in post treatment appearance of a mass of the paramedian right upper lobe. 2. Numerous bilateral pulmonary nodules, some of which are cavitary. Some pulmonary nodules are minimally enlarged; many subcentimeter nodules are not demonstrably changed. Findings are consistent with stable to slightly worsened pulmonary metastatic  disease. 3. Large right pleural effusion, slightly diminished in volume compared to prior examination. Moderate left pleural effusion, slightly increased in volume compared to prior examination. No direct evidence of pleural metastatic disease. 4. No evidence of distant metastatic disease in the abdomen or pelvis. 5. Large calculus in the dependent bladder. Electronically Signed   By: Delanna Ahmadi M.D.   On: 01/25/2021 11:33   DG CHEST PORT 1 VIEW  Result Date: 02/16/2021 CLINICAL DATA:  Chest tube placement EXAM: PORTABLE CHEST 1 VIEW COMPARISON:  02/03/2021 FINDINGS: Power jet double Port-A-Cath tip: Right atrium. Loop recorder noted. A right-sided chest tube is been placed with tip near the hemithoracic apex. Moderate but reduced right pleural effusion, with improved aeration at the right lung base. Small right apical pneumothorax. Small to moderate left pleural effusion. Right upper lobe atelectasis. Scattered nodules are noted in the lungs, more perceptible in the left lung. IMPRESSION: 1. Interval placement of  a right-sided chest tube with reduced size of the right pleural effusion and improved aeration of the right lung base. There is a 5-10% right apical pneumothorax (chest tube already in place). 2. Continued atelectasis especially in the right upper lobe and right perihilar region. 3. Stable small to moderate left pleural effusion. 4. Stable scattered bilateral pulmonary nodules more perceptible in the left lung. Electronically Signed   By: Van Clines M.D.   On: 02/16/2021 15:46   IR THORACENTESIS ASP PLEURAL SPACE W/IMG GUIDE  Result Date: 02/03/2021 INDICATION: Patient history of lung cancer with recurrent right-sided pleural effusion. Presents for therapeutic thoracentesis. EXAM: ULTRASOUND GUIDED THERAPEUTIC THORACENTESIS MEDICATIONS: Lidocaine 1% 10 mL COMPLICATIONS: None immediate. PROCEDURE: An ultrasound guided thoracentesis was thoroughly discussed with the patient and questions  answered. The benefits, risks, alternatives and complications were also discussed. The patient understands and wishes to proceed with the procedure. Written consent was obtained. Ultrasound was performed to localize and mark an adequate pocket of fluid in the right chest. The area was then prepped and draped in the normal sterile fashion. 1% Lidocaine was used for local anesthesia. Under ultrasound guidance a 19 gauge, 7-cm, Yueh catheter was introduced. Thoracentesis was performed. The catheter was removed and a dressing applied. FINDINGS: A total of approximately 1.45 L of straw-colored fluid was removed. IMPRESSION: Successful ultrasound guided right-sided therapeutic thoracentesis. Read by: Rushie Nyhan, NP Electronically Signed   By: Corrie Mckusick D.O.   On: 02/03/2021 16:09     Assessment & Plan:   Pleural effusion -Hx lung cancer. Right sided pleurx catheter placed on 02/16/21 by Dr. Carmon Sails. Family is currently draining catheter every day (average 500-831ml per drainage). We drained pleurx today in office with 650cc output golden yellow fluid. He had slight chest discomfort with drainage towards the end of the procedure so we stopped. Pleurx capped. Rates right sided pleuritc pain 4/10. His discomfort usually resolves after 1 hour. We removed two sutures today. Catheter insertion site without evidence of infection. We placed clean, dry, sterile dressing after suture removal. He will resume regular drainage schedule tomorrow. Advised once output is <150cc x 3 he can go to every other day. Follow-up in 1-2 months with Dr. Silas Flood or sooner if needed.   40 mins spent on case: >50% time face to face with patient  Martyn Ehrich, NP 02/21/2021

## 2021-02-21 ENCOUNTER — Encounter: Payer: Self-pay | Admitting: Primary Care

## 2021-02-21 ENCOUNTER — Ambulatory Visit (INDEPENDENT_AMBULATORY_CARE_PROVIDER_SITE_OTHER): Payer: Medicare HMO | Admitting: Primary Care

## 2021-02-21 DIAGNOSIS — J90 Pleural effusion, not elsewhere classified: Secondary | ICD-10-CM

## 2021-02-21 NOTE — Patient Instructions (Addendum)
Drain pleurx every day, do not exceed 1,062ml; If <150cc x 3 consecutive drainage's you can decrease frequently to every other day   Bathing  ? DO NOT tub bath  ? You may shower after the dressing and catheter are placed  underneath an adhesive water proof dressing.  ? Micheal Oliver may be used to cover the area and secured with  waterproof tape. ? If possible, plan to shower shortly before your scheduled dressing  change in case the dressing gets wet. ? The dressing MUST BE changed if it gets wet  Follow-up: 1-2 months with Dr. Silas Flood or sooner if needed       Indwelling Pleural Catheter Home Guide An indwelling pleural catheter is a thin, flexible tube that is inserted under your skin and into your chest. The catheter drains excess fluid that collects in the area between the chest wall and the lungs (pleural space).After the catheter is inserted, it can be attached to a container that collects fluid. The pleural catheter will allow you to drain fluid from your chest at home on a regular basis (sometimes daily). This will eliminate the need for frequent visits to the hospital or clinic to drain the fluid. The catheter may be removed after the excess fluid problem is resolved, usually after 2-3 months. It is important to follow instructions from your health care provider about how to drain and care for your catheter. What are the risks? Generally, this is a safe procedure. However, problems may occur, including: Infection. Skin damage around the catheter. Lung damage. Failure of the pleural catheter to work properly. Spreading of cancer cells along the catheter, if you have cancer. Supplies needed: Vacuum-sealed drainage container with attached drainage line. Germ-free (sterile) dressing. Sterile alcohol pads. Sterile gloves. Valve cap. Sterile gauze pads, 4 inches  4 inches (10 cm  10 cm). Tape. Adhesive dressing. Sterile foam catheter pad. How to care for your catheter  and insertion site Wash your hands with soap and warm water for at least 20 seconds before and after touching the catheter or insertion site. If soap and water are not available, use hand sanitizer. Change your dressing regularly, usually every week, or as often as told by your health care provider. Keep the skin around the catheter clean and dry. Check the catheter regularly for any cracks or kinks in the tubing. Check your catheter insertion site every day for signs of infection. Check for: Redness, swelling, or pain. Fluid or blood. Warmth. Pus or a bad smell. How to drain your catheter Follow instructions from your health care provider about how often to drain your catheter. You may also refer to instructions that come with the pleural catheter drainage system. To drain the catheter: Wash your hands with soap and warm water for at least 20 seconds. If soap and water are not available, use hand sanitizer. Carefully remove the dressing from around the catheter. Do not discard unless performing a dressing change. Wash your hands again. Put on sterile gloves. Prepare the vacuum-sealed drainage container and drainage line. Close the drainage line of the vacuum-sealed drainage container by squeezing the pinch clamp or rolling the wheel of the roller clamp toward the container. The vacuum in the container will be lost if the line is not closed completely. Remove the access tip cover from the drainage line. Do not touch the end. Set it on a sterile surface. Remove the catheter valve cap and throw it away. Use an alcohol pad to clean the end  of the catheter. Insert the access tip into the catheter valve. Make sure the valve and access tip are securely connected. Listen for a click to confirm that they are connected. Insert the T plunger to break the vacuum seal on the drainage container. Open the clamp on the drainage line. Allow the catheter to drain. Keep the catheter and the drainage container  below the level of your chest. There may be a one-way valve on the end of the tubing that will allow liquid and air to flow out of the catheter without letting air inside. Drain the amount of fluid as told by your health care provider. It usually takes 5-15 minutes. Do not drain more than 1000 mL of fluid. You may feel a little discomfort while you are draining. If the pain is severe, stop draining and contact your health care provider. After you finish draining the catheter, remove the drainage container tubing from the catheter. Use a clean alcohol pad to wipe the catheter tip. Place a clean cap on the end of the catheter. Use an alcohol pad to clean the skin around the catheter. Allow the skin to air-dry. Put the catheter pad on your skin. Curl the catheter into loops and place it on the pad. Do not place the catheter on your skin. Replace the dressing over the catheter. Discard the drainage container as instructed by your health care provider. Do not reuse the drainage container. Wash your hands with soap and warm water for at least 20 seconds. If soap and water are not available, use hand sanitizer. How to change your dressing Change your dressing at least once a week, or more often if needed to keep the dressing dry. Be sure to change the dressing whenever it becomes moist. Your health care provider will tell you how often to change your dressing. Wash your hands with soap and warm water for at least 20 seconds. If soap and water are not available, use hand sanitizer. Gently remove the old dressing. Avoid using scissors to remove the dressing. Sharp objects may damage the catheter. Wash the skin around the insertion site with mild, fragrance-free soap and warm water. Rinse well, then pat the area dry with a clean cloth. If your catheter was stitched (sutured) to your skin, look at the suture to make sure it is still anchored in your skin. Do not apply creams, ointments, or alcohol to the area.  Let your skin air-dry completely before you apply a new dressing. Curl the catheter into loops and place it on the sterile catheter pad. Do not place the catheter on your skin. If you do not have a pad, use a clean dressing. Slide the dressing under the disk that holds the drainage catheter in place. Use clean gauze to cover the catheter and the catheter pad. The catheter should rest on the pad or dressing, not on your skin. Tape the dressing to your skin. You may be instructed to use an adhesive dressing covering instead of gauze and tape. Wash your hands with soap and warm water for at least 20 seconds. If soap and water are not available, use hand sanitizer. General tips and recommendations Always wash your hands with soap and warm water for at least 20 seconds before and after caring for your catheter and drainage container. Use a mild, fragrance-free soap. If soap and water are not available, use hand sanitizer. Always make sure there are no leaks or loose connections in the catheter or drainage container. Each time  you drain the catheter, note the color and amount of fluid. Do not take baths, swim, or use a hot tub until your health care provider approves. Ask your health care provider if you may take showers. You may only be allowed to take sponge baths. Take deep breaths regularly, followed by a cough. Doing this can help to prevent lung infection. Contact a health care provider if: You still have pain at the catheter insertion site more than 2 days after your procedure. You have pain while draining your catheter. You have redness, swelling, or pain around your catheter insertion site. You have fluid or blood coming from your catheter insertion site. Your catheter insertion site feels warm to the touch. You have pus or a bad smell coming from your catheter insertion site. You have a fever. Your catheter becomes bent, twisted, or cracked. Get help right away if: You have chest pain. You  have dizziness or shortness of breath. The catheter comes out. The catheter is blocked or clogged. These symptoms may be an emergency. Get help right away. Call 911. Do not wait to see if the symptoms will go away. Do not drive yourself to the hospital. Summary An indwelling pleural catheter is a thin, flexible tube that is inserted under your skin and into your chest to drain excess fluid that collects in the pleural space. It is important to follow instructions from your health care provider about how to drain and care for your catheter. Do not touch the tip of the catheter or the drainage container tubing. Always wash your hands with soap and water before and after caring for your catheter and drainage container. If soap and water are not available, use hand sanitizer. This information is not intended to replace advice given to you by your health care provider. Make sure you discuss any questions you have with your health care provider. Document Revised: 10/11/2020 Document Reviewed: 10/11/2020 Else[employer] Patient Education  [place].

## 2021-02-21 NOTE — Assessment & Plan Note (Addendum)
-  Hx lung cancer. Right sided pleurx catheter placed on 02/16/21 by Dr. Carmon Sails. Family is currently draining catheter every day (average 500-872ml per drainage). We drained pleurx today in office with 650cc output golden yellow fluid. He had slight chest discomfort with drainage towards the end of the procedure so we stopped. Pleurx capped. Rates right sided pleuritc pain 4/10. His discomfort usually resolves after 1 hour. We removed two sutures today. Catheter insertion site without evidence of infection. We placed clean, dry, sterile dressing after suture removal. He will resume regular drainage schedule tomorrow. Advised once output is <150cc x 3 he can go to every other day. Follow-up in 1-2 months with Dr. Silas Flood or sooner if needed.

## 2021-02-22 ENCOUNTER — Other Ambulatory Visit: Payer: Medicare HMO

## 2021-02-22 ENCOUNTER — Telehealth: Payer: Self-pay | Admitting: Primary Care

## 2021-02-22 ENCOUNTER — Ambulatory Visit: Payer: Medicare HMO

## 2021-02-22 NOTE — Telephone Encounter (Signed)
Not all that uncommon. Should not have been related to suture removal. Keep an eye on it. Would not drain again until Friday unless he is short of breath.

## 2021-02-22 NOTE — Telephone Encounter (Signed)
Call made to patient, confirmed DOB. Made aware of EW recommendations. Voiced understanding.   Nothing further needed at this time.

## 2021-02-22 NOTE — Telephone Encounter (Signed)
Call returned to patient, confirmed DOB. Patient had an appt yesterday and during the appt he had some sutures removed from his chest tube site and he was also drained. Today they only got 15ml from the drainage and it has blood in it. Denies chest pain and increased SOB. States they are concerned about the drainage being bloody. Denies having any other symptoms.   Ew please advise. Thanks :)

## 2021-02-27 ENCOUNTER — Ambulatory Visit (INDEPENDENT_AMBULATORY_CARE_PROVIDER_SITE_OTHER): Payer: Medicare HMO

## 2021-02-27 DIAGNOSIS — I639 Cerebral infarction, unspecified: Secondary | ICD-10-CM | POA: Diagnosis not present

## 2021-02-27 LAB — CUP PACEART REMOTE DEVICE CHECK
Date Time Interrogation Session: 20221121230930
Implantable Pulse Generator Implant Date: 20210907

## 2021-02-28 ENCOUNTER — Other Ambulatory Visit: Payer: Self-pay

## 2021-02-28 ENCOUNTER — Encounter (HOSPITAL_BASED_OUTPATIENT_CLINIC_OR_DEPARTMENT_OTHER): Payer: Medicare HMO | Admitting: Internal Medicine

## 2021-02-28 DIAGNOSIS — R634 Abnormal weight loss: Secondary | ICD-10-CM | POA: Diagnosis not present

## 2021-02-28 DIAGNOSIS — Z923 Personal history of irradiation: Secondary | ICD-10-CM | POA: Diagnosis not present

## 2021-02-28 DIAGNOSIS — Z8673 Personal history of transient ischemic attack (TIA), and cerebral infarction without residual deficits: Secondary | ICD-10-CM | POA: Diagnosis not present

## 2021-02-28 DIAGNOSIS — I10 Essential (primary) hypertension: Secondary | ICD-10-CM | POA: Diagnosis not present

## 2021-02-28 DIAGNOSIS — L89312 Pressure ulcer of right buttock, stage 2: Secondary | ICD-10-CM | POA: Diagnosis not present

## 2021-02-28 DIAGNOSIS — E119 Type 2 diabetes mellitus without complications: Secondary | ICD-10-CM | POA: Diagnosis not present

## 2021-02-28 DIAGNOSIS — Z79899 Other long term (current) drug therapy: Secondary | ICD-10-CM | POA: Diagnosis not present

## 2021-02-28 DIAGNOSIS — C3411 Malignant neoplasm of upper lobe, right bronchus or lung: Secondary | ICD-10-CM | POA: Diagnosis not present

## 2021-02-28 NOTE — Progress Notes (Signed)
Micheal Oliver, Micheal Oliver ([id number]) Visit Report for 02/28/2021 Arrival Information Details Patient Name: Date of Service: Micheal Oliver, Micheal Oliver 02/28/2021 2:00 PM Medical Record Number: [id number] Patient Account Number: [id number] Date of Birth/Sex: Treating RN: 1950/06/05 (70 y.o. Janyth Contes Primary Care Lilas Diefendorf: Lamar Blinks Other Clinician: Referring Samar Dass: Treating Arfa Lamarca/Extender: Delton See in Treatment: 6 Visit Information History Since Last Visit Added or deleted any medications: No Patient Arrived: Wheel Chair Any new allergies or adverse reactions: No Arrival Time: 14:46 Had a fall or experienced change in No Accompanied By: alone activities of daily living that may affect Transfer Assistance: None risk of falls: Patient Identification Verified: Yes Signs or symptoms of abuse/neglect since last visito No Secondary Verification Process Completed: Yes Hospitalized since last visit: No Patient Requires Transmission-Based Precautions: No Implantable device outside of the clinic excluding No Patient Has Alerts: No cellular tissue based products placed in the center since last visit: Has Dressing in Place as Prescribed: Yes Pain Present Now: No Electronic Signature(s) Signed: 02/28/2021 5:45:41 PM By: Levan Hurst RN, BSN Entered By: Levan Hurst on 02/28/2021 14:47:54 -------------------------------------------------------------------------------- Clinic Level of Care Assessment Details Patient Name: Date of Service: Micheal Oliver, Micheal Oliver 02/28/2021 2:00 PM Medical Record Number: [id number] Patient Account Number: [id number] Date of Birth/Sex: Treating RN: [birth year] (70 y.o. Janyth Contes Primary Care Micaylah Bertucci: Lamar Blinks Other Clinician: Referring Luciann Gossett: Treating Jaelon Gatley/Extender: Delton See in Treatment: 6 Clinic Level of Care Assessment Items TOOL 4 Quantity Score X- 1 0 Use when  only an EandM is performed on FOLLOW-UP visit ASSESSMENTS - Nursing Assessment / Reassessment X- 1 10 Reassessment of Co-morbidities (includes updates in patient status) X- 1 5 Reassessment of Adherence to Treatment Plan ASSESSMENTS - Wound and Skin A ssessment / Reassessment X - Simple Wound Assessment / Reassessment - one wound 1 5 _0  - 0 Complex Wound Assessment / Reassessment - multiple wounds _1  - 0 Dermatologic / Skin Assessment (not related to wound area) ASSESSMENTS - Focused Assessment _2  - 0 Circumferential Edema Measurements - multi extremities _3  - 0 Nutritional Assessment / Counseling / Intervention _4  - 0 Lower Extremity Assessment (monofilament, tuning fork, pulses) _5  - 0 Peripheral Arterial Disease Assessment (using hand held doppler) ASSESSMENTS - Ostomy and/or Continence Assessment and Care _6  - 0 Incontinence Assessment and Management _7  - 0 Ostomy Care Assessment and Management (repouching, etc.) PROCESS - Coordination of Care X - Simple Patient / Family Education for ongoing care 1 15 _8  - 0 Complex (extensive) Patient / Family Education for ongoing care X- 1 10 Staff obtains Programmer, systems, Records, T Results / Process Orders est _9  - 0 Staff telephones HHA, Nursing Homes / Clarify orders / etc _10  - 0 Routine Transfer to another Facility (non-emergent condition) _11  - 0 Routine Hospital Admission (non-emergent condition) _12  - 0 New Admissions / Biomedical engineer / Ordering NPWT Apligraf, etc. , _13  - 0 Emergency Hospital Admission (emergent condition) X- 1 10 Simple Discharge Coordination _14  - 0 Complex (extensive) Discharge Coordination PROCESS - Special Needs _15  - 0 Pediatric / Minor Patient Management _16  - 0 Isolation Patient Management _17  - 0 Hearing / Language / Visual special needs _18  - 0 Assessment of Community assistance (transportation, D/C planning, etc.) _19  - 0 Additional assistance / Altered mentation _20  - 0 Support  Surface(s) Assessment (bed, cushion, seat, etc.) INTERVENTIONS - Wound Cleansing / Measurement X - Simple Wound Cleansing - one wound 1 5 _21  - 0 Complex Wound Cleansing - multiple wounds X- 1  5 Wound Imaging (photographs - any number of wounds) _0  - 0 Wound Tracing (instead of photographs) X- 1 5 Simple Wound Measurement - one wound _1  - 0 Complex Wound Measurement - multiple wounds INTERVENTIONS - Wound Dressings X - Small Wound Dressing one or multiple wounds 1 10 _2  - 0 Medium Wound Dressing one or multiple wounds _3  - 0 Large Wound Dressing one or multiple wounds <GEXBMWUXLKGMWNUU>_7<\/OZDGUYQIHKVQQVZD>_6  - 0 Application of Medications - topical <LOVFIEPPIRJJOACZ>_6<\/SAYTKZSWFUXNATFT>_7  - 0 Application of Medications - injection INTERVENTIONS - Miscellaneous _6  - 0 External ear exam _7  - 0 Specimen Collection (cultures, biopsies, blood, body fluids, etc.) _8  - 0 Specimen(s) / Culture(s) sent or taken to Lab for analysis _9  - 0 Patient Transfer (multiple staff / Civil Service fast streamer / Similar devices) _10  - 0 Simple Staple / Suture removal (25 or less) _11  - 0 Complex Staple / Suture removal (26 or more) _12  - 0 Hypo / Hyperglycemic Management (close monitor of Blood Glucose) _13  - 0 Ankle / Brachial Index (ABI) - do not check if billed separately X- 1 5 Vital Signs Has the patient been seen at the hospital within the last three years: Yes Total Score: 85 Level Of Care: New/Established - Level 3 Electronic Signature(s) Signed: 02/28/2021 5:45:41 PM By: Levan Hurst RN, BSN Entered By: Levan Hurst on 02/28/2021 17:29:07 -------------------------------------------------------------------------------- Encounter Discharge Information Details Patient Name: Date of Service: Micheal Oliver 02/28/2021 2:00 PM Medical Record Number: [id number] Patient Account Number: [id number] Date of Birth/Sex: Treating RN: September 21, 1950 (70 y.o. Janyth Contes Primary Care Manilla Strieter: Lamar Blinks Other Clinician: Referring Nariah Morgano: Treating Jasiah Elsen/Extender:  Delton See in Treatment: 6 Encounter Discharge Information Items Discharge Condition: Stable Ambulatory Status: Wheelchair Discharge Destination: Home Transportation: Private Auto Accompanied By: wife Schedule Follow-up Appointment: Yes Clinical Summary of Care: Patient Declined Electronic Signature(s) Signed: 02/28/2021 5:45:41 PM By: Levan Hurst RN, BSN Entered By: Levan Hurst on 02/28/2021 17:29:58 -------------------------------------------------------------------------------- Multi Wound Chart Details Patient Name: Date of Service: Micheal Oliver 02/28/2021 2:00 PM Medical Record Number: [id number] Patient Account Number: [id number] Date of Birth/Sex: Treating RN: [birth year] (70 y.o. Oliver) Primary Care Haden Suder: Lamar Blinks Other Clinician: Referring Alayia Meggison: Treating Isaiyah Feldhaus/Extender: Delton See in Treatment: 6 Vital Signs Height(in): 74 Pulse(bpm): 70 Weight(lbs): 165 Blood Pressure(mmHg): 102/68 Body Mass Index(BMI): 21 Temperature(F): 97.7 Respiratory Rate(breaths/min): 16 Photos: [1:Right Gluteus] [N/A:N/A N/A] Wound Location: [1:Pressure Injury] [N/A:N/A] Wounding Event: [1:Pressure Ulcer] [N/A:N/A] Primary Etiology: [1:Arrhythmia, Hypertension, Type II] [N/A:N/A] Comorbid History: [1:Diabetes, Received Chemotherapy, Received Radiation, Anorexia/bulimia 12/15/2020] [N/A:N/A] Date Acquired: [1:6] [N/A:N/A] Weeks of Treatment: [1:Open] [N/A:N/A] Wound Status: [1:0.1x0.1x0.1] [N/A:N/A] Measurements L x W x D (cm) [1:0.008] [N/A:N/A] A (cm) : rea [1:0.001] [N/A:N/A] Volume (cm) : [1:98.70%] [N/A:N/A] % Reduction in A rea: [1:98.40%] [N/A:N/A] % Reduction in Volume: [1:Category/Stage II] [N/A:N/A] Classification: [1:Medium] [N/A:N/A] Exudate A mount: [1:Serosanguineous] [N/A:N/A] Exudate Type: [1:red, brown] [N/A:N/A] Exudate Color: [1:Distinct, outline attached] [N/A:N/A] Wound Margin:  [1:Large (67-100%)] [N/A:N/A] Granulation A mount: [1:Pink] [N/A:N/A] Granulation Quality: [1:None Present (0%)] [N/A:N/A] Necrotic A mount: [1:Fascia: No] [N/A:N/A] Exposed Structures: [1:Fat Layer (Subcutaneous Tissue): No Tendon: No Muscle: No Joint: No Bone: No Limited to Skin Breakdown Large (67-100%)] [N/A:N/A] Treatment Notes Electronic Signature(s) Signed: 02/28/2021 5:06:47 PM By: Linton Ham MD Entered By: Linton Ham on 02/28/2021 15:03:35 -------------------------------------------------------------------------------- [place] Details Patient Name: Date of Service: Micheal Oliver 02/28/2021 2:00 PM Medical Record Number: [id number] Patient Account Number: [id number] Date of Birth/Sex: Treating RN: [birth year] (70 y.o. Janyth Contes Primary  Care Kayelynn Abdou: Lamar Blinks Other Clinician: Referring Olanda Downie: Treating [place] Lusty/Extender: Delton See in Treatment: [place] reviewed with physician Active Inactive Wound/Skin Impairment Nursing Diagnoses: Impaired tissue integrity Knowledge deficit related to ulceration/compromised skin integrity Goals: Patient/caregiver will verbalize understanding of skin care regimen Date Initiated: 01/16/2021 Target Resolution Date: 03/31/2021 Goal Status: Active Ulcer/skin breakdown will have a volume reduction of 30% by week 4 Date Initiated: 01/16/2021 Date Inactivated: 02/28/2021 Target Resolution Date: 02/13/2021 Goal Status: Met Interventions: Assess patient/caregiver ability to obtain necessary supplies Assess patient/caregiver ability to perform ulcer/skin care regimen upon admission and as needed Assess ulceration(s) every visit Provide education on ulcer and skin care Treatment Activities: Skin care regimen initiated : 01/16/2021 Topical wound management initiated : 01/16/2021 Notes: Electronic Signature(s) Signed: 02/28/2021 5:45:41  PM By: Levan Hurst RN, BSN Entered By: Levan Hurst on 02/28/2021 15:00:57 -------------------------------------------------------------------------------- Pain Assessment Details Patient Name: Date of Service: Micheal Oliver 02/28/2021 2:00 PM Medical Record Number: [id number] Patient Account Number: [id number] Date of Birth/Sex: Treating RN: 1950-09-11 (70 y.o. Janyth Contes Primary Care Bora Bost: Lamar Blinks Other Clinician: Referring Olden Klauer: Treating Larisa Lanius/Extender: Delton See in Treatment: 6 Active Problems Location of Pain Severity and Description of Pain Patient Has Paino No Site Locations Pain Management and Medication Current Pain Management: Electronic Signature(s) Signed: 02/28/2021 5:45:41 PM By: Levan Hurst RN, BSN Entered By: Levan Hurst on 02/28/2021 14:48:15 -------------------------------------------------------------------------------- Patient/Caregiver Education Details Patient Name: Date of Service: Micheal Oliver 11/29/2022andnbsp2:00 PM Medical Record Number: [id number] Patient Account Number: [id number] Date of Birth/Gender: Treating RN: 07-30-1950 (70 y.o. Janyth Contes Primary Care Physician: Lamar Blinks Other Clinician: Referring Physician: Treating Physician/Extender: Delton See in Treatment: 6 Education Assessment Education Provided To: Patient Education Topics Provided Wound/Skin Impairment: Methods: Explain/Verbal Responses: State content correctly Electronic Signature(s) Signed: 02/28/2021 5:45:41 PM By: Levan Hurst RN, BSN Entered By: Levan Hurst on 02/28/2021 15:01:27 -------------------------------------------------------------------------------- Wound Assessment Details Patient Name: Date of Service: Micheal Oliver 02/28/2021 2:00 PM Medical Record Number: [id number] Patient Account Number: [id number] Date of  Birth/Sex: Treating RN: Sep 10, 1950 (70 y.o. Janyth Contes Primary Care Cathalina Barcia: Lamar Blinks Other Clinician: Referring Masae Lukacs: Treating Ellenore Roscoe/Extender: Delton See in Treatment: 6 Wound Status Wound Number: 1 Primary Pressure Ulcer Etiology: Wound Location: Right Gluteus Wound Open Wounding Event: Pressure Injury Status: Date Acquired: 12/15/2020 Comorbid Arrhythmia, Hypertension, Type II Diabetes, Received Weeks Of Treatment: 6 History: Chemotherapy, Received Radiation, Anorexia/bulimia Clustered Wound: No Photos Wound Measurements Length: (cm) 0.1 Width: (cm) 0.1 Depth: (cm) 0.1 Area: (cm) 0.008 Volume: (cm) 0.001 % Reduction in Area: 98.7% % Reduction in Volume: 98.4% Epithelialization: Large (67-100%) Tunneling: No Undermining: No Wound Description Classification: Category/Stage II Wound Margin: Distinct, outline attached Exudate Amount: Medium Exudate Type: Serosanguineous Exudate Color: red, brown Wound Bed Granulation Amount: Large (67-100%) Granulation Quality: Pink Necrotic Amount: None Present (0%) Foul Odor After Cleansing: No Slough/Fibrino No Exposed Structure Fascia Exposed: No Fat Layer (Subcutaneous Tissue) Exposed: No Tendon Exposed: No Muscle Exposed: No Joint Exposed: No Bone Exposed: No Limited to Skin Breakdown Treatment Notes Wound #1 (Gluteus) Wound Laterality: Right Cleanser Normal Saline Discharge Instruction: Cleanse the wound with Normal Saline prior to applying a clean dressing using gauze sponges, not tissue or cotton balls. Soap and Water Discharge Instruction: May shower and wash wound with dial antibacterial soap and water prior to dressing change. Peri-Wound Care Topical Primary Dressing PolyMem Non-Adhesive Dressing, 4x4 in Discharge Instruction: Apply to wound bed as  instructed Secondary Dressing Bordered Gauze, 4x4 in Discharge Instruction: Apply over primary dressing as  directed. Secured With Compression Wrap Compression Stockings Environmental education officer) Signed: 02/28/2021 5:11:56 PM By: Rhae Hammock RN Signed: 02/28/2021 5:45:41 PM By: Levan Hurst RN, BSN Entered By: Rhae Hammock on 02/28/2021 14:52:48 -------------------------------------------------------------------------------- Vitals Details Patient Name: Date of Service: Micheal Oliver 02/28/2021 2:00 PM Medical Record Number: [id number] Patient Account Number: [id number] Date of Birth/Sex: Treating RN: [birth year] (70 y.o. Janyth Contes Primary Care Janecia Palau: Lamar Blinks Other Clinician: Referring Tasnim Balentine: Treating Jeziel Hoffmann/Extender: Delton See in Treatment: 6 Vital Signs Time Taken: 14:46 Temperature (F): 97.7 Height (in): 74 Pulse (bpm): 70 Weight (lbs): 165 Respiratory Rate (breaths/min): 16 Body Mass Index (BMI): 21.2 Blood Pressure (mmHg): 102/68 Reference Range: 80 - 120 mg / dl Electronic Signature(s) Signed: 02/28/2021 5:45:41 PM By: Levan Hurst RN, BSN Entered By: Levan Hurst on 02/28/2021 14:48:10

## 2021-03-01 ENCOUNTER — Inpatient Hospital Stay: Payer: Medicare HMO

## 2021-03-01 ENCOUNTER — Encounter: Payer: Medicare HMO | Admitting: Dietician

## 2021-03-01 ENCOUNTER — Inpatient Hospital Stay (HOSPITAL_BASED_OUTPATIENT_CLINIC_OR_DEPARTMENT_OTHER): Payer: Medicare HMO | Admitting: Internal Medicine

## 2021-03-01 ENCOUNTER — Encounter: Payer: Self-pay | Admitting: Internal Medicine

## 2021-03-01 ENCOUNTER — Other Ambulatory Visit: Payer: Self-pay | Admitting: Medical Oncology

## 2021-03-01 VITALS — BP 107/79 | HR 74 | Temp 96.8°F | Resp 19 | Ht 74.0 in | Wt 154.7 lb

## 2021-03-01 DIAGNOSIS — S31819A Unspecified open wound of right buttock, initial encounter: Secondary | ICD-10-CM | POA: Diagnosis not present

## 2021-03-01 DIAGNOSIS — Z23 Encounter for immunization: Secondary | ICD-10-CM

## 2021-03-01 DIAGNOSIS — C3411 Malignant neoplasm of upper lobe, right bronchus or lung: Secondary | ICD-10-CM

## 2021-03-01 DIAGNOSIS — Z79899 Other long term (current) drug therapy: Secondary | ICD-10-CM | POA: Diagnosis not present

## 2021-03-01 DIAGNOSIS — C3490 Malignant neoplasm of unspecified part of unspecified bronchus or lung: Secondary | ICD-10-CM

## 2021-03-01 DIAGNOSIS — Z923 Personal history of irradiation: Secondary | ICD-10-CM | POA: Diagnosis not present

## 2021-03-01 DIAGNOSIS — Z5111 Encounter for antineoplastic chemotherapy: Secondary | ICD-10-CM | POA: Diagnosis not present

## 2021-03-01 DIAGNOSIS — Z95828 Presence of other vascular implants and grafts: Secondary | ICD-10-CM

## 2021-03-01 DIAGNOSIS — L89312 Pressure ulcer of right buttock, stage 2: Secondary | ICD-10-CM | POA: Diagnosis not present

## 2021-03-01 LAB — CMP (CANCER CENTER ONLY)
ALT: 70 U/L — ABNORMAL HIGH (ref 0–44)
AST: 35 U/L (ref 15–41)
Albumin: 2.8 g/dL — ABNORMAL LOW (ref 3.5–5.0)
Alkaline Phosphatase: 102 U/L (ref 38–126)
Anion gap: 6 (ref 5–15)
BUN: 34 mg/dL — ABNORMAL HIGH (ref 8–23)
CO2: 27 mmol/L (ref 22–32)
Calcium: 8.6 mg/dL — ABNORMAL LOW (ref 8.9–10.3)
Chloride: 106 mmol/L (ref 98–111)
Creatinine: 1.15 mg/dL (ref 0.61–1.24)
GFR, Estimated: >60 mL/min (ref >60)
Glucose, Bld: 110 mg/dL — ABNORMAL HIGH (ref 70–99)
Potassium: 3.9 mmol/L (ref 3.5–5.1)
Sodium: 139 mmol/L (ref 135–145)
Total Bilirubin: 0.7 mg/dL (ref 0.3–1.2)
Total Protein: 5.9 g/dL — ABNORMAL LOW (ref 6.5–8.1)

## 2021-03-01 LAB — CBC WITH DIFFERENTIAL (CANCER CENTER ONLY)
Abs Immature Granulocytes: 0.03 10*3/uL (ref 0.00–0.07)
Basophils Absolute: 0 10*3/uL (ref 0.0–0.1)
Basophils Relative: 0 %
Eosinophils Absolute: 0 10*3/uL (ref 0.0–0.5)
Eosinophils Relative: 1 %
HCT: 30 % — ABNORMAL LOW (ref 39.0–52.0)
Hemoglobin: 9.9 g/dL — ABNORMAL LOW (ref 13.0–17.0)
Immature Granulocytes: 0 %
Lymphocytes Relative: 5 %
Lymphs Abs: 0.4 10*3/uL — ABNORMAL LOW (ref 0.7–4.0)
MCH: 36.5 pg — ABNORMAL HIGH (ref 26.0–34.0)
MCHC: 33 g/dL (ref 30.0–36.0)
MCV: 110.7 fL — ABNORMAL HIGH (ref 80.0–100.0)
Monocytes Absolute: 0.6 10*3/uL (ref 0.1–1.0)
Monocytes Relative: 8 %
Neutro Abs: 6.3 10*3/uL (ref 1.7–7.7)
Neutrophils Relative %: 86 %
Platelet Count: 236 10*3/uL (ref 150–400)
RBC: 2.71 MIL/uL — ABNORMAL LOW (ref 4.22–5.81)
RDW: 20.1 % — ABNORMAL HIGH (ref 11.5–15.5)
WBC Count: 7.4 10*3/uL (ref 4.0–10.5)
nRBC: 0 % (ref 0.0–0.2)

## 2021-03-01 MED ORDER — INFLUENZA VAC A&B SA ADJ QUAD 0.5 ML IM PRSY
0.5000 mL | PREFILLED_SYRINGE | Freq: Once | INTRAMUSCULAR | Status: AC
  Administered 2021-03-01: 0.5 mL via INTRAMUSCULAR
  Filled 2021-03-01: qty 0.5

## 2021-03-01 MED ORDER — PROCHLORPERAZINE MALEATE 10 MG PO TABS: 10.0000 mg | ORAL_TABLET | Freq: Once | ORAL | Status: DC

## 2021-03-01 MED ORDER — HEPARIN SOD (PORK) LOCK FLUSH 100 UNIT/ML IV SOLN
500.0000 [IU] | Freq: Once | INTRAVENOUS | Status: AC | PRN
  Administered 2021-03-01: 500 [IU]

## 2021-03-01 MED ORDER — SODIUM CHLORIDE 0.9% FLUSH
10.0000 mL | INTRAVENOUS | Status: DC | PRN
  Administered 2021-03-01: 10 mL via INTRAVENOUS

## 2021-03-01 MED ORDER — SODIUM CHLORIDE 0.9% FLUSH
10.0000 mL | INTRAVENOUS | Status: DC | PRN
  Administered 2021-03-01: 10 mL

## 2021-03-01 MED ORDER — SODIUM CHLORIDE 0.9 % IV SOLN: Freq: Once | INTRAVENOUS | Status: AC

## 2021-03-01 MED ORDER — SODIUM CHLORIDE 0.9 % IV SOLN
800.0000 mg/m2 | Freq: Once | INTRAVENOUS | Status: AC
  Administered 2021-03-01: 1634 mg via INTRAVENOUS
  Filled 2021-03-01: qty 42.98

## 2021-03-01 NOTE — Progress Notes (Signed)
[facility] Telephone:([phone]) [phone]   Fax:([phone]  OFFICE PROGRESS NOTE  Micheal Oliver, Micheal Filler, MD [address] [place] [address]  DIAGNOSIS: Stage IV (T3, N2, M1a) non-small cell lung cancer, adenocarcinoma presented with large right upper lobe lung mass extending to the right suprahilar region in addition to right hilar and mediastinal lymphadenopathy with obstruction of the associated upper lobe bronchus and bilateral subcentimeter pulmonary nodules diagnosed in July 2021.  Biomarker Findings Microsatellite status - MS-Stable Tumor Mutational Burden - 3 Muts/Mb Genomic Findings For a complete list of the genes assayed, please refer to the Appendix. ERBB2 [id number], amplification - equivocal? HGF amplification - equivocal? MTAP loss exons 5-8 MYC amplification - equivocal? CDKN2A/B CDKN2B loss, CDKN2A loss CUL3 Y72f*19 RPTOR amplification TP53 [id number] 7 Disease relevant genes with no reportable alterations: ALK, BRAF, EGFR, KRAS, MET, RET, ROS1  PDL1 Expression: Negative  PRIOR THERAPY:  1) Palliative radiotherapy to the obstructive lung mass under the care of Dr. MoLisbeth RenshawThe past treatment was scheduled for 11/23/19. 2) Systemic chemotherapy with carboplatin for AUC of 5, Alimta 500 mg/M2 and Keytruda 200 mg IV every 3 weeks.  First dose November 04, 2019.  Status post 9 cycles.  Starting from cycle #5 he will be on maintenance treatment with Alimta and Keytruda every 3 weeks. 3) Second line systemic chemotherapy with docetaxel 75 mg/M2 and Cyramza 10 mg/KG every weeks with Neulasta support.  First dose May 18, 2020.  Status post 2 cycles.  Last dose was given June 17, 2020 discontinued secondary to disease progression.  CURRENT THERAPY: Third line systemic chemotherapy with single agent gemcitabine 1000 mg/M2 on days 1 and 8 every 3 weeks.  First dose July 13, 2020.  Status post 11 cycles.  Starting from cycle #7 his dose  of gemcitabine was reduced to 800 Mg/M2 secondary to chemotherapy-induced neutropenia.  INTERVAL HISTORY: [name]70.o. male returns to the clinic today for follow-up visit.  The patient is feeling fine today with no concerning complaints except for the baseline fatigue.  He also has shortness of breath with exertion.  He continues to have drainage of the right pleural effusion around 450 mL on daily basis.  He denied having any chest pain, cough or hemoptysis.  He denied having any fever or chills.  He has no significant headache or visual changes.  He has few pounds of weight loss.   MEDICAL HISTORY: Past Medical History:  Diagnosis Date   Chicken pox    DM2 (di[place], type 2) [place]   History of kidney stones    passed several    Hypertension    Kidney stones    5-6 times   lung ca 10/2019   Measles    Mumps    [place]   [place] [facility], last one was 09/18/2019- has some expressive aphasia    ALLERGIES:  is allergic to bee venom.  MEDICATIONS:  Current Outpatient Medications  Medication Sig Dispense Refill   albuterol (VENTOLIN HFA) 108 (90 Base) MCG/ACT inhaler Inhale 2 puffs into the lungs every 6 (six) hours as needed for wheezing or shortness of breath. 8 g 2   apixaban (ELIQUIS) 5 MG TABS tablet Take 1 tablet (5 mg total) by mouth 2 (two) times daily. 60 tablet 11   atorvastatin (LIPITOR) 40 MG tablet TAKE ONE TABLET BY MOUTH ONE TIME DAILY 6PM 90 tablet 3   cephALEXin (KEFLEX) 500 MG capsule Take  1 capsule (500 mg total) by mouth 3 (three) times daily. 21 capsule 0   diltiazem (CARDIZEM CD) 180 MG 24 hr capsule Take 1 capsule (180 mg total) by mouth daily. (Patient taking differently: Take 180 mg by mouth every evening.) 30 capsule 6   diphenhydrAMINE HCl, Sleep, (ZZZQUIL) 25 MG CAPS Take 25 mg by mouth at bedtime.     lidocaine-prilocaine (EMLA) cream Apply to the Port-A-Cath site 30-60-minute before treatment 30 g 0   MELATONIN PO Take 5 mg by mouth at  bedtime as needed (sleep).     Omega-3 Fatty Acids (FISH OIL) 1200 MG CAPS Take 1,200 mg by mouth in the morning.     HYDROcodone bit-homatropine (HYCODAN) 5-1.5 MG/5ML syrup Take 5 mLs by mouth every 6 (six) hours as needed for cough. (Patient not taking: Reported on 03/01/2021) 473 mL 0   metFORMIN (GLUCOPHAGE) 500 MG tablet TAKE TWO TABLETS BY MOUTH TWICE A DAY WITH MEAL(S) (Patient taking differently: Take 500 mg by mouth 2 (two) times daily. TAKE 1 TABLETS BY MOUTH TWICE A DAY WITH MEAL(S)) 400 tablet 3   prochlorperazine (COMPAZINE) 10 MG tablet Take 1 tablet (10 mg total) by mouth every 6 (six) hours as needed. (Patient not taking: Reported on 03/01/2021) 30 tablet 2   No current facility-administered medications for this visit.    SURGICAL HISTORY:  Past Surgical History:  Procedure Laterality Date   COLONOSCOPY W/ POLYPECTOMY     IR IMAGING GUIDED PORT INSERTION  12/12/2020   IR THORACENTESIS ASP PLEURAL SPACE W/IMG GUIDE  02/03/2021   THORACENTESIS N/A 02/16/2021   Procedure: Micheal Oliver;  Surgeon: Micheal Furbish, MD;  [facility] ENDOSCOPY;  Service: Pulmonary;  Laterality: N/A;   VIDEO BRONCHOSCOPY WITH ENDOBRONCHIAL ULTRASOUND N/A 09/30/2019   Procedure: VIDEO BRONCHOSCOPY WITH ENDOBRONCHIAL ULTRASOUND;  Surgeon: Micheal Gobble, MD;  Location: [facility];  Service: Thoracic;  Laterality: N/A;   WISDOM TOOTH EXTRACTION  1976-77    REVIEW OF SYSTEMS:  A comprehensive review of systems was negative except for: Constitutional: positive for fatigue Respiratory: positive for dyspnea on exertion   PHYSICAL EXAMINATION: General appearance: alert, cooperative, fatigued, and no distress Head: Normocephalic, without obvious abnormality, atraumatic, Right eyelid droop Neck: no adenopathy, no JVD, supple, symmetrical, trachea midline, and thyroid not enlarged, symmetric, no tenderness/mass/nodules Lymph nodes: Cervical, supraclavicular, and axillary nodes normal. Resp: clear to auscultation  bilaterally Back: symmetric, no curvature. ROM normal. No CVA tenderness. Cardio: regular rate and rhythm, S1, S2 normal, no murmur, click, rub or gallop GI: soft, non-tender; bowel sounds normal; no masses,  no organomegaly Extremities: extremities normal, atraumatic, no cyanosis or edema  ECOG PERFORMANCE STATUS: 1 - Symptomatic but completely ambulatory  Blood pressure 107/79, pulse 74, temperature (!) 96.8 F (36 C), temperature source Tympanic, resp. rate 19, height 6' 2"  (1.88 m), weight 154 lb 11.2 oz (70.2 kg), SpO2 96 %.  LABORATORY DATA: Lab Results  Component Value Date   WBC 7.4 03/01/2021   HGB 9.9 (L) 03/01/2021   HCT 30.0 (L) 03/01/2021   MCV 110.7 (H) 03/01/2021   PLT 236 03/01/2021      Chemistry      Component Value Date/Time   NA 139 02/15/2021 1412   K 4.0 02/15/2021 1412   CL 103 02/15/2021 1412   CO2 27 02/15/2021 1412   BUN 30 (H) 02/15/2021 1412   CREATININE 1.06 02/15/2021 1412   CREATININE 1.13 10/16/2013 1118      Component Value Date/Time   CALCIUM  8.9 02/15/2021 1412   ALKPHOS 88 02/15/2021 1412   AST 49 (H) 02/15/2021 1412   ALT 53 (H) 02/15/2021 1412   BILITOT 0.6 02/15/2021 1412       RADIOGRAPHIC STUDIES: DG Chest 1 View  Result Date: 02/03/2021 CLINICAL DATA:  s/p right thoracentesis EXAM: CHEST  1 VIEW COMPARISON:  January 20, 2021 FINDINGS: The cardiomediastinal silhouette is unchanged in contour.RIGHT chest port with tip terminating over the RIGHT atrium. Cardiac loop recorder. Moderate RIGHT pleural effusion. Small LEFT pleural effusion. No significant pneumothorax. Revisualization of a RIGHT paramediastinal mass and multiple bilateral pulmonary nodules consistent with known malignancy. Bibasilar homogeneous opacification most consistent with atelectasis. Visualized abdomen is unremarkable. IMPRESSION: Moderate RIGHT and small LEFT pleural effusion. Electronically Signed   By: Valentino Saxon M.D.   On: 02/03/2021 13:45   DG  CHEST PORT 1 VIEW  Result Date: 02/16/2021 CLINICAL DATA:  Chest tube placement EXAM: PORTABLE CHEST 1 VIEW COMPARISON:  02/03/2021 FINDINGS: Power jet double Port-A-Cath tip: Right atrium. Loop recorder noted. A right-sided chest tube is been placed with tip near the hemithoracic apex. Moderate but reduced right pleural effusion, with improved aeration at the right lung base. Small right apical pneumothorax. Small to moderate left pleural effusion. Right upper lobe atelectasis. Scattered nodules are noted in the lungs, more perceptible in the left lung. IMPRESSION: 1. Interval placement of a right-sided chest tube with reduced size of the right pleural effusion and improved aeration of the right lung base. There is a 5-10% right apical pneumothorax (chest tube already in place). 2. Continued atelectasis especially in the right upper lobe and right perihilar region. 3. Stable small to moderate left pleural effusion. 4. Stable scattered bilateral pulmonary nodules more perceptible in the left lung. Electronically Signed   By: Van Clines M.D.   On: 02/16/2021 15:46   CUP PACEART REMOTE DEVICE CHECK  Result Date: 02/27/2021 ILR summary report received. Battery status OK. Normal device function. No new symptom, tachy, brady, or pause episodes. AF burden 16.5%.since last remote 10/25. Persistent AF. +OAC. Monthly summary reports and ROV/PRN LH  IR THORACENTESIS ASP PLEURAL SPACE W/IMG GUIDE  Result Date: 02/03/2021 INDICATION: Patient history of lung cancer with recurrent right-sided pleural effusion. Presents for therapeutic thoracentesis. EXAM: ULTRASOUND GUIDED THERAPEUTIC THORACENTESIS MEDICATIONS: Lidocaine 1% 10 mL COMPLICATIONS: None immediate. PROCEDURE: An ultrasound guided thoracentesis was thoroughly discussed with the patient and questions answered. The benefits, risks, alternatives and complications were also discussed. The patient understands and wishes to proceed with the procedure.  Written consent was obtained. Ultrasound was performed to localize and mark an adequate pocket of fluid in the right chest. The area was then prepped and draped in the normal sterile fashion. 1% Lidocaine was used for local anesthesia. Under ultrasound guidance a 19 gauge, 7-cm, Yueh catheter was introduced. Thoracentesis was performed. The catheter was removed and a dressing applied. FINDINGS: A total of approximately 1.45 L of straw-colored fluid was removed. IMPRESSION: Successful ultrasound guided right-sided therapeutic thoracentesis. Read by: Rushie Nyhan, NP Electronically Signed   By: Corrie Mckusick D.O.   On: 02/03/2021 16:09     ASSESSMENT AND PLAN: This is a very pleasant 70 years old white male recently diagnosed with a stage IV (T3, N2, M1 a) non-small cell lung cancer, adenocarcinoma presented with large right upper lobe lung mass with extension to the right suprahilar region as well as right hilar and mediastinal lymphadenopathy with obstruction of the right upper lobe and bilateral pulmonary nodules diagnosed in  July 2021. The molecular study shows no actionable mutations and PD-L1 expression is negative. He had a short course of palliative radiotherapy to the obstructive lung mass under the care of Dr. Lisbeth Oliver. The patient underwent systemic chemotherapy with carboplatin for AUC of 5, Alimta 500 mg/M2 and Keytruda 200 mg IV every 3 weeks status post 9 cycles.  Starting from cycle #5 he will be on maintenance treatment with Alimta and Keytruda every 3 weeks.  He has been tolerating this treatment well with no concerning complaints except for fatigue and cough.  This treatment was discontinued secondary to disease progression. The patient underwent palliative second line systemic chemotherapy with docetaxel 75 mg/M2 and Cyramza 10 mg/KG every 3 weeks with Neulasta support.  Started May 18, 2020.  Status post 2 cycles. Unfortunately his scan showed evidence for disease progression and  multiple bilateral pulmonary nodules but stable disease in the dominant mass and right pleural effusion. He is currently undergoing palliative systemic chemotherapy with single agent gemcitabine 1000 MG/M2 IV on days 1 and 8 every 3 weeks.  Status post 11 cycles.  The patient has been tolerating this treatment well except for fatigue and the chemotherapy-induced neutropenia after cycle #6. Starting from cycle #7 his dose of gemcitabine was reduced to 800 Mg/M2 on days 1 and 8 every 3 weeks. The patient continues to tolerate his treatment with reduced dose gemcitabine fairly well. I recommended for him to proceed with cycle #12 today as planned. I will see him back for follow-up visit in 3 weeks for evaluation with repeat CT scan of the chest, abdomen and pelvis for restaging of his disease. For the recurrent right pleural effusion, he underwent ultrasound-guided right thoracentesis last week with improvement of his symptoms.  He is meeting with pulmonary medicine for consideration of Pleurx catheter placement. For the persistent cough, I will give him refill of Hycodan. The patient was advised to call immediately if he has any other concerning symptoms in the interval.  The patient voices understanding of current disease status and treatment options and is in agreement with the current care plan.  All questions were answered. The patient knows to call the clinic with any problems, questions or concerns. We can certainly see the patient much sooner if necessary.  Disclaimer: This note was dictated with voice recognition software. Similar sounding words can inadvertently be transcribed and may not be corrected upon review.

## 2021-03-01 NOTE — Patient Instructions (Signed)
[facility]  Discharge Instructions: Thank you for choosing [facility] to provide your oncology and hematology care.   If you have a lab appointment with the [facility], please go directly to the [facility] and check in at the registration area.   Wear comfortable clothing and clothing appropriate for easy access to any Portacath or PICC line.   We strive to give you quality time with your provider. You may need to reschedule your appointment if you arrive late (15 or more minutes).  Arriving late affects you and other patients whose appointments are after yours.  Also, if you miss three or more appointments without notifying the office, you may be dismissed from the clinic at the provider's discretion.      For prescription refill requests, have your pharmacy contact our office and allow 72 hours for refills to be completed.    Today you received the following chemotherapy and/or immunotherapy agents gemzar      To help prevent nausea and vomiting after your treatment, we encourage you to take your nausea medication as directed.  BELOW ARE SYMPTOMS THAT SHOULD BE REPORTED IMMEDIATELY: *FEVER GREATER THAN 100.4 F (38 C) OR HIGHER *CHILLS OR SWEATING *NAUSEA AND VOMITING THAT IS NOT CONTROLLED WITH YOUR NAUSEA MEDICATION *UNUSUAL SHORTNESS OF BREATH *UNUSUAL BRUISING OR BLEEDING *URINARY PROBLEMS (pain or burning when urinating, or frequent urination) *BOWEL PROBLEMS (unusual diarrhea, constipation, pain near the anus) TENDERNESS IN MOUTH AND THROAT WITH OR WITHOUT PRESENCE OF ULCERS (sore throat, sores in mouth, or a toothache) UNUSUAL RASH, SWELLING OR PAIN  UNUSUAL VAGINAL DISCHARGE OR ITCHING   Items with * indicate a potential emergency and should be followed up as soon as possible or go to the Emergency Department if any problems should occur.  Please show the CHEMOTHERAPY ALERT CARD or IMMUNOTHERAPY ALERT CARD at check-in to the  Emergency Department and triage nurse.  Should you have questions after your visit or need to cancel or reschedule your appointment, please contact [facility]  Dept: [phone]  and follow the prompts.  Office hours are 8:00 a.m. to 4:30 p.m. Monday - Friday. Please note that voicemails left after 4:00 p.m. may not be returned until the following business day.  We are closed weekends and major holidays. You have access to a nurse at all times for urgent questions. Please call the main number to the clinic Dept: [phone] and follow the prompts.   For any non-urgent questions, you may also contact your provider using MyChart. We now offer e-Visits for anyone 38 and older to request care online for non-urgent symptoms. For details visit [email or web address].   Also download the MyChart app! Go to the app store, search "MyChart", open the app, select [facility], and log in with your MyChart username and password.  Due to Covid, a mask is required upon entering the hospital/clinic. If you do not have a mask, one will be given to you upon arrival. For doctor visits, patients may have 1 support person aged 91 or older with them. For treatment visits, patients cannot have anyone with them due to current Covid guidelines and our immunocompromised population.

## 2021-03-01 NOTE — Progress Notes (Signed)
Micheal Oliver, Micheal Oliver ([id number]) Visit Report for 02/28/2021 HPI Details Patient Name: Date of Service: Micheal Oliver, Micheal Oliver 02/28/2021 2:00 PM Medical Record Number: [id number] Patient Account Number: [id number] Date of Birth/Sex: Treating RN: November 05, 1950 (70 y.o. Oliver) Primary Care Provider: Lamar Blinks Other Clinician: Referring Provider: Treating Provider/Extender: Delton See in Treatment: 6 History of Present Illness HPI Description: ADMISSION 01/16/2021 This is an unfortunate 70 year old man who has stage IV adenocarcinoma of the right upper lobe of the lung. He received radiation and is now on his third combination of keynote chemotherapy. Apparently has had limited respone to the prior to rounds of chemotherapy. As result of this he has lost 60 pounds of weight he has a marginal intake. He started to become aware of pain in his buttock area on the right several weeks ago. He asked his oncologist to look at it and they correctly call this is stage II pressure ulcer on the right buttock and referred him here. He has been using Vaseline gauze. He says the area is very painful however it is really quite small in terms of surface area Past medical history includes type 2 diabetes, hypertension, CVA 2 years ago, right upper lobe adeno CA of the lung stage IV 11/1; 2-week follow-up. Wound is smaller but still complaining of a lot of pain in the area. 11/15; using polymen. 2 small eschars which I removed there is still 2 small open areas in her right buttock. He says he is lost another 10 pounds still on chemotherapy 11/29; 2-week follow-up. He has a pinpoint area under illumination on the right buttock he has been using polymen Ag Electronic Signature(s) Signed: 02/28/2021 5:06:47 PM By: Linton Ham MD Entered By: Linton Ham on 02/28/2021 15:05:35 -------------------------------------------------------------------------------- Physical Exam Details Patient  Name: Date of Service: Micheal Oliver 02/28/2021 2:00 PM Medical Record Number: [id number] Patient Account Number: [id number] Date of Birth/Sex: Treating RN: 09-22-1950 (70 y.o. Oliver) Primary Care Provider: Lamar Blinks Other Clinician: Referring Provider: Treating Provider/Extender: Delton See in Treatment: 6 Constitutional Sitting or standing Blood Pressure is within target range for patient.. Pulse regular and within target range for patient.Marland Kitchen Respirations regular, non-labored and within target range.. Temperature is normal and within the target range for the patient.Marland Kitchen Appears in no distress. Notes Wound exam; the area in question is on the right buttock close proximity to the gluteal cleft. You can only see a small pinpoint area under illumination much better than 2 weeks ago no evidence surrounding infection. There is no measurable depth of this Electronic Signature(s) Signed: 02/28/2021 5:06:47 PM By: Linton Ham MD Entered By: Linton Ham on 02/28/2021 15:07:17 -------------------------------------------------------------------------------- Physician Orders Details Patient Name: Date of Service: Micheal Oliver 02/28/2021 2:00 PM Medical Record Number: [id number] Patient Account Number: [id number] Date of Birth/Sex: Treating RN: 1950-10-09 (70 y.o. Janyth Contes Primary Care Provider: Lamar Blinks Other Clinician: Referring Provider: Treating Provider/Extender: Delton See in Treatment: 6 Verbal / Phone Orders: No Diagnosis Coding ICD-10 Coding Code Description [phone] Pressure ulcer of right buttock, stage 2 R63.4 Abnormal weight loss C34.11 Malignant neoplasm of upper lobe, right bronchus or lung Follow-up Appointments ppointment in 2 weeks. - Dr. Dellia Nims Return A Bathing/ Shower/ Hygiene May shower and wash wound with soap and water. - with dressing changes Off-Loading Turn and reposition  every 2 hours - avoid sleeping in chair, Additional Orders / Instructions Follow Nutritious Diet - 1-2 Ensure per day Wound Treatment Wound #1 - Gluteus Wound Laterality:  Right Cleanser: Normal Saline (Generic) Every Other Day/30 Days Discharge Instructions: Cleanse the wound with Normal Saline prior to applying a clean dressing using gauze sponges, not tissue or cotton balls. Cleanser: Soap and Water Every Other Day/30 Days Discharge Instructions: May shower and wash wound with dial antibacterial soap and water prior to dressing change. Prim Dressing: PolyMem Non-Adhesive Dressing, 4x4 in Every Other Day/30 Days ary Discharge Instructions: Apply to wound bed as instructed Secondary Dressing: Bordered Gauze, 4x4 in (DME) (Generic) Every Other Day/30 Days Discharge Instructions: Apply over primary dressing as directed. Electronic Signature(s) Signed: 02/28/2021 5:06:47 PM By: Linton Ham MD Signed: 02/28/2021 5:45:41 PM By: Levan Hurst RN, BSN Entered By: Levan Hurst on 02/28/2021 15:20:17 -------------------------------------------------------------------------------- Problem List Details Patient Name: Date of Service: Micheal Oliver, Micheal Oliver 02/28/2021 2:00 PM Medical Record Number: [id number] Patient Account Number: [id number] Date of Birth/Sex: Treating RN: 05-07-1950 (70 y.o. Janyth Contes Primary Care Provider: Lamar Blinks Other Clinician: Referring Provider: Treating Provider/Extender: Delton See in Treatment: 6 Active Problems ICD-10 Encounter Code Description Active Date MDM Diagnosis L89.312 Pressure ulcer of right buttock, stage 2 01/16/2021 No Yes R63.4 Abnormal weight loss 01/16/2021 No Yes C34.11 Malignant neoplasm of upper lobe, right bronchus or lung 01/16/2021 No Yes Inactive Problems Resolved Problems Electronic Signature(s) Signed: 02/28/2021 5:06:47 PM By: Linton Ham MD Entered By: Linton Ham on  02/28/2021 15:03:29 -------------------------------------------------------------------------------- Progress Note Details Patient Name: Date of Service: Micheal Oliver 02/28/2021 2:00 PM Medical Record Number: [id number] Patient Account Number: [id number] Date of Birth/Sex: Treating RN: 22-Jun-1950 (70 y.o. Oliver) Primary Care Provider: Lamar Blinks Other Clinician: Referring Provider: Treating Provider/Extender: Delton See in Treatment: 6 Subjective History of Present Illness (HPI) ADMISSION 01/16/2021 This is an unfortunate 70 year old man who has stage IV adenocarcinoma of the right upper lobe of the lung. He received radiation and is now on his third combination of keynote chemotherapy. Apparently has had limited respone to the prior to rounds of chemotherapy. As result of this he has lost 60 pounds of weight he has a marginal intake. He started to become aware of pain in his buttock area on the right several weeks ago. He asked his oncologist to look at it and they correctly call this is stage II pressure ulcer on the right buttock and referred him here. He has been using Vaseline gauze. He says the area is very painful however it is really quite small in terms of surface area Past medical history includes type 2 diabetes, hypertension, CVA 2 years ago, right upper lobe adeno CA of the lung stage IV 11/1; 2-week follow-up. Wound is smaller but still complaining of a lot of pain in the area. 11/15; using polymen. 2 small eschars which I removed there is still 2 small open areas in her right buttock. He says he is lost another 10 pounds still on chemotherapy 11/29; 2-week follow-up. He has a pinpoint area under illumination on the right buttock he has been using polymen Ag Objective Constitutional Sitting or standing Blood Pressure is within target range for patient.. Pulse regular and within target range for patient.Marland Kitchen Respirations regular, non-labored  and within target range.. Temperature is normal and within the target range for the patient.Marland Kitchen Appears in no distress. Vitals Time Taken: 2:46 PM, Height: 74 in, Weight: 165 lbs, BMI: 21.2, Temperature: 97.7 F, Pulse: 70 bpm, Respiratory Rate: 16 breaths/min, Blood Pressure: 102/68 mmHg. General Notes: Wound exam; the area in question is on the right buttock close proximity  to the gluteal cleft. You can only see a small pinpoint area under illumination much better than 2 weeks ago no evidence surrounding infection. There is no measurable depth of this Integumentary (Hair, Skin) Wound #1 status is Open. Original cause of wound was Pressure Injury. The date acquired was: 12/15/2020. The wound has been in treatment 6 weeks. The wound is located on the Right Gluteus. The wound measures 0.1cm length x 0.1cm width x 0.1cm depth; 0.008cm^2 area and 0.001cm^3 volume. The wound is limited to skin breakdown. There is no tunneling or undermining noted. There is a medium amount of serosanguineous drainage noted. The wound margin is distinct with the outline attached to the wound base. There is large (67-100%) pink granulation within the wound bed. There is no necrotic tissue within the wound bed. Assessment Active Problems ICD-10 Pressure ulcer of right buttock, stage 2 Abnormal weight loss Malignant neoplasm of upper lobe, right bronchus or lung Plan Follow-up Appointments: Return Appointment in 2 weeks. - Dr. Dellia Nims Bathing/ Shower/ Hygiene: May shower and wash wound with soap and water. - with dressing changes Off-Loading: Turn and reposition every 2 hours - avoid sleeping in chair, Additional Orders / Instructions: Follow Nutritious Diet - 1-2 Ensure per day WOUND #1: - Gluteus Wound Laterality: Right Cleanser: Normal Saline (Generic) Every Other Day/30 Days Discharge Instructions: Cleanse the wound with Normal Saline prior to applying a clean dressing using gauze sponges, not tissue or cotton  balls. Cleanser: Soap and Water Every Other Day/30 Days Discharge Instructions: May shower and wash wound with dial antibacterial soap and water prior to dressing change. Prim Dressing: PolyMem Non-Adhesive Dressing, 4x4 in Every Other Day/30 Days ary Discharge Instructions: Apply to wound bed as instructed Secondary Dressing: Zetuvit Plus Silicone Border Dressing 4x4 (in/in) (Generic) Every Other Day/30 Days Discharge Instructions: Apply silicone border over primary dressing as directed. #1 I am continuing with the polymen and rigorous offloading 2. This is so close to closing it may not be necessary for him to come back in 2 weeks and follow-up if they are convinced that everything is closed under illumination which is the only way I could see this [reflection of my penlight] Electronic Signature(s) Signed: 02/28/2021 5:06:47 PM By: Linton Ham MD Entered By: Linton Ham on 02/28/2021 15:08:21 -------------------------------------------------------------------------------- SuperBill Details Patient Name: Date of Service: Micheal Oliver 02/28/2021 Medical Record Number: [id number] Patient Account Number: [id number] Date of Birth/Sex: Treating RN: 1950/11/15 (70 y.o. Oliver) Primary Care Provider: Lamar Blinks Other Clinician: Referring Provider: Treating Provider/Extender: Delton See in Treatment: 6 Diagnosis Coding ICD-10 Codes Code Description [phone] Pressure ulcer of right buttock, stage 2 R63.4 Abnormal weight loss C34.11 Malignant neoplasm of upper lobe, right bronchus or lung Facility Procedures CPT4 Code: [id number] Description: 99213 - WOUND CARE VISIT-LEV 3 EST PT Modifier: Quantity: 1 Physician Procedures : CPT4 Code Description Modifier [id number] 99213 - WC PHYS LEVEL 3 - EST PT ICD-10 Diagnosis Description Z60.109 Pressure ulcer of right buttock, stage 2 R63.4 Abnormal weight loss C34.11 Malignant neoplasm of upper lobe, right  bronchus or lung Quantity: 1 Electronic Signature(s) Signed: 02/28/2021 5:45:41 PM By: Levan Hurst RN, BSN Signed: 03/01/2021 4:53:46 PM By: Linton Ham MD Previous Signature: 02/28/2021 5:06:47 PM Version By: Linton Ham MD Entered By: Levan Hurst on 02/28/2021 17:28:43

## 2021-03-02 ENCOUNTER — Other Ambulatory Visit: Payer: Self-pay

## 2021-03-02 MED ORDER — DILTIAZEM HCL ER COATED BEADS 180 MG PO CP24: 180.0000 mg | ORAL_CAPSULE | Freq: Every day | ORAL | 0 refills | Status: AC

## 2021-03-02 NOTE — Telephone Encounter (Signed)
This is a A-Fib clinic pt 

## 2021-03-02 NOTE — Telephone Encounter (Signed)
Pt is followed by the A-Fib clinic and supposed to return in 6 mos. Please address

## 2021-03-07 ENCOUNTER — Other Ambulatory Visit: Payer: Self-pay

## 2021-03-07 ENCOUNTER — Ambulatory Visit (HOSPITAL_BASED_OUTPATIENT_CLINIC_OR_DEPARTMENT_OTHER)
Admission: RE | Admit: 2021-03-07 | Discharge: 2021-03-07 | Disposition: A | Discharge status: Home / Self Care | Payer: Medicare HMO | Source: Ambulatory Visit | Attending: Pulmonary Disease | Admitting: Pulmonary Disease

## 2021-03-07 DIAGNOSIS — R0600 Dyspnea, unspecified: Secondary | ICD-10-CM | POA: Insufficient documentation

## 2021-03-07 DIAGNOSIS — J939 Pneumothorax, unspecified: Secondary | ICD-10-CM | POA: Diagnosis not present

## 2021-03-07 DIAGNOSIS — J90 Pleural effusion, not elsewhere classified: Secondary | ICD-10-CM | POA: Insufficient documentation

## 2021-03-07 DIAGNOSIS — Z79899 Other long term (current) drug therapy: Secondary | ICD-10-CM | POA: Insufficient documentation

## 2021-03-07 DIAGNOSIS — Z0189 Encounter for other specified special examinations: Secondary | ICD-10-CM

## 2021-03-07 DIAGNOSIS — I10 Essential (primary) hypertension: Secondary | ICD-10-CM | POA: Insufficient documentation

## 2021-03-07 DIAGNOSIS — R0609 Other forms of dyspnea: Secondary | ICD-10-CM

## 2021-03-07 DIAGNOSIS — E118 Type 2 diabetes mellitus with unspecified complications: Secondary | ICD-10-CM | POA: Insufficient documentation

## 2021-03-07 DIAGNOSIS — I4891 Unspecified atrial fibrillation: Secondary | ICD-10-CM | POA: Insufficient documentation

## 2021-03-07 DIAGNOSIS — I351 Nonrheumatic aortic (valve) insufficiency: Secondary | ICD-10-CM | POA: Insufficient documentation

## 2021-03-07 DIAGNOSIS — J860 Pyothorax with fistula: Secondary | ICD-10-CM | POA: Diagnosis not present

## 2021-03-07 LAB — ECHOCARDIOGRAM COMPLETE
Area-P 1/2: 2.88 cm2
Calc EF: 38.7 %
S' Lateral: 3.3 cm
Single Plane A2C EF: 36.7 %
Single Plane A4C EF: 49.2 %

## 2021-03-07 NOTE — Progress Notes (Signed)
  Echocardiogram 2D Echocardiogram has been performed.  Bobbye Charleston 03/07/2021, 12:14 PM

## 2021-03-07 NOTE — Progress Notes (Signed)
[facility] Summary Report / Loop Recorder 

## 2021-03-08 ENCOUNTER — Ambulatory Visit: Payer: Medicare HMO | Admitting: Dietician

## 2021-03-08 ENCOUNTER — Inpatient Hospital Stay (HOSPITAL_COMMUNITY): Payer: Medicare HMO

## 2021-03-08 ENCOUNTER — Emergency Department (HOSPITAL_COMMUNITY): Payer: Medicare HMO

## 2021-03-08 ENCOUNTER — Inpatient Hospital Stay: Payer: Medicare HMO | Attending: Internal Medicine

## 2021-03-08 ENCOUNTER — Other Ambulatory Visit: Payer: Self-pay

## 2021-03-08 ENCOUNTER — Ambulatory Visit (HOSPITAL_BASED_OUTPATIENT_CLINIC_OR_DEPARTMENT_OTHER): Payer: Medicare HMO | Admitting: Physician Assistant

## 2021-03-08 ENCOUNTER — Encounter (HOSPITAL_COMMUNITY): Payer: Self-pay

## 2021-03-08 ENCOUNTER — Inpatient Hospital Stay (HOSPITAL_COMMUNITY)
Admission: EM | Admit: 2021-03-08 | Discharge: 2021-03-10 | DRG: 178 | Disposition: A | Discharge status: Hospice | Payer: Medicare HMO | Source: Ambulatory Visit | Attending: Pulmonary Disease | Admitting: Pulmonary Disease

## 2021-03-08 ENCOUNTER — Inpatient Hospital Stay: Payer: Medicare HMO

## 2021-03-08 ENCOUNTER — Inpatient Hospital Stay: Payer: Medicare HMO | Admitting: Dietician

## 2021-03-08 DIAGNOSIS — Z9221 Personal history of antineoplastic chemotherapy: Secondary | ICD-10-CM | POA: Diagnosis not present

## 2021-03-08 DIAGNOSIS — I3139 Other pericardial effusion (noninflammatory): Secondary | ICD-10-CM | POA: Diagnosis not present

## 2021-03-08 DIAGNOSIS — Z7901 Long term (current) use of anticoagulants: Secondary | ICD-10-CM

## 2021-03-08 DIAGNOSIS — Z681 Body mass index (BMI) 19 or less, adult: Secondary | ICD-10-CM

## 2021-03-08 DIAGNOSIS — Z8249 Family history of ischemic heart disease and other diseases of the circulatory system: Secondary | ICD-10-CM | POA: Diagnosis not present

## 2021-03-08 DIAGNOSIS — I69320 Aphasia following cerebral infarction: Secondary | ICD-10-CM

## 2021-03-08 DIAGNOSIS — J860 Pyothorax with fistula: Principal | ICD-10-CM | POA: Diagnosis present

## 2021-03-08 DIAGNOSIS — J948 Other specified pleural conditions: Secondary | ICD-10-CM | POA: Diagnosis not present

## 2021-03-08 DIAGNOSIS — Z7984 Long term (current) use of oral hypoglycemic drugs: Secondary | ICD-10-CM

## 2021-03-08 DIAGNOSIS — E785 Hyperlipidemia, unspecified: Secondary | ICD-10-CM | POA: Diagnosis present

## 2021-03-08 DIAGNOSIS — R64 Cachexia: Secondary | ICD-10-CM | POA: Diagnosis not present

## 2021-03-08 DIAGNOSIS — J9601 Acute respiratory failure with hypoxia: Secondary | ICD-10-CM | POA: Diagnosis not present

## 2021-03-08 DIAGNOSIS — Z66 Do not resuscitate: Secondary | ICD-10-CM | POA: Diagnosis not present

## 2021-03-08 DIAGNOSIS — I10 Essential (primary) hypertension: Secondary | ICD-10-CM | POA: Diagnosis present

## 2021-03-08 DIAGNOSIS — R0902 Hypoxemia: Secondary | ICD-10-CM | POA: Diagnosis present

## 2021-03-08 DIAGNOSIS — Z91030 Bee allergy status: Secondary | ICD-10-CM

## 2021-03-08 DIAGNOSIS — C3411 Malignant neoplasm of upper lobe, right bronchus or lung: Secondary | ICD-10-CM | POA: Diagnosis present

## 2021-03-08 DIAGNOSIS — Z79899 Other long term (current) drug therapy: Secondary | ICD-10-CM

## 2021-03-08 DIAGNOSIS — J9811 Atelectasis: Secondary | ICD-10-CM | POA: Diagnosis not present

## 2021-03-08 DIAGNOSIS — C3490 Malignant neoplasm of unspecified part of unspecified bronchus or lung: Secondary | ICD-10-CM | POA: Diagnosis not present

## 2021-03-08 DIAGNOSIS — J939 Pneumothorax, unspecified: Secondary | ICD-10-CM | POA: Diagnosis not present

## 2021-03-08 DIAGNOSIS — Z823 Family history of stroke: Secondary | ICD-10-CM | POA: Diagnosis not present

## 2021-03-08 DIAGNOSIS — R918 Other nonspecific abnormal finding of lung field: Secondary | ICD-10-CM | POA: Diagnosis not present

## 2021-03-08 DIAGNOSIS — R911 Solitary pulmonary nodule: Secondary | ICD-10-CM | POA: Diagnosis not present

## 2021-03-08 DIAGNOSIS — J90 Pleural effusion, not elsewhere classified: Secondary | ICD-10-CM | POA: Diagnosis not present

## 2021-03-08 DIAGNOSIS — J910 Malignant pleural effusion: Secondary | ICD-10-CM | POA: Diagnosis present

## 2021-03-08 DIAGNOSIS — R54 Age-related physical debility: Secondary | ICD-10-CM | POA: Diagnosis present

## 2021-03-08 DIAGNOSIS — I480 Paroxysmal atrial fibrillation: Secondary | ICD-10-CM | POA: Diagnosis present

## 2021-03-08 DIAGNOSIS — Z20822 Contact with and (suspected) exposure to covid-19: Secondary | ICD-10-CM | POA: Diagnosis not present

## 2021-03-08 DIAGNOSIS — E1169 Type 2 diabetes mellitus with other specified complication: Secondary | ICD-10-CM | POA: Diagnosis not present

## 2021-03-08 DIAGNOSIS — L8990 Pressure ulcer of unspecified site, unspecified stage: Secondary | ICD-10-CM | POA: Insufficient documentation

## 2021-03-08 DIAGNOSIS — J9383 Other pneumothorax: Secondary | ICD-10-CM | POA: Diagnosis not present

## 2021-03-08 DIAGNOSIS — Z515 Encounter for palliative care: Secondary | ICD-10-CM | POA: Diagnosis not present

## 2021-03-08 DIAGNOSIS — R0602 Shortness of breath: Secondary | ICD-10-CM | POA: Diagnosis not present

## 2021-03-08 DIAGNOSIS — R627 Adult failure to thrive: Secondary | ICD-10-CM | POA: Diagnosis present

## 2021-03-08 DIAGNOSIS — Z95828 Presence of other vascular implants and grafts: Secondary | ICD-10-CM

## 2021-03-08 LAB — CBC
HCT: 30.1 % — ABNORMAL LOW (ref 39.0–52.0)
Hemoglobin: 9.8 g/dL — ABNORMAL LOW (ref 13.0–17.0)
MCH: 36.8 pg — ABNORMAL HIGH (ref 26.0–34.0)
MCHC: 32.6 g/dL (ref 30.0–36.0)
MCV: 113.2 fL — ABNORMAL HIGH (ref 80.0–100.0)
Platelets: 300 10*3/uL (ref 150–400)
RBC: 2.66 MIL/uL — ABNORMAL LOW (ref 4.22–5.81)
RDW: 19.5 % — ABNORMAL HIGH (ref 11.5–15.5)
WBC: 1.8 10*3/uL — ABNORMAL LOW (ref 4.0–10.5)
nRBC: 14.8 % — ABNORMAL HIGH (ref 0.0–0.2)

## 2021-03-08 LAB — CBC WITH DIFFERENTIAL (CANCER CENTER ONLY)
Abs Immature Granulocytes: 0.39 10*3/uL — ABNORMAL HIGH (ref 0.00–0.07)
Basophils Absolute: 0.1 10*3/uL (ref 0.0–0.1)
Basophils Relative: 4 %
Eosinophils Absolute: 0 10*3/uL (ref 0.0–0.5)
Eosinophils Relative: 1 %
HCT: 29.8 % — ABNORMAL LOW (ref 39.0–52.0)
Hemoglobin: 9.9 g/dL — ABNORMAL LOW (ref 13.0–17.0)
Immature Granulocytes: 23 %
Lymphocytes Relative: 19 %
Lymphs Abs: 0.3 10*3/uL — ABNORMAL LOW (ref 0.7–4.0)
MCH: 36.7 pg — ABNORMAL HIGH (ref 26.0–34.0)
MCHC: 33.2 g/dL (ref 30.0–36.0)
MCV: 110.4 fL — ABNORMAL HIGH (ref 80.0–100.0)
Monocytes Absolute: 0.4 10*3/uL (ref 0.1–1.0)
Monocytes Relative: 21 %
Neutro Abs: 0.6 10*3/uL — ABNORMAL LOW (ref 1.7–7.7)
Neutrophils Relative %: 32 %
Platelet Count: 338 10*3/uL (ref 150–400)
RBC: 2.7 MIL/uL — ABNORMAL LOW (ref 4.22–5.81)
RDW: 19.2 % — ABNORMAL HIGH (ref 11.5–15.5)
WBC Count: 1.7 10*3/uL — ABNORMAL LOW (ref 4.0–10.5)
nRBC: 12.3 % — ABNORMAL HIGH (ref 0.0–0.2)

## 2021-03-08 LAB — RESP PANEL BY RT-PCR (FLU A&B, COVID) ARPGX2
Influenza A by PCR: NEGATIVE
Influenza B by PCR: NEGATIVE
SARS Coronavirus 2 by RT PCR: NEGATIVE

## 2021-03-08 LAB — CMP (CANCER CENTER ONLY)
ALT: 184 U/L — ABNORMAL HIGH (ref 0–44)
AST: 138 U/L — ABNORMAL HIGH (ref 15–41)
Albumin: 2.7 g/dL — ABNORMAL LOW (ref 3.5–5.0)
Alkaline Phosphatase: 132 U/L — ABNORMAL HIGH (ref 38–126)
Anion gap: 13 (ref 5–15)
BUN: 44 mg/dL — ABNORMAL HIGH (ref 8–23)
CO2: 25 mmol/L (ref 22–32)
Calcium: 8.9 mg/dL (ref 8.9–10.3)
Chloride: 105 mmol/L (ref 98–111)
Creatinine: 1.3 mg/dL — ABNORMAL HIGH (ref 0.61–1.24)
GFR, Estimated: 59 mL/min — ABNORMAL LOW (ref >60)
Glucose, Bld: 218 mg/dL — ABNORMAL HIGH (ref 70–99)
Potassium: 4.2 mmol/L (ref 3.5–5.1)
Sodium: 143 mmol/L (ref 135–145)
Total Bilirubin: 1 mg/dL (ref 0.3–1.2)
Total Protein: 5.9 g/dL — ABNORMAL LOW (ref 6.5–8.1)

## 2021-03-08 LAB — BRAIN NATRIURETIC PEPTIDE: B Natriuretic Peptide: 236.7 pg/mL — ABNORMAL HIGH (ref 0.0–100.0)

## 2021-03-08 LAB — TROPONIN I (HIGH SENSITIVITY)
Troponin I (High Sensitivity): 9 ng/L (ref <18)
Troponin I (High Sensitivity): 9 ng/L (ref <18)

## 2021-03-08 LAB — BASIC METABOLIC PANEL
Anion gap: 6 (ref 5–15)
BUN: 46 mg/dL — ABNORMAL HIGH (ref 8–23)
CO2: 27 mmol/L (ref 22–32)
Calcium: 8.8 mg/dL — ABNORMAL LOW (ref 8.9–10.3)
Chloride: 105 mmol/L (ref 98–111)
Creatinine, Ser: 1.14 mg/dL (ref 0.61–1.24)
GFR, Estimated: >60 mL/min (ref >60)
Glucose, Bld: 136 mg/dL — ABNORMAL HIGH (ref 70–99)
Potassium: 4.5 mmol/L (ref 3.5–5.1)
Sodium: 138 mmol/L (ref 135–145)

## 2021-03-08 MED ORDER — SODIUM CHLORIDE 0.9% FLUSH
10.0000 mL | INTRAVENOUS | Status: AC | PRN
  Administered 2021-03-08: 10 mL

## 2021-03-08 MED ORDER — ORAL CARE MOUTH RINSE: 15.0000 mL | Freq: Two times a day (BID) | OROMUCOSAL | Status: DC

## 2021-03-08 MED ORDER — LIDOCAINE-EPINEPHRINE (PF) 2 %-1:200000 IJ SOLN
10.0000 mL | Freq: Once | INTRAMUSCULAR | Status: DC
  Filled 2021-03-08: qty 20

## 2021-03-08 MED ORDER — POLYETHYLENE GLYCOL 3350 17 G PO PACK: 17.0000 g | PACK | Freq: Every day | ORAL | Status: DC | PRN

## 2021-03-08 MED ORDER — CHLORHEXIDINE GLUCONATE 0.12 % MT SOLN
15.0000 mL | Freq: Two times a day (BID) | OROMUCOSAL | Status: DC
  Administered 2021-03-09 – 2021-03-10 (×2): 15 mL via OROMUCOSAL
  Filled 2021-03-08 (×4): qty 15

## 2021-03-08 MED ORDER — ALBUTEROL SULFATE (2.5 MG/3ML) 0.083% IN NEBU: 3.0000 mL | INHALATION_SOLUTION | Freq: Four times a day (QID) | RESPIRATORY_TRACT | Status: DC | PRN

## 2021-03-08 MED ORDER — ENSURE ENLIVE PO LIQD
237.0000 mL | Freq: Two times a day (BID) | ORAL | Status: DC
  Administered 2021-03-09 (×2): 237 mL via ORAL

## 2021-03-08 MED ORDER — APIXABAN 5 MG PO TABS
5.0000 mg | ORAL_TABLET | Freq: Two times a day (BID) | ORAL | Status: DC
  Administered 2021-03-08 – 2021-03-10 (×4): 5 mg via ORAL
  Filled 2021-03-08 (×4): qty 1

## 2021-03-08 MED ORDER — HYDROCODONE BIT-HOMATROP MBR 5-1.5 MG/5ML PO SOLN
5.0000 mL | Freq: Four times a day (QID) | ORAL | Status: DC | PRN
  Administered 2021-03-09 – 2021-03-10 (×2): 5 mL via ORAL
  Filled 2021-03-08 (×2): qty 5

## 2021-03-08 MED ORDER — PROCHLORPERAZINE MALEATE 10 MG PO TABS: 10.0000 mg | ORAL_TABLET | Freq: Four times a day (QID) | ORAL | Status: DC | PRN

## 2021-03-08 MED ORDER — DILTIAZEM HCL ER COATED BEADS 180 MG PO CP24
180.0000 mg | ORAL_CAPSULE | Freq: Every day | ORAL | Status: DC
  Administered 2021-03-08 – 2021-03-10 (×3): 180 mg via ORAL
  Filled 2021-03-08 (×3): qty 1

## 2021-03-08 MED ORDER — CHLORHEXIDINE GLUCONATE CLOTH 2 % EX PADS
6.0000 | MEDICATED_PAD | Freq: Every day | CUTANEOUS | Status: DC
  Administered 2021-03-09: 6 via TOPICAL

## 2021-03-08 MED ORDER — ATORVASTATIN CALCIUM 40 MG PO TABS
40.0000 mg | ORAL_TABLET | Freq: Every day | ORAL | Status: DC
  Administered 2021-03-08 – 2021-03-10 (×3): 40 mg via ORAL
  Filled 2021-03-08 (×3): qty 1

## 2021-03-08 MED ORDER — DOCUSATE SODIUM 100 MG PO CAPS: 100.0000 mg | ORAL_CAPSULE | Freq: Two times a day (BID) | ORAL | Status: DC | PRN

## 2021-03-08 NOTE — Progress Notes (Signed)
Mr. Emel arrived to infusion room. He reports some shortness of breath today. Pulse ox 87-90% on room air. HR ranging from 80's up to 153. K [place] notified. Pt placed on 2 L O2 and O2 sats now 94-98% w/ O2. EKG obtained. Pt transferred to the ED.

## 2021-03-08 NOTE — Progress Notes (Signed)
Good morning,  I saw Micheal Oliver in pulmonary clinic for the pleural effusions. Given bilateral nature of effusions and LE swelling, I ordered a TTE. Shows global reduction in EF, elevated R sided pressures. Unsure if chemo effect or other. Likely element of malignant pleural effusion but suspect cardiomyopathy is contributing.

## 2021-03-08 NOTE — Progress Notes (Signed)
Nutrition Follow-up:  Patient with metastatic NSCLC of right lung. Patient receiving gemcitabine. S/p Pleurx catheter on 11/17   Met with patient during infusion. Patient reports feeling increased shortness of breath today from baseline. RN placed pt on 2 L O2 and EKG ordered. Patient treatment held today, MD recommend for patient present to ED for further evaluation due to labs decreased oxygen saturations. Patient agreeable. Patient reports he has been working on his nutrition, reports eating foods listed on the handouts provided. Patient has increased intake of Ensure, reports drinking 3 on most days. Patient recalls an Ensure this morning for breakfast, ate egg salad for lunch. Patient denies nausea, vomiting, diarrhea, constipation.   Medications: reviewed   Labs: Glucose 218, BUN 44, Cr 1.30, AST 138, ALT 184, Alkaline Phosphatase 132  Anthropometrics: Weight 154 lb 11.2 oz today decreased from 162 lb 8 oz on 11/16  11/10 - 157 lb 8 oz 10/26 - 161 lb  10/5 - 164 lb   NUTRITION DIAGNOSIS: Unintended weight loss ongoing   INTERVENTION:  Continue strategies for increasing calories and protein with small frequent meals and snacks Encouraged high calorie, high protein foods - will mail shake recipes Continue drinking 3 Ensure Plus/equivalent daily - will mail coupons  Patient has contact information    MONITORING, EVALUATION, GOAL: weight trends, intake   NEXT VISIT: Wednesday December 21 during infusion

## 2021-03-08 NOTE — H&P (Signed)
NAME:  Micheal Oliver, MRN:  [id number], DOB:  09-12-1950, LOS: 0 ADMISSION DATE:  03/08/2021, CONSULTATION DATE:  03/08/21 REFERRING MD:  Regenia Skeeter, CHIEF COMPLAINT:  hypoxia   History of Present Illness:  Micheal Oliver is a 70 y/o M with PMH significant for stage IV non-small cell lung Ca on gemcitabine therapy, atrial fibrillation on Eliquis, T2DM, HTN, CVA who presented with acute shortness of breath and hypoxia at his oncology office and was sent to the ED.  CXR showed large R pneumothorax.  Pt was hemodynamically stable on 2L [place].  He has a Pleurx catheter in place on the R since 11/17. He states over the last 2-3 days there has been no drainage and did hear a sucking gush of air recently when accessed.   Pertinent  Medical History   has a past medical history of Chicken pox, DM2 (diabetes mellitus, type 2) ([place]), History of kidney stones, Hypertension, Kidney stones, lung ca (10/2019), Measles, Mumps, and Stroke ([place]).   Significant Hospital Events: Including procedures, antibiotic start and stop dates in addition to other pertinent events   12/7 admit to ICU with pneumothorax  Interim History / Subjective:   Pleurx catheter placed to suction with 400cc pleural fluid and air out.  Objective   Blood pressure 120/88, pulse 94, temperature 98 F (36.7 C), temperature source Oral, resp. rate 20, SpO2 100 %.       No intake or output data in the 24 hours ending 03/08/21 1649 There were no vitals filed for this visit.  General:  thin, chronically ill-appearing M resting in bed in no acute distress HEENT: MM pink/moist, nasal cannula in place Neuro: awake, alert, oriented, moving all extremities CV: s1s2 rrr, no m/r/g PULM:  R pleurx in place, diminished air entry RLL, no accessory muscle use GI: soft, bsx4 active  Extremities: warm/dry, no edema  Skin: no rashes or lesions  Resolved Hospital Problem list     Assessment & Plan:    Acute R Pneumothorax With Pleurx in place, pt  describes lack of drainage for several days and sucking/gushing sound, pneumo possibly secondary to procedural issue when accessing catheter -good air and fluid return when Pleurx attached to [place] drain on suction, Repeat CXR pending -check CT Chest to evaluate for mass eroding into the pleura or other structural causes of pneumo -admit to ICU for close monitoring  -family/patient may need furhter teaching regarding pleurx catheter access  Best Practice (right click and "Reselect all SmartList Selections" daily)   Diet/type: Regular consistency (see orders) DVT prophylaxis: DOAC GI prophylaxis: N/A Lines: N/A Foley:  N/A Code Status:  DNR  Last date of multidisciplinary goals of care discussion [12/7 Code status discussed by Dr. Lake Bells, pt confirms DNR]  Labs   CBC: Recent Labs  Lab 03/08/21 1308  WBC 1.7*  NEUTROABS 0.6*  HGB 9.9*  HCT 29.8*  MCV 110.4*  PLT 630    Basic Metabolic Panel: Recent Labs  Lab 03/08/21 1308  NA 143  K 4.2  CL 105  CO2 25  GLUCOSE 218*  BUN 44*  CREATININE 1.30*  CALCIUM 8.9   GFR: Estimated Creatinine Clearance: 53.7 mL/min (A) (by C-G formula based on SCr of 1.3 mg/dL (H)). Recent Labs  Lab 03/08/21 1308  WBC 1.7*    Liver Function Tests: Recent Labs  Lab 03/08/21 1308  AST 138*  ALT 184*  ALKPHOS 132*  BILITOT 1.0  PROT 5.9*  ALBUMIN 2.7*   No results for input(s): LIPASE, AMYLASE  in the last 168 hours. No results for input(s): AMMONIA in the last 168 hours.  ABG No results found for: PHART, PCO2ART, PO2ART, HCO3, TCO2, ACIDBASEDEF, O2SAT   Coagulation Profile: No results for input(s): INR, PROTIME in the last 168 hours.  Cardiac Enzymes: No results for input(s): CKTOTAL, CKMB, CKMBINDEX, TROPONINI in the last 168 hours.  HbA1C: Hgb A1c MFr Bld  Date/Time Value Ref Range Status  02/20/2021 11:23 AM 6.3 4.6 - 6.5 % Final    Comment:    Glycemic Control Guidelines for People with Diabetes:Non Diabetic:   <6%Goal of Therapy: <7%Additional Action Suggested:  >8%   08/22/2020 08:38 AM 6.6 (H) 4.6 - 6.5 % Final    Comment:    Glycemic Control Guidelines for People with Diabetes:Non Diabetic:  <6%Goal of Therapy: <7%Additional Action Suggested:  >8%     CBG: No results for input(s): GLUCAP in the last 168 hours.  Review of Systems:   Review of Systems  Constitutional:  Positive for malaise/fatigue.  Respiratory:  Positive for shortness of breath. Negative for cough, hemoptysis and sputum production.   Cardiovascular: Negative.   Gastrointestinal: Negative.   Musculoskeletal: Negative.     Past Medical History:  He,  has a past medical history of Chicken pox, DM2 (diabetes mellitus, type 2) ([place]), History of kidney stones, Hypertension, Kidney stones, lung ca (10/2019), Measles, Mumps, and Stroke ([place]).   Surgical History:   Past Surgical History:  Procedure Laterality Date   COLONOSCOPY W/ POLYPECTOMY     IR IMAGING GUIDED PORT INSERTION  12/12/2020   IR THORACENTESIS ASP PLEURAL SPACE W/IMG GUIDE  02/03/2021   THORACENTESIS N/A 02/16/2021   Procedure: Mathews Robinsons;  Surgeon: Candee Furbish, MD;  [facility] ENDOSCOPY;  Service: Pulmonary;  Laterality: N/A;   VIDEO BRONCHOSCOPY WITH ENDOBRONCHIAL ULTRASOUND N/A 09/30/2019   Procedure: VIDEO BRONCHOSCOPY WITH ENDOBRONCHIAL ULTRASOUND;  Surgeon: Collene Gobble, MD;  Location: [facility] OR;  Service: Thoracic;  Laterality: N/A;   WISDOM TOOTH EXTRACTION  1976-77     Social History:   reports that he has never smoked. He has never used smokeless tobacco. He reports that he does not drink alcohol and does not use drugs.   Family History:  His family history includes Alcohol abuse (age of onset: 62) in his father; Anxiety disorder in his daughter; Arthritis in his maternal grandmother; Dementia in his paternal grandmother; Healthy in his son; Heart attack in his paternal aunt; Heart disease in his father; Hypertension in his father and paternal  grandmother; Lung disease in his father; Other in his maternal aunt; Stomach cancer (age of onset: 76) in his mother; Stroke in his paternal grandmother; Sudden death in his maternal grandfather and paternal grandfather.   Allergies Allergies  Allergen Reactions   Bee Venom Swelling    Lip swelling     Home Medications  Prior to Admission medications   Medication Sig Start Date End Date Taking? Authorizing Provider  albuterol (VENTOLIN HFA) 108 (90 Base) MCG/ACT inhaler Inhale 2 puffs into the lungs every 6 (six) hours as needed for wheezing or shortness of breath. 07/20/20   Tanner, Lyndon Code., PA-C  apixaban (ELIQUIS) 5 MG TABS tablet Take 1 tablet (5 mg total) by mouth 2 (two) times daily. 04/07/20   Camnitz, Will Hassell Done, MD  atorvastatin (LIPITOR) 40 MG tablet TAKE ONE TABLET BY MOUTH ONE TIME DAILY 6PM 10/18/20   Copland, Gay Filler, MD  cephALEXin (KEFLEX) 500 MG capsule Take 1 capsule (500 mg total) by mouth  3 (three) times daily. 02/20/21   Copland, Gay Filler, MD  diltiazem (CARDIZEM CD) 180 MG 24 hr capsule Take 1 capsule (180 mg total) by mouth daily. appt needed for refill [phone] 03/02/21   Fenton, Clint R, PA  diphenhydrAMINE HCl, Sleep, (ZZZQUIL) 25 MG CAPS Take 25 mg by mouth at bedtime.    [provider]  HYDROcodone bit-homatropine (HYCODAN) 5-1.5 MG/5ML syrup Take 5 mLs by mouth every 6 (six) hours as needed for cough. Patient not taking: Reported on 03/01/2021 02/10/21   Heilingoetter, Cassandra L, PA-C  lidocaine-prilocaine (EMLA) cream Apply to the Port-A-Cath site 30-60-minute before treatment 12/07/20   Curt Bears, MD  MELATONIN PO Take 5 mg by mouth at bedtime as needed (sleep).    [provider]  metFORMIN (GLUCOPHAGE) 500 MG tablet TAKE TWO TABLETS BY MOUTH TWICE A DAY WITH MEAL(S) Patient taking differently: Take 500 mg by mouth 2 (two) times daily. TAKE 1 TABLETS BY MOUTH TWICE A DAY WITH MEAL(S) 11/06/20   Copland, Gay Filler, MD  Omega-3 Fatty Acids  (FISH OIL) 1200 MG CAPS Take 1,200 mg by mouth in the morning.    [provider]  prochlorperazine (COMPAZINE) 10 MG tablet Take 1 tablet (10 mg total) by mouth every 6 (six) hours as needed. Patient not taking: Reported on 03/01/2021 10/26/20   Heilingoetter, Tobe Sos, PA-C     Critical care time: n/a      Otilio Carpen Tiki Tucciarone, PA-C [facility] See Amion for pager If no response to pager , please call [phone] until 7pm After 7:00 pm call Elink  [phone]

## 2021-03-08 NOTE — Telephone Encounter (Signed)
Note   Message received from [place] at [facility] -   I was informed today by [facility] that they  attempted to see him today and patient refused stating  that he does not need services.

## 2021-03-08 NOTE — Progress Notes (Signed)
Called to infusion at 1407 for patient feeling short of breath and hypoxia.  Patient has a history of stage IV non-small cell lung cancer, currently on gemcitabine therapy.  While having vitals checked prior to treatment today he was found to be hypoxic to 87% on room air.  Patient was able to take a few deep breaths and get to 90% on room air however dropped back into the 80s immediately after.  He was placed on 2 L nasal cannula.  Patient does not wear oxygen at baseline, this is a new requirement.  Heart rate was ranging from 85-1 53, irregular rhythm.  He admits to me that he has been feeling short of breath for the last several months however it became worse today.  He does have a history of A. fib and is on Eliquis.  Exam shows increased work of breathing and decreased right lung sounds posterior base.  Oxygen saturation is in the mid 90s on 2 L.  Plan was to hold treatment today anyway as patient was neutropenic.  Labs also showed an abnormal elevation of liver enzymes which is new.  EKG shows A. fib.  Because of patient's hypoxia he was transported to the ED by myself and nurse tech. Oncologist agrees with plan.  Report was given to ED physicians. Patient stable at time of transport.

## 2021-03-08 NOTE — Procedures (Signed)
[facility] PCCM Chest tube manipulation  Indication: pneumothorax  Procedure: The patient's pleurX catheter dressing was taken down and an adapter was applied to the pleurX tube.  Then a pigtail to pleur-e-vac adapter was connected and the patient's chest drainage catheter was placed to -20cm suction.  400cc clear yellow fluid was removed and then a significant rush of air came back with the patient reporting relief in his dyspnea and chest discomfort.  The adapter tube was taped into place and a CXR was ordered.  Roselie Awkward, MD [place] [facility] Pager: [phone] Cell: [phone] After 7:00 pm call Elink  [phone]

## 2021-03-08 NOTE — ED Triage Notes (Signed)
Pt arrived from [facility], states was being seen for regular appt, was found to be hypoxic 86% RA by staff. Placed on 2L Foley. 90% RA in triage. 98% on 2L. Lung CA pt, states worsening SOB today. Denies any chest pain

## 2021-03-08 NOTE — ED Provider Notes (Signed)
Emergency Medicine Provider Triage Evaluation Note  Kalil Woessner , a 70 y.o. male  was evaluated in triage.  Pt complains of sob that is chronic but worsened today. Is on eliquis. Ble swelling started months ago.  Review of Systems  Positive: Sob, ble swelling Negative: Chest pain  Physical Exam  BP 122/76   Pulse 99   Temp 98 F (36.7 C) (Oral)   Resp 20   SpO2 97%  Gen:   Awake, no distress   Resp:  Normal effort  MSK:   Moves extremities without difficulty  Other:  Ble edema, conversationally dyspneic  Medical Decision Making  Medically screening exam initiated at 2:54 PM.  Appropriate orders placed.  Sheffield Slider was informed that the remainder of the evaluation will be completed by another provider, this initial triage assessment does not replace that evaluation, and the importance of remaining in the ED until their evaluation is complete.     Rodney Booze, PA-C 03/08/21 1455    Sherwood Gambler, MD 03/09/21 [phone]

## 2021-03-08 NOTE — ED Provider Notes (Signed)
[facility] DEPT Provider Note   CSN: [id number] Arrival date & time: 03/08/21  1430     History Chief Complaint  Patient presents with   Shortness of Breath    Micheal Oliver is a 70 y.o. male.  HPI 70 year old male presents with acute on chronic dyspnea. He is now barely able to walk across a room without getting short of breath. No new cough. No chest pain. Went to oncology appointment and sats were 88% so he was brought down to the ER. Has chronic leg swelling that's not improving.   After initial triage his CXR shows large pneumothorax. He has an indwelling catheter and states he was previously getting large amounts of fluid out with daily drainage. Over past 3 days only minimal amounts have come out.   Past Medical History:  Diagnosis Date   Chicken pox    DM2 (diabetes mellitus, type 2) ([place])    History of kidney stones    passed several    Hypertension    Kidney stones    5-6 times   lung ca 10/2019   Measles    Mumps    Stroke ([place])    x 2, last one was 09/18/2019- has some expressive aphasia    Patient Active Problem List   Diagnosis Date Noted   Neutropenia ([place]) 01/25/2021   Pleural effusion 01/18/2021   Pressure ulcer 01/04/2021   Chemotherapy induced neutropenia ([place]) 11/02/2020   Paroxysmal atrial fibrillation ([place]) 04/19/2020   Secondary hypercoagulable state ([place]) 04/19/2020   Thyroid nodule 11/26/2019   Oral thrush 11/10/2019   Cough 11/10/2019   Encounter for antineoplastic chemotherapy 10/28/2019   Encounter for antineoplastic immunotherapy 10/28/2019   Goals of care, counseling/discussion 10/15/2019   Malignant neoplasm of right upper lobe of lung ([place]) 10/11/2019   Mediastinal lymphadenopathy 09/30/2019   Mass of right lung 09/24/2019   Stroke ([place]) 09/18/2019   Hypertension associated with diabetes ([place]) 09/18/2019   Hyperlipidemia associated with type 2 diabetes mellitus ([place]) 09/18/2019   Eyelid twitch  09/28/2018   Controlled type 2 diabetes mellitus without complication, without long-term current use of insulin ([place]) 07/12/2016   Hyperlipidemia 01/10/2016   Family history of early CAD 07/11/2015   Essential hypertension, benign 01/29/2014   Erectile dysfunction 10/16/2013    Past Surgical History:  Procedure Laterality Date   COLONOSCOPY W/ POLYPECTOMY     IR IMAGING GUIDED PORT INSERTION  12/12/2020   IR THORACENTESIS ASP PLEURAL SPACE W/IMG GUIDE  02/03/2021   THORACENTESIS N/A 02/16/2021   Procedure: Mathews Robinsons;  Surgeon: Candee Furbish, MD;  Location: [facility] ENDOSCOPY;  Service: Pulmonary;  Laterality: N/A;   VIDEO BRONCHOSCOPY WITH ENDOBRONCHIAL ULTRASOUND N/A 09/30/2019   Procedure: VIDEO BRONCHOSCOPY WITH ENDOBRONCHIAL ULTRASOUND;  Surgeon: Collene Gobble, MD;  Location: [facility] OR;  Service: Thoracic;  Laterality: N/A;   WISDOM TOOTH EXTRACTION  1976-77       Family History  Problem Relation Age of Onset   Alcohol abuse Father 57       Deceased   Heart disease Father    Lung disease Father    Hypertension Father    Stomach cancer Mother 98       Deceased   Hypertension Paternal Grandmother    Dementia Paternal Grandmother    Stroke Paternal Grandmother    Arthritis Maternal Grandmother    Sudden death Paternal Grandfather    Sudden death Maternal Grandfather    Heart attack Paternal Aunt    Other  Maternal Aunt        Natural Causes   Healthy Son    Anxiety disorder Daughter     Social History   Tobacco Use   Smoking status: Never   Smokeless tobacco: Never  Vaping Use   Vaping Use: Never used  Substance Use Topics   Alcohol use: No   Drug use: No    Home Medications Prior to Admission medications   Medication Sig Start Date End Date Taking? Authorizing Provider  albuterol (VENTOLIN HFA) 108 (90 Base) MCG/ACT inhaler Inhale 2 puffs into the lungs every 6 (six) hours as needed for wheezing or shortness of breath. 07/20/20   Tanner, Lyndon Code., PA-C  apixaban  (ELIQUIS) 5 MG TABS tablet Take 1 tablet (5 mg total) by mouth 2 (two) times daily. 04/07/20   Camnitz, Will Hassell Done, MD  atorvastatin (LIPITOR) 40 MG tablet TAKE ONE TABLET BY MOUTH ONE TIME DAILY 6PM 10/18/20   Copland, Gay Filler, MD  cephALEXin (KEFLEX) 500 MG capsule Take 1 capsule (500 mg total) by mouth 3 (three) times daily. 02/20/21   Copland, Gay Filler, MD  diltiazem (CARDIZEM CD) 180 MG 24 hr capsule Take 1 capsule (180 mg total) by mouth daily. appt needed for refill [phone] 03/02/21   Fenton, Clint R, PA  diphenhydrAMINE HCl, Sleep, (ZZZQUIL) 25 MG CAPS Take 25 mg by mouth at bedtime.    [provider]  HYDROcodone bit-homatropine (HYCODAN) 5-1.5 MG/5ML syrup Take 5 mLs by mouth every 6 (six) hours as needed for cough. Patient not taking: Reported on 03/01/2021 02/10/21   Heilingoetter, Cassandra L, PA-C  lidocaine-prilocaine (EMLA) cream Apply to the Port-A-Cath site 30-60-minute before treatment 12/07/20   Curt Bears, MD  MELATONIN PO Take 5 mg by mouth at bedtime as needed (sleep).    [provider]  metFORMIN (GLUCOPHAGE) 500 MG tablet TAKE TWO TABLETS BY MOUTH TWICE A DAY WITH MEAL(S) Patient taking differently: Take 500 mg by mouth 2 (two) times daily. TAKE 1 TABLETS BY MOUTH TWICE A DAY WITH MEAL(S) 11/06/20   Copland, Gay Filler, MD  Omega-3 Fatty Acids (FISH OIL) 1200 MG CAPS Take 1,200 mg by mouth in the morning.    [provider]  prochlorperazine (COMPAZINE) 10 MG tablet Take 1 tablet (10 mg total) by mouth every 6 (six) hours as needed. Patient not taking: Reported on 03/01/2021 10/26/20   Heilingoetter, Cassandra L, PA-C    Allergies    Bee venom  Review of Systems   Review of Systems  Constitutional:  Negative for fever.  Respiratory:  Positive for shortness of breath.   Cardiovascular:  Positive for leg swelling. Negative for chest pain.  All other systems reviewed and are negative.  Physical Exam Updated Vital Signs BP 120/88    Pulse 94   Temp 98 F (36.7 C) (Oral)   Resp 20   SpO2 100%   Physical Exam Vitals and nursing note reviewed.  Constitutional:      General: He is not in acute distress.    Appearance: He is well-developed. He is not diaphoretic.  HENT:     Head: Normocephalic and atraumatic.     Right Ear: External ear normal.     Left Ear: External ear normal.     Nose: Nose normal.  Eyes:     General:        Right eye: No discharge.        Left eye: No discharge.  Cardiovascular:     Rate  and Rhythm: Regular rhythm. Tachycardia present.     Heart sounds: Normal heart sounds.  Pulmonary:     Effort: Pulmonary effort is normal. Tachypnea present. No accessory muscle usage.     Breath sounds: Examination of the right-upper field reveals decreased breath sounds. Examination of the right-middle field reveals decreased breath sounds. Examination of the right-lower field reveals decreased breath sounds. Decreased breath sounds present.  Abdominal:     Palpations: Abdomen is soft.     Tenderness: There is no abdominal tenderness.  Musculoskeletal:     Cervical back: Neck supple.     Right lower leg: Edema present.     Left lower leg: Edema present.  Skin:    General: Skin is warm and dry.  Neurological:     Mental Status: He is alert.  Psychiatric:        Mood and Affect: Mood is not anxious.    ED Results / Procedures / Treatments   Labs (all labs ordered are listed, but only abnormal results are displayed) Labs Reviewed  RESP PANEL BY RT-PCR (FLU A&B, COVID) [id number]  BASIC METABOLIC PANEL  CBC  BRAIN NATRIURETIC PEPTIDE  TROPONIN I (HIGH SENSITIVITY)  TROPONIN I (HIGH SENSITIVITY)    EKG None  Radiology DG Chest 2 View  Result Date: 03/08/2021 CLINICAL DATA:  Shortness of breath, worsening today EXAM: CHEST - 2 VIEW COMPARISON:  Chest radiograph 02/16/2021 FINDINGS: The right chest wall port is stable. The cardiomediastinal silhouette is stable. There is a large right  pneumothorax. There is no definite evidence of leftward midline shift. There is vascular congestion and possible mild pulmonary interstitial edema in the left lung. There is a trace left pleural effusion. There is no left pneumothorax There is no acute osseous abnormality. IMPRESSION: 1. Large right pneumothorax. No definite evidence of leftward mediastinal shift at this time. 2. Trace left pleural effusion with vascular congestion and possible mild pulmonary interstitial edema. These results were called by telephone at the time of interpretation on 03/08/2021 at 3:28 pm to provider Dr Meschick, who verbally acknowledged these results. Electronically Signed   By: Valetta Mole M.D.   On: 03/08/2021 15:29   ECHOCARDIOGRAM COMPLETE  Result Date: 03/07/2021    ECHOCARDIOGRAM REPORT   Patient Name:   KENDAN CORNFORTH Date of Exam: 03/07/2021 Medical Rec #:  [id number]       Height:       74.0 in Accession #:    [id number]      Weight:       154.7 lb Date of Birth:  1950-06-10       BSA:          1.949 m Patient Age:    28 years        BP:           112/84 mmHg Patient Gender: M               HR:           103 bpm. Exam Location:  Outpatient Procedure: 2D Echo, 3D Echo, Cardiac Doppler, Color Doppler and Strain Analysis Indications:    Z51.11 Encounter for antineoplastic chemotheraphy  History:        Patient has prior history of Echocardiogram examinations, most                 recent 09/19/2019. Abnormal ECG, Stroke, Arrythmias:Atrial                 Fibrillation; Risk Factors:Hypertension and  Diabetes. Cancer.  Sonographer:    Roseanna Rainbow RDCS Referring Phys: [phone] MATTHEW R HUNSUCKER  Sonographer Comments: Technically difficult study due to poor echo windows. Global longitudinal strain was attempted. Patient could not lie down. Patient very short of breath. Patient states he has a catheter that he drains the pleural effusion on a daily basis IMPRESSIONS  1. Left ventricular ejection fraction, by estimation, is 40 to  45%. The left ventricle has mildly decreased function. The left ventricle demonstrates global hypokinesis. There is moderate left ventricular hypertrophy. Left ventricular diastolic parameters are indeterminate as in Afib during study  2. Right ventricular systolic function is mildly reduced. The right ventricular size is normal. Mildly increased right ventricular wall thickness. There is moderately elevated pulmonary artery systolic pressure. The estimated right ventricular systolic pressure is 17.6 mmHg.  3. A small to moderate pericardial effusion is present.  4. There is a moderate pleural effusion in the left lateral region.  5. The mitral valve is normal in structure. Trivial mitral valve regurgitation. No evidence of mitral stenosis.  6. The aortic valve is tricuspid. Aortic valve regurgitation is mild to moderate. Aortic valve sclerosis is present, with no evidence of aortic valve stenosis.  7. The inferior vena cava is dilated in size with <50% respiratory variability, suggesting right atrial pressure of 15 mmHg. FINDINGS  Left Ventricle: Left ventricular ejection fraction, by estimation, is 40 to 45%. The left ventricle has mildly decreased function. The left ventricle demonstrates global hypokinesis. 3D left ventricular ejection fraction analysis performed but not reported based on interpreter judgement due to suboptimal tracking. The left ventricular internal cavity size was normal in size. There is moderate left ventricular hypertrophy. Left ventricular diastolic parameters are indeterminate. Right Ventricle: The right ventricular size is normal. Mildly increased right ventricular wall thickness. Right ventricular systolic function is mildly reduced. There is moderately elevated pulmonary artery systolic pressure. The tricuspid regurgitant velocity is 2.99 m/s, and with an assumed right atrial pressure of 15 mmHg, the estimated right ventricular systolic pressure is 16.0 mmHg. Left Atrium: Left atrial  size was normal in size. Right Atrium: Right atrial size was normal in size. Pericardium: A small pericardial effusion is present. Mitral Valve: The mitral valve is normal in structure. Trivial mitral valve regurgitation. No evidence of mitral valve stenosis. Tricuspid Valve: The tricuspid valve is normal in structure. Tricuspid valve regurgitation is mild. Aortic Valve: The aortic valve is tricuspid. Aortic valve regurgitation is mild to moderate. Aortic valve sclerosis is present, with no evidence of aortic valve stenosis. Pulmonic Valve: The pulmonic valve was not well visualized. Pulmonic valve regurgitation is not visualized. Aorta: The aortic root is normal in size and structure. Venous: The inferior vena cava is dilated in size with less than 50% respiratory variability, suggesting right atrial pressure of 15 mmHg. IAS/Shunts: No atrial level shunt detected by color flow Doppler. Additional Comments: There is a moderate pleural effusion in the left lateral region.  LEFT VENTRICLE PLAX 2D LVIDd:         4.05 cm LVIDs:         3.30 cm LV PW:         1.35 cm LV IVS:        1.30 cm LVOT diam:     2.40 cm     3D Volume EF: LV SV:         62          3D EF:        50 %  LV SV Index:   32          LV EDV:       127 ml LVOT Area:     4.52 cm    LV ESV:       63 ml                            LV SV:        64 ml  LV Volumes (MOD) LV vol d, MOD A2C: 98.1 ml LV vol d, MOD A4C: 73.2 ml LV vol s, MOD A2C: 62.1 ml LV vol s, MOD A4C: 37.2 ml LV SV MOD A2C:     36.0 ml LV SV MOD A4C:     73.2 ml LV SV MOD BP:      32.8 ml RIGHT VENTRICLE            IVC RV S prime:     5.77 cm/s  IVC diam: 2.20 cm TAPSE (M-mode): 1.2 cm LEFT ATRIUM             Index        RIGHT ATRIUM           Index LA diam:        3.30 cm 1.69 cm/m   RA Area:     17.00 cm LA Vol (A2C):   78.6 ml 40.33 ml/m  RA Volume:   46.50 ml  23.86 ml/m LA Vol (A4C):   33.7 ml 17.29 ml/m LA Biplane Vol: 54.3 ml 27.86 ml/m  AORTIC VALVE LVOT Vmax:   87.80 cm/s LVOT  Vmean:  58.500 cm/s LVOT VTI:    0.137 m  AORTA Ao Root diam: 3.60 cm MITRAL VALVE               TRICUSPID VALVE MV Area (PHT): 2.88 cm    TV Peak grad:   37.9 mmHg MV Decel Time: 263 msec    TV Vmax:        3.08 m/s MV E velocity: 90.85 cm/s  TR Peak grad:   35.8 mmHg                            TR Vmax:        299.00 cm/s                             SHUNTS                            Systemic VTI:  0.14 m                            Systemic Diam: 2.40 cm Oswaldo Milian MD Electronically signed by Oswaldo Milian MD Signature Date/Time: 03/07/2021/11:04:54 PM    Final     Procedures .Critical Care Performed by: Sherwood Gambler, MD Authorized by: Sherwood Gambler, MD   Critical care provider statement:    Critical care time (minutes):  30   Critical care time was exclusive of:  Separately billable procedures and treating other patients   Critical care was necessary to treat or prevent imminent or life-threatening deterioration of the following conditions:  Respiratory failure   Critical care was time spent personally by me on the following activities:  Development of treatment  plan with patient or surrogate, discussions with consultants, evaluation of patient's response to treatment, examination of patient, ordering and review of laboratory studies, ordering and review of radiographic studies, ordering and performing treatments and interventions, pulse oximetry, re-evaluation of patient's condition and review of old charts   Medications Ordered in ED Medications  lidocaine-EPINEPHrine (XYLOCAINE W/EPI) 2 %-1:200000 (PF) injection 10 mL (10 mLs Infiltration Not Given 03/08/21 1625)    ED Course  I have reviewed the triage vital signs and the nursing notes.  Pertinent labs & imaging results that were available during my care of the patient were reviewed by me and considered in my medical decision making (see chart for details).    MDM Rules/Calculators/A&P                            Initial plan was to place a pigtail catheter. I briefly discussed with pulmonology given their familiarity with him but then we found that he has a pleural catheter. They were able to bring an attachment to connect to the Pleur-evac.  Pulmonology was able to hook this up and drained significant amount of fluid and air.  Patient is now coughing more.  We will get repeat chest x-ray to confirm reduction in size of pneumothorax.  Pulmonology also asked for CT without contrast of the chest.  Pulmonology will admit. Final Clinical Impression(s) / ED Diagnoses Final diagnoses:  Pneumothorax on right    Rx / DC Orders ED Discharge Orders     None        Sherwood Gambler, MD 03/08/21 1645

## 2021-03-09 ENCOUNTER — Inpatient Hospital Stay (HOSPITAL_COMMUNITY): Payer: Medicare HMO

## 2021-03-09 DIAGNOSIS — L8990 Pressure ulcer of unspecified site, unspecified stage: Secondary | ICD-10-CM | POA: Insufficient documentation

## 2021-03-09 DIAGNOSIS — J9383 Other pneumothorax: Secondary | ICD-10-CM | POA: Diagnosis not present

## 2021-03-09 LAB — HIV ANTIBODY (ROUTINE TESTING W REFLEX): HIV Screen 4th Generation wRfx: NONREACTIVE

## 2021-03-09 LAB — MRSA NEXT GEN BY PCR, NASAL: MRSA by PCR Next Gen: NOT DETECTED

## 2021-03-09 LAB — GLUCOSE, CAPILLARY
Glucose-Capillary: 115 mg/dL — ABNORMAL HIGH (ref 70–99)
Glucose-Capillary: 237 mg/dL — ABNORMAL HIGH (ref 70–99)
Glucose-Capillary: 222 mg/dL — ABNORMAL HIGH (ref 70–99)

## 2021-03-09 LAB — BASIC METABOLIC PANEL
Anion gap: 5 (ref 5–15)
BUN: 44 mg/dL — ABNORMAL HIGH (ref 8–23)
CO2: 29 mmol/L (ref 22–32)
Calcium: 8.7 mg/dL — ABNORMAL LOW (ref 8.9–10.3)
Chloride: 105 mmol/L (ref 98–111)
Creatinine, Ser: 1.13 mg/dL (ref 0.61–1.24)
GFR, Estimated: >60 mL/min (ref >60)
Glucose, Bld: 122 mg/dL — ABNORMAL HIGH (ref 70–99)
Potassium: 4.7 mmol/L (ref 3.5–5.1)
Sodium: 139 mmol/L (ref 135–145)

## 2021-03-09 LAB — MAGNESIUM: Magnesium: 1.9 mg/dL (ref 1.7–2.4)

## 2021-03-09 MED ORDER — LORAZEPAM 2 MG/ML IJ SOLN
1.0000 mg | INTRAMUSCULAR | Status: DC | PRN
  Administered 2021-03-09: 1 mg via INTRAVENOUS
  Filled 2021-03-09: qty 1

## 2021-03-09 MED ORDER — AMIODARONE LOAD VIA INFUSION
150.0000 mg | Freq: Once | INTRAVENOUS | Status: AC
  Administered 2021-03-09: 150 mg via INTRAVENOUS
  Filled 2021-03-09: qty 83.34

## 2021-03-09 MED ORDER — AMIODARONE HCL 200 MG PO TABS
200.0000 mg | ORAL_TABLET | Freq: Every day | ORAL | Status: DC
  Administered 2021-03-09 – 2021-03-10 (×2): 200 mg via ORAL
  Filled 2021-03-09 (×2): qty 1

## 2021-03-09 MED ORDER — MAGNESIUM SULFATE 2 GM/50ML IV SOLN
2.0000 g | Freq: Once | INTRAVENOUS | Status: AC
  Administered 2021-03-09: 2 g via INTRAVENOUS
  Filled 2021-03-09: qty 50

## 2021-03-09 MED ORDER — MORPHINE SULFATE (PF) 2 MG/ML IV SOLN
1.0000 mg | INTRAVENOUS | Status: DC | PRN
Start: 2021-03-09 — End: 2021-03-10
  Administered 2021-03-10: 2 mg via INTRAVENOUS
  Filled 2021-03-09: qty 1

## 2021-03-09 MED ORDER — HYDROCODONE-ACETAMINOPHEN 10-325 MG PO TABS
1.0000 | ORAL_TABLET | Freq: Four times a day (QID) | ORAL | Status: DC | PRN
Start: 2021-03-09 — End: 2021-03-10
  Filled 2021-03-09: qty 1

## 2021-03-09 MED ORDER — LORAZEPAM 1 MG PO TABS: 1.0000 mg | ORAL_TABLET | ORAL | Status: DC | PRN

## 2021-03-09 MED ORDER — AMIODARONE HCL IN DEXTROSE 360-4.14 MG/200ML-% IV SOLN
30.0000 mg/h | INTRAVENOUS | Status: DC
  Administered 2021-03-09: 30 mg/h via INTRAVENOUS
  Filled 2021-03-09 (×2): qty 200

## 2021-03-09 MED ORDER — SODIUM CHLORIDE 0.9% FLUSH
10.0000 mL | Freq: Two times a day (BID) | INTRAVENOUS | Status: DC
  Administered 2021-03-09 – 2021-03-10 (×3): 10 mL

## 2021-03-09 MED ORDER — AMIODARONE HCL IN DEXTROSE 360-4.14 MG/200ML-% IV SOLN
60.0000 mg/h | INTRAVENOUS | Status: AC
  Administered 2021-03-09 (×2): 60 mg/h via INTRAVENOUS
  Filled 2021-03-09: qty 200

## 2021-03-09 NOTE — Progress Notes (Signed)
eLink Physician-Brief Progress Note Patient Name: Micheal Oliver DOB: September 28, 1950 MRN: [id number]   Date of Service  03/09/2021  HPI/Events of Note  AFIB with RVR - Ventricular rate = 120-140's. BP = 132/92. Patient is already on Cardizem CD and Eliquis.   eICU Interventions  Plan: Amiodarone IV load and bolus. BMP and Mg++ level STAT.     Intervention Category Major Interventions: Arrhythmia - evaluation and management  Barnell Shieh Eugene 03/09/2021, 12:41 AM

## 2021-03-09 NOTE — Progress Notes (Signed)
[facility] PCCM  Clamped chest tube to see if we could have the option of sending him home and draining with his pleurX catheter.  The tube was clamped for about 2.5 hours then he developed symptoms and had to have his tube put back to suction.  Will order CXR.  Will not be able to d/c home until we have a safe way to send him out while managing his bronchopleurofistula.   Roselie Awkward, MD [place] P[facility]M Pager: [phone] Cell: [phone] After 7:00 pm call Elink  [phone]

## 2021-03-09 NOTE — Progress Notes (Signed)
SATURATION QUALIFICATIONS: (This note is used to comply with regulatory documentation for home oxygen)  Patient Saturations on Room Air at Rest = 99%  Patient Saturations on Room Air while Ambulating = 85%  Patient Saturations on 3 Liters of oxygen while Ambulating = 92%

## 2021-03-09 NOTE — Progress Notes (Signed)
Pt reported feeling congestion in his lungs while his chest tube was clamped. Per protocol, this RN switched patient back to suction per orders. Dr Lake Bells notified by this RN; STAT CXR orders obtained. This RN will continue to carefully monitor pt.

## 2021-03-09 NOTE — Progress Notes (Signed)
Patient is again voicing his desire to leave hospital. He said "I may be spending 15% of the time I have left if I stay tonight'. Critical care NP notified by this RN, and NP rounded at bedside. Pt reports his biggest complaint to staying overnight are the interruptions to sleep caused by the tele monitor, IV pump and staff rounds. Critical care NP told pt she could discontinue IV gtts, and tele monitoring, and obtain vitals once at the beginning of the shift. The barrier to discharge is the patient's hospice application pending. Patient has been thoroughly educated by this RN and NP that if he was to leave AMA before hospice services have been approved, he would have no PRN pain medication or someone to troubleshoot his chest tube drain if he becomes symptomatic at home. Pt is discussing his options with his family at this time; NP is discontinuing as many medical interventions as possible at this time to make patient more comfortable.

## 2021-03-09 NOTE — Progress Notes (Signed)
[facility] P[facility]M  Attempting to place patient on water seal If he can tolerate water seal then could d/c home with a mini-express device in place  Roselie Awkward, MD [place] P[facility]M Pager: [phone] Cell: [phone] After 7:00 pm call Elink  [phone]

## 2021-03-09 NOTE — Progress Notes (Signed)
Patient's Chest tube was clamped by Dr Lake Bells at [phone]. Pt was instructed to notify RN immediately if he becomes more SOB or experiences other symptoms. This RN will continue to carefully monitor pt.

## 2021-03-09 NOTE — Progress Notes (Signed)
[facility] ADULT ICU REPLACEMENT PROTOCOL   The patient does apply for the [facility] Adult ICU Electrolyte Replacment Protocol based on the criteria listed below:   1.Exclusion criteria: TCTS patients, ECMO patients, and Dialysis patients 2. Is GFR >/= 30 ml/min? Yes.    Patient's GFR today is >60 3. Is SCr </= 2? Yes.   Patient's SCr is 1.13 mg/dL 4. Did SCr increase >/= 0.5 in 24 hours? No. 5.Pt's weight >40kg  Yes.   6. Abnormal electrolyte(s):  Mg 1.9  7. Electrolytes replaced per protocol 8.  Call MD STAT for K+ </= 2.5, Phos </= 1, or Mag </= 1 Physician:  S. Rosie Fate R Johnsie Moscoso 03/09/2021 1:37 AM

## 2021-03-09 NOTE — Progress Notes (Signed)
NAME:  Micheal Oliver, MRN:  [id number], DOB:  1950/06/23, LOS: 1 ADMISSION DATE:  03/08/2021, CONSULTATION DATE:  03/09/21 REFERRING MD:  Regenia Skeeter, CHIEF COMPLAINT:  hypoxia   History of Present Illness:  Micheal Oliver is a 70 y/o M with PMH significant for stage IV non-small cell lung Ca on gemcitabine therapy, atrial fibrillation on Eliquis, T2DM, HTN, CVA who presented with acute shortness of breath and hypoxia at his oncology office and was sent to the ED.  CXR showed large R pneumothorax.  Pt was hemodynamically stable on 2L .  He has a Pleurx catheter in place on the R since 11/17. He states over the last 2-3 days there has been no drainage and did hear a sucking gush of air recently when accessed.   Pertinent  Medical History   has a past medical history of Chicken pox, DM2 (diabetes mellitus, type 2) ([place]), History of kidney stones, Hypertension, Kidney stones, lung ca (10/2019), Measles, Mumps, and Stroke ([place]).   Significant Oliver Events: Including procedures, antibiotic start and stop dates in addition to other pertinent events   12/7 admit to ICU with pneumothorax  Interim History / Subjective:   Difficult night Had atrial fibrillation with RVR He really wants to go home He still has an air leak   Objective   Blood pressure 115/86, pulse 94, temperature 98.3 F (36.8 C), temperature source Oral, resp. rate 18, height 6' 2"  (1.88 m), weight 58.6 kg, SpO2 100 %.        Intake/Output Summary (Last 24 hours) at 03/09/2021 0751 Last data filed at 03/09/2021 3785 Gross per 24 hour  Intake 206.28 ml  Output 820 ml  Net -613.72 ml   Filed Weights   03/09/21 0000 03/09/21 0500  Weight: 58.6 kg 58.6 kg    General: Thin, cachectic male, sitting up in chair HENT: NCAT OP clear temporal wasting PULM: Diminished R lung, clear on left, normal effort CV: RRR, no mgr GI: BS+, soft, nontender MSK: diminished bulk and tone Neuro: awake, alert, no distress,  MAEW   Resolved Oliver Problem list     Assessment & Plan:    Acute Right hydropneumothorax due to new bronchopleural fistula in setting of advanced lung cancer.  The family was draining the pleurX appropriately at home so do not believe this was due to an error in their technique or catheter placement. Trapped lung, right He has a persistent air leak on the right, low volume (1 chamber about every 3-4 breath).  He is insisting that he go home.  I met with Darnelle Catalan, and their daughter today and talked about this at length today including going over his chest imaging going back to June.  I explained there are very few good options for him with a persistent air leak.  Talc pleuridesis will have a likelihood of success of less than 20% given his trapped lung and persistent fluid accumulation.  A second chest tube with a heimlich valve will re-occlude with fluid.  I offered waiting in the Oliver for it to heal, but the likelihood of that working is low given his cachexia and he insists that he is leaving.  So after discussion with partners about this unusual and difficult situation we have outlined the following plan: -d/c with home hospice, using morphine for dyspnea -drain the pleurX tube twice a day at home and as needed for chest tightness/worsening dyspnea -anticipate death at home in a matter of days - Micheal Oliver consult  Atrial fibrillation with  RVR D/c home on diltiazem Can stop taking eliquis  Hyperlipidemia DM2 Can stop therapy for this at discharge  Advanced Lung cancer, stage IV, POA Home hospice  Confounding factors: Severe protein calorie malnutrition, POA Severe physical deconditioning POA Failure to thrive POA    Best Practice (right click and "Reselect all SmartList Selections" daily)   Diet/type: Regular consistency (see orders) DVT prophylaxis: DOAC GI prophylaxis: N/A Lines: N/A Foley:  N/A Code Status:  DNR  Last date of multidisciplinary goals of care  discussion [12/8 see above]  Labs   CBC: Recent Labs  Lab 03/08/21 1308 03/08/21 1456  WBC 1.7* 1.8*  NEUTROABS 0.6*  --   HGB 9.9* 9.8*  HCT 29.8* 30.1*  MCV 110.4* 113.2*  PLT 338 300     Basic Metabolic Panel: Recent Labs  Lab 03/08/21 1308 03/08/21 1456 03/09/21 0040  NA 143 138 139  K 4.2 4.5 4.7  CL 105 105 105  CO2 25 27 29   GLUCOSE 218* 136* 122*  BUN 44* 46* 44*  CREATININE 1.30* 1.14 1.13  CALCIUM 8.9 8.8* 8.7*  MG  --   --  1.9    GFR: Estimated Creatinine Clearance: 51.1 mL/min (by C-G formula based on SCr of 1.13 mg/dL). Recent Labs  Lab 03/08/21 1308 03/08/21 1456  WBC 1.7* 1.8*     Liver Function Tests: Recent Labs  Lab 03/08/21 1308  AST 138*  ALT 184*  ALKPHOS 132*  BILITOT 1.0  PROT 5.9*  ALBUMIN 2.7*    No results for input(s): LIPASE, AMYLASE in the last 168 hours. No results for input(s): AMMONIA in the last 168 hours.  ABG No results found for: PHART, PCO2ART, PO2ART, HCO3, TCO2, ACIDBASEDEF, O2SAT   Coagulation Profile: No results for input(s): INR, PROTIME in the last 168 hours.  Cardiac Enzymes: No results for input(s): CKTOTAL, CKMB, CKMBINDEX, TROPONINI in the last 168 hours.  HbA1C: Hgb A1c MFr Bld  Date/Time Value Ref Range Status  02/20/2021 11:23 AM 6.3 4.6 - 6.5 % Final    Comment:    Glycemic Control Guidelines for People with Diabetes:Non Diabetic:  <6%Goal of Therapy: <7%Additional Action Suggested:  >8%   08/22/2020 08:38 AM 6.6 (H) 4.6 - 6.5 % Final    Comment:    Glycemic Control Guidelines for People with Diabetes:Non Diabetic:  <6%Goal of Therapy: <7%Additional Action Suggested:  >8%     CBG: Recent Labs  Lab 03/09/21 0749  GLUCAP 115*       Critical care time: n/a > 45 minutes spent discussing the plan of care with the patient, his family, and multiple team members    Roselie Awkward, MD [place] [facility] Pager: [phone] Cell: [phone] After 7:00 pm call Elink   [phone]

## 2021-03-09 NOTE — Progress Notes (Signed)
[facility]) hospital liaison note.   Notified by Transition of Care Manger of patient/family request for [facility] services at home after discharge. Chart and patient information under review by [facility] physician. Hospice eligibility pending currently.    [facility] will continue to follow this patient and help with any discharge plans once patient is appropriate for discharge. Please feel free to call with any hospice related questions.  Thank you for this referral.  Clementeen Hoof, BSN, [facility] [phone]

## 2021-03-09 NOTE — Progress Notes (Signed)
PCCM Progress Note   Met with patient and family this evening to discuss plan of care and discharge disposition. Mr. Labat is struggling with the decision to remain hospitalized overnight. He has a very strong desire to leave tonight with the plan to pass at home.   I am very worried that if he were to leave AMA tonight he would quickly become severely symptomatic and without Hospice involved his family wound not be able to help keep him comfortable.   He would like to discuss with family and weigh all options   In the meantime I will remove all unnecessary medical equipment, we will limit interruptions overnight and focus on comfort only.   Breshae Belcher D. Kenton Kingfisher, NP-C [facility] Personal contact information can be found on Amion  03/09/2021, 7:10 PM

## 2021-03-09 NOTE — TOC Transition Note (Signed)
Transition of Care [facility]) - CM/SW Discharge Note   Patient Details  Name: Micheal Oliver MRN: [id number] Date of Birth: 02/17/1951  Transition of Care [facility]) CM/SW Contact:  Trish Mage, LCSW Phone Number: 03/09/2021, 2:27 PM   Clinical Narrative:   Patient who is wanting to d/c home ASAP with hospice care via [facility], is in need of O2, referral to hospice, and more drain bottles.  He will be discharged pending ability to clamp chest tube.  Tried earlier and he is back on suction, which [facility] is unable to provide in the home. I made the referral to [facility] rep Daphene Calamity, let RN know how to do SAT note that is needed to order O2 from provider, and spoke with daughter who confirmed need for bottles as drainage output has doubled from previous rate.  Will work with [facility] re: procurement of same. TOC will continue to follow during the course of hospitalization.      Final next level of care: Home w Hospice Care Barriers to Discharge: Continued Medical Work up   Patient Goals and CMS Choice        Discharge Placement                       Discharge Plan and Services                                     Social Determinants of Health (SDOH) Interventions     Readmission Risk Interventions No flowsheet data found.

## 2021-03-09 NOTE — Progress Notes (Signed)
This Pt has voiced his desire to go home with hospice. Dr Lake Bells notified by RN, and spent time discussing the logistics with the patient about going home. Per MD, hospice will be consulted about options with his chest tube air leak. Dr Lake Bells is also consulting surgery about non-invasive ways to trouble shoot tube. This RN will continue to carefully monitor patient and respiratory status.

## 2021-03-10 DIAGNOSIS — J9383 Other pneumothorax: Secondary | ICD-10-CM | POA: Diagnosis not present

## 2021-03-10 MED ORDER — GLYCOPYRROLATE 0.2 MG/ML IJ SOLN: 0.1000 mg | Freq: Once | INTRAMUSCULAR | Status: DC

## 2021-03-10 MED ORDER — LORAZEPAM 1 MG PO TABS: 1.0000 mg | ORAL_TABLET | ORAL | 0 refills | Status: AC | PRN

## 2021-03-10 MED ORDER — MORPHINE SULFATE (CONCENTRATE) 10 MG /0.5 ML PO SOLN: 5.0000 mg | ORAL | 0 refills | Status: AC | PRN

## 2021-03-10 MED ORDER — GLYCOPYRROLATE 1 MG PO TABS: 1.0000 mg | ORAL_TABLET | Freq: Three times a day (TID) | ORAL | 0 refills | Status: AC | PRN

## 2021-03-10 NOTE — Discharge Summary (Signed)
Physician Discharge Summary     Patient ID: Micheal Oliver MRN: [id number] DOB/AGE: 11-01-1950 70 y.o.  Admit date: 03/08/2021 Discharge date: 03/10/2021  Discharge Diagnoses:    Moderate hydropneumothorax with new bronchopleural fistula Stage IV lung cancer Severe protein calorie malnutrition Severe physical deconditioning Failure to thrive A. fib with RVR Hyperlipidemia Diabetes   Discharge summary   Micheal Oliver is a 70 y/o M with PMH significant for stage IV non-small cell lung Ca on gemcitabine therapy, atrial fibrillation on Eliquis, T2DM, HTN, CVA who presented with acute shortness of breath and hypoxia at his oncology office and was sent to the ED.  CXR showed large R pneumothorax.  Pt was hemodynamically stable on 2L [place].  He has a Pleurx catheter in place on the R since 11/17. He states over the last 2-3 days there has been no drainage and did hear a sucking gush of air recently when accessed.   On further assessment during hospitalization it appears patient has a persistent air leak on the right with low volumes indicating possible ex vacuo pneumothorax versus new bronchopleural fistula.  Options were discussed with patient in detail afternoon of 12/8 and decision was made to progress in a comfort approach.  Patient has elected to discharge home with hospice care at this time.  Chest tube drainage system will be exchanged for small mini expressed for easier home care.  Hospice care has been involved and will be delivering necessary home equipment.  Patient and family are aware of plan and agreed to be discharged home today.  Discharge Plan by Active Problems   Acute Right hydropneumothorax due to new bronchopleural fistula in setting of advanced lung cancer.   -The family was draining the pleurX appropriately at home so do not believe this was due to an error in their technique or catheter placement. Trapped lung, right -He has a persistent air leak on the right, low volume (1  chamber about every 3-4 breath).    P: Discharge home wit Hospice As needed opioids and benzodiazepines as needed  Mini express to be contacted prior to discharge  Atrial fibrillation with RVR P: Continue home diltiazem and discharge Stop home   Hyperlipidemia DM2 Medication management upon discharge   Advanced Lung cancer, stage IV, POA Home with hospice   Confounding factors: Severe protein calorie malnutrition, POA Severe physical deconditioning POA Failure to thrive [facility] tests/ studies  CXR Stable right-sided chest tube. Moderate to large right apical and basilar pneumothorax is noted  Procedures   None   Culture data/antimicrobials   COVID swab negative MRSA PCR negative   Consults  None    Discharge Exam: BP 115/73 (BP Location: Left Arm)   Pulse 73   Temp 97.7 F (36.5 C) (Oral)   Resp 17   Ht 6\' 2"  (1.88 m)   Wt 58.6 kg   SpO2 96%   BMI 16.59 kg/m   General: Acute on chronic ill-appearing deconditioned very frail elderly male lying in bed in no acute distress HEENT: [place]/AT, MM pink/moist, PERRL,  Neuro: Alert and oriented x3, nonfocal CV: s1s2 regular rate and rhythm, no murmur, rubs, or gallops,  PULM: Diminished breath sounds bilaterally, rhonchi, Pleurx chest tube connected to mini express GI: soft, bowel sounds active in all 4 quadrants, non-tender, non-distended, tolerating oral diet Extremities: warm/dry, no edema  Skin: no rashes or lesions  Labs at discharge   Lab Results  Component Value Date   CREATININE 1.13 03/09/2021  BUN 44 (H) 03/09/2021   NA 139 03/09/2021   K 4.7 03/09/2021   CL 105 03/09/2021   CO2 29 03/09/2021   Lab Results  Component Value Date   WBC 1.8 (L) 03/08/2021   HGB 9.8 (L) 03/08/2021   HCT 30.1 (L) 03/08/2021   MCV 113.2 (H) 03/08/2021   PLT 300 03/08/2021   Lab Results  Component Value Date   ALT 184 (H) 03/08/2021   AST 138 (H) 03/08/2021   ALKPHOS 132 (H) 03/08/2021    BILITOT 1.0 03/08/2021   Lab Results  Component Value Date   INR 1.2 (H) 09/24/2019   INR 1.1 09/18/2019    Current radiological studies    DG Chest 2 View  Result Date: 03/08/2021 CLINICAL DATA:  Shortness of breath, worsening today EXAM: CHEST - 2 VIEW COMPARISON:  Chest radiograph 02/16/2021 FINDINGS: The right chest wall port is stable. The cardiomediastinal silhouette is stable. There is a large right pneumothorax. There is no definite evidence of leftward midline shift. There is vascular congestion and possible mild pulmonary interstitial edema in the left lung. There is a trace left pleural effusion. There is no left pneumothorax There is no acute osseous abnormality. IMPRESSION: 1. Large right pneumothorax. No definite evidence of leftward mediastinal shift at this time. 2. Trace left pleural effusion with vascular congestion and possible mild pulmonary interstitial edema. These results were called by telephone at the time of interpretation on 03/08/2021 at 3:28 pm to provider Dr Meschick, who verbally acknowledged these results. Electronically Signed   By: Valetta Mole M.D.   On: 03/08/2021 15:29   CT Chest Wo Contrast  Result Date: 03/08/2021 CLINICAL DATA:  Pneumothorax, shortness of breath. EXAM: CT CHEST WITHOUT CONTRAST TECHNIQUE: Multidetector CT imaging of the chest was performed following the standard protocol without IV contrast. COMPARISON:  Chest x-ray 03/08/2021. CT chest abdomen and pelvis 01/24/2021. FINDINGS: Cardiovascular: The heart is mildly enlarged. There is a small pericardial effusion similar to the prior study. Aorta is normal in size. Right-sided chest port catheter tip ends in the right atrium. Mediastinum/Nodes: There is questionable diffuse esophageal wall thickening. No discrete enlarged lymph nodes are identified. The visualized thyroid gland is within normal limits. Lungs/Pleura: There is a large right hydropneumothorax. Right chest tube is present in the lower  right hemithorax. There is no mediastinal shift. Moderate size left pleural effusion appear stable from the prior exam. Right upper lobe paramediastinal mass measures 5.0 x 3.5 cm and is likely similar to the prior study given superimposed atelectasis of the right upper lobe secondary to pneumothorax. Right perihilar soft tissue is grossly unchanged. There is medial right lower lobe atelectasis and airspace disease which is increasing. Numerous bilateral pulmonary nodules are again seen, some of these have increased in size for example left upper lobe measuring 10 mm image 7/38 (previously 7 mm) previously identified cavitary nodules throughout the right lung have increased in number when compared to the prior examination, particularly in the right middle lobe. There is some new ground-glass opacities in the right middle lobe. Trachea and central mainstem bronchi are patent. Upper Abdomen: No acute abnormality. Musculoskeletal: No chest wall mass or suspicious bone lesions identified. IMPRESSION: 1. Large right hydropneumothorax. No mediastinal shift. Right chest tube in place. 2. Right lower lobe airspace opacities have increased from the prior examination which may represent pneumonia or metastatic disease. 3. Right upper lobe paramediastinal mass is grossly unchanged. 4. Bilateral pulmonary metastatic nodules. Mildly increased in number and size. 5.  Stable moderate left pleural effusion. 6. Stable small pericardial effusion. Electronically Signed   By: Ronney Asters M.D.   On: 03/08/2021 18:04   DG CHEST PORT 1 VIEW  Result Date: 03/09/2021 CLINICAL DATA:  Congestion. EXAM: PORTABLE CHEST 1 VIEW COMPARISON:  Same day. FINDINGS: Stable cardiomediastinal silhouette. Right internal jugular Port-A-Cath is unchanged. Mild left pleural effusion is noted. Right basilar chest tube is noted. Moderate to large right apical and basilar pneumothorax is noted. Bony thorax is unremarkable. IMPRESSION: Stable right-sided  chest tube. Moderate to large right apical and basilar pneumothorax is noted Electronically Signed   By: Marijo Conception M.D.   On: 03/09/2021 14:23   DG CHEST PORT 1 VIEW  Result Date: 03/09/2021 CLINICAL DATA:  Follow-up right pneumothorax EXAM: PORTABLE CHEST 1 VIEW COMPARISON:  03/08/2021 FINDINGS: Right Port-A-Cath and right basilar PleurX catheter remain in place, unchanged. Moderate to large right pneumothorax again noted, slight improvement at the right lung base. Bilateral pleural effusions are stable. Heart is borderline in size. Diffuse pulmonary nodules again noted as seen on CT. IMPRESSION: Moderate to large right pneumothorax with slight improvement, particularly at the right lung base. Bilateral pleural effusions, stable. Stable bilateral nodular densities throughout the lungs. Electronically Signed   By: Rolm Baptise M.D.   On: 03/09/2021 08:57   DG Chest Portable 1 View  Result Date: 03/08/2021 CLINICAL DATA:  Chest tube EXAM: PORTABLE CHEST 1 VIEW COMPARISON:  03/08/2021, 03/18/2021 FINDINGS: Right-sided central venous port tip over cavoatrial region. Electronic recording device over the left lower chest. Right lower chest drainage catheter, tip non included. Moderate to large right-sided pneumothorax, is decreased compared to radiograph earlier today. Residual right pleural effusion. Moderate left pleural effusion with airspace disease at left base. Stable cardiomediastinal silhouette with vascular congestion and probable edema. Redemonstrated pulmonary nodules consistent with metastatic disease. IMPRESSION: 1. Right lower chest drainage catheter, tip is non included. Residual moderate to large right pneumothorax but decreased as compared with radiograph from earlier today. No midline shift 2. Moderate left pleural effusion with airspace disease at left base. Suspicion of vascular congestion and mild edema. Multiple pulmonary nodules consistent with metastatic disease. Electronically  Signed   By: Donavan Foil M.D.   On: 03/08/2021 18:05    Disposition:     There are no questions and answers to display.         Allergies as of 03/10/2021       Reactions   Bee Venom Swelling   Lip swelling        Medication List     STOP taking these medications    acetaminophen 500 MG tablet Commonly known as: TYLENOL   cephALEXin 500 MG capsule Commonly known as: KEFLEX       TAKE these medications    albuterol 108 (90 Base) MCG/ACT inhaler Commonly known as: VENTOLIN HFA Inhale 2 puffs into the lungs every 6 (six) hours as needed for wheezing or shortness of breath.   apixaban 5 MG Tabs tablet Commonly known as: ELIQUIS Take 1 tablet (5 mg total) by mouth 2 (two) times daily.   atorvastatin 40 MG tablet Commonly known as: LIPITOR TAKE ONE TABLET BY MOUTH ONE TIME DAILY 6PM   diltiazem 180 MG 24 hr capsule Commonly known as: CARDIZEM CD Take 1 capsule (180 mg total) by mouth daily. appt needed for refill [id number]   Fish Oil 1200 MG Caps Take 1,200 mg by mouth in the morning.   glycopyrrolate 1 MG tablet Commonly  known as: Robinul Take 1 tablet (1 mg total) by mouth 3 (three) times daily as needed (excessive oral secreations).   HYDROcodone bit-homatropine 5-1.5 MG/5ML syrup Commonly known as: HYCODAN Take 5 mLs by mouth every 6 (six) hours as needed for cough.   lidocaine-prilocaine cream Commonly known as: EMLA Apply to the Port-A-Cath site 30-60-minute before treatment   LORazepam 1 MG tablet Commonly known as: ATIVAN Take 1 tablet (1 mg total) by mouth every 4 (four) hours as needed for anxiety.   MELATONIN PO Take 5 mg by mouth at bedtime as needed (sleep).   metFORMIN 500 MG tablet Commonly known as: GLUCOPHAGE TAKE TWO TABLETS BY MOUTH TWICE A DAY WITH MEAL(S) What changed:  how much to take how to take this when to take this additional instructions   morphine CONCENTRATE 10 mg / 0.5 ml concentrated solution Take 0.25  mLs (5 mg total) by mouth every 2 (two) hours as needed for up to 5 days for severe pain.   prochlorperazine 10 MG tablet Commonly known as: COMPAZINE Take 1 tablet (10 mg total) by mouth every 6 (six) hours as needed. What changed: reasons to take this   ZzzQuil 25 MG Caps Generic drug: diphenhydrAMINE HCl (Sleep) Take 25 mg by mouth at bedtime.               Durable Medical Equipment  (From admission, onward)           Start     Ordered   03/09/21 1359  For home use only DME oxygen  Once       Question Answer Comment  Length of Need Lifetime   Mode or (Route) Nasal cannula   Liters per Minute 2   Frequency Continuous (stationary and portable oxygen unit needed)   Oxygen delivery system Gas      03/09/21 1358             Follow-up appointment   None  Discharge Condition:    fair   Signed: Michaeleen Down D. Kenton Kingfisher, NP-C [facility] Personal contact information can be found on Amion  03/10/2021, 9:51 AM

## 2021-03-10 NOTE — Progress Notes (Signed)
[facility] ([facility])  Plans to dc home today to begin hospice services.  Chest tube switched out to Express Mini per CCM.  DME ordered STAT to include, O2, 3n1. Portable tank will be delivered to home and wife has been instructed to bring the tank with her as Mr. Zeiss is adamant he transport home by car.  Hospice RN to be at the home by 3 pm. Wife aware of time.  Thank you, Venia Carbon BSN, RN [facility] Liaison

## 2021-03-14 ENCOUNTER — Encounter (HOSPITAL_BASED_OUTPATIENT_CLINIC_OR_DEPARTMENT_OTHER): Payer: Medicare HMO | Admitting: Internal Medicine

## 2021-03-20 ENCOUNTER — Ambulatory Visit (HOSPITAL_COMMUNITY): Payer: Medicare HMO

## 2021-03-21 ENCOUNTER — Telehealth: Payer: Self-pay | Admitting: Family Medicine

## 2021-03-21 NOTE — Telephone Encounter (Signed)
Called and left message on his wife's cell phone.  Expressed my condolences on the loss of her husband.  I left my cell phone number in case she needs any assistance as far as the death certificate, I am not sure what day he actually passed away and if this has been taken care of

## 2021-03-22 ENCOUNTER — Inpatient Hospital Stay: Payer: Medicare HMO | Admitting: Internal Medicine

## 2021-03-22 ENCOUNTER — Inpatient Hospital Stay: Payer: Medicare HMO

## 2021-03-22 ENCOUNTER — Inpatient Hospital Stay: Payer: Medicare HMO | Admitting: Dietician

## 2021-03-22 NOTE — Telephone Encounter (Signed)
Patient is deceased and I will close this message

## 2021-03-28 ENCOUNTER — Ambulatory Visit: Payer: Medicare HMO | Admitting: Pulmonary Disease

## 2021-03-29 ENCOUNTER — Inpatient Hospital Stay: Payer: Medicare HMO

## 2021-04-02 DEATH — deceased

## 2021-04-12 ENCOUNTER — Ambulatory Visit: Payer: Medicare HMO

## 2021-04-12 ENCOUNTER — Ambulatory Visit: Payer: Medicare HMO | Admitting: Internal Medicine

## 2021-04-12 ENCOUNTER — Other Ambulatory Visit: Payer: Medicare HMO

## 2021-04-19 ENCOUNTER — Other Ambulatory Visit: Payer: Medicare HMO

## 2021-04-19 ENCOUNTER — Ambulatory Visit: Payer: Medicare HMO

## 2021-05-03 ENCOUNTER — Ambulatory Visit: Payer: Medicare HMO

## 2021-05-03 ENCOUNTER — Other Ambulatory Visit: Payer: Medicare HMO

## 2021-05-03 ENCOUNTER — Ambulatory Visit: Payer: Medicare HMO | Admitting: Internal Medicine

## 2021-05-10 ENCOUNTER — Other Ambulatory Visit: Payer: Medicare HMO

## 2021-05-10 ENCOUNTER — Ambulatory Visit: Payer: Medicare HMO

## 2021-08-21 ENCOUNTER — Ambulatory Visit: Payer: Medicare HMO | Admitting: Family Medicine

## 2022-02-22 IMAGING — MR MR HEAD WO/W CM
15 series · 48 of 48 positions shown · IV contrast (gadavist)
Comparison: 09/18/2019

CLINICAL DATA: Lung cancer staging.  Right eyelid drooping

EXAM:
MRI HEAD WITHOUT AND WITH CONTRAST
TECHNIQUE: Multiplanar, multiecho pulse sequences of the brain and surrounding
structures were obtained without and with intravenous contrast.
CONTRAST:  8mL GADAVIST GADOBUTROL 1 MMOL/ML IV SOLN

[Series 5: DWI · axial · 3.0mm · 1.36mm/px · z∈[-47,+112]mm · 4 of 108 slices shown (1 of 2)]
[im 1/108]
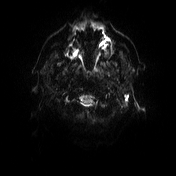
[im 36/108]
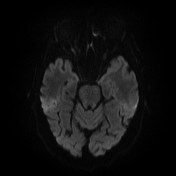
[im 72/108]
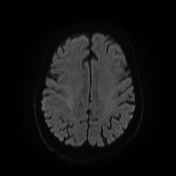
[im 108/108]
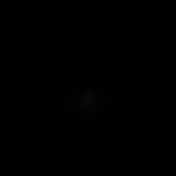

[Series 6: DWI · axial · 3.0mm · 1.36mm/px · z∈[-47,+112]mm · 3 of 54 slices shown (2 of 2)]
[im 1/54]
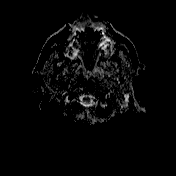
[im 27/54]
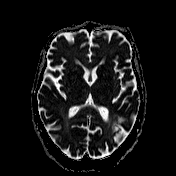
[im 54/54]
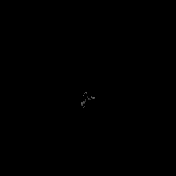

[Series 7: T1 · sagittal · 5.0mm · 0.75mm/px · 1 of 24 slices shown (1 of 2)]
[im 1/24]
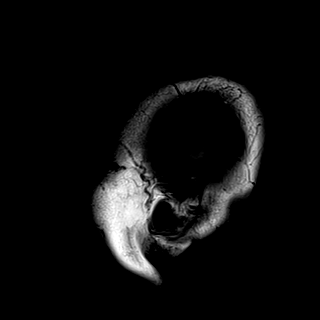

[Series 8: T2 · axial · 5.0mm · 0.62mm/px · 1 of 26 slices shown]
[im 1/26]
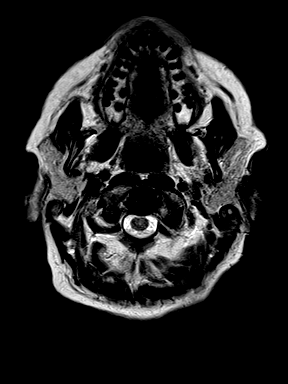

[Series 9: mip_images(sw) · axial · 24.0mm · 0.75mm/px · z∈[-48,+108]mm · 3 of 53 slices shown]
[im 1/53]
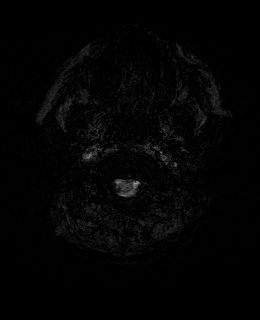
[im 27/53]
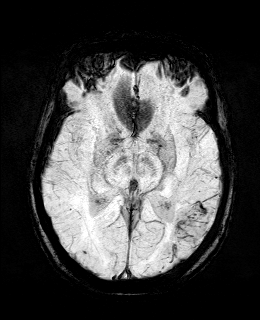
[im 53/53]
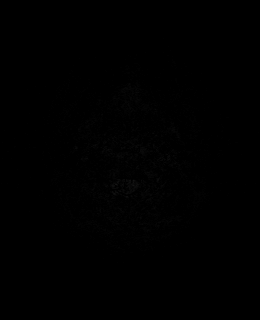

[Series 10: swi_images · axial · 3.0mm · 0.75mm/px · z∈[-58,+118]mm · 3 of 60 slices shown]
[im 1/60]
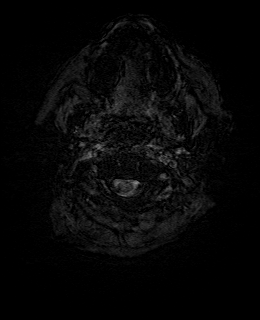
[im 30/60]
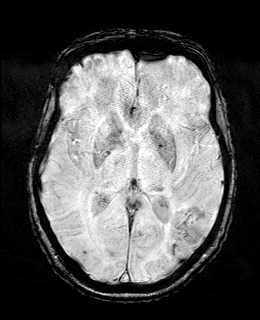
[im 60/60]
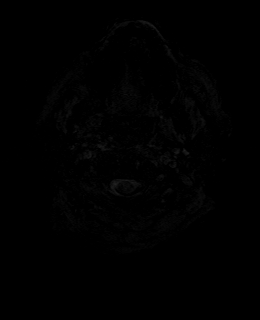

[Series 11: FLAIR · axial · 3.0mm · 0.75mm/px · z∈[-51,+111]mm · 3 of 55 slices shown]
[im 1/55]
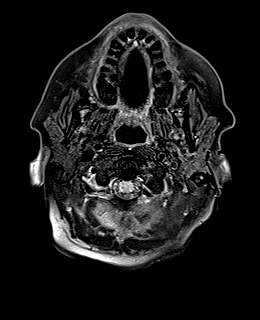
[im 28/55]
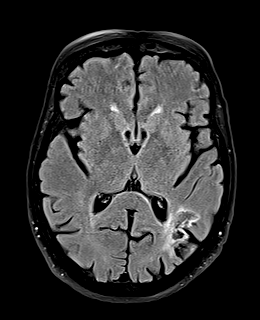
[im 55/55]
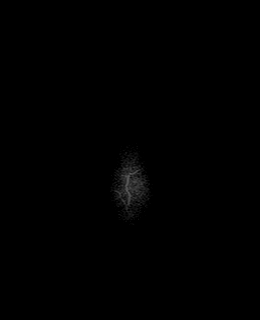

[Series 12: T1 · axial · 1.0mm · 0.94mm/px · z∈[-58,+117]mm · 9 of 176 slices shown (2 of 2)]
[im 1/176]
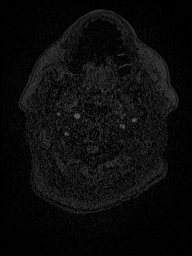
[im 22/176]
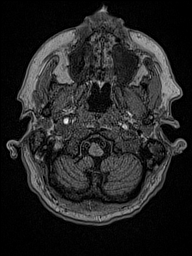
[im 44/176]
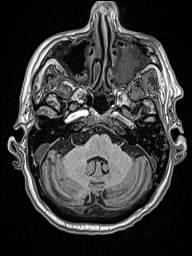
[im 66/176]
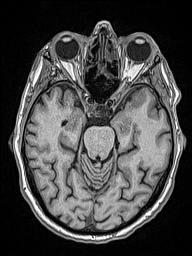
[im 88/176]
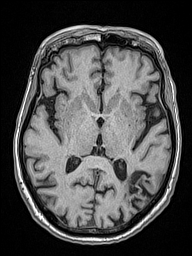
[im 110/176]
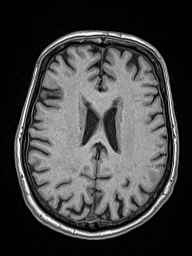
[im 132/176]
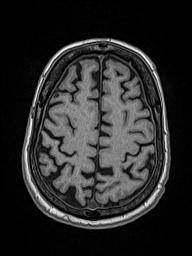
[im 154/176]
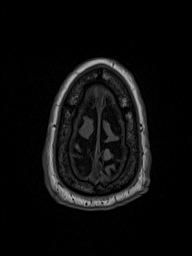
[im 176/176]
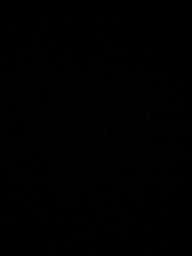

[Series 13: cor dwi_tracew · coronal · 5.0mm · 1.53mm/px · 3 of 54 slices shown]
[im 1/54]
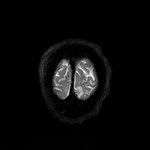
[im 27/54]
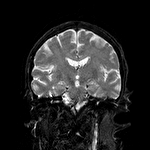
[im 54/54]
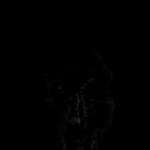

[Series 14: cor dwi_adc · coronal · 5.0mm · 1.53mm/px · 1 of 27 slices shown]
[im 1/27]
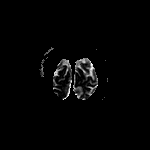

[Series 15: T2 post-contrast · coronal · 5.0mm · 0.57mm/px · 2 of 32 slices shown]
[im 1/32]
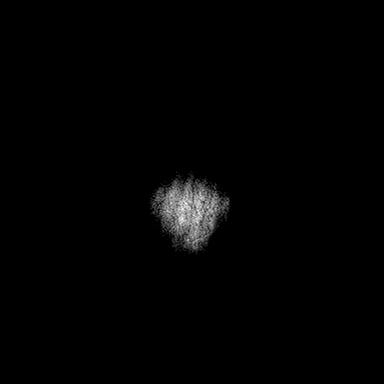
[im 32/32]
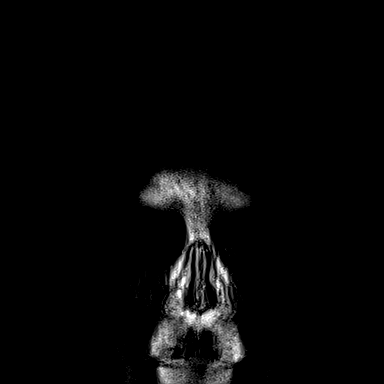

[Series 16: T1 post-contrast · axial · 1.0mm · 0.94mm/px · z∈[-58,+117]mm · 9 of 176 slices shown (1 of 4)]
[im 1/176]
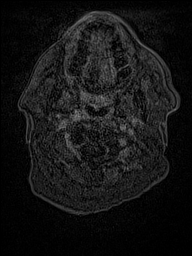
[im 22/176]
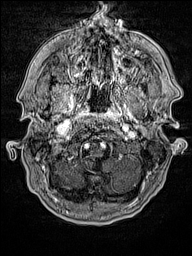
[im 44/176]
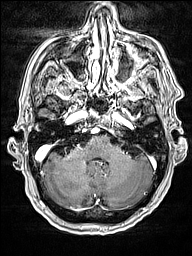
[im 66/176]
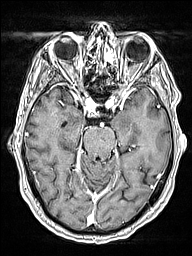
[im 88/176]
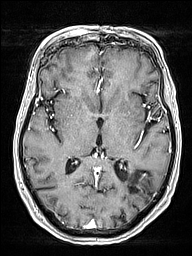
[im 110/176]
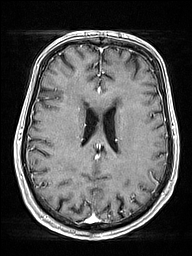
[im 132/176]
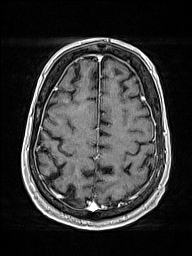
[im 154/176]
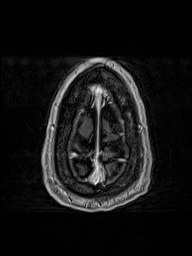
[im 176/176]
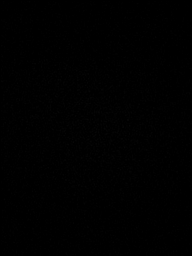

[Series 17: T1 post-contrast · coronal · 5.0mm · 0.43mm/px · 2 of 32 slices shown (2 of 4)]
[im 1/32]
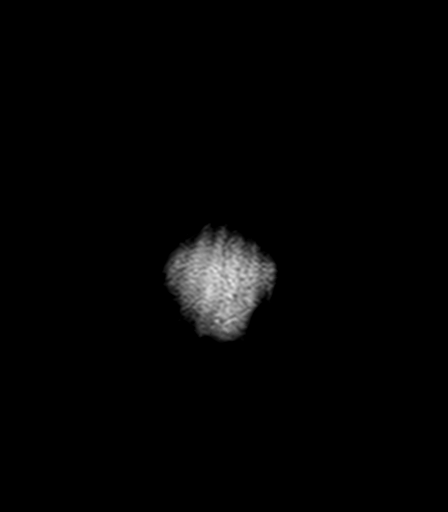
[im 32/32]
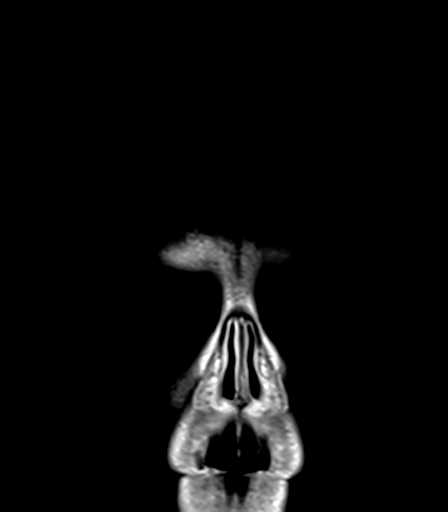

[Series 18: T1 post-contrast · sagittal · 5.0mm · 0.75mm/px · 1 of 24 slices shown (3 of 4)]
[im 1/24]
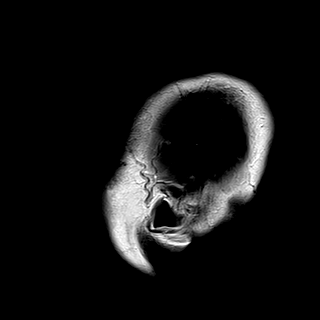

[Series 19: T1 post-contrast · axial · 3.0mm · 0.45mm/px · z∈[-52,+110]mm · 3 of 55 slices shown (4 of 4)]
[im 1/55]
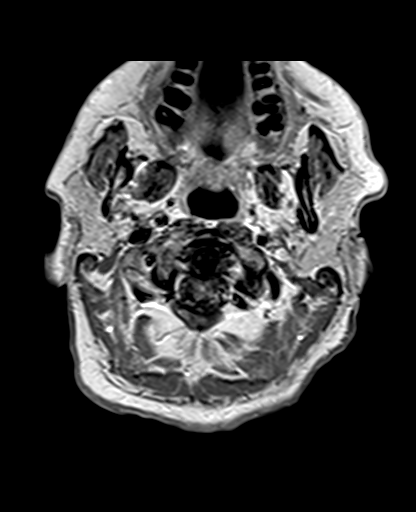
[im 28/55]
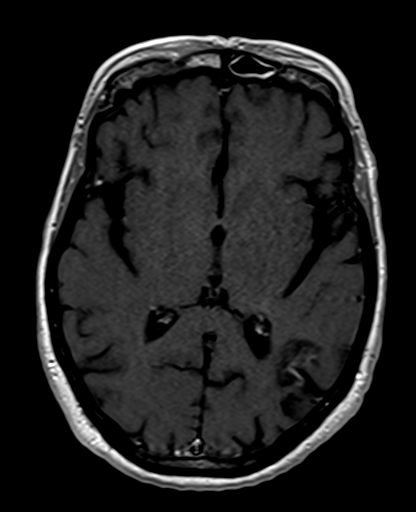
[im 55/55]
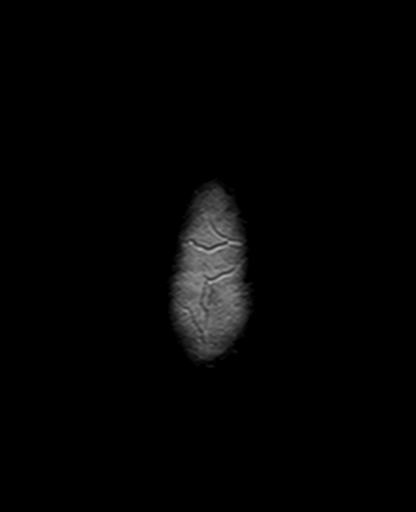

[48 of 48 positions shown; findings below may reference images not displayed]

FINDINGS: Brain: Ovoid area of weakly restricted diffusion in the right
centrum semiovale with low-grade enhancement, most consistent with
subacute infarct. Expected evolution of left cerebral infarct
centered at the posterior temporal to parietal cortex. Small remote
infarcts in the right occipital cortex, left temporal occipital
cortex, and right cerebellum.

Concerning eyelid drooping-no visible skull base mass, cisternal
lesion, cavernous sinus lesion, or pontine disease.

No hydrocephalus or collection

Vascular: Normal flow voids and vascular enhancement

Skull and upper cervical spine: No focal marrow lesion

Sinuses/Orbits: New bilateral sinusitis with mucosal thickening and
fluid in the left more than right maxillary sinuses. The bilateral
ethmoids and inferior frontal sinuses are also affected. Bilateral
mastoid opacification which is new on the left and progressed on the
right.
IMPRESSION: 1. 1 cm abnormality in the right centrum semiovale most consistent
with subacute infarct. In the setting of active malignancy recommend
follow-up in 2-3 months to exclude solitary metastasis.
2. Prominent bilateral sinusitis since 8787. Also new/progressed is
mastoid opacification.
3. Chronic infra and supratentorial infarcts which were also seen in
